# Patient Record
Sex: Female | Born: 1970 | Hispanic: Yes | Marital: Married | Smoking: Never smoker
Health system: Southern US, Community
[De-identification: ages and names within clinical notes are randomized; demographics above are authoritative.]

## PROBLEM LIST (undated history)

## (undated) DIAGNOSIS — R569 Unspecified convulsions: Secondary | ICD-10-CM

## (undated) DIAGNOSIS — C719 Malignant neoplasm of brain, unspecified: Secondary | ICD-10-CM

## (undated) DIAGNOSIS — I313 Pericardial effusion (noninflammatory): Secondary | ICD-10-CM

## (undated) DIAGNOSIS — C50919 Malignant neoplasm of unspecified site of unspecified female breast: Secondary | ICD-10-CM

## (undated) DIAGNOSIS — T8859XA Other complications of anesthesia, initial encounter: Secondary | ICD-10-CM

## (undated) DIAGNOSIS — T4145XA Adverse effect of unspecified anesthetic, initial encounter: Secondary | ICD-10-CM

## (undated) HISTORY — PX: BRAIN SURGERY: SHX531

## (undated) HISTORY — DX: Unspecified convulsions: R56.9

## (undated) HISTORY — DX: Malignant neoplasm of brain, unspecified: C71.9

## (undated) HISTORY — DX: Malignant neoplasm of unspecified site of unspecified female breast: C50.919

---

## 2007-03-21 DIAGNOSIS — C50919 Malignant neoplasm of unspecified site of unspecified female breast: Secondary | ICD-10-CM

## 2007-03-21 HISTORY — DX: Malignant neoplasm of unspecified site of unspecified female breast: C50.919

## 2008-03-20 HISTORY — PX: MASTECTOMY: SHX3

## 2010-03-20 DIAGNOSIS — C719 Malignant neoplasm of brain, unspecified: Secondary | ICD-10-CM

## 2010-03-20 HISTORY — DX: Malignant neoplasm of brain, unspecified: C71.9

## 2011-03-01 DIAGNOSIS — C7931 Secondary malignant neoplasm of brain: Secondary | ICD-10-CM | POA: Insufficient documentation

## 2014-06-25 DIAGNOSIS — C50919 Malignant neoplasm of unspecified site of unspecified female breast: Secondary | ICD-10-CM | POA: Insufficient documentation

## 2014-08-25 DIAGNOSIS — G939 Disorder of brain, unspecified: Secondary | ICD-10-CM | POA: Insufficient documentation

## 2014-10-27 DIAGNOSIS — Z8742 Personal history of other diseases of the female genital tract: Secondary | ICD-10-CM | POA: Insufficient documentation

## 2014-12-18 DIAGNOSIS — Z4001 Encounter for prophylactic removal of breast: Secondary | ICD-10-CM | POA: Insufficient documentation

## 2015-02-03 DIAGNOSIS — H5203 Hypermetropia, bilateral: Secondary | ICD-10-CM | POA: Insufficient documentation

## 2015-02-03 DIAGNOSIS — H524 Presbyopia: Secondary | ICD-10-CM | POA: Insufficient documentation

## 2015-02-03 DIAGNOSIS — H02889 Meibomian gland dysfunction of unspecified eye, unspecified eyelid: Secondary | ICD-10-CM | POA: Insufficient documentation

## 2015-02-03 DIAGNOSIS — H01009 Unspecified blepharitis unspecified eye, unspecified eyelid: Secondary | ICD-10-CM | POA: Insufficient documentation

## 2015-02-03 DIAGNOSIS — H02431 Paralytic ptosis of right eyelid: Secondary | ICD-10-CM | POA: Insufficient documentation

## 2015-03-31 DIAGNOSIS — M25512 Pain in left shoulder: Secondary | ICD-10-CM | POA: Diagnosis not present

## 2015-03-31 DIAGNOSIS — M542 Cervicalgia: Secondary | ICD-10-CM | POA: Diagnosis not present

## 2015-04-16 ENCOUNTER — Encounter: Payer: Self-pay | Admitting: Hematology and Oncology

## 2015-04-16 ENCOUNTER — Inpatient Hospital Stay: Payer: Medicare HMO

## 2015-04-16 ENCOUNTER — Inpatient Hospital Stay: Payer: Medicare HMO | Attending: Hematology and Oncology | Admitting: Hematology and Oncology

## 2015-04-16 ENCOUNTER — Other Ambulatory Visit: Payer: Self-pay

## 2015-04-16 DIAGNOSIS — M542 Cervicalgia: Secondary | ICD-10-CM | POA: Diagnosis not present

## 2015-04-16 DIAGNOSIS — Z79899 Other long term (current) drug therapy: Secondary | ICD-10-CM | POA: Insufficient documentation

## 2015-04-16 DIAGNOSIS — M25552 Pain in left hip: Secondary | ICD-10-CM | POA: Insufficient documentation

## 2015-04-16 DIAGNOSIS — M25551 Pain in right hip: Secondary | ICD-10-CM | POA: Diagnosis not present

## 2015-04-16 DIAGNOSIS — Z170 Estrogen receptor positive status [ER+]: Secondary | ICD-10-CM | POA: Insufficient documentation

## 2015-04-16 DIAGNOSIS — Z9013 Acquired absence of bilateral breasts and nipples: Secondary | ICD-10-CM | POA: Insufficient documentation

## 2015-04-16 DIAGNOSIS — C779 Secondary and unspecified malignant neoplasm of lymph node, unspecified: Secondary | ICD-10-CM | POA: Diagnosis not present

## 2015-04-16 DIAGNOSIS — C50911 Malignant neoplasm of unspecified site of right female breast: Secondary | ICD-10-CM

## 2015-04-16 DIAGNOSIS — M898X9 Other specified disorders of bone, unspecified site: Secondary | ICD-10-CM

## 2015-04-16 DIAGNOSIS — C7931 Secondary malignant neoplasm of brain: Secondary | ICD-10-CM | POA: Diagnosis not present

## 2015-04-16 DIAGNOSIS — C770 Secondary and unspecified malignant neoplasm of lymph nodes of head, face and neck: Secondary | ICD-10-CM

## 2015-04-16 DIAGNOSIS — R599 Enlarged lymph nodes, unspecified: Secondary | ICD-10-CM | POA: Insufficient documentation

## 2015-04-16 DIAGNOSIS — D0512 Intraductal carcinoma in situ of left breast: Secondary | ICD-10-CM | POA: Diagnosis not present

## 2015-04-16 DIAGNOSIS — G893 Neoplasm related pain (acute) (chronic): Secondary | ICD-10-CM | POA: Insufficient documentation

## 2015-04-16 LAB — COMPREHENSIVE METABOLIC PANEL
ALT: 17 U/L (ref 14–54)
AST: 15 U/L (ref 15–41)
Albumin: 3.8 g/dL (ref 3.5–5.0)
Alkaline Phosphatase: 119 U/L (ref 38–126)
Anion gap: 8 (ref 5–15)
BUN: 20 mg/dL (ref 6–20)
CO2: 25 mmol/L (ref 22–32)
Calcium: 9.3 mg/dL (ref 8.9–10.3)
Chloride: 99 mmol/L — ABNORMAL LOW (ref 101–111)
Creatinine, Ser: 0.62 mg/dL (ref 0.44–1.00)
GFR calc Af Amer: >60 mL/min (ref >60)
GFR calc non Af Amer: >60 mL/min (ref >60)
Glucose, Bld: 110 mg/dL — ABNORMAL HIGH (ref 65–99)
Potassium: 3.3 mmol/L — ABNORMAL LOW (ref 3.5–5.1)
Sodium: 132 mmol/L — ABNORMAL LOW (ref 135–145)
Total Bilirubin: 0.4 mg/dL (ref 0.3–1.2)
Total Protein: 8.2 g/dL — ABNORMAL HIGH (ref 6.5–8.1)

## 2015-04-16 LAB — CBC WITH DIFFERENTIAL/PLATELET
Basophils Absolute: 0.2 10*3/uL — ABNORMAL HIGH (ref 0–0.1)
Basophils Relative: 1 %
Eosinophils Absolute: 0 10*3/uL (ref 0–0.7)
Eosinophils Relative: 0 %
HCT: 37.1 % (ref 35.0–47.0)
Hemoglobin: 12.3 g/dL (ref 12.0–16.0)
Lymphocytes Relative: 6 %
Lymphs Abs: 1.3 10*3/uL (ref 1.0–3.6)
MCH: 28.3 pg (ref 26.0–34.0)
MCHC: 33 g/dL (ref 32.0–36.0)
MCV: 85.6 fL (ref 80.0–100.0)
Monocytes Absolute: 0.5 10*3/uL (ref 0.2–0.9)
Monocytes Relative: 2 %
Neutro Abs: 18.9 10*3/uL — ABNORMAL HIGH (ref 1.4–6.5)
Neutrophils Relative %: 91 %
Platelets: 445 10*3/uL — ABNORMAL HIGH (ref 150–440)
RBC: 4.33 MIL/uL (ref 3.80–5.20)
RDW: 13.2 % (ref 11.5–14.5)
WBC: 20.9 10*3/uL — ABNORMAL HIGH (ref 3.6–11.0)

## 2015-04-16 MED ORDER — PREDNISONE 20 MG PO TABS: 20.0000 mg | ORAL_TABLET | Freq: Every day | ORAL | Status: DC

## 2015-04-16 MED ORDER — TRAMADOL HCL 50 MG PO TABS: 50.0000 mg | ORAL_TABLET | Freq: Two times a day (BID) | ORAL | Status: DC | PRN

## 2015-04-16 MED ORDER — OMEPRAZOLE 20 MG PO CPDR: 20.0000 mg | DELAYED_RELEASE_CAPSULE | Freq: Every day | ORAL | Status: DC

## 2015-04-17 LAB — CANCER ANTIGEN 27.29: CA 27.29: 177.8 U/mL — ABNORMAL HIGH (ref 0.0–38.6)

## 2015-04-17 NOTE — Progress Notes (Signed)
[facility] day:  04/16/2015  Chief Complaint: Melinda Tanner is a 45 y.o. 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  HPI:  The patient presented with a lump in her right breast in 2009 while living in [place].  Mammogram and ultrasound on  08/15/2007 showed abnormality inn both breasts.  There were solid lesions at 6:00 (1.2x1.4x1.1cm), 7:00 (1.8x1x1.8cm), and 9:00 (2.1x1.9x2.2cm) in the right breast.  There was a solid lesion at 2:00 position left breast (0.5x0.5cmx0.4cm).  She underwent right breast biopsy on 09/03/2007 of the 9:00 lesion.  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   Breast MRI on 09/10/2007 revealed 3 lesions in the right breast measuring 2.1x3.2 cm, 2.8x2.7 cm, and 1.8x1.6 cm.  In the left breast, there was clumped enhancement centrally, in the mid portion of the breast, suspicious for malignancy.  She underwent left breast left breast MRI-guided biopsy on 10/02/2007 and 10/23/2007.  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  Per report, biopsies done of additional abnormal sites of the right breast showing invasive carcinoma (ER+/PR+ with Her2-). Right axillary lymph node was positive for metastatic disease.  She received  neoadjuvant Adriamycin and Cytoxan (AC) followed by Taxol and Carboplatin.  She had a severe Taxol infusion reaction and thus was switched to Abraxane.  On 07/14/2008, she underwent bilateral mastectomies followed by reconstructions.  Pathology in the right breast revealed a 1.7 cm grade III invasive ductal carcinoma grade III/III.   Zero of 11 lymph nodes on the right were positive for malignancy. 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 Deep margin was negative. Zero of 2 lymph nodes were positive for metastatic disease.  She received adjuvant radiation to  the right breast only.  She began adjuvant Tamoxifen and Zoladex.  Per patient, she could not tolerate symptoms of joint pain.  Tamoxifen was discontinued after 1-2 months.  Zoladex was continued for approximately 1.5 years.  She presented on 03/01/2011 with headaches.  Head MRI revealed metastatic disease with lesions in the left frontal area 2.4x1.7x1.8cm and right frontal area 2.2x1.8x1.7cm.   She underwent left frontal and right parietal craniotomies and excision of these lesions. Pathology of the left and right frontal lesions revealed metastatic adenocarcinoma. ER was >90%, PR5%, HER2 negative (Hercept Test DAKO).  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  Each lesion received 1600cGy.  On 03/09/2011, original breast tissue (09/03/2007) sent to [facility] testing.  Testing revealed ER+, PR+(borderline), and HER2/neu positive (different than initial testing).  She received Herceptin for 1 year beginning in 2013.  She then began Tykerb and Xeloda after completion of Herceptin (2014?).  Head MRI on 07/22/2013 was worrisome for disease progression in the right frontal lobe (1.0 x 1.1 x1.2cm).  The central potion of the lesion and surrounding T2/FLAIR hyperintensity had increased in size.  Head MRI on 01/21/2014 revealed the right frontal enhancing lesion had  increased in size with surrounding edema and local mass effect (measured 1.8 x 2.1 x 1.6 cm)  She noted neck pain.  Imaging revealed a right right supraclavicular lymph node. Biopsy on 08/06/2013 confirmed metastatic carcinoma of the right supraclavicular lymph node.  PET scan on 01/19/2014 revealed right paratracheal node 0.9 x 0.5cm (SUV 4.6) and right supraclavicular node 6x58m (SUV 3.5).  [facility] and stopped taking Tykerb and Xeloda (dosing  sub-therapeutic and probably ineffective).  Head MRI on 07/16/2014 noted the right anterior frontal enhancing mass was 2.5 x 2.0 cm  compatible with a metastatic lesion which had increased in size and had changed configuration from previous 2.0 x 1.8 cm, formerly multilobular.  On 09/04/2014, she underwent craniotomy with resection of right frontal tumor (metastatic breast cancer).  Tumor was ER 90-100%, PR 51-60%, and HER2/neu -.  On 09/29/2014 a right cervical lymph node was positive for metastatic carcinoma consistent with breast primary.  Tumor was ER 100%, PR 20%, and HER2.neu-.  She started tamoxifen in 09/2014.  She stopped tamoxifen in early 02/2015.  She was seen by Dr. Boykin Reaper in the [facility] on 03/09/2015.  She noted acute pain in her left hip that radiated to her lower back and right hip. She also noted similar pain in the right upper extremity/shoulder. She denied injury. She attributed this to Tamoxifen, as she had similar pain and discomfort when taking Tamoxifen in the past. She felt well otherwise.  She cancelled her planned oophorectomy due to insurance issues and cost.  She was seen in the ER in 03/2015 with left shoulder and arm pain.  An etiology was not found.  She received hydrocodone with Tylenol (did not help).  She has been prescribed prednisone 20 mg a day which helps.  She subsequently moved from [place] to [place].  She notes that last year there was consideration of bilateral oophorectomy.  She has a follow-up appointment with neurosurgery in 05/2015.  Symptomatically, she has excruciating pain in her hips and shoulders.  She denies any headache or neurologic dysfunnction   Past Medical History  Diagnosis Date  . Breast cancer ([place]) 2009  . Seizures ([place])   . Brain cancer ([place]) 2012    Met. from Breast    Past Surgical History  Procedure Laterality Date  . Mastectomy Bilateral 2010  . Brain surgery  2012, 2106    Family History  Problem Relation Age of Onset  . Cancer Paternal Aunt   . Cancer Paternal Uncle   . Cancer Paternal Grandfather   A paternal aunt had  breast cancer age 43, a paternal uncle had prostate cancer, and a paternal grandfather had leukemia.  Social History:  reports that she has never smoked. She does not have any smokeless tobacco history on file. She reports that she does not drink alcohol or use illicit drugs.  She is from [place].  She moved to [place] in 2015.  She moved to [place] in 04/2014.  She recently moved into the [place] area.  She has 2 children (boy and girl).  The patient is accompanied by her husband and the Spanish interpreter today.  Allergies:  Allergies  Allergen Reactions  . Taxol [Paclitaxel] Anaphylaxis  . Aspirin Swelling  . Penicillins Swelling  . Zofran [Ondansetron Hcl] Nausea And Vomiting    Current Medications: Current Outpatient Prescriptions  Medication Sig Dispense Refill  . omeprazole (PRILOSEC) 20 MG capsule Take 1 capsule (20 mg total) by mouth daily. 30 capsule 2  . potassium chloride (K-DUR) 10 MEQ tablet Take 1 tablet (10 mEq total) by mouth 2 (two) times daily. 6 tablet 0  . predniSONE (DELTASONE) 20 MG tablet Take 1 tablet (20 mg total) by mouth daily with breakfast. PRN pain 10 tablet 0  . traMADol (ULTRAM) 50 MG tablet Take 1 tablet (50 mg total) by mouth every 12 (twelve) hours as needed for severe pain. 30 tablet 0  No current facility-administered medications for this visit.    Review of Systems:  GENERAL:  Feels "ok".  No fevers, sweats or weight loss. PERFORMANCE STATUS (ECOG):  1 HEENT:  No visual changes, runny nose, sore throat, mouth sores or tenderness. Lungs: No shortness of breath or cough.  No hemoptysis. Cardiac:  No chest pain, palpitations, orthopnea, or PND. GI:  No nausea, vomiting, diarrhea, constipation, melena or hematochezia. GU:  No urgency, frequency, dysuria, or hematuria. Musculoskeletal:  Bone pain.  No muscle tenderness. Extremities:  No pain or swelling. Skin:  No rashes or skin changes. Neuro:  No headache, numbness or weakness,  balance or coordination issues. Endocrine:  No diabetes, thyroid issues, hot flashes or night sweats. Psych:  No mood changes, depression or anxiety. Pain:  No focal pain. Review of systems:  All other systems reviewed and found to be negative.  Physical Exam: There were no vitals taken for this visit. GENERAL:  Well developed, well nourished, sitting comfortably in the exam room in no acute distress.  She is tearful at times. MENTAL STATUS:  Alert and oriented to person, place and time. HEAD: Wearing a cap.  Long dark hair.  Normocephalic, atraumatic, face symmetric, no Cushingoid features. EYES:  Brown eyes.  Pupils equal round and reactive to light and accomodation.  No conjunctivitis or scleral icterus. ENT:  Oropharynx clear without lesion.  Tongue normal. Mucous membranes moist.  RESPIRATORY:  Clear to auscultation without rales, wheezes or rhonchi. CARDIOVASCULAR:  Regular rate and rhythm without murmur, rub or gallop. ABDOMEN:  Soft, non-tender, with active bowel sounds, and no hepatosplenomegaly.  No masses. BREAST:  s/p bilateral mastectomy with reconstruction.  No masses, skin changes or nipple discharge. SKIN:  No rashes, ulcers or lesions. EXTREMITIES: No edema, no skin discoloration or tenderness.  No palpable cords. LYMPH NODES: Pea sized low right cervical node.  No palpable supraclavicular, axillary or inguinal adenopathy  NEUROLOGICAL: Alert & oriented, cranial nerves II-XII intact; motor strength 5/5 throughout; sensation intact; finger to nose and RAM normal; able to walk heel to toe; negative Rhomberg; no clonus. PSYCH:  Appropriate.   Pathology: 09/03/2007: Right breast core biopsy: infiltrating duct cell carcinoma high grade ER/PR (reportedly positive) Her2 (?) 10/02/2007: Right axillary node core biopsy: Fragment with metastatic carcinoma 10/23/2007: Left breast core biopsy: DCIS high grade ER/PR (?) 07/13/2008: Right mastectomy: Invasive ductal carcinoma Grade III  Nottingham score = tubules 3+ Nuclei 3+ mitoses 2+) 1.7 cm size. No lymph invasion. Tumor cellularity 20%. [id number]. 07/13/2008: Left mastectomy: Residual ductal carcinoma in situ, high nuclear grade, deep margin negative ypTisNO(sn)MX. 10/25/2010: Abdominal and pelvic ultrasound: questionable lesion near gallbladder fossa recommend MRI follow up. 03/02/2011: Left and right frontal excisional brain biopsy: Adenocarcinoma, metastatic, NOS: ER +, PR 5% +, Her 2 NEG. 03/04/2011: [facility] testing of IHC4 residual recurrence risk score of 114 showing an 8 year recurrence reate of 57%. ER POSTIVE, PR POSITIVE, HER 2 POSTIVE (previously documented negative in 2009) cutoff of 361 patient scores 426. ki67 71%.  08/05/2013: Right breast FNA supraclavicular region: positive for metastatic carcinoma. ER/PR/HER2 not reported. 08/19/2014: Right cervical lymph node. Adenocarcinoma with features consistent with metastatic breast carcinoma. ER/PR/HER2 insufficient tissue. 09/04/2014: Repeat craniotomy, resection of right frontal tumor (metastatic breast cancer). ER 90-100% PR 51-60% HER2 - 09/29/2014: Right cervical lymph node, metastatic carcinoma c/w breast primary, ER 100% PR 20% HER2-  Imaging: 08/15/2007: Bilateral Mammogram: Dense breasts with solid lesion at 6:00, 7:00, and 9:00 of right breast, solid lesion at 2:00  position left breast BIRADS 4B. 09/10/2007: Breast MRI: Three solid irregular enhancing lesions of the right breast 2.1 x 3.2, 2.8 x 2.7, and 1.8 x 1.6 cm. Mildly enlarged enhancing nodule right axilla. Left breast clumped enhancement midportion suspicious for malignancy. 03/01/2011: Brain MRI: At least two juxtacortical, intra-axial masses with extensive surrounding vasogenic edema most consistent with intracranial metastasis. 07/22/2013: Brain MRI +/- contrast: interval increase in enhancing component on T2 FLAIR now measuring 1.0cm x 1.1 cm worrisome for progression of disease. 01/21/2014:  PET  scan: Hypermetabolic small right supraclavicular and right upper paratracheal lymph nodes suspicious for mets. Hypermetabolic lymph node in the right paratracheal upper mediastinum that measure approximately 0.9 x 0.5cm. (of note no impressions made of areas noted on brain MRI 07/22/13).  01/21/2014 MRI Brain: Right frontal enhancing lesion has shown interval increase in size with surrounding edema and local mass effect (measured 1.8x2.1x1.6, previously 1.2x1.1x1.0) 07/16/2014 Brain MRI:  Right anterior frontal enhancing mass 2.5 x 2.0 cm compatible with a metastatic lesion has increased and changed in configuration from previous 2.0 x 1.8 cm, formerly multilobular. 08/06/2014 PET scan:  Multiple bilateral FDG avid low cervical, supraclavicular, upper mediastinal, and right internal mammary lymphadenopathy, likely representing metastatic disease. A cervical node in the right lower neck should be amenable to percutaneous sampling.  12/2014 Head MRI:  Evolution of right frontal postoperative changes status post tumor resection at the vertex. Expected postoperative changes. Minimal marginal enhancement resection bed with some adjacent posterior intrinsic T1 signal again seen. Decreasing T2/FLAIR adjacent parenchymal changes.  Stable resection cavity is left posterior frontal and right parieto-occipital regions.  Labs:  Appointment on 04/16/2015  Component Date Value Ref Range Status  . WBC 04/16/2015 20.9* 3.6 - 11.0 K/uL Final  . RBC 04/16/2015 4.33  3.80 - 5.20 MIL/uL Final  . Hemoglobin 04/16/2015 12.3  12.0 - 16.0 g/dL Final  . HCT 04/16/2015 37.1  35.0 - 47.0 % Final  . MCV 04/16/2015 85.6  80.0 - 100.0 fL Final  . MCH 04/16/2015 28.3  26.0 - 34.0 pg Final  . MCHC 04/16/2015 33.0  32.0 - 36.0 g/dL Final  . RDW 04/16/2015 13.2  11.5 - 14.5 % Final  . Platelets 04/16/2015 445* 150 - 440 K/uL Final  . Neutrophils Relative % 04/16/2015 91   Final  . Neutro Abs 04/16/2015 18.9* 1.4 - 6.5 K/uL Final  .  Lymphocytes Relative 04/16/2015 6   Final  . Lymphs Abs 04/16/2015 1.3  1.0 - 3.6 K/uL Final  . Monocytes Relative 04/16/2015 2   Final  . Monocytes Absolute 04/16/2015 0.5  0.2 - 0.9 K/uL Final  . Eosinophils Relative 04/16/2015 0   Final  . Eosinophils Absolute 04/16/2015 0.0  0 - 0.7 K/uL Final  . Basophils Relative 04/16/2015 1   Final  . Basophils Absolute 04/16/2015 0.2* 0 - 0.1 K/uL Final  . Sodium 04/16/2015 132* 135 - 145 mmol/L Final  . Potassium 04/16/2015 3.3* 3.5 - 5.1 mmol/L Final  . Chloride 04/16/2015 99* 101 - 111 mmol/L Final  . CO2 04/16/2015 25  22 - 32 mmol/L Final  . Glucose, Bld 04/16/2015 110* 65 - 99 mg/dL Final  . BUN 04/16/2015 20  6 - 20 mg/dL Final  . Creatinine, Ser 04/16/2015 0.62  0.44 - 1.00 mg/dL Final  . Calcium 04/16/2015 9.3  8.9 - 10.3 mg/dL Final  . Total Protein 04/16/2015 8.2* 6.5 - 8.1 g/dL Final  . Albumin 04/16/2015 3.8  3.5 - 5.0 g/dL Final  .  AST 04/16/2015 15  15 - 41 U/L Final  . ALT 04/16/2015 17  14 - 54 U/L Final  . Alkaline Phosphatase 04/16/2015 119  38 - 126 U/L Final  . Total Bilirubin 04/16/2015 0.4  0.3 - 1.2 mg/dL Final  . GFR calc non Af Amer 04/16/2015 >60  >60 mL/min Final  . GFR calc Af Amer 04/16/2015 >60  >60 mL/min Final   Comment: (NOTE) The eGFR has been calculated using the CKD EPI equation. This calculation has not been validated in all clinical situations. eGFR's persistently <60 mL/min signify possible Chronic Kidney Disease.   . Anion gap 04/16/2015 8  5 - 15 Final  . CA 27.29 04/16/2015 177.8* 0.0 - 38.6 U/mL Final   Comment: (NOTE) [facility]ayer Centaur/ACS methodology Performed At: [facility] [address] [place], [place] [id number] Lindon Romp MD GH:[id number]     Assessment:  Melinda Tanner is a 45 y.o. 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  She initially presented in 2009 while living in [place] with multi-focal right breast cancer with positive lymph  node(s) which was ER+, PR+(low), and HER2/neu+. She received neoadjuvant chemotherapy (AC x 4 every 3 weeks followed by Taxol/Abraxane + carboplatin weekly x 12) without anti-HER2/neu treatment.    On 07/14/2008, she underwent bilateral mastectomies followed by reconstruction.  Pathology in the right breast revealed a 1.7 cm grade III invasive ductal carcinoma grade III/III.   Zero of 11 lymph nodes on the right were positive for malignancy. 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 Deep margin was negative. Zero of 2 lymph nodes were positive for metastatic disease.  She received adjuvant radiation to the right breast.  She received adjuvant tamoxifen and Zoladex.  She could not tolerate symptoms of joint pain and tamoxifen was discontinued after 1-2 months.  Zoladex was continued for approximately 1.5 years.  She was diagnosed with brain metastases in 02/2011.  Pathology revealed ER+,PR+(low), and HER2/neu-.  She underwent resection followed by Cyberknife.  On 03/09/2011, original breast tissue (09/03/2007) sent to [facility] testing.  Testing revealed ER+, PR+(borderline), and HER2/neu positive (different than initial testing).  She received Herceptin for 1 year beginning in 2013.  She then began Tykerb and Xeloda after completion of Herceptin (2014).  She was on extremely low, and probably sub-therapeutic, dosing of lapatinib + capecitabine.   She developed right supraclavicular adenopathy in 2015. She was noted to have slow disease progression in the right frontal/parietal lesion with minimal presence of systemic disease on PET scans. Capecitabine + lapatinib were discontinued in 04/2014.   She underwent repeat craniotomy with resection of brain metastasis on 09/04/2014.  Biopsy of the right supraclavicular lymph node  was ER+,PR+,HER2/neu-.  She started tamoxifen in 09/2014, but discontinued it secondary to pain in hip, back, and shoulder.  Symptomatically, she has excruciating  pain in her hips.  Exam reveals a pea sized low right cervical lymph node.  Neurologic exam is normal.  Plan: 1.  Review entire medical history, diagnosis and management of metastatic breast cancer. 2.  Obtain outside records from [facility] (not available in [place]). 3.  Phone follow-up with Dr Clyda Greener [place] [phone]). 4.  Discuss genetic testing.  Patient considering. 5.  Labs today:  CBC with diff, CMP, CA27.29. 6.  PET scan:  Restaging breast cancer. 7.  Bone scan:  Diffuse bone pain (worse in hips). 8.  Rx:  Prednisone 20 mg a day.  Plan to begin taper  at next visit. 9.  Rx:  Tramadol 50 mg po q 12 hrs prn pain. 10.  RTC after scans for MD assessment and review of work-up.   Lequita Asal, MD  04/16/2015

## 2015-04-20 ENCOUNTER — Ambulatory Visit
Admission: RE | Admit: 2015-04-20 | Discharge: 2015-04-20 | Disposition: A | Discharge status: Home / Self Care | Payer: Medicare HMO | Source: Ambulatory Visit | Attending: Hematology and Oncology | Admitting: Hematology and Oncology

## 2015-04-20 DIAGNOSIS — C771 Secondary and unspecified malignant neoplasm of intrathoracic lymph nodes: Secondary | ICD-10-CM | POA: Diagnosis not present

## 2015-04-20 DIAGNOSIS — C50919 Malignant neoplasm of unspecified site of unspecified female breast: Secondary | ICD-10-CM | POA: Insufficient documentation

## 2015-04-20 DIAGNOSIS — C7951 Secondary malignant neoplasm of bone: Secondary | ICD-10-CM | POA: Diagnosis not present

## 2015-04-20 DIAGNOSIS — N200 Calculus of kidney: Secondary | ICD-10-CM | POA: Diagnosis not present

## 2015-04-20 DIAGNOSIS — Z0189 Encounter for other specified special examinations: Secondary | ICD-10-CM | POA: Diagnosis not present

## 2015-04-20 DIAGNOSIS — C50911 Malignant neoplasm of unspecified site of right female breast: Secondary | ICD-10-CM

## 2015-04-20 LAB — GLUCOSE, CAPILLARY: Glucose-Capillary: 78 mg/dL (ref 65–99)

## 2015-04-20 MED ORDER — FLUDEOXYGLUCOSE F - 18 (FDG) INJECTION
12.3000 | Freq: Once | INTRAVENOUS | Status: AC | PRN
  Administered 2015-04-20: 12.3 via INTRAVENOUS

## 2015-04-21 ENCOUNTER — Telehealth: Payer: Self-pay

## 2015-04-21 ENCOUNTER — Other Ambulatory Visit: Payer: Self-pay

## 2015-04-21 ENCOUNTER — Other Ambulatory Visit: Payer: Self-pay | Admitting: Hematology and Oncology

## 2015-04-21 MED ORDER — POTASSIUM CHLORIDE ER 10 MEQ PO TBCR: 10.0000 meq | EXTENDED_RELEASE_TABLET | Freq: Two times a day (BID) | ORAL | Status: DC

## 2015-04-21 NOTE — Telephone Encounter (Signed)
Called pt per MD to inform pt of low potassium.  10 meq BID for 3 days called in for pt to [facility] in [place]. No other concerns noted.

## 2015-04-22 ENCOUNTER — Encounter
Admission: RE | Admit: 2015-04-22 | Discharge: 2015-04-22 | Disposition: A | Discharge status: Home / Self Care | Payer: Medicare HMO | Source: Ambulatory Visit | Attending: Hematology and Oncology | Admitting: Hematology and Oncology

## 2015-04-22 ENCOUNTER — Encounter: Admission: RE | Admit: 2015-04-22 | Payer: Medicare HMO | Source: Ambulatory Visit

## 2015-04-22 DIAGNOSIS — C50911 Malignant neoplasm of unspecified site of right female breast: Secondary | ICD-10-CM | POA: Insufficient documentation

## 2015-04-22 DIAGNOSIS — C50919 Malignant neoplasm of unspecified site of unspecified female breast: Secondary | ICD-10-CM | POA: Diagnosis not present

## 2015-04-22 DIAGNOSIS — C7951 Secondary malignant neoplasm of bone: Secondary | ICD-10-CM | POA: Insufficient documentation

## 2015-04-22 MED ORDER — TECHNETIUM TC 99M MEDRONATE IV KIT
22.6710 | PACK | Freq: Once | INTRAVENOUS | Status: AC | PRN
  Administered 2015-04-22: 22.671 via INTRAVENOUS

## 2015-04-23 ENCOUNTER — Other Ambulatory Visit: Payer: Self-pay

## 2015-04-23 ENCOUNTER — Telehealth: Payer: Self-pay

## 2015-04-23 ENCOUNTER — Inpatient Hospital Stay: Payer: Medicare HMO

## 2015-04-23 ENCOUNTER — Inpatient Hospital Stay: Payer: Medicare HMO | Attending: Hematology and Oncology | Admitting: Hematology and Oncology

## 2015-04-23 ENCOUNTER — Encounter: Payer: Self-pay | Admitting: Hematology and Oncology

## 2015-04-23 VITALS — BP 131/84 | HR 83 | Temp 98.6°F | Resp 18 | Ht 63.0 in | Wt 152.9 lb

## 2015-04-23 DIAGNOSIS — Z170 Estrogen receptor positive status [ER+]: Secondary | ICD-10-CM

## 2015-04-23 DIAGNOSIS — Z9221 Personal history of antineoplastic chemotherapy: Secondary | ICD-10-CM

## 2015-04-23 DIAGNOSIS — C7951 Secondary malignant neoplasm of bone: Secondary | ICD-10-CM | POA: Diagnosis not present

## 2015-04-23 DIAGNOSIS — Z79899 Other long term (current) drug therapy: Secondary | ICD-10-CM | POA: Diagnosis not present

## 2015-04-23 DIAGNOSIS — C7931 Secondary malignant neoplasm of brain: Secondary | ICD-10-CM | POA: Diagnosis not present

## 2015-04-23 DIAGNOSIS — N200 Calculus of kidney: Secondary | ICD-10-CM | POA: Diagnosis not present

## 2015-04-23 DIAGNOSIS — D0512 Intraductal carcinoma in situ of left breast: Secondary | ICD-10-CM

## 2015-04-23 DIAGNOSIS — C50919 Malignant neoplasm of unspecified site of unspecified female breast: Secondary | ICD-10-CM

## 2015-04-23 DIAGNOSIS — Z923 Personal history of irradiation: Secondary | ICD-10-CM | POA: Diagnosis not present

## 2015-04-23 DIAGNOSIS — C50911 Malignant neoplasm of unspecified site of right female breast: Secondary | ICD-10-CM

## 2015-04-23 DIAGNOSIS — G893 Neoplasm related pain (acute) (chronic): Secondary | ICD-10-CM

## 2015-04-23 DIAGNOSIS — Z9013 Acquired absence of bilateral breasts and nipples: Secondary | ICD-10-CM | POA: Diagnosis not present

## 2015-04-23 DIAGNOSIS — R112 Nausea with vomiting, unspecified: Secondary | ICD-10-CM | POA: Insufficient documentation

## 2015-04-23 LAB — PREGNANCY, URINE: Preg Test, Ur: NEGATIVE

## 2015-04-23 MED ORDER — OXYCODONE HCL 5 MG PO CAPS
5.0000 mg | ORAL_CAPSULE | ORAL | Status: DC | PRN
Start: 2015-04-23 — End: 2015-05-28

## 2015-04-23 NOTE — Progress Notes (Signed)
No changes since last visit. 

## 2015-04-23 NOTE — Telephone Encounter (Signed)
Pt referred to [facility] Dr. Prentice Docker at [facility] on 2/7 for eval oophorectomy.  Message left with [facility] coordinator, Izora Gala to collaborate pt care with GYN Dr. Theora Gianotti and Dr. Mike Gip.  Tentative appt for [facility] 2/15.

## 2015-04-23 NOTE — Progress Notes (Signed)
Called and spoke to pharmacist and cancelled Tramadol Rx.

## 2015-04-23 NOTE — Progress Notes (Signed)
[facility] day:  04/23/2015   Chief Complaint: Melinda Tanner is a 45 y.o. 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  HPI:  The patient was last seen in the medical oncology clinic on 04/16/2015.  At that time, she was seen for initial assessment by me.  She had presented as a benign with multifocal high grade ductal carcinoma of the right breast.  Left breast revealed high grade DCIS.  She received  neoadjuvant Adriamycin and Cytoxan (AC) followed by Taxol and Carboplatin.  She had a severe Taxol infusion reaction and thus was switched to Abraxane.  She received adjuvant radiation to the right breast only.  She began adjuvant Tamoxifen and Zoladex.  She could not tolerate symptoms of joint pain, and thus tamoxifen was discontinued after 1-2 months.  Zoladex was continued for approximately 1.5 years.  She presented with headaches in 02/2011.  Head MRI revealed 2 brain metastasis.  She underwent excision followed by Cyberknife.  Original breast tissue was retested and felt to be Her2/neu +.  She received Herceptin for 1 year beginning in 2013.  She then began Tykerb and Xeloda after completion of Herceptin (2014?).  She relocated to [place] in 04/2014 and stopped taking Tykerb and Xeloda (dosing sub-therapeutic and probably ineffective).  Head MRI on 07/16/2014 revealed progressive disease.  She underwent craniotomy with resection of right frontal tumor in 08/2014.  Tumor was ER 90-100%, PR 51-60%, and HER2/neu-.  A right cervical lymph node was positive for metastatic carcinoma consistent with breast primary in 09/2014.  Tumor was ER 100%, PR 20%, and HER2.neu-.  She started tamoxifen in 09/2014.  She stopped tamoxifen in early 02/2015.  At last visit, she had a significant amount of pain, worse with ambulation.  She was taking  prednisone with some relief.  Hydrocodone with Tylenol was not helping.  We discussed restaging studies.  She was given a short prescription of prednisone and a prescription for Tramadol for pain.  PET scan on 04/20/2015 revealed cervical and thoracic nodal metastasis.  There was multifocal osseous metastasis (left transvers T4 process, left humeral head, right iliac wing, and left side of the sacrum).  Incidental findings included right nephrolithiasis.    Bone scan on 04/22/2015 corresponded with PET-CT with metastatic lesions evident in the left humeral head, left posterior aspect of T4, left aspect of S1, and the right iliac crest.  There was no other definite foci of metastatic disease to bone.  Uptake in the skull was most suggestive of the previous right frontal craniotomy.  During the interim, she notes her pain is less.  She states that she hasn't taken a pain pill in 48 hours as she is trying not to move and is resting a lot.  She states that the Tramadol did not work.  After taking the Tramadol, she was "desperate" because the pain did not stop.  She took some left over hydrocodone with Tylenol.  It provided relief up to 4 hours.  She notes her last menstrual period was 03/19/2015.  Past Medical History  Diagnosis Date  . Breast cancer ([place]) 2009  . Seizures ([place])   . Brain cancer ([place]) 2012    Met. from Breast    Past Surgical History  Procedure Laterality Date  . Mastectomy Bilateral 2010  . Brain surgery  2012, 2106    Family  History  Problem Relation Age of Onset  . Cancer Paternal Aunt   . Cancer Paternal Uncle   . Cancer Paternal Grandfather   . Hypertension Brother   . Diabetes Paternal Grandmother   A paternal aunt had breast cancer age 41, a paternal uncle had prostate cancer, and a paternal grandfather had leukemia.  Social History:  reports that she has never smoked. She does not have any smokeless tobacco history on file. She reports that she does not drink  alcohol or use illicit drugs.  She is from [place].  She moved to [place] in 2015.  She moved to [place] in 04/2014.  She recently moved into the [place] area.  She has 2 children (boy and girl).  The patient is accompanied by her husband and the Spanish interpreter today.  Allergies:  Allergies  Allergen Reactions  . Taxol [Paclitaxel] Anaphylaxis  . Aspirin Swelling  . Penicillins Swelling  . Zofran [Ondansetron Hcl] Nausea And Vomiting    Current Medications: Current Outpatient Prescriptions  Medication Sig Dispense Refill  . HYDROcodone-acetaminophen (NORCO/VICODIN) 5-325 MG tablet Take by mouth.    Marland Kitchen omeprazole (PRILOSEC) 20 MG capsule Take 1 capsule (20 mg total) by mouth daily. 30 capsule 2  . potassium chloride (K-DUR) 10 MEQ tablet Take 1 tablet (10 mEq total) by mouth 2 (two) times daily. 6 tablet 0  . predniSONE (DELTASONE) 20 MG tablet Take 1 tablet (20 mg total) by mouth daily with breakfast. PRN pain 10 tablet 0  . oxycodone (OXY-IR) 5 MG capsule Take 1 capsule (5 mg total) by mouth every 4 (four) hours as needed. 30 capsule 0   No current facility-administered medications for this visit.    Review of Systems:  GENERAL:  Feels "much better".  No fevers, sweats or weight loss. PERFORMANCE STATUS (ECOG):  1 HEENT:  No visual changes, runny nose, sore throat, mouth sores or tenderness. Lungs: No shortness of breath or cough.  No hemoptysis. Cardiac:  No chest pain, palpitations, orthopnea, or PND. GI:  No nausea, vomiting, diarrhea, constipation, melena or hematochezia. GU:  No urgency, frequency, dysuria, or hematuria. Musculoskeletal:  Bone pain (hip, back, arm).  No muscle tenderness. Extremities:  No pain or swelling. Skin:  No rashes or skin changes. Neuro:  No headache, numbness or weakness, balance or coordination issues. Endocrine:  No diabetes, thyroid issues, hot flashes or night sweats. Psych:  No mood changes, depression or anxiety. Pain:  No  focal pain. Review of systems:  All other systems reviewed and found to be negative.  Physical Exam: Blood pressure 131/84, pulse 83, temperature 98.6 F (37 C), temperature source Tympanic, resp. rate 18, height 5' 3"  (1.6 m), weight 152 lb 14.2 oz (69.35 kg). GENERAL:  Well developed, well nourished, sitting comfortably in the exam room in no acute distress.  She is tearful at times. MENTAL STATUS:  Alert and oriented to person, place and time. HEAD: Wearing a cap.  Long dark hair.  Normocephalic, atraumatic, face symmetric, no Cushingoid features. EYES:  Brown eyes.  No conjunctivitis or scleral icterus. PSYCH:  Appropriate.   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 No lymph invasion. Tumor cellularity 20%. [id number]. 07/13/2008: Left mastectomy:  Residual ductal carcinoma in situ, high nuclear grade, deep margin negative ypTisNO(sn)MX. 10/25/2010: Abdominal and pelvic ultrasound: questionable lesion near gallbladder fossa recommend MRI follow up. 03/02/2011: Left and right frontal excisional brain biopsy: Adenocarcinoma, metastatic, NOS: ER +, PR 5% +, Her 2 NEG. 03/04/2011: [facility] testing of IHC4 residual recurrence risk score of 114 showing an 8 year recurrence reate of 57%. ER POSTIVE, PR POSITIVE, HER 2 POSTIVE (previously documented negative in 2009) cutoff of 361 patient scores 426. ki67 71%.  08/05/2013: Right breast FNA supraclavicular region: positive for metastatic carcinoma. ER/PR/HER2 not reported. 08/19/2014: Right cervical lymph node. Adenocarcinoma with features consistent with metastatic breast carcinoma. ER/PR/HER2  insufficient tissue. 09/04/2014: Repeat craniotomy, resection of right frontal tumor (metastatic breast cancer). ER 90-100% PR 51-60% HER2 - 09/29/2014: Right cervical lymph node, metastatic carcinoma c/w breast primary, ER 100% PR 20% HER2-  Imaging: 08/15/2007: Bilateral Mammogram: Dense breasts with solid lesion at 6:00, 7:00, and 9:00 of right breast, solid lesion at 2:00 position left breast BIRADS 4B. 09/10/2007: Breast MRI: Three solid irregular enhancing lesions of the right breast 2.1 x 3.2, 2.8 x 2.7, and 1.8 x 1.6 cm. Mildly enlarged enhancing nodule right axilla. Left breast clumped enhancement midportion suspicious for malignancy. 03/01/2011: Brain MRI: At least two juxtacortical, intra-axial masses with extensive surrounding vasogenic edema most consistent with intracranial metastasis. 07/22/2013: Brain MRI +/- contrast: interval increase in enhancing component on T2 FLAIR now measuring 1.0cm x 1.1 cm worrisome for progression of disease. 01/21/2014:  PET scan: Hypermetabolic small right supraclavicular and right upper paratracheal lymph nodes suspicious for mets. Hypermetabolic lymph node in the right paratracheal upper mediastinum that measure approximately 0.9 x 0.5cm. (of note no impressions made of areas noted on brain MRI 07/22/13).  01/21/2014: MRI Brain: Right frontal enhancing lesion has shown interval increase in size with surrounding edema and local mass effect (measured 1.8x2.1x1.6, previously 1.2x1.1x1.0) 07/16/2014: Brain MRI:  Right anterior frontal enhancing mass 2.5 x 2.0 cm compatible with a metastatic lesion has increased and changed in configuration from previous 2.0 x 1.8 cm, formerly multilobular. 08/06/2014: PET scan:  Multiple bilateral FDG avid low cervical, supraclavicular, upper mediastinal, and right internal mammary lymphadenopathy, likely representing metastatic disease. A cervical node in the right lower neck should be amenable to percutaneous sampling.   12/2014: Head MRI:  Evolution of right frontal postoperative changes status post tumor resection at the vertex. Expected postoperative changes. Minimal marginal enhancement resection bed with some adjacent posterior intrinsic T1 signal again seen. Decreasing T2/FLAIR adjacent parenchymal changes.  Stable resection cavity is left posterior frontal and right parieto-occipital regions.  Labs:  Appointment on 04/23/2015  Component Date Value Ref Range Status  . Preg Test, Ur 04/23/2015 NEGATIVE  NEGATIVE Final    Assessment:  Latroya Ng [name] is a 45 y.o. 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  She initially presented in 2009 while living in [place] with multi-focal right breast cancer with positive lymph node(s) which was ER+, PR+(low), and HER2/neu+. She received neoadjuvant chemotherapy (AC x 4 every 3 weeks followed by Taxol/Abraxane + carboplatin weekly x 12) without anti-HER2 treatment.    On 07/14/2008, she underwent bilateral mastectomies followed by reconstruction.  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   Zero of 11 lymph nodes on the right were positive for malignancy. 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 Deep margin was negative. Zero of 2 lymph nodes were positive for metastatic disease.  She received adjuvant radiation to the right breast.  She received adjuvant tamoxifen and Zoladex.  She could not tolerate symptoms of joint pain and tamoxifen was discontinued after 1-2 months.  Zoladex was continued for approximately 1.5 years.  She was diagnosed with brain metastases in 02/2011.  Pathology revealed ER+,PR+(low), and HER2/neu-.  She underwent resection followed by Cyberknife.  On 03/09/2011, original breast tissue (09/03/2007) sent to [facility] testing.  Testing revealed ER+, PR+(borderline), and HER2/neu positive (different than initial testing).  She received Herceptin for 1 year  beginning in 2013.  She then began Tykerb and Xeloda after completion of Herceptin (2014).  She was on extremely low, and probably sub-therapeutic, dosing of lapatinib + capecitabine.   She developed right supraclavicular adenopathy in 2015. She was noted to have slow disease progression in the right frontal/parietal lesion with minimal presence of systemic disease on PET scans. Capecitabine + lapatinib were discontinued in 04/2014.   She underwent repeat craniotomy with resection of brain metastasis on 09/04/2014.  Biopsy of the right supraclavicular lymph node  was ER+,PR+,HER2/neu-.  She started tamoxifen in 09/2014, but discontinued it secondary to pain in hip, back, and shoulder.  PET scan on 04/20/2015 revealed cervical and thoracic nodal metastasis.  There was multifocal osseous metastasis (left transvers T4 process, left humeral head, right iliac wing, and left side of the sacrum).  Incidental findings included right nephrolithiasis.    Bone scan on 04/22/2015 corresponded with PET-CT with metastatic lesions evident in the left humeral head, left posterior aspect of T4, left aspect of S1, and the right iliac crest.  There was no other definite foci of metastatic disease to bone.  Uptake in the skull was most suggestive of the previous right frontal craniotomy.  During the interim, she her pain is less as she is limiting her activity.  Tramadol was ineffective.  Hydrocodone with Tylenol helped a little.  She is on prednisone 20 mg a day.  Her last menstrual period was 03/19/2015.  Exam reveals a pea sized low right cervical lymph node.  Neurologic exam is normal.  Plan: 1.  Review interval labs and scans (bone scan and PET scan).  Discuss correlation of bone scan and PET scan.  Discuss hormone positive breast cancer (slowly growing) predominantly in bone with minimal lymph node involvement and no lung or liver disease.  Discuss pursuing hormonal therapy.  Discuss prior conversations with Dr.  Glo Herring regarding consideration of oophorectomy (previously cancelled because of insurance issues/cost).  Discuss goserelin (Zoladex) as medical oophorectomy during interim.  Discuss patient's prior symptoms associated with tamoxifen.  Discuss trial of tamoxifen.  If unable to tolerate, discuss aromatase inhibitors.  Discuss use of Xgeva for bone metastasis to decrease bone related events. 2.  Discuss pain.  Discuss trial of oxycodone 5 mg q 4-6 hours prn.  Patient to keep a pain diary.  Discuss possible Fentanyl patch. 3.  Discuss plan to taper prednisone to off.  Decrease prednisone to 10 mg a day. 4.  Rx:  oxycodone 5 mg po q 4-6 prn pain (dis: #30). 5.  PreauthDelton See, Zoladex. 6.  Urine pregnancy test today. 7.  Consult gynecology regarding consideration of oophorectomy. 8.  Discuss consideration of radiation to painful bone lesions if problematic. 9.  RTC on 04/30/2015 for MD assessment, labs (CBC with diff, CMP), review of pain diary sheets and addition of Fentanyl patch based on pain medication usage, Zoladex and Xgeva.   Lequita Asal, MD  04/23/2015, 4:00 PM

## 2015-04-26 ENCOUNTER — Encounter: Payer: Self-pay | Admitting: Hematology and Oncology

## 2015-04-29 ENCOUNTER — Other Ambulatory Visit: Payer: Self-pay | Admitting: Family Medicine

## 2015-04-29 ENCOUNTER — Telehealth: Payer: Self-pay | Admitting: *Deleted

## 2015-04-29 DIAGNOSIS — C50911 Malignant neoplasm of unspecified site of right female breast: Secondary | ICD-10-CM | POA: Diagnosis not present

## 2015-04-29 DIAGNOSIS — Z170 Estrogen receptor positive status [ER+]: Secondary | ICD-10-CM | POA: Diagnosis not present

## 2015-04-29 DIAGNOSIS — C50919 Malignant neoplasm of unspecified site of unspecified female breast: Secondary | ICD-10-CM | POA: Diagnosis not present

## 2015-04-29 DIAGNOSIS — C50912 Malignant neoplasm of unspecified site of left female breast: Secondary | ICD-10-CM | POA: Diagnosis not present

## 2015-04-29 NOTE — Telephone Encounter (Signed)
Dr. Glennon Mac called cancer center. He would like to speak to Dr. Mike Gip & gyn oncology (Dr. Theora Gianotti) (cell: [phone]).  I discussed the patient's care with MD. Dr. Glennon Mac agrees that an oophorectomy can be performed. He would appreciate gyn oncology/oncology's input whether it is necessary to remove the uterus at that time. The patient has metastatic breast cancer and had a h/o AI use.   The patient is currently scheduled to see Dr. Theora Gianotti next week for consultation. Dr. Glennon Mac is tentatively planning the oophorectomy for 2/28; however he is certainly flexible to rearrange the surgery date if the surgery needs to be coordinated with gyn oncology on a Wednesday. He states that gyn oncology could technically be on call for the surgery procedure. I will be happy to pass this information along to the providers and gyn navigator to assist in coordinating the patient's care.

## 2015-04-30 ENCOUNTER — Inpatient Hospital Stay: Payer: Medicare HMO

## 2015-04-30 ENCOUNTER — Other Ambulatory Visit: Payer: Self-pay | Admitting: Hematology and Oncology

## 2015-04-30 ENCOUNTER — Inpatient Hospital Stay (HOSPITAL_BASED_OUTPATIENT_CLINIC_OR_DEPARTMENT_OTHER): Payer: Medicare HMO | Admitting: Internal Medicine

## 2015-04-30 VITALS — BP 108/66 | HR 93 | Temp 97.0°F | Resp 18 | Ht 63.0 in | Wt 155.9 lb

## 2015-04-30 DIAGNOSIS — C50919 Malignant neoplasm of unspecified site of unspecified female breast: Secondary | ICD-10-CM

## 2015-04-30 DIAGNOSIS — Z79899 Other long term (current) drug therapy: Secondary | ICD-10-CM

## 2015-04-30 DIAGNOSIS — C7931 Secondary malignant neoplasm of brain: Secondary | ICD-10-CM

## 2015-04-30 DIAGNOSIS — Z170 Estrogen receptor positive status [ER+]: Secondary | ICD-10-CM

## 2015-04-30 DIAGNOSIS — C7951 Secondary malignant neoplasm of bone: Secondary | ICD-10-CM

## 2015-04-30 DIAGNOSIS — C50911 Malignant neoplasm of unspecified site of right female breast: Secondary | ICD-10-CM

## 2015-04-30 DIAGNOSIS — Z923 Personal history of irradiation: Secondary | ICD-10-CM

## 2015-04-30 DIAGNOSIS — Z9221 Personal history of antineoplastic chemotherapy: Secondary | ICD-10-CM

## 2015-04-30 DIAGNOSIS — D0512 Intraductal carcinoma in situ of left breast: Secondary | ICD-10-CM | POA: Diagnosis not present

## 2015-04-30 DIAGNOSIS — Z9013 Acquired absence of bilateral breasts and nipples: Secondary | ICD-10-CM

## 2015-04-30 LAB — COMPREHENSIVE METABOLIC PANEL
ALT: 13 U/L — ABNORMAL LOW (ref 14–54)
AST: 18 U/L (ref 15–41)
Albumin: 3.7 g/dL (ref 3.5–5.0)
Alkaline Phosphatase: 92 U/L (ref 38–126)
Anion gap: 5 (ref 5–15)
BUN: 19 mg/dL (ref 6–20)
CO2: 31 mmol/L (ref 22–32)
Calcium: 9.2 mg/dL (ref 8.9–10.3)
Chloride: 103 mmol/L (ref 101–111)
Creatinine, Ser: 0.75 mg/dL (ref 0.44–1.00)
GFR calc Af Amer: >60 mL/min (ref >60)
GFR calc non Af Amer: >60 mL/min (ref >60)
Glucose, Bld: 72 mg/dL (ref 65–99)
Potassium: 3.5 mmol/L (ref 3.5–5.1)
Sodium: 139 mmol/L (ref 135–145)
Total Bilirubin: 0.6 mg/dL (ref 0.3–1.2)
Total Protein: 7.5 g/dL (ref 6.5–8.1)

## 2015-04-30 LAB — CBC WITH DIFFERENTIAL/PLATELET
Basophils Absolute: 0.1 10*3/uL (ref 0–0.1)
Basophils Relative: 1 %
Eosinophils Absolute: 0.2 10*3/uL (ref 0–0.7)
Eosinophils Relative: 2 %
HCT: 35.5 % (ref 35.0–47.0)
Hemoglobin: 11.7 g/dL — ABNORMAL LOW (ref 12.0–16.0)
Lymphocytes Relative: 29 %
Lymphs Abs: 2.9 10*3/uL (ref 1.0–3.6)
MCH: 28.3 pg (ref 26.0–34.0)
MCHC: 33.1 g/dL (ref 32.0–36.0)
MCV: 85.5 fL (ref 80.0–100.0)
Monocytes Absolute: 0.7 10*3/uL (ref 0.2–0.9)
Monocytes Relative: 7 %
Neutro Abs: 6.2 10*3/uL (ref 1.4–6.5)
Neutrophils Relative %: 61 %
Platelets: 337 10*3/uL (ref 150–440)
RBC: 4.15 MIL/uL (ref 3.80–5.20)
RDW: 13.7 % (ref 11.5–14.5)
WBC: 10 10*3/uL (ref 3.6–11.0)

## 2015-04-30 MED ORDER — GOSERELIN ACETATE 3.6 MG ~~LOC~~ IMPL
3.6000 mg | DRUG_IMPLANT | Freq: Once | SUBCUTANEOUS | Status: AC
  Administered 2015-04-30: 3.6 mg via SUBCUTANEOUS
  Filled 2015-04-30: qty 3.6

## 2015-04-30 MED ORDER — DENOSUMAB 120 MG/1.7ML ~~LOC~~ SOLN
120.0000 mg | Freq: Once | SUBCUTANEOUS | Status: AC
  Administered 2015-04-30: 120 mg via SUBCUTANEOUS

## 2015-04-30 NOTE — Telephone Encounter (Addendum)
Dr. Mike Gip, since I sent this msg to you, Magda Paganini talked to Dr. Glennon Mac yesterday as well. He can be reached at [phone]. However, I know he is not there today. He just wanted to communicate and coordinate the patient's care.  I spoke to his scheduler, Izora Gala. He is out of the office until Monday or Tuesday next week. I also spoke to Dr. Theora Gianotti yesterday through secure email. She feels that an oophorectomy would be sufficient. The uterus does not need to be removed. She does not need to see the patient on Wed 2/15 as originally planned.  The patient is coming today to see Dr. Rudean Hitt today. Theadora Rama will inform the patient via interpreter that she does not need to see gyn oncology next week. We will tell the patient that nancy at [facility] will call her early next week to set up her surgery procedure. The tentative surgery date most likely will be at the end of Feb.  Dr. Loletha Grayer., Thanks for letting me assist you in coordinating this patient's care. Let me know if I can do anything else.Renita Papa, RN

## 2015-04-30 NOTE — Progress Notes (Signed)
[facility] day:  04/30/2015   Chief Complaint: Melinda Tanner is a 45 y.o. 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  HPI:  The patient was last seen in the medical oncology clinic on 04/16/2015.  At that time, she was seen for initial assessment by me.  She had presented as a benign with multifocal high grade ductal carcinoma of the right breast.  Left breast revealed high grade DCIS.  She received  neoadjuvant Adriamycin and Cytoxan (AC) followed by Taxol and Carboplatin.  She had a severe Taxol infusion reaction and thus was switched to Abraxane.  She received adjuvant radiation to the right breast only.  She began adjuvant Tamoxifen and Zoladex.  She could not tolerate symptoms of joint pain, and thus tamoxifen was discontinued after 1-2 months.  Zoladex was continued for approximately 1.5 years.  She presented with headaches in 02/2011.  Head MRI revealed 2 brain metastasis.  She underwent excision followed by Cyberknife.  Original breast tissue was retested and felt to be Her2/neu +.  She received Herceptin for 1 year beginning in 2013.  She then began Tykerb and Xeloda after completion of Herceptin (2014?).  She relocated to [place] in 04/2014 and stopped taking Tykerb and Xeloda (dosing sub-therapeutic and probably ineffective).  Head MRI on 07/16/2014 revealed progressive disease.  She underwent craniotomy with resection of right frontal tumor in 08/2014.  Tumor was ER 90-100%, PR 51-60%, and HER2/neu-.  A right cervical lymph node was positive for metastatic carcinoma consistent with breast primary in 09/2014.  Tumor was ER 100%, PR 20%, and HER2.neu-.  She started tamoxifen in 09/2014.  She stopped tamoxifen in early 02/2015.  At last visit, she had a significant amount of pain, worse with ambulation.  She was taking  prednisone with some relief.  Hydrocodone with Tylenol was not helping.  We discussed restaging studies.  She was given a short prescription of prednisone and a prescription for Tramadol for pain.  PET scan on 04/20/2015 revealed cervical and thoracic nodal metastasis.  There was multifocal osseous metastasis (left transvers T4 process, left humeral head, right iliac wing, and left side of the sacrum).  Incidental findings included right nephrolithiasis.    Bone scan on 04/22/2015 corresponded with PET-CT with metastatic lesions evident in the left humeral head, left posterior aspect of T4, left aspect of S1, and the right iliac crest.  There was no other definite foci of metastatic disease to bone.  Uptake in the skull was most suggestive of the previous right frontal craniotomy.  During the interim, she notes her pain is less.  She states that she hasn't taken a pain pill in 48 hours as she is trying not to move and is resting a lot.  She states that the Tramadol did not work.  After taking the Tramadol, she was "desperate" because the pain did not stop.  She took some left over hydrocodone with Tylenol.  It provided relief up to 4 hours.  She notes her last menstrual period was 03/19/2015.  Current status:  Patient returns to our clinic for a follow-up visit. She has been feeling significantly better since the last appointment with significant improvement of the pain control. She is able to perform the majority of activities of daily living, which is a drastic improvement, as compared to her previous status. She continues to  have occasional aches and pains and the right pelvis, lower back and right chest, but they are well controlled on oxycodone IR. She continues to take tamoxifen without any significant side effects. She denies shortness of breath, chest pain, bleeding, night sweats. She has tapered off steroids with the last dose 2 days ago.   Past Medical History  Diagnosis Date  . Breast  cancer ([place]) 2009  . Seizures ([place])   . Brain cancer ([place]) 2012    Met. from Breast    Past Surgical History  Procedure Laterality Date  . Mastectomy Bilateral 2010  . Brain surgery  2012, 2106    Family History  Problem Relation Age of Onset  . Cancer Paternal Aunt   . Cancer Paternal Uncle   . Cancer Paternal Grandfather   . Hypertension Brother   . Diabetes Paternal Grandmother   A paternal aunt had breast cancer age 88, a paternal uncle had prostate cancer, and a paternal grandfather had leukemia.  Social History:  reports that she has never smoked. She does not have any smokeless tobacco history on file. She reports that she does not drink alcohol or use illicit drugs.  She is from [place].  She moved to [place] in 2015.  She moved to [place] in 04/2014.  She recently moved into the [place] area.  She has 2 children (boy and girl).  The patient is accompanied by her husband and the Spanish interpreter today.  Allergies:  Allergies  Allergen Reactions  . Taxol [Paclitaxel] Anaphylaxis  . Aspirin Swelling  . Penicillins Swelling  . Zofran [Ondansetron Hcl] Nausea And Vomiting    Current Medications: Current Outpatient Prescriptions  Medication Sig Dispense Refill  . HYDROcodone-acetaminophen (NORCO/VICODIN) 5-325 MG tablet Take by mouth.    Marland Kitchen omeprazole (PRILOSEC) 20 MG capsule Take 1 capsule (20 mg total) by mouth daily. 30 capsule 2  . oxycodone (OXY-IR) 5 MG capsule Take 1 capsule (5 mg total) by mouth every 4 (four) hours as needed. 30 capsule 0  . potassium chloride (K-DUR) 10 MEQ tablet Take 1 tablet (10 mEq total) by mouth 2 (two) times daily. 6 tablet 0  . predniSONE (DELTASONE) 20 MG tablet Take 1 tablet (20 mg total) by mouth daily with breakfast. PRN pain 10 tablet 0   No current facility-administered medications for this visit.    Review of Systems:  GENERAL:  Feels "much better".  No fevers, sweats or weight loss. PERFORMANCE STATUS (ECOG):   1 HEENT:  No visual changes, runny nose, sore throat, mouth sores or tenderness. Lungs: No shortness of breath or cough.  No hemoptysis. Cardiac:  No chest pain, palpitations, orthopnea, or PND. GI:  No nausea, vomiting, diarrhea, constipation, melena or hematochezia. GU:  No urgency, frequency, dysuria, or hematuria. Musculoskeletal:  Bone pain (hip, back, arm).  No muscle tenderness. Extremities:  No pain or swelling. Skin:  No rashes or skin changes. Neuro:  No headache, numbness or weakness, balance or coordination issues. Endocrine:  No diabetes, thyroid issues, hot flashes or night sweats. Psych:  No mood changes, depression or anxiety. Pain:  No focal pain. Review of systems:  All other systems reviewed and found to be negative.  Physical Exam: Blood pressure 108/66, pulse 93, temperature 97 F (36.1 C), temperature source Tympanic, resp. rate 18, height 5' 3"  (1.6 m), weight 155 lb 13.8 oz (70.7 kg). GENERAL:  Well developed, well nourished Hispanic female, sitting comfortably in the exam room in no acute distress.  MENTAL STATUS:  Alert and oriented to person, place and time. HEAD: Wearing a cap.  Long dark hair.  Normocephalic, atraumatic, face symmetric, no Cushingoid features. EYES:  Brown eyes.  No conjunctivitis or scleral icterus. PSYCH:  Appropriate.   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 No lymph invasion. Tumor cellularity 20%. [id number]. 07/13/2008: Left mastectomy: Residual ductal carcinoma in situ, high nuclear grade, deep margin negative ypTisNO(sn)MX. 10/25/2010: Abdominal and pelvic ultrasound:  questionable lesion near gallbladder fossa recommend MRI follow up. 03/02/2011: Left and right frontal excisional brain biopsy: Adenocarcinoma, metastatic, NOS: ER +, PR 5% +, Her 2 NEG. 03/04/2011: [facility] testing of IHC4 residual recurrence risk score of 114 showing an 8 year recurrence reate of 57%. ER POSTIVE, PR POSITIVE, HER 2 POSTIVE (previously documented negative in 2009) cutoff of 361 patient scores 426. ki67 71%.  08/05/2013: Right breast FNA supraclavicular region: positive for metastatic carcinoma. ER/PR/HER2 not reported. 08/19/2014: Right cervical lymph node. Adenocarcinoma with features consistent with metastatic breast carcinoma. ER/PR/HER2 insufficient tissue. 09/04/2014: Repeat craniotomy, resection of right frontal tumor (metastatic breast cancer). ER 90-100% PR 51-60% HER2 - 09/29/2014: Right cervical lymph node, metastatic carcinoma c/w breast primary, ER 100% PR 20% HER2-  Imaging: 08/15/2007: Bilateral Mammogram: Dense breasts with solid lesion at 6:00, 7:00, and 9:00 of right breast, solid lesion at 2:00 position left breast BIRADS 4B. 09/10/2007: Breast MRI: Three solid irregular enhancing lesions of the right breast 2.1 x 3.2, 2.8 x 2.7, and 1.8 x 1.6 cm. Mildly enlarged enhancing nodule right axilla. Left breast clumped enhancement midportion suspicious for malignancy. 03/01/2011: Brain MRI: At least two juxtacortical, intra-axial masses with extensive surrounding vasogenic edema most consistent with intracranial metastasis. 07/22/2013: Brain MRI +/- contrast: interval increase in enhancing component on T2 FLAIR now measuring 1.0cm x 1.1 cm worrisome for progression of disease. 01/21/2014:  PET scan: Hypermetabolic small right supraclavicular and right upper paratracheal lymph nodes suspicious for mets. Hypermetabolic lymph node in the right paratracheal upper mediastinum that measure approximately 0.9 x 0.5cm. (of note no impressions made of areas noted on brain MRI  07/22/13).  01/21/2014: MRI Brain: Right frontal enhancing lesion has shown interval increase in size with surrounding edema and local mass effect (measured 1.8x2.1x1.6, previously 1.2x1.1x1.0) 07/16/2014: Brain MRI:  Right anterior frontal enhancing mass 2.5 x 2.0 cm compatible with a metastatic lesion has increased and changed in configuration from previous 2.0 x 1.8 cm, formerly multilobular. 08/06/2014: PET scan:  Multiple bilateral FDG avid low cervical, supraclavicular, upper mediastinal, and right internal mammary lymphadenopathy, likely representing metastatic disease. A cervical node in the right lower neck should be amenable to percutaneous sampling.  12/2014: Head MRI:  Evolution of right frontal postoperative changes status post tumor resection at the vertex. Expected postoperative changes. Minimal marginal enhancement resection bed with some adjacent posterior intrinsic T1 signal again seen. Decreasing T2/FLAIR adjacent parenchymal changes.  Stable resection cavity is left posterior frontal and right parieto-occipital regions.  Labs:  No visits with results within 3 Day(s) from this visit. Latest known visit with results is:  Appointment on 04/23/2015  Component Date Value Ref Range Status  . Preg Test, Ur 04/23/2015 NEGATIVE  NEGATIVE Final    Assessment:  Gunnar Bulla  [name] is a 45 y.o. 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  She initially presented in 2009 while living in [place] with multi-focal right breast cancer with positive lymph node(s) which was ER+, PR+(low), and HER2/neu+. She received neoadjuvant chemotherapy (AC x 4 every 3 weeks followed by Taxol/Abraxane + carboplatin weekly x 12) without anti-HER2 treatment.    On 07/14/2008, she underwent bilateral mastectomies followed by reconstruction.  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   Zero of 11 lymph nodes on the right were positive for malignancy.  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 Deep margin was negative. Zero of 2 lymph nodes were positive for metastatic disease.  She received adjuvant radiation to the right breast.  She received adjuvant tamoxifen and Zoladex.  She could not tolerate symptoms of joint pain and tamoxifen was discontinued after 1-2 months.  Zoladex was continued for approximately 1.5 years.  She was diagnosed with brain metastases in 02/2011.  Pathology revealed ER+,PR+(low), and HER2/neu-.  She underwent resection followed by Cyberknife.  On 03/09/2011, original breast tissue (09/03/2007) sent to [facility] testing.  Testing revealed ER+, PR+(borderline), and HER2/neu positive (different than initial testing).  She received Herceptin for 1 year beginning in 2013.  She then began Tykerb and Xeloda after completion of Herceptin (2014).  She was on extremely low, and probably sub-therapeutic, dosing of lapatinib + capecitabine.   She developed right supraclavicular adenopathy in 2015. She was noted to have slow disease progression in the right frontal/parietal lesion with minimal presence of systemic disease on PET scans. Capecitabine + lapatinib were discontinued in 04/2014.   She underwent repeat craniotomy with resection of brain metastasis on 09/04/2014.  Biopsy of the right supraclavicular lymph node  was ER+,PR+,HER2/neu-.  She started tamoxifen in 09/2014, but discontinued it secondary to pain in hip, back, and shoulder.  PET scan on 04/20/2015 revealed cervical and thoracic nodal metastasis.  There was multifocal osseous metastasis (left transvers T4 process, left humeral head, right iliac wing, and left side of the sacrum).  Incidental findings included right nephrolithiasis.    Bone scan on 04/22/2015 corresponded with PET-CT with metastatic lesions evident in the left humeral head, left posterior aspect of T4, left aspect of S1, and the right iliac crest.  There was no other definite foci of  metastatic disease to bone.  Uptake in the skull was most suggestive of the previous right frontal craniotomy.  Since the last visit patient's clinical status significantly improved. Patient had a consultation with Dr. Glennon Mac (GYN), and agreed to undergo oophorectomy.  Plan: 1. We will continue with tamoxifen. We will initiate Zoladex today. Patient will start Xgeva for bone metastasis. The patient agreed to undergo oophorectomy. 2.  Discuss pain.  Discuss trial of oxycodone 5 mg q 4-6 hours prn. Pain is significantly better controlled with initiation of tamoxifen. She will return to our clinic in 2 weeks, or sooner, if needed. She will return to weeks later for Zoladex and Xgeva.   Roxana Hires, MD  04/30/2015, 9:31 AM

## 2015-04-30 NOTE — Telephone Encounter (Signed)
  Heather,  The number for Dr. Glennon Mac does not work.  Do you have another number?  M

## 2015-04-30 NOTE — Progress Notes (Signed)
Pt reports having better or less pain since last visit in right hip.  Started taking Tamoxfin last 04/23/2015.  Stopped taking steroids Wednesday 04/28/2015 and last does of potassium was Saturday 2/4.  No concerns noted.

## 2015-04-30 NOTE — Telephone Encounter (Signed)
  Thank you Heather for everything you do!!  M

## 2015-05-05 ENCOUNTER — Encounter
Admission: RE | Admit: 2015-05-05 | Discharge: 2015-05-05 | Disposition: A | Discharge status: Home / Self Care | Payer: Medicare HMO | Source: Ambulatory Visit | Attending: Obstetrics and Gynecology | Admitting: Obstetrics and Gynecology

## 2015-05-05 ENCOUNTER — Ambulatory Visit: Payer: Medicare HMO

## 2015-05-05 DIAGNOSIS — Z170 Estrogen receptor positive status [ER+]: Secondary | ICD-10-CM | POA: Diagnosis not present

## 2015-05-05 DIAGNOSIS — C50919 Malignant neoplasm of unspecified site of unspecified female breast: Secondary | ICD-10-CM | POA: Diagnosis not present

## 2015-05-05 DIAGNOSIS — Z01812 Encounter for preprocedural laboratory examination: Secondary | ICD-10-CM | POA: Diagnosis not present

## 2015-05-05 HISTORY — DX: Adverse effect of unspecified anesthetic, initial encounter: T41.45XA

## 2015-05-05 HISTORY — DX: Other complications of anesthesia, initial encounter: T88.59XA

## 2015-05-05 LAB — COMPREHENSIVE METABOLIC PANEL
ALBUMIN: 3.8 g/dL (ref 3.5–5.0)
ALK PHOS: 85 U/L (ref 38–126)
ALT: 27 U/L (ref 14–54)
AST: 33 U/L (ref 15–41)
Anion gap: 7 (ref 5–15)
BUN: 12 mg/dL (ref 6–20)
CALCIUM: 8.7 mg/dL — AB (ref 8.9–10.3)
CO2: 26 mmol/L (ref 22–32)
Chloride: 108 mmol/L (ref 101–111)
Creatinine, Ser: 0.51 mg/dL (ref 0.44–1.00)
GFR calc non Af Amer: >60 mL/min (ref >60)
Glucose, Bld: 78 mg/dL (ref 65–99)
POTASSIUM: 3.8 mmol/L (ref 3.5–5.1)
Sodium: 141 mmol/L (ref 135–145)
Total Bilirubin: 0.7 mg/dL (ref 0.3–1.2)
Total Protein: 7.5 g/dL (ref 6.5–8.1)

## 2015-05-05 LAB — CBC
HCT: 37 % (ref 35.0–47.0)
Hemoglobin: 11.9 g/dL — ABNORMAL LOW (ref 12.0–16.0)
MCH: 27.4 pg (ref 26.0–34.0)
MCHC: 32.2 g/dL (ref 32.0–36.0)
MCV: 85.2 fL (ref 80.0–100.0)
PLATELETS: 349 10*3/uL (ref 150–440)
RBC: 4.34 MIL/uL (ref 3.80–5.20)
RDW: 13.9 % (ref 11.5–14.5)
WBC: 9.8 10*3/uL (ref 3.6–11.0)

## 2015-05-05 LAB — TYPE AND SCREEN
ABO/RH(D): A POS
ANTIBODY SCREEN: NEGATIVE

## 2015-05-05 LAB — ABO/RH: ABO/RH(D): A POS

## 2015-05-05 NOTE — Patient Instructions (Addendum)
  Your procedure is scheduled on: 05/18/15 Tues Report to Day Surgery.2nd floor medical mall To find out your arrival time please call [phone] between 1PM - 3PM on 05/17/15 Mon.  Remember: Instructions that are not followed completely may result in serious medical risk, up to and including death, or upon the discretion of your surgeon and anesthesiologist your surgery may need to be rescheduled.    __x_ 1. Do not eat food or drink liquids after midnight. No gum chewing or hard candies.     ____ 2. No Alcohol for 24 hours before or after surgery.   ____ 3. Bring all medications with you on the day of surgery if instructed.    __x__ 4. Notify your doctor if there is any change in your medical condition     (cold, fever, infections).     Do not wear jewelry, make-up, hairpins, clips or nail polish.  Do not wear lotions, powders, or perfumes. You may wear deodorant.  Do not shave 48 hours prior to surgery. Men may shave face and neck.  Do not bring valuables to the hospital.    [facility] is not responsible for any belongings or valuables.               Contacts, dentures or bridgework may not be worn into surgery.  Leave your suitcase in the car. After surgery it may be brought to your room.  For patients admitted to the hospital, discharge time is determined by your                treatment team.   Patients discharged the day of surgery will not be allowed to drive home.   Please read over the following fact sheets that you were given:      _x___ Take these medicines the morning of surgery with A SIP OF WATER:    1. omeprazole (PRILOSEC) 20 MG capsule  2. tamoxifen (NOLVADEX) 20 MG tablet  3. Oxycodone as needed  4.  5.  6.  ____ Fleet Enema (as directed)   __x__ Use CHG Soap as directed  ____ Use inhalers on the day of surgery  ____ Stop metformin 2 days prior to surgery    ____ Take 1/2 of usual insulin dose the night before surgery and none on the morning of  surgery.   ____ Stop Coumadin/Plavix/aspirin on   __x_ Stop Anti-inflammatories on 1 week before surgery may take Tylenol as needed   ____ Stop supplements until after surgery.    ____ Bring C-Pap to the hospital.

## 2015-05-05 NOTE — Pre-Procedure Instructions (Signed)
Called [facility] talked with Fraser Din RN [facility] regarding need for patient to arrive 2 hrs before surgery, med Lovenox to be given 120 min pre op.

## 2015-05-14 ENCOUNTER — Inpatient Hospital Stay: Payer: Medicare HMO

## 2015-05-14 ENCOUNTER — Other Ambulatory Visit: Payer: Self-pay

## 2015-05-14 ENCOUNTER — Inpatient Hospital Stay (HOSPITAL_BASED_OUTPATIENT_CLINIC_OR_DEPARTMENT_OTHER): Payer: Medicare HMO | Admitting: Hematology and Oncology

## 2015-05-14 ENCOUNTER — Encounter: Payer: Self-pay | Admitting: Hematology and Oncology

## 2015-05-14 VITALS — BP 114/79 | HR 92 | Temp 99.0°F | Resp 18 | Ht 63.0 in | Wt 153.9 lb

## 2015-05-14 DIAGNOSIS — C50911 Malignant neoplasm of unspecified site of right female breast: Secondary | ICD-10-CM

## 2015-05-14 DIAGNOSIS — C7931 Secondary malignant neoplasm of brain: Secondary | ICD-10-CM

## 2015-05-14 DIAGNOSIS — C7951 Secondary malignant neoplasm of bone: Secondary | ICD-10-CM

## 2015-05-14 DIAGNOSIS — Z923 Personal history of irradiation: Secondary | ICD-10-CM

## 2015-05-14 DIAGNOSIS — C50919 Malignant neoplasm of unspecified site of unspecified female breast: Secondary | ICD-10-CM

## 2015-05-14 DIAGNOSIS — Z170 Estrogen receptor positive status [ER+]: Secondary | ICD-10-CM

## 2015-05-14 DIAGNOSIS — Z9221 Personal history of antineoplastic chemotherapy: Secondary | ICD-10-CM

## 2015-05-14 DIAGNOSIS — Z79899 Other long term (current) drug therapy: Secondary | ICD-10-CM

## 2015-05-14 DIAGNOSIS — D0512 Intraductal carcinoma in situ of left breast: Secondary | ICD-10-CM | POA: Diagnosis not present

## 2015-05-14 DIAGNOSIS — Z9013 Acquired absence of bilateral breasts and nipples: Secondary | ICD-10-CM

## 2015-05-14 NOTE — Progress Notes (Signed)
[facility] day:  05/14/2015   Chief Complaint: Melinda Tanner is a 45 y.o. 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  HPI:  The patient was last seen in the medical oncology clinic on 04/23/2015.  At that time, she was seen for review of interval scans and discussion regarding direction of therapy.  PET scan on 04/20/2015 revealed cervical and thoracic nodal metastasis.  There was multifocal osseous metastasis (left transvers T4 process, left humeral head, right iliac wing, and left side of the sacrum).  Incidental findings included right nephrolithiasis.    We discussed hormone positive breast cancer (slowly growing) predominantly in bone with minimal lymph node involvement and no lung or liver disease.  We discussed pursuing hormonal therapy (tamoxifen).  We discussed goserelin (Zoladex) until planned oophorectomy.  We discussed Xgeva for bone metastasis to decrease bone related events.  We discussed a trial of oxycodone 5 mg q 4-6 hours prn. She was to keep a pain diary.  We discussed tapering her prednisone to off.   She was seen by Dr. Roxana Hires on 04/30/2015.  At that time, she received Zoladex and Xgeva.   She states that she developed a little red spot where she had the Zoladex on her abdomen.  She had agreed to oophorectomy.  She was taking her tamoxifen.    She met with Dr. Prentice Docker, gynecologist.  She is scheduled to undergo bilateral oophorectomy on 05/18/2015.  She is scheduled to follow-up with neurosurgery on 06/10/2015.  During the interim, she has done well.  She tapered off her steroids.  She is taking her tamoxifen.  She notes little pain.  She is more active.  Yesterday she took one oxycodone for pain.  Pain was a level 6-8 out of 10 which then went down to a 1-2 out of 10.  Past Medical History  Diagnosis Date   . Breast cancer ([place]) 2009  . Seizures ([place])   . Brain cancer ([place]) 2012    Met. from Breast  . Complication of anesthesia     nausea, "drops in potassium and magnesium"    Past Surgical History  Procedure Laterality Date  . Mastectomy Bilateral 2010  . Brain surgery  2012, 2106  . Cesarean section      Family History  Problem Relation Age of Onset  . Cancer Paternal Aunt   . Cancer Paternal Uncle   . Cancer Paternal Grandfather   . Hypertension Brother   . Diabetes Paternal Grandmother   A paternal aunt had breast cancer age 60, a paternal uncle had prostate cancer, and a paternal grandfather had leukemia.  Social History:  reports that she has never smoked. She does not have any smokeless tobacco history on file. She reports that she does not drink alcohol or use illicit drugs.  She is from [place].  She moved to [place] in 2015.  She moved to [place] in 04/2014.  She recently moved into the [place] area.  She has 2 children (boy and girl).  The patient is accompanied by the Spanish interpreter today.  Allergies:  Allergies  Allergen Reactions  . Taxol [Paclitaxel] Anaphylaxis  . Aspirin Swelling  . Penicillins Swelling  . Zofran [Ondansetron Hcl] Nausea And Vomiting    Current Medications: Current Outpatient Prescriptions  Medication Sig Dispense Refill  . oxycodone (OXY-IR) 5 MG capsule Take  1 capsule (5 mg total) by mouth every 4 (four) hours as needed. 30 capsule 0  . tamoxifen (NOLVADEX) 20 MG tablet Take 20 mg by mouth daily.     No current facility-administered medications for this visit.    Review of Systems:  GENERAL:  Feels good.  More active.  No fevers, sweats or weight loss. PERFORMANCE STATUS (ECOG):  1 HEENT:  No visual changes, runny nose, sore throat, mouth sores or tenderness. Lungs: No shortness of breath or cough.  No hemoptysis. Cardiac:  No chest pain, palpitations, orthopnea, or PND. GI:  No nausea, vomiting, diarrhea,  constipation, melena or hematochezia. GU:  No urgency, frequency, dysuria, or hematuria. Musculoskeletal:  Less bone pain (hip, back, arm).  No muscle tenderness. Extremities:  No pain or swelling. Skin:  Rash after Zoladex injection.  Neuro:  No headache, numbness or weakness, balance or coordination issues. Endocrine:  No diabetes, thyroid issues, hot flashes or night sweats. Psych:  No mood changes, depression or anxiety. Pain:  No focal pain. Review of systems:  All other systems reviewed and found to be negative.  Physical Exam: Blood pressure 114/79, pulse 92, temperature 99 F (37.2 C), temperature source Tympanic, resp. rate 18, height _0  (1.6 m), weight 153 lb 14.1 oz (69.8 kg), last menstrual period 03/19/2015. GENERAL:  Well developed, well nourished, sitting comfortably in the exam room in no acute distress.  She is tearful at times. MENTAL STATUS:  Alert and oriented to person, place and time. HEAD: Wearing a black and silver wrap.  Long black hair.  Normocephalic, atraumatic, face symmetric, no Cushingoid features. EYES:  Brown eyes.  Pupils equal round and reactive to light and accomodation.  No conjunctivitis or scleral icterus. ENT:  Oropharynx clear without lesion.  Tongue normal. Mucous membranes moist.  RESPIRATORY:  Clear to auscultation without rales, wheezes or rhonchi. CARDIOVASCULAR:  Regular rate and rhythm without murmur, rub or gallop. ABDOMEN:  Soft, non-tender, with active bowel sounds, and no hepatosplenomegaly.  No masses. SKIN:  2 cm hyperpigmented flat area in left lower quadrant s/p Zoladex injection.  No ulcers or lesions. EXTREMITIES: No edema, no skin discoloration or tenderness.  No palpable cords. LYMPH NODES: No palpable cervical, supraclavicular, axillary or inguinal adenopathy  NEUROLOGICAL: Unremarkable. PSYCH:  Appropriate.   Pathology: 09/03/2007: Right breast core biopsy: infiltrating duct cell carcinoma high grade ER/PR (reportedly  positive) Her2 (?) 10/02/2007: Right axillary node core biopsy: Fragment with metastatic carcinoma 10/23/2007: Left breast core biopsy: DCIS high grade ER/PR (?) 07/13/2008: Right mastectomy: Invasive ductal carcinoma Grade III Nottingham score = tubules 3+ Nuclei 3+ mitoses 2+) 1.7 cm size. No lymph invasion. Tumor cellularity 20%. [id number]. 07/13/2008: Left mastectomy: Residual ductal carcinoma in situ, high nuclear grade, deep margin negative ypTisNO(sn)MX. 10/25/2010: Abdominal and pelvic ultrasound: questionable lesion near gallbladder fossa recommend MRI follow up. 03/02/2011: Left and right frontal excisional brain biopsy: Adenocarcinoma, metastatic, NOS: ER +, PR 5% +, Her 2 NEG. 03/04/2011: [facility] testing of IHC4 residual recurrence risk score of 114 showing an 8 year recurrence reate of 57%. ER POSTIVE, PR POSITIVE, HER 2 POSTIVE (previously documented negative in 2009) cutoff of 361 patient scores 426. ki67 71%.  08/05/2013: Right breast FNA supraclavicular region: positive for metastatic carcinoma. ER/PR/HER2 not reported. 08/19/2014: Right cervical lymph node. Adenocarcinoma with features consistent with metastatic breast carcinoma. ER/PR/HER2 insufficient tissue. 09/04/2014: Repeat craniotomy, resection of right frontal tumor (metastatic breast cancer). ER 90-100% PR 51-60% HER2 - 09/29/2014: Right cervical lymph node, metastatic carcinoma  c/w breast primary, ER 100% PR 20% HER2-  Imaging: 08/15/2007: Bilateral Mammogram: Dense breasts with solid lesion at 6:00, 7:00, and 9:00 of right breast, solid lesion at 2:00 position left breast BIRADS 4B. 09/10/2007: Breast MRI: Three solid irregular enhancing lesions of the right breast 2.1 x 3.2, 2.8 x 2.7, and 1.8 x 1.6 cm. Mildly enlarged enhancing nodule right axilla. Left breast clumped enhancement midportion suspicious for malignancy. 03/01/2011: Brain MRI: At least two juxtacortical, intra-axial masses with extensive surrounding  vasogenic edema most consistent with intracranial metastasis. 07/22/2013: Brain MRI +/- contrast: interval increase in enhancing component on T2 FLAIR now measuring 1.0cm x 1.1 cm worrisome for progression of disease. 01/21/2014:  PET scan: Hypermetabolic small right supraclavicular and right upper paratracheal lymph nodes suspicious for mets. Hypermetabolic lymph node in the right paratracheal upper mediastinum that measure approximately 0.9 x 0.5cm. (of note no impressions made of areas noted on brain MRI 07/22/13).  01/21/2014: MRI Brain: Right frontal enhancing lesion has shown interval increase in size with surrounding edema and local mass effect (measured 1.8x2.1x1.6, previously 1.2x1.1x1.0) 07/16/2014: Brain MRI:  Right anterior frontal enhancing mass 2.5 x 2.0 cm compatible with a metastatic lesion has increased and changed in configuration from previous 2.0 x 1.8 cm, formerly multilobular. 08/06/2014: PET scan:  Multiple bilateral FDG avid low cervical, supraclavicular, upper mediastinal, and right internal mammary lymphadenopathy, likely representing metastatic disease. A cervical node in the right lower neck should be amenable to percutaneous sampling.  12/2014: Head MRI:  Evolution of right frontal postoperative changes status post tumor resection at the vertex. Expected postoperative changes. Minimal marginal enhancement resection bed with some adjacent posterior intrinsic T1 signal again seen. Decreasing T2/FLAIR adjacent parenchymal changes.  Stable resection cavity is left posterior frontal and right parieto-occipital regions.  Labs:  No visits with results within 3 Day(s) from this visit. Latest known visit with results is:  Hospital Outpatient Visit on 05/05/2015  Component Date Value Ref Range Status  . WBC 05/05/2015 9.8  3.6 - 11.0 K/uL Final  . RBC 05/05/2015 4.34  3.80 - 5.20 MIL/uL Final  . Hemoglobin 05/05/2015 11.9* 12.0 - 16.0 g/dL Final  . HCT 05/05/2015 37.0  35.0 - 47.0 %  Final  . MCV 05/05/2015 85.2  80.0 - 100.0 fL Final  . MCH 05/05/2015 27.4  26.0 - 34.0 pg Final  . MCHC 05/05/2015 32.2  32.0 - 36.0 g/dL Final  . RDW 05/05/2015 13.9  11.5 - 14.5 % Final  . Platelets 05/05/2015 349  150 - 440 K/uL Final  . Sodium 05/05/2015 141  135 - 145 mmol/L Final  . Potassium 05/05/2015 3.8  3.5 - 5.1 mmol/L Final  . Chloride 05/05/2015 108  101 - 111 mmol/L Final  . CO2 05/05/2015 26  22 - 32 mmol/L Final  . Glucose, Bld 05/05/2015 78  65 - 99 mg/dL Final  . BUN 05/05/2015 12  6 - 20 mg/dL Final  . Creatinine, Ser 05/05/2015 0.51  0.44 - 1.00 mg/dL Final  . Calcium 05/05/2015 8.7* 8.9 - 10.3 mg/dL Final  . Total Protein 05/05/2015 7.5  6.5 - 8.1 g/dL Final  . Albumin 05/05/2015 3.8  3.5 - 5.0 g/dL Final  . AST 05/05/2015 33  15 - 41 U/L Final  . ALT 05/05/2015 27  14 - 54 U/L Final  . Alkaline Phosphatase 05/05/2015 85  38 - 126 U/L Final  . Total Bilirubin 05/05/2015 0.7  0.3 - 1.2 mg/dL Final  . GFR calc non Af  Amer 05/05/2015 >60  >60 mL/min Final  . GFR calc Af Amer 05/05/2015 >60  >60 mL/min Final   Comment: (NOTE) The eGFR has been calculated using the CKD EPI equation. This calculation has not been validated in all clinical situations. eGFR's persistently <60 mL/min signify possible Chronic Kidney Disease.   . Anion gap 05/05/2015 7  5 - 15 Final  . ABO/RH(D) 05/05/2015 A POS   Final  . Antibody Screen 05/05/2015 NEG   Final  . Sample Expiration 05/05/2015 05/19/2015   Final  . Extend sample reason 05/05/2015 NO TRANSFUSIONS OR PREGNANCY IN THE PAST 3 MONTHS   Final  . ABO/RH(D) 05/05/2015 A POS   Final    Assessment:  Melinda Tanner is a 45 y.o. 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  She initially presented in 2009 while living in [place] with multi-focal right breast cancer with positive lymph node(s) which was ER+, PR+(low), and HER2/neu+. She received neoadjuvant chemotherapy (AC x 4 every 3 weeks followed by  Taxol/Abraxane + carboplatin weekly x 12) without anti-HER2 treatment.    On 07/14/2008, she underwent bilateral mastectomies followed by reconstruction.  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   Zero of 11 lymph nodes on the right were positive for malignancy. 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 Deep margin was negative. Zero of 2 lymph nodes were positive for metastatic disease.  She received adjuvant radiation to the right breast.  She received adjuvant tamoxifen and Zoladex.  She could not tolerate symptoms of joint pain and tamoxifen was discontinued after 1-2 months.  Zoladex was continued for approximately 1.5 years.  She was diagnosed with brain metastases in 02/2011.  Pathology revealed ER+,PR+(low), and HER2/neu-.  She underwent resection followed by [facility].  On 03/09/2011, original breast tissue (09/03/2007) sent to [facility] testing.  Testing revealed ER+, PR+(borderline), and HER2/neu positive (different than initial testing).  She received Herceptin for 1 year beginning in 2013.  She then began Tykerb and Xeloda after completion of Herceptin (2014).  She was on extremely low, and probably sub-therapeutic, dosing of lapatinib + capecitabine.   She developed right supraclavicular adenopathy in 2015. She was noted to have slow disease progression in the right frontal/parietal lesion with minimal presence of systemic disease on PET scans. Capecitabine + lapatinib were discontinued in 04/2014.   She underwent repeat craniotomy with resection of brain metastasis on 09/04/2014.  Biopsy of the right supraclavicular lymph node  was ER+,PR+,HER2/neu-.  She started tamoxifen in 09/2014, but discontinued it secondary to pain in hip, back, and shoulder.  PET scan on 04/20/2015 revealed cervical and thoracic nodal metastasis.  There was multifocal osseous metastasis (left transvers T4 process, left humeral head, right iliac wing,  and left side of the sacrum).  Incidental findings included right nephrolithiasis.    Bone scan on 04/22/2015 corresponded with PET-CT with metastatic lesions evident in the left humeral head, left posterior aspect of T4, left aspect of S1, and the right iliac crest.  There was no other definite foci of metastatic disease to bone.  Uptake in the skull was most suggestive of the previous right frontal craniotomy.  She restarted tamoxifen on 04/23/2015.  She received Zoladex and Xgeva on 04/30/2015.  She is scheduled for bilateral oophorectomy on 05/18/2015.  Symptomatically, she is doing well.  She is more active and has minimal pain.  Plan: 1.  Discuss plan for bilateral oophorectomy.  Discuss discontinuation of Zoladex.  Discuss continuation of tamoxifen.  Discuss hormonal therapy in general.  Discuss aromatase inhibitors.  Discuss baseline bone density study. 2.  Schedule baseline bone density study. 3.  Continue tamoxifen. 4.  RTC on 05/28/2015 for MD assessment, labs (CBC with diff, CMP, CA27.29), and Xgeva.   Lequita Asal, MD  05/14/2015, 1:52 PM

## 2015-05-18 ENCOUNTER — Ambulatory Visit
Admission: RE | Admit: 2015-05-18 | Discharge: 2015-05-18 | Disposition: A | Discharge status: Home / Self Care | Payer: Medicare HMO | Source: Ambulatory Visit | Attending: Obstetrics and Gynecology | Admitting: Obstetrics and Gynecology

## 2015-05-18 ENCOUNTER — Ambulatory Visit: Payer: Medicare HMO | Admitting: Anesthesiology

## 2015-05-18 ENCOUNTER — Encounter
Admission: RE | Disposition: A | Discharge status: Home / Self Care | Payer: Self-pay | Source: Ambulatory Visit | Attending: Obstetrics and Gynecology

## 2015-05-18 DIAGNOSIS — N736 Female pelvic peritoneal adhesions (postinfective): Secondary | ICD-10-CM | POA: Insufficient documentation

## 2015-05-18 DIAGNOSIS — Z170 Estrogen receptor positive status [ER+]: Secondary | ICD-10-CM | POA: Diagnosis not present

## 2015-05-18 DIAGNOSIS — Z888 Allergy status to other drugs, medicaments and biological substances status: Secondary | ICD-10-CM | POA: Insufficient documentation

## 2015-05-18 DIAGNOSIS — Z886 Allergy status to analgesic agent status: Secondary | ICD-10-CM | POA: Insufficient documentation

## 2015-05-18 DIAGNOSIS — N8302 Follicular cyst of left ovary: Secondary | ICD-10-CM | POA: Diagnosis not present

## 2015-05-18 DIAGNOSIS — C771 Secondary and unspecified malignant neoplasm of intrathoracic lymph nodes: Secondary | ICD-10-CM | POA: Insufficient documentation

## 2015-05-18 DIAGNOSIS — Q504 Embryonic cyst of fallopian tube: Secondary | ICD-10-CM | POA: Diagnosis not present

## 2015-05-18 DIAGNOSIS — Z4002 Encounter for prophylactic removal of ovary: Secondary | ICD-10-CM | POA: Diagnosis not present

## 2015-05-18 DIAGNOSIS — C770 Secondary and unspecified malignant neoplasm of lymph nodes of head, face and neck: Secondary | ICD-10-CM | POA: Insufficient documentation

## 2015-05-18 DIAGNOSIS — C7951 Secondary malignant neoplasm of bone: Secondary | ICD-10-CM | POA: Insufficient documentation

## 2015-05-18 DIAGNOSIS — Z9013 Acquired absence of bilateral breasts and nipples: Secondary | ICD-10-CM | POA: Diagnosis not present

## 2015-05-18 DIAGNOSIS — C50919 Malignant neoplasm of unspecified site of unspecified female breast: Secondary | ICD-10-CM | POA: Diagnosis not present

## 2015-05-18 DIAGNOSIS — C801 Malignant (primary) neoplasm, unspecified: Secondary | ICD-10-CM | POA: Diagnosis not present

## 2015-05-18 DIAGNOSIS — Z880 Allergy status to penicillin: Secondary | ICD-10-CM | POA: Insufficient documentation

## 2015-05-18 DIAGNOSIS — Z79899 Other long term (current) drug therapy: Secondary | ICD-10-CM | POA: Diagnosis not present

## 2015-05-18 DIAGNOSIS — N8301 Follicular cyst of right ovary: Secondary | ICD-10-CM | POA: Diagnosis not present

## 2015-05-18 DIAGNOSIS — C7931 Secondary malignant neoplasm of brain: Secondary | ICD-10-CM | POA: Diagnosis not present

## 2015-05-18 HISTORY — PX: LAPAROSCOPIC BILATERAL SALPINGO OOPHERECTOMY: SHX5890

## 2015-05-18 LAB — POCT PREGNANCY, URINE: PREG TEST UR: NEGATIVE

## 2015-05-18 SURGERY — SALPINGO-OOPHORECTOMY, BILATERAL, LAPAROSCOPIC
Anesthesia: General | Laterality: Bilateral | Wound class: Clean Contaminated

## 2015-05-18 MED ORDER — OXYCODONE HCL 5 MG PO TABS: 5.0000 mg | ORAL_TABLET | ORAL | Status: DC | PRN

## 2015-05-18 MED ORDER — MIDAZOLAM HCL 5 MG/5ML IJ SOLN
INTRAMUSCULAR | Status: DC | PRN
  Administered 2015-05-18 (×2): 1 mg via INTRAVENOUS
  Administered 2015-05-18: 2 mg via INTRAVENOUS

## 2015-05-18 MED ORDER — BUPIVACAINE HCL (PF) 0.5 % IJ SOLN
INTRAMUSCULAR | Status: AC
  Filled 2015-05-18: qty 30

## 2015-05-18 MED ORDER — LACTATED RINGERS IV SOLN
INTRAVENOUS | Status: DC
  Administered 2015-05-18: 13:00:00 via INTRAVENOUS

## 2015-05-18 MED ORDER — FENTANYL CITRATE (PF) 100 MCG/2ML IJ SOLN: 25.0000 ug | INTRAMUSCULAR | Status: DC | PRN

## 2015-05-18 MED ORDER — METOCLOPRAMIDE HCL 5 MG/ML IJ SOLN
INTRAMUSCULAR | Status: DC | PRN
  Administered 2015-05-18: 10 mg via INTRAVENOUS

## 2015-05-18 MED ORDER — ROCURONIUM BROMIDE 100 MG/10ML IV SOLN
INTRAVENOUS | Status: DC | PRN
  Administered 2015-05-18: 40 mg via INTRAVENOUS
  Administered 2015-05-18: 10 mg via INTRAVENOUS

## 2015-05-18 MED ORDER — PROMETHAZINE HCL 12.5 MG PO TABS: 12.5000 mg | ORAL_TABLET | ORAL | Status: DC | PRN

## 2015-05-18 MED ORDER — SCOPOLAMINE 1 MG/3DAYS TD PT72
1.0000 | MEDICATED_PATCH | TRANSDERMAL | Status: DC
  Administered 2015-05-18: 1.5 mg via TRANSDERMAL

## 2015-05-18 MED ORDER — PROPOFOL 10 MG/ML IV BOLUS
INTRAVENOUS | Status: DC | PRN
  Administered 2015-05-18: 150 mg via INTRAVENOUS

## 2015-05-18 MED ORDER — OXYCODONE HCL 5 MG/5ML PO SOLN: 5.0000 mg | Freq: Once | ORAL | Status: DC | PRN

## 2015-05-18 MED ORDER — DEXAMETHASONE SODIUM PHOSPHATE 10 MG/ML IJ SOLN
INTRAMUSCULAR | Status: DC | PRN
  Administered 2015-05-18: 5 mg via INTRAVENOUS

## 2015-05-18 MED ORDER — NEOSTIGMINE METHYLSULFATE 10 MG/10ML IV SOLN
INTRAVENOUS | Status: DC | PRN
  Administered 2015-05-18: 4 mg via INTRAVENOUS

## 2015-05-18 MED ORDER — FENTANYL CITRATE (PF) 250 MCG/5ML IJ SOLN
INTRAMUSCULAR | Status: DC | PRN
  Administered 2015-05-18 (×4): 50 ug via INTRAVENOUS

## 2015-05-18 MED ORDER — PROPOFOL 500 MG/50ML IV EMUL
INTRAVENOUS | Status: DC | PRN
  Administered 2015-05-18: 190 ug/kg/min via INTRAVENOUS

## 2015-05-18 MED ORDER — ENOXAPARIN SODIUM 40 MG/0.4ML ~~LOC~~ SOLN
40.0000 mg | SUBCUTANEOUS | Status: AC
  Administered 2015-05-18: 40 mg via SUBCUTANEOUS
  Filled 2015-05-18: qty 0.4

## 2015-05-18 MED ORDER — BUPIVACAINE HCL (PF) 0.5 % IJ SOLN
INTRAMUSCULAR | Status: DC | PRN
  Administered 2015-05-18: 7 mL

## 2015-05-18 MED ORDER — PROMETHAZINE HCL 25 MG/ML IJ SOLN
6.2500 mg | INTRAMUSCULAR | Status: DC | PRN
  Administered 2015-05-18: 6.25 mg via INTRAVENOUS

## 2015-05-18 MED ORDER — PROMETHAZINE HCL 25 MG/ML IJ SOLN
INTRAMUSCULAR | Status: AC
  Administered 2015-05-18: 6.25 mg via INTRAVENOUS
  Filled 2015-05-18: qty 1

## 2015-05-18 MED ORDER — ACETAMINOPHEN 10 MG/ML IV SOLN
INTRAVENOUS | Status: AC
  Filled 2015-05-18: qty 100

## 2015-05-18 MED ORDER — LACTATED RINGERS IV SOLN
INTRAVENOUS | Status: DC
  Administered 2015-05-18: 12:00:00 via INTRAVENOUS

## 2015-05-18 MED ORDER — SCOPOLAMINE 1 MG/3DAYS TD PT72
MEDICATED_PATCH | TRANSDERMAL | Status: DC
Start: 2015-05-18 — End: 2015-05-18
  Filled 2015-05-18: qty 1

## 2015-05-18 MED ORDER — SODIUM CHLORIDE 0.9 % IJ SOLN
INTRAMUSCULAR | Status: AC
  Filled 2015-05-18: qty 10

## 2015-05-18 MED ORDER — OXYCODONE HCL 5 MG PO TABS: 5.0000 mg | ORAL_TABLET | Freq: Once | ORAL | Status: DC | PRN

## 2015-05-18 MED ORDER — GLYCOPYRROLATE 0.2 MG/ML IJ SOLN
INTRAMUSCULAR | Status: DC | PRN
  Administered 2015-05-18: 0.6 mg via INTRAVENOUS

## 2015-05-18 SURGICAL SUPPLY — 44 items
BAG URO DRAIN 2000ML W/SPOUT (MISCELLANEOUS) ×2 IMPLANT
BLADE SURG SZ11 CARB STEEL (BLADE) ×2 IMPLANT
CANISTER SUCT 1200ML W/VALVE (MISCELLANEOUS) ×2 IMPLANT
CATH FOL 2WAY LX 16X5 (CATHETERS) ×2 IMPLANT
CATH ROBINSON RED A/P 16FR (CATHETERS) ×2 IMPLANT
CHLORAPREP W/TINT 26ML (MISCELLANEOUS) ×2 IMPLANT
DRAPE LEGGINS SURG 28X43 STRL (DRAPES) ×2 IMPLANT
DRAPE UNDER BUTTOCK W/FLU (DRAPES) ×2 IMPLANT
GLOVE BIO SURGEON STRL SZ7 (GLOVE) ×2 IMPLANT
GLOVE BIOGEL PI IND STRL 7.5 (GLOVE) ×6 IMPLANT
GLOVE BIOGEL PI INDICATOR 7.5 (GLOVE) ×6
GOWN STRL REUS W/ TWL LRG LVL3 (GOWN DISPOSABLE) ×3 IMPLANT
GOWN STRL REUS W/TWL LRG LVL3 (GOWN DISPOSABLE) ×3
GRASPER SUT TROCAR 14GX15 (MISCELLANEOUS) ×2 IMPLANT
IRRIGATION STRYKERFLOW (MISCELLANEOUS) ×1 IMPLANT
IRRIGATOR STRYKERFLOW (MISCELLANEOUS) ×2
IV LACTATED RINGERS 1000ML (IV SOLUTION) ×2 IMPLANT
KIT RM TURNOVER CYSTO AR (KITS) ×2 IMPLANT
LABEL OR SOLS (LABEL) ×2 IMPLANT
LIGASURE BLUNT 5MM 37CM (INSTRUMENTS) ×2 IMPLANT
LIQUID BAND (GAUZE/BANDAGES/DRESSINGS) ×2 IMPLANT
NEEDLE HYPO 25GX1X1/2 BEV (NEEDLE) ×2 IMPLANT
NS IRRIG 500ML POUR BTL (IV SOLUTION) ×2 IMPLANT
PACK LAP CHOLECYSTECTOMY (MISCELLANEOUS) ×2 IMPLANT
PAD OB MATERNITY 4.3X12.25 (PERSONAL CARE ITEMS) ×2 IMPLANT
PAD PREP 24X41 OB/GYN DISP (PERSONAL CARE ITEMS) ×2 IMPLANT
POUCH ENDO CATCH 10MM SPEC (MISCELLANEOUS) ×2 IMPLANT
SCISSORS METZENBAUM CVD 33 (INSTRUMENTS) ×2 IMPLANT
SHEARS HARMONIC ACE PLUS 36CM (ENDOMECHANICALS) IMPLANT
SLEEVE ENDOPATH XCEL 5M (ENDOMECHANICALS) ×2 IMPLANT
SOL PREP PVP 2OZ (MISCELLANEOUS) ×2
SOLUTION PREP PVP 2OZ (MISCELLANEOUS) ×1 IMPLANT
SURGILUBE 2OZ TUBE FLIPTOP (MISCELLANEOUS) ×2 IMPLANT
SUT MNCRL 3-0 UNDYED SH (SUTURE) ×1 IMPLANT
SUT MNCRL AB 3-0 PS2 27 (SUTURE) ×2 IMPLANT
SUT MONOCRYL 3-0 UNDYED (SUTURE) ×1
SUT VIC AB 0 CT1 36 (SUTURE) ×2 IMPLANT
SUT VIC AB 0 CT2 27 (SUTURE) ×2 IMPLANT
SUT VIC AB 2-0 UR6 27 (SUTURE) ×2 IMPLANT
SUT VIC AB 4-0 PS2 18 (SUTURE) ×4 IMPLANT
SYR 50ML LL SCALE MARK (SYRINGE) ×2 IMPLANT
TROCAR ENDO BLADELESS 11MM (ENDOMECHANICALS) ×2 IMPLANT
TROCAR XCEL NON-BLD 5MMX100MML (ENDOMECHANICALS) ×2 IMPLANT
TUBING INSUFFLATOR HI FLOW (MISCELLANEOUS) ×2 IMPLANT

## 2015-05-18 NOTE — Anesthesia Postprocedure Evaluation (Signed)
Anesthesia Post Note  Patient: Melinda Tanner  Procedure(s) Performed: Procedure(s) (LRB): LAPAROSCOPIC BILATERAL SALPINGO OOPHORECTOMY (Bilateral)  Patient location during evaluation: PACU Anesthesia Type: General Level of consciousness: awake and alert Pain management: pain level controlled Vital Signs Assessment: post-procedure vital signs reviewed and stable Respiratory status: spontaneous breathing and respiratory function stable Cardiovascular status: stable Anesthetic complications: no    Last Vitals:  Filed Vitals:   05/18/15 1458 05/18/15 1500  BP: 114/71 114/71  Pulse: 92 86  Temp: 36.1 C 36.1 C  Resp: 11 17    Last Pain:  Filed Vitals:   05/18/15 1505  PainSc: 2                  Takahiro Godinho K

## 2015-05-18 NOTE — Anesthesia Preprocedure Evaluation (Signed)
Anesthesia Evaluation  Patient identified by MRN, date of birth, ID band Patient awake    Reviewed: Allergy & Precautions, H&P , NPO status , Patient's Chart, lab work & pertinent test results  History of Anesthesia Complications (+) PONV and history of anesthetic complications  Airway Mallampati: II  TM Distance: >3 FB Neck ROM: full    Dental  (+) Poor Dentition, Chipped, Caps   Pulmonary neg pulmonary ROS, neg shortness of breath,    Pulmonary exam normal breath sounds clear to auscultation       Cardiovascular Exercise Tolerance: Good (-) angina(-) Past MI negative cardio ROS Normal cardiovascular exam Rhythm:regular Rate:Normal     Neuro/Psych Seizures -,  negative psych ROS   GI/Hepatic negative GI ROS, Neg liver ROS,   Endo/Other  negative endocrine ROS  Renal/GU negative Renal ROS  negative genitourinary   Musculoskeletal   Abdominal   Peds  Hematology negative hematology ROS (+)   Anesthesia Other Findings Past Medical History:   Breast cancer ([place])                             2009         Seizures ([place])                                               Brain cancer ([place])                              2012           Comment:Met. from Breast   Complication of anesthesia                                     Comment:nausea, "drops in potassium and magnesium"  Past Surgical History:   MASTECTOMY                                      Bilateral 2010         BRAIN SURGERY                                    2012, 2106   CESAREAN SECTION                                             BMI    Body Mass Index   27.10 kg/m 2      Reproductive/Obstetrics negative OB ROS                             Anesthesia Physical Anesthesia Plan  ASA: III  Anesthesia Plan: General ETT   Post-op Pain Management:    Induction: Intravenous  Airway Management Planned:   Additional Equipment:    Intra-op Plan:   Post-operative Plan:   Informed Consent: I have reviewed the patients History and Physical, chart, labs and discussed the procedure including  the risks, benefits and alternatives for the proposed anesthesia with the patient or authorized representative who has indicated his/her understanding and acceptance.   Dental Advisory Given  Plan Discussed with: Anesthesiologist, CRNA and Surgeon  Anesthesia Plan Comments:         Anesthesia Quick Evaluation

## 2015-05-18 NOTE — H&P (Signed)
History and Physical Interval Note:  Melinda Tanner  has presented today for surgery, with the diagnosis of ESTROGEN AND PROGESTERONE RECEPTOR POSITIVE BREAST CANCER  The various methods of treatment have been discussed with the patient and family. After consideration of risks, benefits and other options for treatment, the patient has consented to  Procedure(s): LAPAROSCOPIC BILATERAL SALPINGO OOPHORECTOMY (Bilateral) as a surgical intervention .  The patient's history has been reviewed, patient examined, no change in status, stable for surgery.  I have reviewed the patient's chart and labs.  Questions were answered to the patient's satisfaction.  The consents are reviewed and the patient agrees to proceed with the procedure.  Will Bonnet, MD 05/18/2015 12:54 PM

## 2015-05-18 NOTE — Discharge Instructions (Signed)

## 2015-05-18 NOTE — Op Note (Signed)
Op Note Laparoscopic Bilateral Salpingo-oophorectomy  Pre-Op Diagnosis: Metastatic breast cancer that is Estrogen and progesterone receptor positive  Post-Op Diagnosis: Metastatic breast cancer that is Estrogen and progesterone receptor positive  Procedures:  Laparoscopic bilateral salpingo-oophorectomy  Primary Surgeon: Prentice Docker, MD   Assistant Surgeon: Malachy Mood, MD  EBL: 10 ml   IVF: 800 mL   Urine output: 400 mL  Specimens:  1) Right ovary, fallopian tube, and ovarian cyst 2) left ovary, fallopian tube and paraovarian cysts  Drains: None  Complications: None   Disposition: PACU   Condition: Stable   Findings:  1) Right ovary with several centimeter cyst 2) normal appearing right fallopian tube 3) adhesion of left ovary/fallopian tube to left pelvic sidewall 4) several small left para-ovarian cysts 5) normal appearing left ovary and fallopian tube  Indication: The patient is a 45 y.o. female with a long history of breast cancer that is now metastatic. The cancer is estrogen and progesterone receptor positive.  The patient was taken to the OR to have her ovaries removed given this.  Procedure Summary:  The patient was taken to the operating room where general anesthesia (total IV anesthesia) was administered and found to be adequate. She was placed in the supine position and prepped and draped in usual sterile fashion. After a timeout was called an indwelling catheter was placed in her bladder.   Attention was turned to the abdomen where after injection of local anesthetic, a 5 mm infraumbilical incision was made with the scalpel. Entry into the abdomen was obtained via Optiview trocar technique (a blunt entry technique with camera visualization through the obturator upon entry). Verification of entry into the abdomen was obtained using opening pressures. The abdomen was insufflated with CO2. The camera was introduced through the trocar with verification  of atraumatic entry.  A left 65m port site was created under direct intra-abdominal camera visualization without difficulty.  The same procedure was carried out on the right lower quadrant with a 543mcamera port.  Care was taken to identify the inferior epigastric vessels prior to placement of the trocars.    Attention was turned to the right ovary and fallopian tube.  The right ureter was identified and found to be well away from the operative area of interest.  The infundibulopelvic ligament was isolated and ligated using the ligasure device.  In a lateral-to-medial fashion the fallopian tube and the rest of the ovarian attachments were ligated with the Ligasure device.  The same procedure was performed on the left side after lysis of filmy adhesions of the fallopian tube and ovary to the epiploica of the large intestine, as well as the uterus.  The left ureter was similarly identified prior to transecting the IP ligament on the left.  Both ureters were visualized after removal of the ovaries and fallopian tubes and were well away from the surgical area.  The specimens were removed separately through an endoscopic pouch through the 1125mort.  The pressure in the abdomen was lowered to 5mm40mand hemostasis was verified along the operative pedicles.   The 11 mm left lower quadrant port site was removed and the fascia was re-approximated using 0 vicryl using the fascial closure device.  Both remaining trocars were removed after desufflation and 5 deep breaths given by anesthesia.  The left lower quadrant port site was closed at the skin layer using 4-0 monocryl in a subcuticular fashion and the skin was re-approximated at all sites using surgical skin glue.  The patient tolerated the procedure well.  Sponge, lap, needle, and instrument counts were correct x 2.  VTE prophylaxis: The patient was given lovenox '40mg'$  Chesaning prior to surgery.  She was wearing and SCD on her right leg.  Given her bilateral axillary  lymph node removal she wore the BP cuff on her left calf during the procedure.  Antibiotic prophylaxis: None indicated. The patient tolerated the procedure well and was taken to the PACU in stable condition.   Prentice Docker, MD 05/18/2015 2:43 PM

## 2015-05-18 NOTE — Anesthesia Procedure Notes (Signed)
Procedure Name: Intubation Date/Time: 05/18/2015 1:29 PM Performed by: Delaney Meigs Pre-anesthesia Checklist: Patient identified, Emergency Drugs available, Suction available, Patient being monitored and Timeout performed Patient Re-evaluated:Patient Re-evaluated prior to inductionOxygen Delivery Method: Circle system utilized Preoxygenation: Pre-oxygenation with 100% oxygen Intubation Type: IV induction Ventilation: Mask ventilation without difficulty Laryngoscope Size: Mac and 3 Grade View: Grade II Tube type: Oral Tube size: 7.0 mm Number of attempts: 1 Airway Equipment and Method: Stylet Placement Confirmation: ETT inserted through vocal cords under direct vision,  positive ETCO2 and breath sounds checked- equal and bilateral Secured at: 21 cm Tube secured with: Tape Dental Injury: Teeth and Oropharynx as per pre-operative assessment

## 2015-05-18 NOTE — Transfer of Care (Signed)
Immediate Anesthesia Transfer of Care Note  Patient: Melinda Tanner  Procedure(s) Performed: Procedure(s): LAPAROSCOPIC BILATERAL SALPINGO OOPHORECTOMY (Bilateral)  Patient Location: PACU  Anesthesia Type:General  Level of Consciousness: awake and alert   Airway & Oxygen Therapy: Patient Spontanous Breathing and Patient connected to face mask oxygen  Post-op Assessment: Report given to RN  Post vital signs: Reviewed  Last Vitals:  Filed Vitals:   05/18/15 1306 05/18/15 1458  BP:  114/71  Pulse:  92  Temp: 37.6 C 36.1 C  Resp:  11    Complications: No apparent anesthesia complications

## 2015-05-19 ENCOUNTER — Encounter: Payer: Self-pay | Admitting: Obstetrics and Gynecology

## 2015-05-20 LAB — SURGICAL PATHOLOGY

## 2015-05-24 ENCOUNTER — Ambulatory Visit
Admission: RE | Admit: 2015-05-24 | Discharge: 2015-05-24 | Disposition: A | Discharge status: Home / Self Care | Payer: Medicare HMO | Source: Ambulatory Visit | Attending: Hematology and Oncology | Admitting: Hematology and Oncology

## 2015-05-24 DIAGNOSIS — C50919 Malignant neoplasm of unspecified site of unspecified female breast: Secondary | ICD-10-CM | POA: Diagnosis not present

## 2015-05-24 DIAGNOSIS — Z780 Asymptomatic menopausal state: Secondary | ICD-10-CM | POA: Diagnosis not present

## 2015-05-24 DIAGNOSIS — Z13820 Encounter for screening for osteoporosis: Secondary | ICD-10-CM | POA: Insufficient documentation

## 2015-05-26 DIAGNOSIS — Z09 Encounter for follow-up examination after completed treatment for conditions other than malignant neoplasm: Secondary | ICD-10-CM | POA: Diagnosis not present

## 2015-05-28 ENCOUNTER — Inpatient Hospital Stay: Payer: Medicare HMO

## 2015-05-28 ENCOUNTER — Inpatient Hospital Stay (HOSPITAL_BASED_OUTPATIENT_CLINIC_OR_DEPARTMENT_OTHER): Payer: Medicare HMO | Admitting: Hematology and Oncology

## 2015-05-28 ENCOUNTER — Other Ambulatory Visit: Payer: Self-pay | Admitting: Hematology and Oncology

## 2015-05-28 ENCOUNTER — Other Ambulatory Visit: Payer: Self-pay

## 2015-05-28 ENCOUNTER — Inpatient Hospital Stay: Payer: Medicare HMO | Attending: Hematology and Oncology

## 2015-05-28 ENCOUNTER — Telehealth: Payer: Self-pay | Admitting: Pharmacist

## 2015-05-28 VITALS — BP 121/82 | HR 90 | Temp 97.7°F | Resp 18 | Ht 63.0 in | Wt 149.3 lb

## 2015-05-28 DIAGNOSIS — Z170 Estrogen receptor positive status [ER+]: Secondary | ICD-10-CM

## 2015-05-28 DIAGNOSIS — C7951 Secondary malignant neoplasm of bone: Secondary | ICD-10-CM | POA: Insufficient documentation

## 2015-05-28 DIAGNOSIS — R599 Enlarged lymph nodes, unspecified: Secondary | ICD-10-CM | POA: Diagnosis not present

## 2015-05-28 DIAGNOSIS — C50911 Malignant neoplasm of unspecified site of right female breast: Secondary | ICD-10-CM | POA: Insufficient documentation

## 2015-05-28 DIAGNOSIS — G893 Neoplasm related pain (acute) (chronic): Secondary | ICD-10-CM

## 2015-05-28 DIAGNOSIS — Z79899 Other long term (current) drug therapy: Secondary | ICD-10-CM

## 2015-05-28 DIAGNOSIS — C50919 Malignant neoplasm of unspecified site of unspecified female breast: Secondary | ICD-10-CM

## 2015-05-28 DIAGNOSIS — Z9013 Acquired absence of bilateral breasts and nipples: Secondary | ICD-10-CM | POA: Diagnosis not present

## 2015-05-28 DIAGNOSIS — C7931 Secondary malignant neoplasm of brain: Secondary | ICD-10-CM | POA: Diagnosis not present

## 2015-05-28 DIAGNOSIS — Z452 Encounter for adjustment and management of vascular access device: Secondary | ICD-10-CM | POA: Diagnosis not present

## 2015-05-28 LAB — CBC WITH DIFFERENTIAL/PLATELET
Basophils Absolute: 0.1 10*3/uL (ref 0–0.1)
Basophils Relative: 1 %
Eosinophils Absolute: 0 10*3/uL (ref 0–0.7)
Eosinophils Relative: 1 %
HCT: 36.6 % (ref 35.0–47.0)
Hemoglobin: 12.2 g/dL (ref 12.0–16.0)
Lymphocytes Relative: 23 %
Lymphs Abs: 2.1 10*3/uL (ref 1.0–3.6)
MCH: 28.1 pg (ref 26.0–34.0)
MCHC: 33.3 g/dL (ref 32.0–36.0)
MCV: 84.3 fL (ref 80.0–100.0)
Monocytes Absolute: 0.6 10*3/uL (ref 0.2–0.9)
Monocytes Relative: 7 %
Neutro Abs: 6.2 10*3/uL (ref 1.4–6.5)
Neutrophils Relative %: 68 %
Platelets: 505 10*3/uL — ABNORMAL HIGH (ref 150–440)
RBC: 4.34 MIL/uL (ref 3.80–5.20)
RDW: 13.4 % (ref 11.5–14.5)
WBC: 9.1 10*3/uL (ref 3.6–11.0)

## 2015-05-28 LAB — COMPREHENSIVE METABOLIC PANEL
ALT: 20 U/L (ref 14–54)
AST: 20 U/L (ref 15–41)
Albumin: 4 g/dL (ref 3.5–5.0)
Alkaline Phosphatase: 123 U/L (ref 38–126)
Anion gap: 6 (ref 5–15)
BUN: 11 mg/dL (ref 6–20)
CO2: 27 mmol/L (ref 22–32)
Calcium: 9.1 mg/dL (ref 8.9–10.3)
Chloride: 101 mmol/L (ref 101–111)
Creatinine, Ser: 0.57 mg/dL (ref 0.44–1.00)
GFR calc Af Amer: >60 mL/min (ref >60)
GFR calc non Af Amer: >60 mL/min (ref >60)
Glucose, Bld: 93 mg/dL (ref 65–99)
Potassium: 3.9 mmol/L (ref 3.5–5.1)
Sodium: 134 mmol/L — ABNORMAL LOW (ref 135–145)
Total Bilirubin: 0.4 mg/dL (ref 0.3–1.2)
Total Protein: 8.6 g/dL — ABNORMAL HIGH (ref 6.5–8.1)

## 2015-05-28 MED ORDER — SODIUM CHLORIDE 0.9% FLUSH
10.0000 mL | INTRAVENOUS | Status: DC | PRN
  Administered 2015-05-28: 10 mL via INTRAVENOUS
  Filled 2015-05-28: qty 10

## 2015-05-28 MED ORDER — TAMOXIFEN CITRATE 20 MG PO TABS: 20.0000 mg | ORAL_TABLET | Freq: Every day | ORAL | Status: DC

## 2015-05-28 MED ORDER — DENOSUMAB 120 MG/1.7ML ~~LOC~~ SOLN
120.0000 mg | Freq: Once | SUBCUTANEOUS | Status: AC
  Administered 2015-05-28: 120 mg via SUBCUTANEOUS
  Filled 2015-05-28: qty 1.7

## 2015-05-28 MED ORDER — OXYCODONE HCL 5 MG PO CAPS: 5.0000 mg | ORAL_CAPSULE | ORAL | Status: DC | PRN

## 2015-05-28 MED ORDER — HEPARIN SOD (PORK) LOCK FLUSH 100 UNIT/ML IV SOLN
500.0000 [IU] | Freq: Once | INTRAVENOUS | Status: AC
  Administered 2015-05-28: 500 [IU] via INTRAVENOUS
  Filled 2015-05-28: qty 5

## 2015-05-28 NOTE — Telephone Encounter (Signed)
Patient will no longer be receiving Zoladex. Patient had hysterectomy with ovary removal.

## 2015-05-29 LAB — CANCER ANTIGEN 27.29: CA 27.29: 276.4 U/mL — ABNORMAL HIGH (ref 0.0–38.6)

## 2015-06-10 DIAGNOSIS — C50911 Malignant neoplasm of unspecified site of right female breast: Secondary | ICD-10-CM | POA: Diagnosis not present

## 2015-06-10 DIAGNOSIS — C7931 Secondary malignant neoplasm of brain: Secondary | ICD-10-CM | POA: Diagnosis not present

## 2015-06-14 ENCOUNTER — Encounter: Payer: Self-pay | Admitting: Hematology and Oncology

## 2015-06-14 NOTE — Progress Notes (Signed)
[facility] day:  05/28/2015  Chief Complaint: Melinda Tanner [name] is a 45 y.o. 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  HPI:  The patient was last seen in the medical oncology clinic on 05/14/2015.  At that time, she was seen for 2 week assessment after initiation of Zoladex and Xgeva.  She was doing well.  She tapered off her steroids.  She was taking her tamoxifen.  She noted little pain.  She was more active.  She was taking little  oxycodone for pain.  Pain was a level 6-8 out of 10 which then went down to a 1-2 out of 10.  At last visit, we discussed the plan for bilateral oophorectomy.  We discussed continuation of tamoxifen.  A baseline bone density study was scheduled.  She underwent oophorectomy on 05/18/2015.  Bone density study on 05/24/2015 revealed a T-score of -0.8 in the right femoral neck (normal).  She notes a few hot flashes.  She states that she feels colder (hands and feet).  She has been taking her tamoxifen except for 3 days after surgery.  Her appetite is slightly down.  She had some pain in her back yesterday.   Past Medical History  Diagnosis Date  . Breast cancer ([place]) 2009  . Seizures ([place])   . Brain cancer ([place]) 2012    Met. from Breast  . Complication of anesthesia     nausea, "drops in potassium and magnesium"    Past Surgical History  Procedure Laterality Date  . Mastectomy Bilateral 2010  . Brain surgery  2012, 2106  . Cesarean section    . Laparoscopic bilateral salpingo oopherectomy Bilateral 05/18/2015    Procedure: LAPAROSCOPIC BILATERAL SALPINGO OOPHORECTOMY;  Surgeon: Will Bonnet, MD;  Location: [facility] ORS;  Service: Gynecology;  Laterality: Bilateral;    Family History  Problem Relation Age of Onset  . Cancer Paternal Aunt   . Cancer Paternal Uncle   . Cancer Paternal  Grandfather   . Hypertension Brother   . Diabetes Paternal Grandmother   A paternal aunt had breast cancer age 80, a paternal uncle had prostate cancer, and a paternal grandfather had leukemia.  Social History:  reports that she has never smoked. She does not have any smokeless tobacco history on file. She reports that she does not drink alcohol or use illicit drugs.  She is from [place].  She moved to [place] in 2015.  She moved to [place] in 04/2014.  She recently moved into the [place] area.  She has 2 children (boy and girl).  Her husband is working.  The patient is accompanied by the Spanish interpreter today.  Allergies:  Allergies  Allergen Reactions  . Taxol [Paclitaxel] Anaphylaxis  . Aspirin Swelling  . Penicillins Swelling  . Zofran [Ondansetron Hcl] Nausea And Vomiting    Current Medications: Current Outpatient Prescriptions  Medication Sig Dispense Refill  . oxyCODONE (ROXICODONE) 5 MG immediate release tablet Take 1 tablet (5 mg total) by mouth every 4 (four) hours as needed for severe pain. 30 tablet 0  . promethazine (PHENERGAN) 12.5 MG tablet Take 1 tablet (12.5 mg total) by mouth every 4 (four) hours as needed for nausea or vomiting. 20 tablet 0  . oxycodone (OXY-IR) 5 MG capsule Take 1 capsule (5 mg total) by mouth every 4 (four) hours as needed. 30 capsule 0  .  tamoxifen (NOLVADEX) 20 MG tablet Take 1 tablet (20 mg total) by mouth daily. 90 tablet 1   No current facility-administered medications for this visit.    Review of Systems:  GENERAL:  Feels good.  No fevers or sweats.  Weight down 4 pounds. PERFORMANCE STATUS (ECOG):  1 HEENT:  No visual changes, runny nose, sore throat, mouth sores or tenderness. Lungs: No shortness of breath or cough.  No hemoptysis. Cardiac:  No chest pain, palpitations, orthopnea, or PND. GI:  Appetite is down.  No nausea, vomiting, diarrhea, constipation, melena or hematochezia. GU:  No urgency, frequency, dysuria, or  hematuria. Musculoskeletal:  Pain in back yesterday.  Less bone pain (hip, back, arm).  No muscle tenderness. Extremities:  No pain or swelling. Skin:  No rashes, skin changes or ulcers.  Neuro:  No headache, numbness or weakness, balance or coordination issues.  Neurosurgery ordering head MRI. Endocrine:  No diabetes, thyroid issues, hot flashes or night sweats. Psych:  No mood changes, depression or anxiety. Pain:  No focal pain. Review of systems:  All other systems reviewed and found to be negative.  Physical Exam: Blood pressure 121/82, pulse 90, temperature 97.7 F (36.5 C), temperature source Tympanic, resp. rate 18, height 5' 3"  (1.6 m), weight 149 lb 4 oz (67.7 kg), last menstrual period 03/19/2015. GENERAL:  Well developed, well nourished, sitting comfortably in the exam room in no acute distress.  MENTAL STATUS:  Alert and oriented to person, place and time. HEAD: Wearing a black and silver wrap.  Long black hair.  Normocephalic, atraumatic, face symmetric, no Cushingoid features. EYES:  Brown eyes.  Pupils equal round and reactive to light and accomodation.  No conjunctivitis or scleral icterus. ENT:  Oropharynx clear without lesion.  Tongue normal. Mucous membranes moist.  RESPIRATORY:  Clear to auscultation without rales, wheezes or rhonchi. CARDIOVASCULAR:  Regular rate and rhythm without murmur, rub or gallop. ABDOMEN:   Well healed laparoscopic incisions.  Soft, non-tender, with active bowel sounds, and no hepatosplenomegaly.  No masses. SKIN:  No rashes, ulcers or lesions. EXTREMITIES: No edema, no skin discoloration or tenderness.  No palpable cords. LYMPH NODES: No palpable cervical, supraclavicular, axillary or inguinal adenopathy  NEUROLOGICAL: Unremarkable. PSYCH:  Appropriate.   Pathology: 09/03/2007: Right breast core biopsy: infiltrating duct cell carcinoma high grade ER/PR (reportedly positive) Her2 (?) 10/02/2007: Right axillary node core biopsy: Fragment with  metastatic carcinoma 10/23/2007: Left breast core biopsy: DCIS high grade ER/PR (?) 07/13/2008: Right mastectomy: Invasive ductal carcinoma Grade III Nottingham score = tubules 3+ Nuclei 3+ mitoses 2+) 1.7 cm size. No lymph invasion. Tumor cellularity 20%. [id number]. 07/13/2008: Left mastectomy: Residual ductal carcinoma in situ, high nuclear grade, deep margin negative ypTisNO(sn)MX. 10/25/2010: Abdominal and pelvic ultrasound: questionable lesion near gallbladder fossa recommend MRI follow up. 03/02/2011: Left and right frontal excisional brain biopsy: Adenocarcinoma, metastatic, NOS: ER +, PR 5% +, Her 2 NEG. 03/04/2011: [facility] testing of IHC4 residual recurrence risk score of 114 showing an 8 year recurrence reate of 57%. ER POSTIVE, PR POSITIVE, HER 2 POSTIVE (previously documented negative in 2009) cutoff of 361 patient scores 426. ki67 71%.  08/05/2013: Right breast FNA supraclavicular region: positive for metastatic carcinoma. ER/PR/HER2 not reported. 08/19/2014: Right cervical lymph node. Adenocarcinoma with features consistent with metastatic breast carcinoma. ER/PR/HER2 insufficient tissue. 09/04/2014: Repeat craniotomy, resection of right frontal tumor (metastatic breast cancer). ER 90-100% PR 51-60% HER2 - 09/29/2014: Right cervical lymph node, metastatic carcinoma c/w breast primary, ER 100% PR 20% HER2-  Imaging: 08/15/2007: Bilateral Mammogram: Dense breasts with solid lesion at 6:00, 7:00, and 9:00 of right breast, solid lesion at 2:00 position left breast BIRADS 4B. 09/10/2007: Breast MRI: Three solid irregular enhancing lesions of the right breast 2.1 x 3.2, 2.8 x 2.7, and 1.8 x 1.6 cm. Mildly enlarged enhancing nodule right axilla. Left breast clumped enhancement midportion suspicious for malignancy. 03/01/2011: Brain MRI: At least two juxtacortical, intra-axial masses with extensive surrounding vasogenic edema most consistent with intracranial metastasis. 07/22/2013: Brain MRI  +/- contrast: interval increase in enhancing component on T2 FLAIR now measuring 1.0cm x 1.1 cm worrisome for progression of disease. 01/21/2014:  PET scan: Hypermetabolic small right supraclavicular and right upper paratracheal lymph nodes suspicious for mets. Hypermetabolic lymph node in the right paratracheal upper mediastinum that measure approximately 0.9 x 0.5cm. (of note no impressions made of areas noted on brain MRI 07/22/13).  01/21/2014: MRI Brain: Right frontal enhancing lesion has shown interval increase in size with surrounding edema and local mass effect (measured 1.8x2.1x1.6, previously 1.2x1.1x1.0) 07/16/2014: Brain MRI:  Right anterior frontal enhancing mass 2.5 x 2.0 cm compatible with a metastatic lesion has increased and changed in configuration from previous 2.0 x 1.8 cm, formerly multilobular. 08/06/2014: PET scan:  Multiple bilateral FDG avid low cervical, supraclavicular, upper mediastinal, and right internal mammary lymphadenopathy, likely representing metastatic disease. A cervical node in the right lower neck should be amenable to percutaneous sampling.  12/2014: Head MRI:  Evolution of right frontal postoperative changes status post tumor resection at the vertex. Expected postoperative changes. Minimal marginal enhancement resection bed with some adjacent posterior intrinsic T1 signal again seen. Decreasing T2/FLAIR adjacent parenchymal changes.  Stable resection cavity is left posterior frontal and right parieto-occipital regions.  Labs:  Appointment on 05/28/2015  Component Date Value Ref Range Status  . WBC 05/28/2015 9.1  3.6 - 11.0 K/uL Final  . RBC 05/28/2015 4.34  3.80 - 5.20 MIL/uL Final  . Hemoglobin 05/28/2015 12.2  12.0 - 16.0 g/dL Final  . HCT 05/28/2015 36.6  35.0 - 47.0 % Final  . MCV 05/28/2015 84.3  80.0 - 100.0 fL Final  . MCH 05/28/2015 28.1  26.0 - 34.0 pg Final  . MCHC 05/28/2015 33.3  32.0 - 36.0 g/dL Final  . RDW 05/28/2015 13.4  11.5 - 14.5 % Final   . Platelets 05/28/2015 505* 150 - 440 K/uL Final  . Neutrophils Relative % 05/28/2015 68   Final  . Neutro Abs 05/28/2015 6.2  1.4 - 6.5 K/uL Final  . Lymphocytes Relative 05/28/2015 23   Final  . Lymphs Abs 05/28/2015 2.1  1.0 - 3.6 K/uL Final  . Monocytes Relative 05/28/2015 7   Final  . Monocytes Absolute 05/28/2015 0.6  0.2 - 0.9 K/uL Final  . Eosinophils Relative 05/28/2015 1   Final  . Eosinophils Absolute 05/28/2015 0.0  0 - 0.7 K/uL Final  . Basophils Relative 05/28/2015 1   Final  . Basophils Absolute 05/28/2015 0.1  0 - 0.1 K/uL Final  . Sodium 05/28/2015 134* 135 - 145 mmol/L Final  . Potassium 05/28/2015 3.9  3.5 - 5.1 mmol/L Final  . Chloride 05/28/2015 101  101 - 111 mmol/L Final  . CO2 05/28/2015 27  22 - 32 mmol/L Final  . Glucose, Bld 05/28/2015 93  65 - 99 mg/dL Final  . BUN 05/28/2015 11  6 - 20 mg/dL Final  . Creatinine, Ser 05/28/2015 0.57  0.44 - 1.00 mg/dL Final  . Calcium 05/28/2015 9.1  8.9 -  10.3 mg/dL Final  . Total Protein 05/28/2015 8.6* 6.5 - 8.1 g/dL Final  . Albumin 05/28/2015 4.0  3.5 - 5.0 g/dL Final  . AST 05/28/2015 20  15 - 41 U/L Final  . ALT 05/28/2015 20  14 - 54 U/L Final  . Alkaline Phosphatase 05/28/2015 123  38 - 126 U/L Final  . Total Bilirubin 05/28/2015 0.4  0.3 - 1.2 mg/dL Final  . GFR calc non Af Amer 05/28/2015 >60  >60 mL/min Final  . GFR calc Af Amer 05/28/2015 >60  >60 mL/min Final   Comment: (NOTE) The eGFR has been calculated using the CKD EPI equation. This calculation has not been validated in all clinical situations. eGFR's persistently <60 mL/min signify possible Chronic Kidney Disease.   . Anion gap 05/28/2015 6  5 - 15 Final  . CA 27.29 05/28/2015 276.4* 0.0 - 38.6 U/mL Final   Comment: (NOTE) [facility]/ACS methodology Performed At: [facility] [address] [place], [place] [id number] Lindon Romp MD JA:[id number]     Assessment:  Melinda Tanner is a 45 y.o. female with metastatic  breast cancer to brain and lymph nodes.  She initially presented in 2009 while living in [place] with multi-focal right breast cancer with positive lymph node(s) which was ER+, PR+(low), and HER2/neu+. She received neoadjuvant chemotherapy (AC x 4 every 3 weeks followed by Taxol/Abraxane + carboplatin weekly x 12) without anti-HER2 treatment.    On 07/14/2008, she underwent bilateral mastectomies followed by reconstruction.  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   Zero of 11 lymph nodes on the right were positive for malignancy. 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 Deep margin was negative. Zero of 2 lymph nodes were positive for metastatic disease.  She received adjuvant radiation to the right breast.  She received adjuvant tamoxifen and Zoladex.  She could not tolerate symptoms of joint pain and tamoxifen was discontinued after 1-2 months.  Zoladex was continued for approximately 1.5 years.  She was diagnosed with brain metastases in 02/2011.  Pathology revealed ER+,PR+(low), and HER2/neu-.  She underwent resection followed by [facility].  On 03/09/2011, original breast tissue (09/03/2007) sent to [facility] testing.  Testing revealed ER+, PR+(borderline), and HER2/neu positive (different than initial testing).  She received Herceptin for 1 year beginning in 2013.  She then began Tykerb and Xeloda after completion of Herceptin (2014).  She was on extremely low, and probably sub-therapeutic, dosing of lapatinib + capecitabine.   She developed right supraclavicular adenopathy in 2015. She was noted to have slow disease progression in the right frontal/parietal lesion with minimal presence of systemic disease on PET scans. Capecitabine + lapatinib were discontinued in 04/2014.   She underwent repeat craniotomy with resection of brain metastasis on 09/04/2014.  Biopsy of the right supraclavicular lymph node  was ER+,PR+,HER2/neu-.  She  started tamoxifen in 09/2014, but discontinued it secondary to pain in hip, back, and shoulder.  PET scan on 04/20/2015 revealed cervical and thoracic nodal metastasis.  There was multifocal osseous metastasis (left transvers T4 process, left humeral head, right iliac wing, and left side of the sacrum).  Incidental findings included right nephrolithiasis.  CA27.29 was 177.8 on 04/16/2015.  Bone scan on 04/22/2015 corresponded with PET-CT with metastatic lesions evident in the left humeral head, left posterior aspect of T4, left aspect of S1, and the right iliac crest.  There was no other definite foci of metastatic disease to bone.  Uptake in the skull was most suggestive of the previous right frontal craniotomy.  Bone density study on 05/24/2015 revealed a T-score of -0.8 in the right femoral neck (normal).  She restarted tamoxifen on 04/23/2015.  She received Zoladex and Xgeva on 04/30/2015.  She underwent laparoscopic bilateral oophorectomy on 05/18/2015.  Symptomatically, she is doing well.  She has minimal pain.  Plan: 1.  Discuss interval surgery and bone density study.  Discuss no further Zoladex s/p oophorectomy. 2.  Labs today:  CBC with diff, CMP, CA27.29. 3.  Discuss calcium and vitamin D. 4.  Continue tamoxifen.  Refill. 5.  Xgeva today. 6.  Port flush today and every 6-8 weeks. 7.  Rx:  oxycodone 5 mg po q 4 hours prn pain; dis:#30. 8.  RTC in 1 month for MD assessment, labs (CBC with diff, CMP, CA27.29), and Xgeva.   Lequita Asal, MD  05/28/2015

## 2015-06-28 ENCOUNTER — Inpatient Hospital Stay (HOSPITAL_BASED_OUTPATIENT_CLINIC_OR_DEPARTMENT_OTHER): Payer: Medicare HMO | Admitting: Hematology and Oncology

## 2015-06-28 ENCOUNTER — Other Ambulatory Visit: Payer: Self-pay

## 2015-06-28 ENCOUNTER — Inpatient Hospital Stay: Payer: Medicare HMO | Attending: Hematology and Oncology

## 2015-06-28 ENCOUNTER — Ambulatory Visit
Admission: RE | Admit: 2015-06-28 | Discharge: 2015-06-28 | Disposition: A | Discharge status: Home / Self Care | Payer: Medicare HMO | Source: Ambulatory Visit | Attending: Hematology and Oncology | Admitting: Hematology and Oncology

## 2015-06-28 ENCOUNTER — Inpatient Hospital Stay: Payer: Medicare HMO

## 2015-06-28 VITALS — BP 123/86 | HR 102 | Temp 97.8°F | Resp 18 | Wt 148.8 lb

## 2015-06-28 DIAGNOSIS — C7951 Secondary malignant neoplasm of bone: Secondary | ICD-10-CM

## 2015-06-28 DIAGNOSIS — Z9221 Personal history of antineoplastic chemotherapy: Secondary | ICD-10-CM

## 2015-06-28 DIAGNOSIS — C419 Malignant neoplasm of bone and articular cartilage, unspecified: Secondary | ICD-10-CM | POA: Insufficient documentation

## 2015-06-28 DIAGNOSIS — K219 Gastro-esophageal reflux disease without esophagitis: Secondary | ICD-10-CM | POA: Insufficient documentation

## 2015-06-28 DIAGNOSIS — Z79810 Long term (current) use of selective estrogen receptor modulators (SERMs): Secondary | ICD-10-CM | POA: Insufficient documentation

## 2015-06-28 DIAGNOSIS — C50811 Malignant neoplasm of overlapping sites of right female breast: Secondary | ICD-10-CM | POA: Insufficient documentation

## 2015-06-28 DIAGNOSIS — IMO0001 Reserved for inherently not codable concepts without codable children: Secondary | ICD-10-CM

## 2015-06-28 DIAGNOSIS — R569 Unspecified convulsions: Secondary | ICD-10-CM

## 2015-06-28 DIAGNOSIS — Z79899 Other long term (current) drug therapy: Secondary | ICD-10-CM

## 2015-06-28 DIAGNOSIS — G893 Neoplasm related pain (acute) (chronic): Secondary | ICD-10-CM

## 2015-06-28 DIAGNOSIS — N200 Calculus of kidney: Secondary | ICD-10-CM | POA: Insufficient documentation

## 2015-06-28 DIAGNOSIS — Z170 Estrogen receptor positive status [ER+]: Secondary | ICD-10-CM

## 2015-06-28 DIAGNOSIS — C7931 Secondary malignant neoplasm of brain: Secondary | ICD-10-CM | POA: Diagnosis not present

## 2015-06-28 DIAGNOSIS — C779 Secondary and unspecified malignant neoplasm of lymph node, unspecified: Secondary | ICD-10-CM | POA: Insufficient documentation

## 2015-06-28 DIAGNOSIS — M84822 Other disorders of continuity of bone, left humerus: Secondary | ICD-10-CM | POA: Diagnosis not present

## 2015-06-28 DIAGNOSIS — M79622 Pain in left upper arm: Secondary | ICD-10-CM | POA: Insufficient documentation

## 2015-06-28 DIAGNOSIS — Z9013 Acquired absence of bilateral breasts and nipples: Secondary | ICD-10-CM | POA: Diagnosis not present

## 2015-06-28 DIAGNOSIS — C50911 Malignant neoplasm of unspecified site of right female breast: Secondary | ICD-10-CM

## 2015-06-28 DIAGNOSIS — Z808 Family history of malignant neoplasm of other organs or systems: Secondary | ICD-10-CM

## 2015-06-28 DIAGNOSIS — M25512 Pain in left shoulder: Secondary | ICD-10-CM | POA: Diagnosis not present

## 2015-06-28 LAB — CBC WITH DIFFERENTIAL/PLATELET
Basophils Absolute: 0.1 10*3/uL (ref 0–0.1)
Basophils Relative: 1 %
Eosinophils Absolute: 0.1 10*3/uL (ref 0–0.7)
Eosinophils Relative: 1 %
HCT: 37.4 % (ref 35.0–47.0)
Hemoglobin: 12.3 g/dL (ref 12.0–16.0)
Lymphocytes Relative: 23 %
Lymphs Abs: 2.2 10*3/uL (ref 1.0–3.6)
MCH: 27.6 pg (ref 26.0–34.0)
MCHC: 32.9 g/dL (ref 32.0–36.0)
MCV: 83.7 fL (ref 80.0–100.0)
Monocytes Absolute: 0.9 10*3/uL (ref 0.2–0.9)
Monocytes Relative: 9 %
Neutro Abs: 6.3 10*3/uL (ref 1.4–6.5)
Neutrophils Relative %: 66 %
Platelets: 356 10*3/uL (ref 150–440)
RBC: 4.47 MIL/uL (ref 3.80–5.20)
RDW: 14.8 % — ABNORMAL HIGH (ref 11.5–14.5)
WBC: 9.6 10*3/uL (ref 3.6–11.0)

## 2015-06-28 LAB — COMPREHENSIVE METABOLIC PANEL
ALT: 45 U/L (ref 14–54)
AST: 51 U/L — ABNORMAL HIGH (ref 15–41)
Albumin: 4 g/dL (ref 3.5–5.0)
Alkaline Phosphatase: 123 U/L (ref 38–126)
Anion gap: 5 (ref 5–15)
BUN: 11 mg/dL (ref 6–20)
CO2: 27 mmol/L (ref 22–32)
Calcium: 9.1 mg/dL (ref 8.9–10.3)
Chloride: 102 mmol/L (ref 101–111)
Creatinine, Ser: 0.56 mg/dL (ref 0.44–1.00)
GFR calc Af Amer: >60 mL/min (ref >60)
GFR calc non Af Amer: >60 mL/min (ref >60)
Glucose, Bld: 102 mg/dL — ABNORMAL HIGH (ref 65–99)
Potassium: 3.6 mmol/L (ref 3.5–5.1)
Sodium: 134 mmol/L — ABNORMAL LOW (ref 135–145)
Total Bilirubin: 0.4 mg/dL (ref 0.3–1.2)
Total Protein: 8.3 g/dL — ABNORMAL HIGH (ref 6.5–8.1)

## 2015-06-28 MED ORDER — PANTOPRAZOLE SODIUM 20 MG PO TBEC: 20.0000 mg | DELAYED_RELEASE_TABLET | Freq: Every day | ORAL | Status: DC

## 2015-06-28 MED ORDER — DENOSUMAB 120 MG/1.7ML ~~LOC~~ SOLN
120.0000 mg | Freq: Once | SUBCUTANEOUS | Status: AC
  Administered 2015-06-28: 120 mg via SUBCUTANEOUS
  Filled 2015-06-28: qty 1.7

## 2015-06-28 NOTE — Progress Notes (Signed)
[facility] day:  06/28/2015   Chief Complaint: Melinda Tanner Melinda Tanner is a 45 y.o. 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  HPI:  The patient was last seen in the medical oncology clinic on 05/28/2015.  At that time, she was seen for assessment after interval oophorectomy and prior to monthly Xgeva.  CA27.29 was 276.4  During the interim, she has done well (a month without pain) until 06/22/2015.  She describes pain in her upper left arm pulling open umbrella.  She has not sought medical attention. She has put her arm in a sling.  She states that her hip pain is about the same.  She has not taken any pain medications until recently. She has been trying some Ginger and tumeric tea.  She is having some issues with reflux and would like to try Protonix.   Past Medical History  Diagnosis Date  . Breast cancer ([place]) 2009  . Seizures ([place])   . Brain cancer ([place]) 2012    Met. from Breast  . Complication of anesthesia     nausea, "drops in potassium and magnesium"    Past Surgical History  Procedure Laterality Date  . Mastectomy Bilateral 2010  . Brain surgery  2012, 2106  . Cesarean section    . Laparoscopic bilateral salpingo oopherectomy Bilateral 05/18/2015    Procedure: LAPAROSCOPIC BILATERAL SALPINGO OOPHORECTOMY;  Surgeon: Will Bonnet, MD;  Location: [facility] ORS;  Service: Gynecology;  Laterality: Bilateral;    Family History  Problem Relation Age of Onset  . Cancer Paternal Aunt   . Cancer Paternal Uncle   . Cancer Paternal Grandfather   . Hypertension Brother   . Diabetes Paternal Grandmother   A paternal aunt had breast cancer age 1, a paternal uncle had prostate cancer, and a paternal grandfather had leukemia.  Social History:  reports that she has never smoked. She does not have any smokeless tobacco history on file. She reports that  she does not drink alcohol or use illicit drugs.  She is from [place].  She moved to [place] in 2015.  She moved to [place] in 04/2014.  She recently moved into the [place] area.  She has 2 children (boy and girl). The patient is accompanied by her husband and the Spanish interpreter today.  Allergies:  Allergies  Allergen Reactions  . Taxol [Paclitaxel] Anaphylaxis  . Aspirin Swelling  . Penicillins Swelling  . Zofran [Ondansetron Hcl] Nausea And Vomiting    Current Medications: Current Outpatient Prescriptions  Medication Sig Dispense Refill  . omeprazole (PRILOSEC) 40 MG capsule Take 1 capsule by mouth as needed.    Marland Kitchen oxycodone (OXY-IR) 5 MG capsule Take 1 capsule (5 mg total) by mouth every 4 (four) hours as needed. 30 capsule 0  . tamoxifen (NOLVADEX) 20 MG tablet Take 1 tablet (20 mg total) by mouth daily. 90 tablet 1   No current facility-administered medications for this visit.    Review of Systems:  GENERAL:  Feels "ok" except for arm.  No fevers or sweats.  Weight stable. PERFORMANCE STATUS (ECOG):  1 HEENT:  No visual changes, runny nose, sore throat, mouth sores or tenderness. Lungs: No shortness of breath or cough.  No hemoptysis. Cardiac:  No chest pain, palpitations, orthopnea, or PND. GI:  Appetite 75%.  Reflux.  No nausea, vomiting, diarrhea, constipation, melena or hematochezia. GU:  No urgency, frequency, dysuria, or hematuria. Musculoskeletal:  Pain in left upper arm since jarring injury (see HPI).  Less bone pain (hip, back, arm).  No muscle tenderness. Extremities:  No pain or swelling. Skin:  No rashes, skin changes or ulcers.  Neuro:  No headache, numbness or weakness, balance or coordination issues.  Neurosurgery ordering head MRI. Endocrine:  No diabetes, thyroid issues, hot flashes or night sweats. Psych:  No mood changes, depression or anxiety. Pain:  No focal pain. Review of systems:  All other systems reviewed and found to be  negative.  Physical Exam: Blood pressure 123/86, pulse 102, temperature 97.8 F (36.6 C), temperature source Tympanic, resp. rate 18, weight 148 lb 13 oz (67.5 kg). GENERAL:  Well developed, well nourished, sitting comfortably in the exam room in no acute distress.  She is tearful at times. MENTAL STATUS:  Alert and oriented to person, place and time. HEAD: Long black hair.  Normocephalic, atraumatic, face symmetric, no Cushingoid features. EYES:  Brown eyes.  Pupils equal round and reactive to light and accomodation.  No conjunctivitis or scleral icterus. ENT:  Oropharynx clear without lesion.  Tongue normal. Mucous membranes moist.  RESPIRATORY:  Clear to auscultation without rales, wheezes or rhonchi. CARDIOVASCULAR:  Regular rate and rhythm without murmur, rub or gallop. ABDOMEN:   Well healed laparoscopic incisions.  Soft, non-tender, with active bowel sounds, and no hepatosplenomegaly.  No masses. SKIN:  No rashes, ulcers or lesions. EXTREMITIES: Left arm in a sling.  Pain on palpation left upper humerus.  No edema, no skin discoloration or tenderness.  No palpable cords. LYMPH NODES: No palpable cervical, supraclavicular, axillary or inguinal adenopathy  NEUROLOGICAL: Unremarkable. PSYCH:  Appropriate.   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 No lymph invasion. Tumor cellularity 20%. [id number]. 07/13/2008: Left mastectomy: Residual ductal carcinoma in situ, high nuclear grade, deep margin negative ypTisNO(sn)MX. 10/25/2010: Abdominal and pelvic ultrasound: questionable lesion near gallbladder fossa recommend MRI follow  up. 03/02/2011: Left and right frontal excisional brain biopsy: Adenocarcinoma, metastatic, NOS: ER +, PR 5% +, Her 2 NEG. 03/04/2011: [facility] testing of IHC4 residual recurrence risk score of 114 showing an 8 year recurrence reate of 57%. ER POSTIVE, PR POSITIVE, HER 2 POSTIVE (previously documented negative in 2009) cutoff of 361 patient scores 426. ki67 71%.  08/05/2013: Right breast FNA supraclavicular region: positive for metastatic carcinoma. ER/PR/HER2 not reported. 08/19/2014: Right cervical lymph node. Adenocarcinoma with features consistent with metastatic breast carcinoma. ER/PR/HER2 insufficient tissue. 09/04/2014: Repeat craniotomy, resection of right frontal tumor (metastatic breast cancer). ER 90-100% PR 51-60% HER2 - 09/29/2014: Right cervical lymph node, metastatic carcinoma c/w breast primary, ER 100% PR 20% HER2-  Imaging: 08/15/2007: Bilateral Mammogram: Dense breasts with solid lesion at 6:00, 7:00, and 9:00 of right breast, solid lesion at 2:00 position left breast BIRADS 4B. 09/10/2007: Breast MRI: Three solid irregular enhancing lesions of the right breast 2.1 x 3.2, 2.8 x 2.7, and 1.8 x 1.6 cm. Mildly enlarged enhancing nodule right axilla. Left breast clumped enhancement midportion suspicious for malignancy. 03/01/2011: Brain MRI: At least two juxtacortical, intra-axial masses with extensive surrounding vasogenic edema most consistent with intracranial metastasis. 07/22/2013: Brain MRI +/- contrast: interval increase in enhancing component  on T2 FLAIR now measuring 1.0cm x 1.1 cm worrisome for progression of disease. 01/21/2014:  PET scan: Hypermetabolic small right supraclavicular and right upper paratracheal lymph nodes suspicious for mets. Hypermetabolic lymph node in the right paratracheal upper mediastinum that measure approximately 0.9 x 0.5cm. (of note no impressions made of areas noted on brain MRI 07/22/13).  01/21/2014: MRI Brain: Right frontal enhancing lesion has  shown interval increase in size with surrounding edema and local mass effect (measured 1.8x2.1x1.6, previously 1.2x1.1x1.0) 07/16/2014: Brain MRI:  Right anterior frontal enhancing mass 2.5 x 2.0 cm compatible with a metastatic lesion has increased and changed in configuration from previous 2.0 x 1.8 cm, formerly multilobular. 08/06/2014: PET scan:  Multiple bilateral FDG avid low cervical, supraclavicular, upper mediastinal, and right internal mammary lymphadenopathy, likely representing metastatic disease. A cervical node in the right lower neck should be amenable to percutaneous sampling.  12/2014: Head MRI:  Evolution of right frontal postoperative changes status post tumor resection at the vertex. Expected postoperative changes. Minimal marginal enhancement resection bed with some adjacent posterior intrinsic T1 signal again seen. Decreasing T2/FLAIR adjacent parenchymal changes.  Stable resection cavity is left posterior frontal and right parieto-occipital regions.  Labs:  Appointment on 06/28/2015  Component Date Value Ref Range Status  . WBC 06/28/2015 9.6  3.6 - 11.0 K/uL Final  . RBC 06/28/2015 4.47  3.80 - 5.20 MIL/uL Final  . Hemoglobin 06/28/2015 12.3  12.0 - 16.0 g/dL Final  . HCT 06/28/2015 37.4  35.0 - 47.0 % Final  . MCV 06/28/2015 83.7  80.0 - 100.0 fL Final  . MCH 06/28/2015 27.6  26.0 - 34.0 pg Final  . MCHC 06/28/2015 32.9  32.0 - 36.0 g/dL Final  . RDW 06/28/2015 14.8* 11.5 - 14.5 % Final  . Platelets 06/28/2015 356  150 - 440 K/uL Final  . Neutrophils Relative % 06/28/2015 66   Final  . Neutro Abs 06/28/2015 6.3  1.4 - 6.5 K/uL Final  . Lymphocytes Relative 06/28/2015 23   Final  . Lymphs Abs 06/28/2015 2.2  1.0 - 3.6 K/uL Final  . Monocytes Relative 06/28/2015 9   Final  . Monocytes Absolute 06/28/2015 0.9  0.2 - 0.9 K/uL Final  . Eosinophils Relative 06/28/2015 1   Final  . Eosinophils Absolute 06/28/2015 0.1  0 - 0.7 K/uL Final  . Basophils Relative 06/28/2015 1    Final  . Basophils Absolute 06/28/2015 0.1  0 - 0.1 K/uL Final  . Sodium 06/28/2015 134* 135 - 145 mmol/L Final  . Potassium 06/28/2015 3.6  3.5 - 5.1 mmol/L Final  . Chloride 06/28/2015 102  101 - 111 mmol/L Final  . CO2 06/28/2015 27  22 - 32 mmol/L Final  . Glucose, Bld 06/28/2015 102* 65 - 99 mg/dL Final  . BUN 06/28/2015 11  6 - 20 mg/dL Final  . Creatinine, Ser 06/28/2015 0.56  0.44 - 1.00 mg/dL Final  . Calcium 06/28/2015 9.1  8.9 - 10.3 mg/dL Final  . Total Protein 06/28/2015 8.3* 6.5 - 8.1 g/dL Final  . Albumin 06/28/2015 4.0  3.5 - 5.0 g/dL Final  . AST 06/28/2015 51* 15 - 41 U/L Final  . ALT 06/28/2015 45  14 - 54 U/L Final  . Alkaline Phosphatase 06/28/2015 123  38 - 126 U/L Final  . Total Bilirubin 06/28/2015 0.4  0.3 - 1.2 mg/dL Final  . GFR calc non Af Amer 06/28/2015 >60  >60 mL/min Final  . GFR calc Af Amer 06/28/2015 >60  >60  mL/min Final   Comment: (NOTE) The eGFR has been calculated using the CKD EPI equation. This calculation has not been validated in all clinical situations. eGFR's persistently <60 mL/min signify possible Chronic Kidney Disease.   . Anion gap 06/28/2015 5  5 - 15 Final    Assessment:  Melinda Tanner is a 45 y.o. 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  She initially presented in 2009 while living in [place] with multi-focal right breast cancer with positive lymph node(s) which was ER+, PR+(low), and HER2/neu+. She received neoadjuvant chemotherapy (AC x 4 every 3 weeks followed by Taxol/Abraxane + carboplatin weekly x 12) without anti-HER2 treatment.    On 07/14/2008, she underwent bilateral mastectomies followed by reconstruction.  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   Zero of 11 lymph nodes on the right were positive for malignancy. 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 Deep margin was negative. Zero of 2 lymph nodes were positive for metastatic  disease.  She received adjuvant radiation to the right breast.  She received adjuvant tamoxifen and Zoladex.  She could not tolerate symptoms of joint pain and tamoxifen was discontinued after 1-2 months.  Zoladex was continued for approximately 1.5 years.  She was diagnosed with brain metastases in 02/2011.  Pathology revealed ER+,PR+(low), and HER2/neu-.  She underwent resection followed by [facility].  On 03/09/2011, original breast tissue (09/03/2007) sent to [facility] testing.  Testing revealed ER+, PR+(borderline), and HER2/neu positive (different than initial testing).  She received Herceptin for 1 year beginning in 2013.  She then began Tykerb and Xeloda after completion of Herceptin (2014).  She was on extremely low, and probably sub-therapeutic, dosing of lapatinib + capecitabine.   She developed right supraclavicular adenopathy in 2015. She was noted to have slow disease progression in the right frontal/parietal lesion with minimal presence of systemic disease on PET scans. Capecitabine + lapatinib were discontinued in 04/2014.   She underwent repeat craniotomy with resection of brain metastasis on 09/04/2014.  Biopsy of the right supraclavicular lymph node  was ER+,PR+,HER2/neu-.  She started tamoxifen in 09/2014, but discontinued it secondary to pain in hip, back, and shoulder.  PET scan on 04/20/2015 revealed cervical and thoracic nodal metastasis.  There was multifocal osseous metastasis (left transvers T4 process, left humeral head, right iliac wing, and left side of the sacrum).  Incidental findings included right nephrolithiasis.    CA27.29 was 177.8 on 04/16/2015, 276.4 on 05/28/2015, and 287.5 today.  Bone scan on 04/22/2015 corresponded with PET-CT with metastatic lesions evident in the left humeral head, left posterior aspect of T4, left aspect of S1, and the right iliac crest.  There was no other definite foci of metastatic disease to bone.  Uptake in the skull was  most suggestive of the previous right frontal craniotomy.  Bone density study on 05/24/2015 revealed a T-score of -0.8 in the right femoral neck (normal).  She restarted tamoxifen on 04/23/2015.  She received Zoladex on 04/30/2015.  She underwent laparoscopic bilateral oophorectomy on 05/18/2015.  She receives monthly Xgeva (began 04/30/2015; last 05/28/2015).  Symptomatically, she has had a month without pain.  She notes left upper arm pain after opening up an umbrella.  She has reflux.  Plan: 1.  Labs today:  CBC with diff, CMP, CA27.29. 2.  Plain films of left shoulder and humerus. 3.  Discuss consideration of XRT to left upper arm to known lytic lesion if  pain persists. 4.  Continue tamoxifen.  5.  Xgeva today. 6.  Head MRI:  history of brain metastasis s/p resection; compare to 12/2014. 7.  Port flush every 6-8 weeks. 8.  Rx:  Protonix. 9.  RTC in 1 month for MD assessment, labs (CBC with diff, CMP, CA27.29), and Xgeva.   Lequita Asal, MD  06/28/2015, 3:13 PM

## 2015-06-28 NOTE — Progress Notes (Signed)
Pt had recent injury to left shoulder and has occasional pain due to injury to that shoulder.

## 2015-06-29 ENCOUNTER — Other Ambulatory Visit: Payer: Self-pay

## 2015-06-29 DIAGNOSIS — C50911 Malignant neoplasm of unspecified site of right female breast: Secondary | ICD-10-CM

## 2015-06-29 LAB — CANCER ANTIGEN 27.29: CA 27.29: 287.5 U/mL — ABNORMAL HIGH (ref 0.0–38.6)

## 2015-07-01 ENCOUNTER — Encounter: Payer: Self-pay | Admitting: Hematology and Oncology

## 2015-07-01 DIAGNOSIS — IMO0001 Reserved for inherently not codable concepts without codable children: Secondary | ICD-10-CM | POA: Insufficient documentation

## 2015-07-01 DIAGNOSIS — K219 Gastro-esophageal reflux disease without esophagitis: Secondary | ICD-10-CM

## 2015-07-07 ENCOUNTER — Ambulatory Visit
Admission: RE | Admit: 2015-07-07 | Discharge: 2015-07-07 | Disposition: A | Discharge status: Home / Self Care | Payer: Medicare HMO | Source: Ambulatory Visit | Attending: Hematology and Oncology | Admitting: Hematology and Oncology

## 2015-07-07 DIAGNOSIS — C50919 Malignant neoplasm of unspecified site of unspecified female breast: Secondary | ICD-10-CM | POA: Diagnosis not present

## 2015-07-07 DIAGNOSIS — Z9889 Other specified postprocedural states: Secondary | ICD-10-CM | POA: Insufficient documentation

## 2015-07-07 DIAGNOSIS — C7931 Secondary malignant neoplasm of brain: Secondary | ICD-10-CM | POA: Insufficient documentation

## 2015-07-07 MED ORDER — GADOBENATE DIMEGLUMINE 529 MG/ML IV SOLN
15.0000 mL | Freq: Once | INTRAVENOUS | Status: AC | PRN
  Administered 2015-07-07: 13 mL via INTRAVENOUS

## 2015-07-09 ENCOUNTER — Inpatient Hospital Stay: Payer: Medicare HMO

## 2015-07-13 ENCOUNTER — Inpatient Hospital Stay: Payer: Medicare HMO

## 2015-07-13 ENCOUNTER — Inpatient Hospital Stay (HOSPITAL_BASED_OUTPATIENT_CLINIC_OR_DEPARTMENT_OTHER): Payer: Medicare HMO | Admitting: Hematology and Oncology

## 2015-07-13 ENCOUNTER — Encounter: Payer: Self-pay | Admitting: Hematology and Oncology

## 2015-07-13 VITALS — BP 123/82 | HR 100 | Temp 97.5°F | Resp 118 | Ht 63.0 in | Wt 153.7 lb

## 2015-07-13 DIAGNOSIS — R300 Dysuria: Secondary | ICD-10-CM

## 2015-07-13 DIAGNOSIS — C50811 Malignant neoplasm of overlapping sites of right female breast: Secondary | ICD-10-CM

## 2015-07-13 DIAGNOSIS — C50919 Malignant neoplasm of unspecified site of unspecified female breast: Secondary | ICD-10-CM

## 2015-07-13 DIAGNOSIS — C779 Secondary and unspecified malignant neoplasm of lymph node, unspecified: Secondary | ICD-10-CM | POA: Diagnosis not present

## 2015-07-13 DIAGNOSIS — Z9221 Personal history of antineoplastic chemotherapy: Secondary | ICD-10-CM

## 2015-07-13 DIAGNOSIS — C7951 Secondary malignant neoplasm of bone: Secondary | ICD-10-CM | POA: Diagnosis not present

## 2015-07-13 DIAGNOSIS — Z170 Estrogen receptor positive status [ER+]: Secondary | ICD-10-CM

## 2015-07-13 DIAGNOSIS — C7931 Secondary malignant neoplasm of brain: Secondary | ICD-10-CM

## 2015-07-13 DIAGNOSIS — Z79810 Long term (current) use of selective estrogen receptor modulators (SERMs): Secondary | ICD-10-CM

## 2015-07-13 DIAGNOSIS — Z9013 Acquired absence of bilateral breasts and nipples: Secondary | ICD-10-CM

## 2015-07-13 DIAGNOSIS — Z79899 Other long term (current) drug therapy: Secondary | ICD-10-CM

## 2015-07-13 LAB — URINALYSIS COMPLETE WITH MICROSCOPIC (ARMC ONLY)
Bilirubin Urine: NEGATIVE
Glucose, UA: NEGATIVE mg/dL
Hgb urine dipstick: NEGATIVE
Ketones, ur: NEGATIVE mg/dL
Leukocytes, UA: NEGATIVE
Nitrite: NEGATIVE
Protein, ur: 30 mg/dL — AB
Specific Gravity, Urine: 1.014 (ref 1.005–1.030)
pH: 5 (ref 5.0–8.0)

## 2015-07-13 NOTE — Progress Notes (Addendum)
[facility] day:  07/13/2015   Chief Complaint: Jailynn Lavalais Wynema Birch is a 45 y.o. 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  HPI:  The patient was last seen in the medical oncology clinic on 06/28/2015.  At that Tanner, Melinda Tanner.  Melinda desribed a month without pain.  Melinda noted recent left upper arm pain after opening up an umbrella.  Melinda had reflux for which Protonix was prescribed.  CA27.29 was 287.7 (prior value 276.4).  Plain films of the left shoulder and humerus were obtained on 06/28/2015.  There was no fracture of malalignment in the left shoulder.  There was re-demonstration of the known predominantly lytic bone metastasis in the posterior left humeral head.  Head MRI on 07/07/2015 revealed metastatic breast cancer with 3 resection cavities. The anterior right frontal cavity was positive for nodular marginal enhancement, new from brain MRI report 01/07/2015, and consistent with recurrent disease.   There was no associated mass effect.  The left posterior frontal and right frontal parietal resection cavities were unremarkable.  Melinda denies any headache, nausea, or vomiting.  Her left arm pain has improved.  Melinda has improved range of motion.  Melinda is currently not taking her Protonix for reflux (not needed).  Melinda has some dysuria.   Past Medical History  Diagnosis Date  . Breast cancer ([place]) 2009  . Seizures ([place])   . Brain cancer ([place]) 2012    Met. from Breast  . Complication of anesthesia     nausea, "drops in potassium and magnesium"    Past Surgical History  Procedure Laterality Date  . Mastectomy Bilateral 2010  . Brain surgery  2012, 2106  . Cesarean section    . Laparoscopic bilateral salpingo oopherectomy Bilateral 05/18/2015    Procedure: LAPAROSCOPIC BILATERAL SALPINGO OOPHORECTOMY;   Surgeon: Will Bonnet, MD;  Location: [facility] ORS;  Service: Gynecology;  Laterality: Bilateral;    Family History  Problem Relation Age of Onset  . Cancer Paternal Aunt   . Cancer Paternal Uncle   . Cancer Paternal Grandfather   . Hypertension Brother   . Diabetes Paternal Grandmother   A paternal aunt had breast cancer age 33, a paternal uncle had prostate cancer, and a paternal grandfather had leukemia.  Social History:  reports that Melinda has never smoked. Melinda does not have any smokeless tobacco history on file. Melinda reports that Melinda does not drink alcohol or use illicit drugs.  Melinda is from [place].  Melinda moved to [place] in 2015.  Melinda moved to [place] in 04/2014.  Melinda recently moved into the [place] area.  Melinda has 2 children (boy and girl). The patient is accompanied by the Spanish interpreter today.  Allergies:  Allergies  Allergen Reactions  . Taxol [Paclitaxel] Anaphylaxis  . Aspirin Swelling  . Penicillins Swelling  . Zofran [Ondansetron Hcl] Nausea And Vomiting    Current Medications: Current Outpatient Prescriptions  Medication Sig Dispense Refill  . omeprazole (PRILOSEC) 40 MG capsule Take 1 capsule by mouth as needed.    Marland Kitchen oxycodone (OXY-IR) 5 MG capsule Take 1 capsule (5 mg total) by mouth every 4 (four) hours as needed. 30 capsule 0  . pantoprazole (PROTONIX) 20 MG tablet Take 1 tablet (20 mg total) by mouth daily. 30 tablet 0  . tamoxifen (NOLVADEX) 20 MG tablet Take  1 tablet (20 mg total) by mouth daily. 90 tablet 1   No current facility-administered medications for this visit.    Review of Systems:  GENERAL:  Feels better.  No fevers or sweats.  Weight up 5 pounds. PERFORMANCE STATUS (ECOG):  1 HEENT:  No visual changes, runny nose, sore throat, mouth sores or tenderness. Lungs: No shortness of breath or cough.  No hemoptysis. Cardiac:  No chest pain, palpitations, orthopnea, or PND. GI:  Appetite 75%.  Reflux, resolved (not currently taking  Protonix).  No nausea, vomiting, diarrhea, constipation, melena or hematochezia. GU:  Dysuria.  No urgency, frequency, or hematuria. Musculoskeletal:  Pain in left upper arm, improved.  Hip pain.  No muscle tenderness. Extremities:  No pain or swelling. Skin:  No rashes, skin changes or ulcers.  Neuro:  No headache, numbness or weakness, balance or coordination issues.  Endocrine:  No diabetes, thyroid issues, hot flashes or night sweats. Psych:  No mood changes, depression or anxiety. Pain:  No focal pain. Review of systems:  All other systems reviewed and found to be negative.  Physical Exam: Last menstrual period 03/19/2015. GENERAL:  Well developed, well nourished, sitting comfortably in the exam room in no acute distress.  Melinda is tearful at times. MENTAL STATUS:  Alert and oriented to person, place and Tanner. HEAD: Long black hair.  Normocephalic, atraumatic, face symmetric, no Cushingoid features. EYES:  Brown eyes.  No conjunctivitis or scleral icterus. EXTREMITIES: Left arm with improved range of motion (90 degrees without discomfort).  No edema, no skin discoloration or tenderness.   NEUROLOGICAL: Unremarkable. PSYCH:  Appropriate.   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 No lymph invasion. Tumor cellularity 20%. [id number]. 07/13/2008: Left mastectomy: Residual ductal carcinoma in situ, high nuclear grade, deep margin negative ypTisNO(sn)MX. 10/25/2010: Abdominal and pelvic ultrasound: questionable lesion near gallbladder fossa recommend MRI follow up. 03/02/2011: Left and right frontal excisional brain biopsy:  Adenocarcinoma, metastatic, NOS: ER +, PR 5% +, Her 2 NEG. 03/04/2011: [facility] testing of IHC4 residual recurrence risk score of 114 showing an 8 year recurrence reate of 57%. ER POSTIVE, PR POSITIVE, HER 2 POSTIVE (previously documented negative in 2009) cutoff of 361 patient scores 426. ki67 71%.  08/05/2013: Right breast FNA supraclavicular region: positive for metastatic carcinoma. ER/PR/HER2 not reported. 08/19/2014: Right cervical lymph node. Adenocarcinoma with features consistent with metastatic breast carcinoma. ER/PR/HER2 insufficient tissue. 09/04/2014: Repeat craniotomy, resection of right frontal tumor (metastatic breast cancer). ER 90-100% PR 51-60% HER2 - 09/29/2014: Right cervical lymph node, metastatic carcinoma c/w breast primary, ER 100% PR 20% HER2-  Imaging: 08/15/2007: Bilateral Mammogram: Dense breasts with solid lesion at 6:00, 7:00, and 9:00 of right breast, solid lesion at 2:00 position left breast BIRADS 4B. 09/10/2007: Breast MRI: Three solid irregular enhancing lesions of the right breast 2.1 x 3.2, 2.8 x 2.7, and 1.8 x 1.6 cm. Mildly enlarged enhancing nodule right axilla. Left breast clumped enhancement midportion suspicious for malignancy. 03/01/2011: Brain MRI: At least two juxtacortical, intra-axial masses with extensive surrounding vasogenic edema most consistent with intracranial metastasis. 07/22/2013: Brain MRI +/- contrast: interval increase in enhancing component on T2 FLAIR now measuring 1.0cm x 1.1 cm worrisome for progression of disease. 01/21/2014:  PET scan: Hypermetabolic small right supraclavicular and right upper paratracheal lymph nodes suspicious for mets. Hypermetabolic lymph node in the right paratracheal upper mediastinum that measure approximately 0.9 x 0.5cm. (of note no impressions made of areas noted on brain MRI 07/22/13).  01/21/2014: MRI Brain: Right frontal enhancing lesion has shown interval increase in size with surrounding edema and local  mass effect (measured 1.8x2.1x1.6, previously 1.2x1.1x1.0) 07/16/2014: Brain MRI:  Right anterior frontal enhancing mass 2.5 x 2.0 cm compatible with a metastatic lesion has increased and changed in configuration from previous 2.0 x 1.8 cm, formerly multilobular. 08/06/2014: PET scan:  Multiple bilateral FDG avid low cervical, supraclavicular, upper mediastinal, and right internal mammary lymphadenopathy, likely representing metastatic disease. A cervical node in the right lower neck should be amenable to percutaneous sampling.  12/2014: Head MRI:  Evolution of right frontal postoperative changes status post tumor resection at the vertex. Expected postoperative changes. Minimal marginal enhancement resection bed with some adjacent posterior intrinsic T1 signal again seen. Decreasing T2/FLAIR adjacent parenchymal changes.  Stable resection cavity is left posterior frontal and right parieto-occipital regions.  Labs:  No visits with results within 3 Day(s) from this visit. Latest known visit with results is:  Appointment on 06/28/2015  Component Date Value Ref Range Status  . WBC 06/28/2015 9.6  3.6 - 11.0 K/uL Final  . RBC 06/28/2015 4.47  3.80 - 5.20 MIL/uL Final  . Hemoglobin 06/28/2015 12.3  12.0 - 16.0 g/dL Final  . HCT 06/28/2015 37.4  35.0 - 47.0 % Final  . MCV 06/28/2015 83.7  80.0 - 100.0 fL Final  . MCH 06/28/2015 27.6  26.0 - 34.0 pg Final  . MCHC 06/28/2015 32.9  32.0 - 36.0 g/dL Final  . RDW 06/28/2015 14.8* 11.5 - 14.5 % Final  . Platelets 06/28/2015 356  150 - 440 K/uL Final  . Neutrophils Relative % 06/28/2015 66   Final  . Neutro Abs 06/28/2015 6.3  1.4 - 6.5 K/uL Final  . Lymphocytes Relative 06/28/2015 23   Final  . Lymphs Abs 06/28/2015 2.2  1.0 - 3.6 K/uL Final  . Monocytes Relative 06/28/2015 9   Final  . Monocytes Absolute 06/28/2015 0.9  0.2 - 0.9 K/uL Final  . Eosinophils Relative 06/28/2015 1   Final  . Eosinophils Absolute 06/28/2015 0.1  0 - 0.7 K/uL Final  .  Basophils Relative 06/28/2015 1   Final  . Basophils Absolute 06/28/2015 0.1  0 - 0.1 K/uL Final  . Sodium 06/28/2015 134* 135 - 145 mmol/L Final  . Potassium 06/28/2015 3.6  3.5 - 5.1 mmol/L Final  . Chloride 06/28/2015 102  101 - 111 mmol/L Final  . CO2 06/28/2015 27  22 - 32 mmol/L Final  . Glucose, Bld 06/28/2015 102* 65 - 99 mg/dL Final  . BUN 06/28/2015 11  6 - 20 mg/dL Final  . Creatinine, Ser 06/28/2015 0.56  0.44 - 1.00 mg/dL Final  . Calcium 06/28/2015 9.1  8.9 - 10.3 mg/dL Final  . Total Protein 06/28/2015 8.3* 6.5 - 8.1 g/dL Final  . Albumin 06/28/2015 4.0  3.5 - 5.0 g/dL Final  . AST 06/28/2015 51* 15 - 41 U/L Final  . ALT 06/28/2015 45  14 - 54 U/L Final  . Alkaline Phosphatase 06/28/2015 123  38 - 126 U/L Final  . Total Bilirubin 06/28/2015 0.4  0.3 - 1.2 mg/dL Final  . GFR calc non Af Amer 06/28/2015 >60  >60 mL/min Final  . GFR calc Af Amer 06/28/2015 >60  >  60 mL/min Final   Comment: (NOTE) The eGFR has been calculated using the CKD EPI equation. This calculation has not been validated in all clinical situations. eGFR's persistently <60 mL/min signify possible Chronic Kidney Disease.   . Anion gap 06/28/2015 5  5 - 15 Final  . CA 27.29 06/28/2015 287.5* 0.0 - 38.6 U/mL Final   Comment: (NOTE) [facility]ayer Centaur/ACS methodology Performed At: [facility] [address] [place], [place] [id number] Lindon Romp MD WP:[id number]     Assessment:  Chessica Audia Wynema Birch is a 45 y.o. 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  Melinda initially presented in 2009 while living in [place] with multi-focal right breast cancer with positive lymph node(s) which was ER+, PR+(low), and HER2/neu+. Melinda received neoadjuvant chemotherapy (AC x 4 every 3 weeks followed by Taxol/Abraxane + carboplatin weekly x 12) without anti-HER2 treatment.    On 07/14/2008, Melinda underwent bilateral mastectomies followed by reconstruction.  Pathology in the right breast  revealed a 1.7 cm grade III invasive ductal carcinoma.   Zero of 11 lymph nodes on the right were positive for malignancy. 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 Deep margin was negative. Zero of 2 lymph nodes were positive for metastatic disease.  Melinda received adjuvant radiation to the right breast.  Melinda received adjuvant tamoxifen and Zoladex.  Melinda could not tolerate symptoms of joint pain and tamoxifen was discontinued after 1-2 months.  Zoladex was continued for approximately 1.5 years.  Melinda was diagnosed with brain metastases in 02/2011.  Pathology revealed ER+, PR+(low), and HER2/neu-.  Melinda underwent resection followed by Cyberknife.  On 03/09/2011, original breast tissue (09/03/2007) sent to [facility] testing.  Testing revealed ER+, PR+(borderline), and HER2/neu positive (different than initial testing).  Melinda received Herceptin for 1 year beginning in 2013.  Melinda then began Tykerb and Xeloda after completion of Herceptin (2014).  Melinda was on extremely low, and probably sub-therapeutic, dosing of lapatinib + capecitabine.   Melinda developed right supraclavicular adenopathy in 2015. Melinda was noted to have slow disease progression in the right frontal/parietal lesion with minimal presence of systemic disease on PET scans. Capecitabine + lapatinib were discontinued in 04/2014.   Melinda underwent repeat craniotomy with resection of brain metastasis on 09/04/2014.  Biopsy of the right supraclavicular lymph node  was ER+,PR+,HER2/neu-.  Melinda started tamoxifen in 09/2014, but discontinued it secondary to pain in hip, back, and shoulder.  PET scan on 04/20/2015 revealed cervical and thoracic nodal metastasis.  There was multifocal osseous metastasis (left transvers T4 process, left humeral head, right iliac wing, and left side of the sacrum).  Incidental findings included right nephrolithiasis.    CA27.29 was 177.8 on 04/16/2015, 276.4 on 05/28/2015, and 287.5 today.  Bone scan on 04/22/2015  corresponded with PET-CT with metastatic lesions evident in the left humeral head, left posterior aspect of T4, left aspect of S1, and the right iliac crest.  There was no other definite foci of metastatic disease to bone.  Uptake in the skull was most suggestive of the previous right frontal craniotomy.  Bone density study on 05/24/2015 revealed a T-score of -0.8 in the right femoral neck (normal).  Melinda restarted tamoxifen on 04/23/2015.  Melinda received Zoladex on 04/30/2015.  Melinda underwent laparoscopic bilateral oophorectomy on 05/18/2015.  Melinda receives monthly Tanner (began 04/30/2015; last 05/28/2015).  Head MRI on 07/07/2015 revealed metastatic breast cancer with 3 resection cavities. The anterior right frontal cavity was positive for nodular marginal enhancement,  new from brain MRI report 01/07/2015, and consistent with recurrent disease.  Symptomatically, Melinda has left arm pain.  Reflux has resolved.  Melinda denies any neurologic symptoms.  Melinda has dysuria.  Plan: 1.  Review head MRI imaging with patient concern for recurrent disease in the anterior right frontal cavity. 2.  Phone follow-up with Caryl Pina coordinator at [facility] neurosurgery [phone]; front desk [phone]; [phone]) 3.  Discuss left arm pain (improved).  Discuss consideration of focal radiation if pain persists/problematic. 4.  Continue tamoxifen.  5.  Urinalysis and culture. 6.  RTC as previously scheduled.   Lequita Asal, MD  07/13/2015, 8:29 AM

## 2015-07-13 NOTE — Progress Notes (Signed)
Pt with no pain today but at times pt has pain in left hip. She states that pain overall has improved that she no longer takes her pain med and because she no longer takes her pain med she does not need prilosec or protonix for reflux. Eating good, no issues with balance, no nausea or vomiting. Here to get MRI results and we are in commuincation with [facility] about if she needs an appt nbased on MRI results

## 2015-07-14 ENCOUNTER — Telehealth: Payer: Self-pay | Admitting: *Deleted

## 2015-07-14 DIAGNOSIS — N390 Urinary tract infection, site not specified: Secondary | ICD-10-CM

## 2015-07-14 MED ORDER — CIPROFLOXACIN HCL 500 MG PO TABS: 500.0000 mg | ORAL_TABLET | Freq: Two times a day (BID) | ORAL | Status: DC

## 2015-07-14 NOTE — Telephone Encounter (Signed)
Called pt with  interpreter asst and asked about her radiation, where she had it at and she states it was [place] and she has some records and she will bring it to [facility] appt 5/3 at 2 pm.  Also she has UTI and she feels like she has has cipro in the past and did ok with it. It will be sent into her pharmacy.  Also she ahs been drinking lots of water and eating cranberries and the painful urination still there in right pelvis area. Notified radiation by voicemail about the above info from previous radiation

## 2015-07-15 LAB — URINE CULTURE: Culture: >=100000 — AB

## 2015-07-21 ENCOUNTER — Ambulatory Visit
Admission: RE | Admit: 2015-07-21 | Discharge: 2015-07-21 | Disposition: A | Discharge status: Home / Self Care | Payer: Medicare HMO | Source: Ambulatory Visit | Attending: Radiation Oncology | Admitting: Radiation Oncology

## 2015-07-21 ENCOUNTER — Encounter: Payer: Self-pay | Admitting: Radiation Oncology

## 2015-07-21 VITALS — BP 120/82 | HR 96 | Temp 97.8°F | Wt 153.3 lb

## 2015-07-21 DIAGNOSIS — Z79810 Long term (current) use of selective estrogen receptor modulators (SERMs): Secondary | ICD-10-CM | POA: Insufficient documentation

## 2015-07-21 DIAGNOSIS — Z79899 Other long term (current) drug therapy: Secondary | ICD-10-CM | POA: Insufficient documentation

## 2015-07-21 DIAGNOSIS — Z170 Estrogen receptor positive status [ER+]: Secondary | ICD-10-CM | POA: Diagnosis not present

## 2015-07-21 DIAGNOSIS — Z90710 Acquired absence of both cervix and uterus: Secondary | ICD-10-CM | POA: Insufficient documentation

## 2015-07-21 DIAGNOSIS — C7951 Secondary malignant neoplasm of bone: Secondary | ICD-10-CM | POA: Insufficient documentation

## 2015-07-21 DIAGNOSIS — C7931 Secondary malignant neoplasm of brain: Secondary | ICD-10-CM

## 2015-07-21 DIAGNOSIS — Z510 Encounter for antineoplastic radiation therapy: Secondary | ICD-10-CM | POA: Insufficient documentation

## 2015-07-21 DIAGNOSIS — Z9013 Acquired absence of bilateral breasts and nipples: Secondary | ICD-10-CM | POA: Insufficient documentation

## 2015-07-21 DIAGNOSIS — Z809 Family history of malignant neoplasm, unspecified: Secondary | ICD-10-CM | POA: Insufficient documentation

## 2015-07-21 DIAGNOSIS — M545 Low back pain: Secondary | ICD-10-CM | POA: Insufficient documentation

## 2015-07-21 DIAGNOSIS — M25512 Pain in left shoulder: Secondary | ICD-10-CM | POA: Insufficient documentation

## 2015-07-21 DIAGNOSIS — Z923 Personal history of irradiation: Secondary | ICD-10-CM | POA: Insufficient documentation

## 2015-07-21 DIAGNOSIS — C50919 Malignant neoplasm of unspecified site of unspecified female breast: Secondary | ICD-10-CM | POA: Insufficient documentation

## 2015-07-21 DIAGNOSIS — Z8669 Personal history of other diseases of the nervous system and sense organs: Secondary | ICD-10-CM | POA: Insufficient documentation

## 2015-07-21 NOTE — Consult Note (Signed)
Except an outstanding is perfect of Radiation Oncology NEW PATIENT EVALUATION  Name: Melinda Tanner  MRN: [id number]  Date:   07/21/2015     DOB: [birth year]   This 45 y.o. female patient presents to the clinic for initial evaluation of recurrent metastatic breast cancer in brain an area of previous CyberKnife resection now with progressive disease by MRI scan in anterior right frontal cavity.  REFERRING PHYSICIAN: Theotis Burrow*  CHIEF COMPLAINT:  Chief Complaint  Patient presents with  . Cancer    Initial evaluation for radiation treatment to the brain    DIAGNOSIS: The encounter diagnosis was Brain metastases ([place]).   PREVIOUS INVESTIGATIONS:  MRI scans PET CT scan and bone scan reviewed Previous radiation treatment parameters reviewed Clinical notes reviewed Pathology report reviewed  HPI: Patient is extremely pleasant 45 year old female seen accompanied by translator today since she is of [place] descent with little understanding of [place]. Her history dates back to 2009 when should bruit presented with HER-2/neu positive breast cancer apparently is had bilateral mastectomies. In 2013 she had 3 separate brain metastasis left frontal right parietal and anterior right frontal all receiving 1600 cGy in 1 fraction of CyberKnife therapy. These treatment were performed in [place] at [facility]. She also has known bone metastasis with a lytic lesion in her left humerus as well as lower lumbar spine for metastatic involvement causing significant pain. She develop recurrent disease in the right frontal lesion which was resected at [facility] again consistent with metastatic breast cancer. She had a right frontal craniotomy and stereotactic computer-assisted cranial intradural microsurgery. Recent MRI scan shows increased enhancement in the resection cavity consistent with progressive disease she is asymptomatic at this time specifically denies headache any  focal motor or sensory loss. She is having significant lower back pain and left shoulder pain. Patient is also had bilateral salpingo-oophorectomy in February 2017. I been asked to evaluate for possible further radiation therapy to the resection cavity in the anterior right frontal region.  PLANNED TREATMENT REGIMEN: I am RT treatment to right anterior frontal lesion with CT MRI fusion study  PAST MEDICAL HISTORY:  has a past medical history of Breast cancer ([place]) (2009); Seizures ([place]); Brain cancer ([place]) (1694); and Complication of anesthesia.    PAST SURGICAL HISTORY:  Past Surgical History  Procedure Laterality Date  . Mastectomy Bilateral 2010  . Brain surgery  2012, 2106  . Cesarean section    . Laparoscopic bilateral salpingo oopherectomy Bilateral 05/18/2015    Procedure: LAPAROSCOPIC BILATERAL SALPINGO OOPHORECTOMY;  Surgeon: Will Bonnet, MD;  Location: [facility] ORS;  Service: Gynecology;  Laterality: Bilateral;    FAMILY HISTORY: family history includes Cancer in her paternal aunt, paternal grandfather, and paternal uncle; Diabetes in her paternal grandmother; Hypertension in her brother.  SOCIAL HISTORY:  reports that she has never smoked. She does not have any smokeless tobacco history on file. She reports that she does not drink alcohol or use illicit drugs.  ALLERGIES: Taxol; Aspirin; Penicillins; and Zofran  MEDICATIONS:  Current Outpatient Prescriptions  Medication Sig Dispense Refill  . ciprofloxacin (CIPRO) 500 MG tablet Take 1 tablet (500 mg total) by mouth 2 (two) times daily. 14 tablet 0  . omeprazole (PRILOSEC) 40 MG capsule Take 1 capsule by mouth as needed.    Marland Kitchen oxycodone (OXY-IR) 5 MG capsule Take 1 capsule (5 mg total) by mouth every 4 (four) hours as needed. 30 capsule 0  . pantoprazole (PROTONIX) 20 MG tablet  Take 1 tablet (20 mg total) by mouth daily. (Patient taking differently: Take 20 mg by mouth daily as needed. ) 30 tablet 0  . tamoxifen (NOLVADEX) 20  MG tablet Take 1 tablet (20 mg total) by mouth daily. 90 tablet 1   No current facility-administered medications for this encounter.    ECOG PERFORMANCE STATUS:  0 - Asymptomatic  REVIEW OF SYSTEMS: Except for the pain and shoulder and lower back Patient denies any weight loss, fatigue, weakness, fever, chills or night sweats. Patient denies any loss of vision, blurred vision. Patient denies any ringing  of the ears or hearing loss. No irregular heartbeat. Patient denies heart murmur or history of fainting. Patient denies any chest pain or pain radiating to her upper extremities. Patient denies any shortness of breath, difficulty breathing at night, cough or hemoptysis. Patient denies any swelling in the lower legs. Patient denies any nausea vomiting, vomiting of blood, or coffee ground material in the vomitus. Patient denies any stomach pain. Patient states has had normal bowel movements no significant constipation or diarrhea. Patient denies any dysuria, hematuria or significant nocturia. Patient denies any problems walking, swelling in the joints or loss of balance. Patient denies any skin changes, loss of hair or loss of weight. Patient denies any excessive worrying or anxiety or significant depression. Patient denies any problems with insomnia. Patient denies excessive thirst, polyuria, polydipsia. Patient denies any swollen glands, patient denies easy bruising or easy bleeding. Patient denies any recent infections, allergies or URI. Patient "s visual fields have not changed significantly in recent time.    PHYSICAL EXAM: BP 120/82 mmHg  Pulse 96  Temp(Src) 97.8 F (36.6 C)  Wt 153 lb 5.3 oz (69.55 kg)  LMP 03/19/2015 Well-developed well-nourished patient in NAD. HEENT reveals PERLA, EOMI, discs not visualized.  Oral cavity is clear. No oral mucosal lesions are identified. Neck is clear without evidence of cervical or supraclavicular adenopathy. Lungs are clear to A&P. Cardiac examination is  essentially unremarkable with regular rate and rhythm without murmur rub or thrill. Abdomen is benign with no organomegaly or masses noted. Motor sensory and DTR levels are equal and symmetric in the upper and lower extremities. Cranial nerves II through XII are grossly intact. Proprioception is intact. No peripheral adenopathy or edema is identified. No motor or sensory levels are noted. Crude visual fields are within normal range.  LABORATORY DATA: Pathology report reviewed    RADIOLOGY RESULTS: MRI scan of brain, PET scan and bone scan all reviewed compatible with the above-stated findings   IMPRESSION: Progressive metastatic breast cancer with recurrence in right anterior resection cavity of brain as well as metastatic disease to lower back and left humerus in 46 year old female with stage IV breast cancer  PLAN: At this time I to go ahead with I MRT radiation therapy to the area of enhancement in her right anterior resection cavity. Would plan on delivering 3000 cGy in 10 fractions. Would use I MRT to spare critical structures such as previously irradiated brain, spinal cord, optic nerve. I also will treat with palliative radiation therapy at some point to her lower back and left humerus. I personally set up CT simulation for early next week. Risks and benefits of treatment including possible brain necrosis from previously radiated area, fatigue, loss of hair, alteration of blood counts all were described in detail to the patient through the interpreter. She and her husband both seem to comprehend my treatment plan well.  I would like to take this opportunity  for allowing me to participate in the care of your patient.Armstead Peaks., MD

## 2015-07-25 ENCOUNTER — Other Ambulatory Visit: Payer: Self-pay | Admitting: Hematology and Oncology

## 2015-07-26 ENCOUNTER — Ambulatory Visit: Payer: Medicare HMO

## 2015-07-26 ENCOUNTER — Inpatient Hospital Stay: Payer: Medicare HMO

## 2015-07-26 ENCOUNTER — Ambulatory Visit
Admission: RE | Admit: 2015-07-26 | Discharge: 2015-07-26 | Disposition: A | Discharge status: Home / Self Care | Payer: Medicare HMO | Source: Ambulatory Visit | Attending: Radiation Oncology | Admitting: Radiation Oncology

## 2015-07-26 ENCOUNTER — Inpatient Hospital Stay: Payer: Medicare HMO | Attending: Hematology and Oncology

## 2015-07-26 ENCOUNTER — Inpatient Hospital Stay (HOSPITAL_BASED_OUTPATIENT_CLINIC_OR_DEPARTMENT_OTHER): Payer: Medicare HMO | Admitting: Hematology and Oncology

## 2015-07-26 VITALS — BP 116/79 | HR 89 | Temp 98.0°F | Resp 18 | Ht 63.0 in | Wt 152.3 lb

## 2015-07-26 DIAGNOSIS — Z809 Family history of malignant neoplasm, unspecified: Secondary | ICD-10-CM | POA: Diagnosis not present

## 2015-07-26 DIAGNOSIS — Z9013 Acquired absence of bilateral breasts and nipples: Secondary | ICD-10-CM

## 2015-07-26 DIAGNOSIS — Z923 Personal history of irradiation: Secondary | ICD-10-CM | POA: Diagnosis not present

## 2015-07-26 DIAGNOSIS — Z79810 Long term (current) use of selective estrogen receptor modulators (SERMs): Secondary | ICD-10-CM | POA: Diagnosis not present

## 2015-07-26 DIAGNOSIS — C50911 Malignant neoplasm of unspecified site of right female breast: Secondary | ICD-10-CM | POA: Diagnosis not present

## 2015-07-26 DIAGNOSIS — C778 Secondary and unspecified malignant neoplasm of lymph nodes of multiple regions: Secondary | ICD-10-CM | POA: Insufficient documentation

## 2015-07-26 DIAGNOSIS — C7931 Secondary malignant neoplasm of brain: Secondary | ICD-10-CM | POA: Insufficient documentation

## 2015-07-26 DIAGNOSIS — Z170 Estrogen receptor positive status [ER+]: Secondary | ICD-10-CM | POA: Diagnosis not present

## 2015-07-26 DIAGNOSIS — C7951 Secondary malignant neoplasm of bone: Secondary | ICD-10-CM

## 2015-07-26 DIAGNOSIS — Z510 Encounter for antineoplastic radiation therapy: Secondary | ICD-10-CM | POA: Diagnosis not present

## 2015-07-26 DIAGNOSIS — Z79899 Other long term (current) drug therapy: Secondary | ICD-10-CM | POA: Diagnosis not present

## 2015-07-26 DIAGNOSIS — Z8669 Personal history of other diseases of the nervous system and sense organs: Secondary | ICD-10-CM | POA: Diagnosis not present

## 2015-07-26 DIAGNOSIS — C50919 Malignant neoplasm of unspecified site of unspecified female breast: Secondary | ICD-10-CM | POA: Diagnosis not present

## 2015-07-26 DIAGNOSIS — M5432 Sciatica, left side: Secondary | ICD-10-CM

## 2015-07-26 DIAGNOSIS — Z90710 Acquired absence of both cervix and uterus: Secondary | ICD-10-CM | POA: Diagnosis not present

## 2015-07-26 DIAGNOSIS — M545 Low back pain: Secondary | ICD-10-CM | POA: Diagnosis not present

## 2015-07-26 DIAGNOSIS — M25512 Pain in left shoulder: Secondary | ICD-10-CM | POA: Diagnosis not present

## 2015-07-26 LAB — CBC WITH DIFFERENTIAL/PLATELET
Basophils Absolute: 0.1 10*3/uL (ref 0–0.1)
Basophils Relative: 1 %
Eosinophils Absolute: 0.1 10*3/uL (ref 0–0.7)
Eosinophils Relative: 1 %
HCT: 36.5 % (ref 35.0–47.0)
Hemoglobin: 12 g/dL (ref 12.0–16.0)
Lymphocytes Relative: 19 %
Lymphs Abs: 2.2 10*3/uL (ref 1.0–3.6)
MCH: 27.5 pg (ref 26.0–34.0)
MCHC: 33 g/dL (ref 32.0–36.0)
MCV: 83.2 fL (ref 80.0–100.0)
Monocytes Absolute: 0.7 10*3/uL (ref 0.2–0.9)
Monocytes Relative: 6 %
Neutro Abs: 8.5 10*3/uL — ABNORMAL HIGH (ref 1.4–6.5)
Neutrophils Relative %: 73 %
Platelets: 454 10*3/uL — ABNORMAL HIGH (ref 150–440)
RBC: 4.38 MIL/uL (ref 3.80–5.20)
RDW: 14.3 % (ref 11.5–14.5)
WBC: 11.6 10*3/uL — ABNORMAL HIGH (ref 3.6–11.0)

## 2015-07-26 LAB — COMPREHENSIVE METABOLIC PANEL
ALT: 26 U/L (ref 14–54)
AST: 30 U/L (ref 15–41)
Albumin: 3.9 g/dL (ref 3.5–5.0)
Alkaline Phosphatase: 138 U/L — ABNORMAL HIGH (ref 38–126)
Anion gap: 8 (ref 5–15)
BUN: 13 mg/dL (ref 6–20)
CO2: 28 mmol/L (ref 22–32)
Calcium: 9.7 mg/dL (ref 8.9–10.3)
Chloride: 104 mmol/L (ref 101–111)
Creatinine, Ser: 0.58 mg/dL (ref 0.44–1.00)
GFR calc Af Amer: >60 mL/min (ref >60)
GFR calc non Af Amer: >60 mL/min (ref >60)
Glucose, Bld: 92 mg/dL (ref 65–99)
Potassium: 3.8 mmol/L (ref 3.5–5.1)
Sodium: 140 mmol/L (ref 135–145)
Total Bilirubin: 0.4 mg/dL (ref 0.3–1.2)
Total Protein: 8.3 g/dL — ABNORMAL HIGH (ref 6.5–8.1)

## 2015-07-26 MED ORDER — DENOSUMAB 120 MG/1.7ML ~~LOC~~ SOLN
120.0000 mg | Freq: Once | SUBCUTANEOUS | Status: AC
  Administered 2015-07-26: 120 mg via SUBCUTANEOUS
  Filled 2015-07-26: qty 1.7

## 2015-07-26 NOTE — Progress Notes (Signed)
Pt reports left lower back pain that radiates down her leg to the knee at times has been worse.  Left shoulder pain has improved.

## 2015-07-26 NOTE — Progress Notes (Signed)
[facility] day:  07/26/2015   Chief Complaint: Melinda Tanner is a 45 y.o. 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  HPI:  The patient was last seen in the medical oncology clinic on 07/13/2015.  At that time, head MRI was reviewed.  Head MRI revealed nodular marginal enhancement in the right frontal cavity.  We discussed referral back to to [facility] radiation oncology for focal radiation.  Contact was made with [facility].  She was seen by by Dr. Baruch Gouty of radiation oncology at [facility] on 07/21/2015.  She is scheduled to receive IMRT in 10 fractions to a dose of 3000 cGy to the area of enhancement.  At last visit, she had some dysuria.  She was diagnosed with a Proteus mirabilis UTI.  She complete a course of ciprofloxacin.  Symptomatically, she notes improving left shoulder pain.  She feels that the pain in her left lower back is worse.  Pain radiates down her leg and to her knee.  She comments that she will be receiving radiation to her left humerus and lower back.  She underwent simulation today.     Past Medical History  Diagnosis Date  . Breast cancer ([place]) 2009  . Seizures ([place])   . Brain cancer ([place]) 2012    Met. from Breast  . Complication of anesthesia     nausea, "drops in potassium and magnesium"    Past Surgical History  Procedure Laterality Date  . Mastectomy Bilateral 2010  . Brain surgery  2012, 2106  . Cesarean section    . Laparoscopic bilateral salpingo oopherectomy Bilateral 05/18/2015    Procedure: LAPAROSCOPIC BILATERAL SALPINGO OOPHORECTOMY;  Surgeon: Will Bonnet, MD;  Location: [facility] ORS;  Service: Gynecology;  Laterality: Bilateral;    Family History  Problem Relation Age of Onset  . Cancer Paternal Aunt   . Cancer Paternal Uncle   . Cancer Paternal Grandfather   . Hypertension Brother   . Diabetes Paternal Grandmother   A paternal aunt had breast  cancer age 19, a paternal uncle had prostate cancer, and a paternal grandfather had leukemia.  Social History:  reports that she has never smoked. She does not have any smokeless tobacco history on file. She reports that she does not drink alcohol or use illicit drugs.  She is from [place].  She moved to [place] in 2015.  She moved to [place] in 04/2014.  She recently moved into the [place] area.  She has 2 children (boy and girl). The patient is accompanied by her husband and the Spanish interpreter today.  Allergies:  Allergies  Allergen Reactions  . Taxol [Paclitaxel] Anaphylaxis  . Aspirin Swelling  . Penicillins Swelling  . Zofran [Ondansetron Hcl] Nausea And Vomiting    Current Medications: Current Outpatient Prescriptions  Medication Sig Dispense Refill  . omeprazole (PRILOSEC) 40 MG capsule Take 1 capsule by mouth as needed.    Marland Kitchen oxycodone (OXY-IR) 5 MG capsule Take 1 capsule (5 mg total) by mouth every 4 (four) hours as needed. 30 capsule 0  . pantoprazole (PROTONIX) 20 MG tablet Take 1 tablet (20 mg total) by mouth daily. (Patient taking differently: Take 20 mg by mouth daily as needed. ) 30 tablet 0  . tamoxifen (NOLVADEX) 20 MG tablet Take 1 tablet (20 mg total) by mouth daily. 90 tablet 1   No current facility-administered medications for this visit.    Review  of Systems:  GENERAL:  Feels "ok".  No fevers or sweats.  Weight down 1 pound. PERFORMANCE STATUS (ECOG):  1 HEENT:  No visual changes, runny nose, sore throat, mouth sores or tenderness. Lungs: No shortness of breath or cough.  No hemoptysis. Cardiac:  No chest pain, palpitations, orthopnea, or PND. GI:  Appetite normal.  No nausea, vomiting, diarrhea, constipation, melena or hematochezia. GU:  Dysuria, resolved.  No urgency, frequency, or hematuria. Musculoskeletal:  Pain in left upper arm, improved.  Back pain, worse.  No muscle tenderness. Extremities:  No pain or swelling. Skin:  No rashes, skin  changes or ulcers.  Neuro:  No headache, numbness or weakness, balance or coordination issues.  Endocrine:  No diabetes, thyroid issues, hot flashes or night sweats. Psych:  No mood changes, depression or anxiety. Pain:  No focal pain. Review of systems:  All other systems reviewed and found to be negative.  Physical Exam: Blood pressure 116/79, pulse 89, temperature 98 F (36.7 C), temperature source Tympanic, resp. rate 18, height 5' 3"  (1.6 m), weight 152 lb 5.4 oz (69.1 kg), last menstrual period 03/19/2015. GENERAL:  Well developed, well nourished, sitting comfortably in the exam room in no acute distress.  She is tearful at times. MENTAL STATUS:  Alert and oriented to person, place and time. HEAD: Wearing a pink and black wrap.  Long black hair.  Normocephalic, atraumatic, face symmetric, no Cushingoid features. EYES:  Brown eyes. Pupils equal round and reactive to light and accomodation.  No conjunctivitis or scleral icterus. ENT:  Oropharynx clear without lesion.  Tongue normal. Mucous membranes moist.  RESPIRATORY:  Clear to auscultation without rales, wheezes or rhonchi. CARDIOVASCULAR:  Regular rate and rhythm without murmur, rub or gallop. ABDOMEN:  Well healed laparoscopic incisions.  Soft, non-tender, with active bowel sounds, and no hepatosplenomegaly.  No masses. BACK:  Pain in lower lumbar/sacral area SKIN:  No rashes, ulcers or lesions. EXTREMITIES: No edema, no skin discoloration or tenderness.  No palpable cords. LYMPH NODES: Pea sized low right cervical node.  No palpable supraclavicular, axillary or inguinal adenopathy  NEUROLOGICAL: Unremarkable. PSYCH:  Appropriate.     Pathology: 09/03/2007: Right breast core biopsy: infiltrating duct cell carcinoma high grade ER/PR (reportedly positive) Her2 (?) 10/02/2007: Right axillary node core biopsy: Fragment with metastatic carcinoma 10/23/2007: Left breast core biopsy: DCIS high grade ER/PR (?) 07/13/2008: Right  mastectomy: Invasive ductal carcinoma Grade III Nottingham score = tubules 3+ Nuclei 3+ mitoses 2+) 1.7 cm size. No lymph invasion. Tumor cellularity 20%. [id number]. 07/13/2008: Left mastectomy: Residual ductal carcinoma in situ, high nuclear grade, deep margin negative ypTisNO(sn)MX. 10/25/2010: Abdominal and pelvic ultrasound: questionable lesion near gallbladder fossa recommend MRI follow up. 03/02/2011: Left and right frontal excisional brain biopsy: Adenocarcinoma, metastatic, NOS: ER +, PR 5% +, Her 2 NEG. 03/04/2011: [facility] testing of IHC4 residual recurrence risk score of 114 showing an 8 year recurrence reate of 57%. ER POSTIVE, PR POSITIVE, HER 2 POSTIVE (previously documented negative in 2009) cutoff of 361 patient scores 426. ki67 71%.  08/05/2013: Right breast FNA supraclavicular region: positive for metastatic carcinoma. ER/PR/HER2 not reported. 08/19/2014: Right cervical lymph node. Adenocarcinoma with features consistent with metastatic breast carcinoma. ER/PR/HER2 insufficient tissue. 09/04/2014: Repeat craniotomy, resection of right frontal tumor (metastatic breast cancer). ER 90-100% PR 51-60% HER2 - 09/29/2014: Right cervical lymph node, metastatic carcinoma c/w breast primary, ER 100% PR 20% HER2-  Imaging: 08/15/2007: Bilateral Mammogram: Dense breasts with solid lesion at 6:00, 7:00, and 9:00 of right  breast, solid lesion at 2:00 position left breast BIRADS 4B. 09/10/2007: Breast MRI: Three solid irregular enhancing lesions of the right breast 2.1 x 3.2, 2.8 x 2.7, and 1.8 x 1.6 cm. Mildly enlarged enhancing nodule right axilla. Left breast clumped enhancement midportion suspicious for malignancy. 03/01/2011: Brain MRI: At least two juxtacortical, intra-axial masses with extensive surrounding vasogenic edema most consistent with intracranial metastasis. 07/22/2013: Brain MRI +/- contrast: interval increase in enhancing component on T2 FLAIR now measuring 1.0cm x 1.1 cm  worrisome for progression of disease. 01/21/2014:  PET scan: Hypermetabolic small right supraclavicular and right upper paratracheal lymph nodes suspicious for mets. Hypermetabolic lymph node in the right paratracheal upper mediastinum that measure approximately 0.9 x 0.5cm. (of note no impressions made of areas noted on brain MRI 07/22/13).  01/21/2014: MRI Brain: Right frontal enhancing lesion has shown interval increase in size with surrounding edema and local mass effect (measured 1.8x2.1x1.6, previously 1.2x1.1x1.0) 07/16/2014: Brain MRI:  Right anterior frontal enhancing mass 2.5 x 2.0 cm compatible with a metastatic lesion has increased and changed in configuration from previous 2.0 x 1.8 cm, formerly multilobular. 08/06/2014: PET scan:  Multiple bilateral FDG avid low cervical, supraclavicular, upper mediastinal, and right internal mammary lymphadenopathy, likely representing metastatic disease. A cervical node in the right lower neck should be amenable to percutaneous sampling.  12/2014: Head MRI:  Evolution of right frontal postoperative changes status post tumor resection at the vertex. Expected postoperative changes. Minimal marginal enhancement resection bed with some adjacent posterior intrinsic T1 signal again seen. Decreasing T2/FLAIR adjacent parenchymal changes.  Stable resection cavity is left posterior frontal and right parieto-occipital regions.  Labs:  Appointment on 07/26/2015  Component Date Value Ref Range Status  . WBC 07/26/2015 11.6* 3.6 - 11.0 K/uL Final  . RBC 07/26/2015 4.38  3.80 - 5.20 MIL/uL Final  . Hemoglobin 07/26/2015 12.0  12.0 - 16.0 g/dL Final  . HCT 07/26/2015 36.5  35.0 - 47.0 % Final  . MCV 07/26/2015 83.2  80.0 - 100.0 fL Final  . MCH 07/26/2015 27.5  26.0 - 34.0 pg Final  . MCHC 07/26/2015 33.0  32.0 - 36.0 g/dL Final  . RDW 07/26/2015 14.3  11.5 - 14.5 % Final  . Platelets 07/26/2015 454* 150 - 440 K/uL Final  . Neutrophils Relative % 07/26/2015 73    Final  . Neutro Abs 07/26/2015 8.5* 1.4 - 6.5 K/uL Final  . Lymphocytes Relative 07/26/2015 19   Final  . Lymphs Abs 07/26/2015 2.2  1.0 - 3.6 K/uL Final  . Monocytes Relative 07/26/2015 6   Final  . Monocytes Absolute 07/26/2015 0.7  0.2 - 0.9 K/uL Final  . Eosinophils Relative 07/26/2015 1   Final  . Eosinophils Absolute 07/26/2015 0.1  0 - 0.7 K/uL Final  . Basophils Relative 07/26/2015 1   Final  . Basophils Absolute 07/26/2015 0.1  0 - 0.1 K/uL Final  . Sodium 07/26/2015 140  135 - 145 mmol/L Final  . Potassium 07/26/2015 3.8  3.5 - 5.1 mmol/L Final  . Chloride 07/26/2015 104  101 - 111 mmol/L Final  . CO2 07/26/2015 28  22 - 32 mmol/L Final  . Glucose, Bld 07/26/2015 92  65 - 99 mg/dL Final  . BUN 07/26/2015 13  6 - 20 mg/dL Final  . Creatinine, Ser 07/26/2015 0.58  0.44 - 1.00 mg/dL Final  . Calcium 07/26/2015 9.7  8.9 - 10.3 mg/dL Final  . Total Protein 07/26/2015 8.3* 6.5 - 8.1 g/dL Final  . Albumin  07/26/2015 3.9  3.5 - 5.0 g/dL Final  . AST 07/26/2015 30  15 - 41 U/L Final  . ALT 07/26/2015 26  14 - 54 U/L Final  . Alkaline Phosphatase 07/26/2015 138* 38 - 126 U/L Final  . Total Bilirubin 07/26/2015 0.4  0.3 - 1.2 mg/dL Final  . GFR calc non Af Amer 07/26/2015 >60  >60 mL/min Final  . GFR calc Af Amer 07/26/2015 >60  >60 mL/min Final   Comment: (NOTE) The eGFR has been calculated using the CKD EPI equation. This calculation has not been validated in all clinical situations. eGFR's persistently <60 mL/min signify possible Chronic Kidney Disease.   . Anion gap 07/26/2015 8  5 - 15 Final    Assessment:  Melinda Tanner is a 45 y.o. 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  She initially presented in 2009 while living in [place] with multi-focal right breast cancer with positive lymph node(s) which was ER+, PR+(low), and HER2/neu+. She received neoadjuvant chemotherapy (AC x 4 every 3 weeks followed by Taxol/Abraxane + carboplatin weekly x 12)  without anti-HER2 treatment.    On 07/14/2008, she underwent bilateral mastectomies followed by reconstruction.  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   Zero of 11 lymph nodes on the right were positive for malignancy. 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 Deep margin was negative. Zero of 2 lymph nodes were positive for metastatic disease.  She received adjuvant radiation to the right breast.  She received adjuvant tamoxifen and Zoladex.  She could not tolerate symptoms of joint pain and tamoxifen was discontinued after 1-2 months.  Zoladex was continued for approximately 1.5 years.  She was diagnosed with brain metastases in 02/2011.  Pathology revealed ER+, PR+(low), and HER2/neu-.  She underwent resection followed by [facility].  On 03/09/2011, original breast tissue (09/03/2007) sent to [facility] testing.  Testing revealed ER+, PR+(borderline), and HER2/neu positive (different than initial testing).  She received Herceptin for 1 year beginning in 2013.  She then began Tykerb and Xeloda after completion of Herceptin (2014).  She was on extremely low, and probably sub-therapeutic, dosing of lapatinib + capecitabine.   She developed right supraclavicular adenopathy in 2015. She was noted to have slow disease progression in the right frontal/parietal lesion with minimal presence of systemic disease on PET scans. Capecitabine + lapatinib were discontinued in 04/2014.   She underwent repeat craniotomy with resection of brain metastasis on 09/04/2014.  Biopsy of the right supraclavicular lymph node  was ER+,PR+,HER2/neu-.  She started tamoxifen in 09/2014, but discontinued it secondary to pain in hip, back, and shoulder.  PET scan on 04/20/2015 revealed cervical and thoracic nodal metastasis.  There was multifocal osseous metastasis (left transvers T4 process, left humeral head, right iliac wing, and left side of the sacrum).  Incidental  findings included right nephrolithiasis.    CA27.29 was 177.8 on 04/16/2015, 276.4 on 05/28/2015, 287.5 on 07/13/2015, and 333.8 on 07/26/2015.  Bone scan on 04/22/2015 corresponded with PET-CT with metastatic lesions evident in the left humeral head, left posterior aspect of T4, left aspect of S1, and the right iliac crest.  There was no other definite foci of metastatic disease to bone.  Uptake in the skull was most suggestive of the previous right frontal craniotomy.  Bone density study on 05/24/2015 revealed a T-score of -0.8 in the right femoral neck (normal).  She restarted tamoxifen on 04/23/2015.  She received  Zoladex on 04/30/2015.  She underwent laparoscopic bilateral oophorectomy on 05/18/2015.  She receives monthly Xgeva (began 04/30/2015; last 05/28/2015).  She was diagnosed with a Proteus mirabilis UTI on 07/13/2015 and was treated with a course of ciprofloxacin.  Head MRI on 07/07/2015 revealed metastatic breast cancer with 3 resection cavities. The anterior right frontal cavity was positive for nodular marginal enhancement, new from brain MRI report 01/07/2015, and consistent with recurrent disease.  Symptomatically, she worsening lumbar pain.  Pain in left upper arm has improved.  She denies lower extremity weakness or numbness.   Plan: 1.  Labs today:  CBC with diff, CMP, CA27.29. 2.  Discuss plans for radiation. 3.  Xgeva today. 4.  Lumbar/sacral MRI:  worsening left sided back pain with sciatica. 5.  Continue tamoxifen. 6.  Follow-up with radiation oncology as scheduled. 7.  RTC after MRI of lumbar/sacral spine. 8.  RTC in 1 month for MD assessment, labs (CBC with diff, CMP, Mg, CA27.29) and Billey Chang, MD  07/26/2015, 4:20 PM

## 2015-07-27 LAB — CANCER ANTIGEN 27.29: CA 27.29: 333.8 U/mL — ABNORMAL HIGH (ref 0.0–38.6)

## 2015-07-28 DIAGNOSIS — M25512 Pain in left shoulder: Secondary | ICD-10-CM | POA: Diagnosis not present

## 2015-07-28 DIAGNOSIS — Z170 Estrogen receptor positive status [ER+]: Secondary | ICD-10-CM | POA: Diagnosis not present

## 2015-07-28 DIAGNOSIS — Z923 Personal history of irradiation: Secondary | ICD-10-CM | POA: Diagnosis not present

## 2015-07-28 DIAGNOSIS — C50919 Malignant neoplasm of unspecified site of unspecified female breast: Secondary | ICD-10-CM | POA: Diagnosis not present

## 2015-07-28 DIAGNOSIS — Z8669 Personal history of other diseases of the nervous system and sense organs: Secondary | ICD-10-CM | POA: Diagnosis not present

## 2015-07-28 DIAGNOSIS — C7931 Secondary malignant neoplasm of brain: Secondary | ICD-10-CM | POA: Diagnosis not present

## 2015-07-28 DIAGNOSIS — M545 Low back pain: Secondary | ICD-10-CM | POA: Diagnosis not present

## 2015-07-28 DIAGNOSIS — Z79810 Long term (current) use of selective estrogen receptor modulators (SERMs): Secondary | ICD-10-CM | POA: Diagnosis not present

## 2015-07-28 DIAGNOSIS — C7951 Secondary malignant neoplasm of bone: Secondary | ICD-10-CM | POA: Diagnosis not present

## 2015-07-28 DIAGNOSIS — Z510 Encounter for antineoplastic radiation therapy: Secondary | ICD-10-CM | POA: Diagnosis not present

## 2015-07-30 ENCOUNTER — Other Ambulatory Visit: Payer: Self-pay | Admitting: *Deleted

## 2015-07-30 DIAGNOSIS — C7931 Secondary malignant neoplasm of brain: Secondary | ICD-10-CM

## 2015-08-02 DIAGNOSIS — C7951 Secondary malignant neoplasm of bone: Secondary | ICD-10-CM | POA: Diagnosis not present

## 2015-08-02 DIAGNOSIS — Z8669 Personal history of other diseases of the nervous system and sense organs: Secondary | ICD-10-CM | POA: Diagnosis not present

## 2015-08-02 DIAGNOSIS — Z510 Encounter for antineoplastic radiation therapy: Secondary | ICD-10-CM | POA: Diagnosis not present

## 2015-08-02 DIAGNOSIS — Z79810 Long term (current) use of selective estrogen receptor modulators (SERMs): Secondary | ICD-10-CM | POA: Diagnosis not present

## 2015-08-02 DIAGNOSIS — C7931 Secondary malignant neoplasm of brain: Secondary | ICD-10-CM | POA: Diagnosis not present

## 2015-08-02 DIAGNOSIS — M25512 Pain in left shoulder: Secondary | ICD-10-CM | POA: Diagnosis not present

## 2015-08-02 DIAGNOSIS — M545 Low back pain: Secondary | ICD-10-CM | POA: Diagnosis not present

## 2015-08-02 DIAGNOSIS — Z170 Estrogen receptor positive status [ER+]: Secondary | ICD-10-CM | POA: Diagnosis not present

## 2015-08-02 DIAGNOSIS — C50919 Malignant neoplasm of unspecified site of unspecified female breast: Secondary | ICD-10-CM | POA: Diagnosis not present

## 2015-08-02 DIAGNOSIS — Z923 Personal history of irradiation: Secondary | ICD-10-CM | POA: Diagnosis not present

## 2015-08-04 ENCOUNTER — Ambulatory Visit
Admission: RE | Admit: 2015-08-04 | Discharge: 2015-08-04 | Disposition: A | Discharge status: Home / Self Care | Payer: Medicare HMO | Source: Ambulatory Visit | Attending: Radiation Oncology | Admitting: Radiation Oncology

## 2015-08-04 ENCOUNTER — Other Ambulatory Visit: Payer: Self-pay | Admitting: *Deleted

## 2015-08-04 DIAGNOSIS — C7931 Secondary malignant neoplasm of brain: Secondary | ICD-10-CM

## 2015-08-04 MED ORDER — DEXAMETHASONE 4 MG PO TABS: 4.0000 mg | ORAL_TABLET | Freq: Every day | ORAL | Status: DC

## 2015-08-05 ENCOUNTER — Ambulatory Visit
Admission: RE | Admit: 2015-08-05 | Discharge: 2015-08-05 | Disposition: A | Discharge status: Home / Self Care | Payer: Medicare HMO | Source: Ambulatory Visit | Attending: Radiation Oncology | Admitting: Radiation Oncology

## 2015-08-05 DIAGNOSIS — Z923 Personal history of irradiation: Secondary | ICD-10-CM | POA: Diagnosis not present

## 2015-08-05 DIAGNOSIS — Z510 Encounter for antineoplastic radiation therapy: Secondary | ICD-10-CM | POA: Diagnosis not present

## 2015-08-05 DIAGNOSIS — Z79810 Long term (current) use of selective estrogen receptor modulators (SERMs): Secondary | ICD-10-CM | POA: Diagnosis not present

## 2015-08-05 DIAGNOSIS — C7931 Secondary malignant neoplasm of brain: Secondary | ICD-10-CM | POA: Diagnosis not present

## 2015-08-05 DIAGNOSIS — C50919 Malignant neoplasm of unspecified site of unspecified female breast: Secondary | ICD-10-CM | POA: Diagnosis not present

## 2015-08-05 DIAGNOSIS — M545 Low back pain: Secondary | ICD-10-CM | POA: Diagnosis not present

## 2015-08-05 DIAGNOSIS — Z8669 Personal history of other diseases of the nervous system and sense organs: Secondary | ICD-10-CM | POA: Diagnosis not present

## 2015-08-05 DIAGNOSIS — C7951 Secondary malignant neoplasm of bone: Secondary | ICD-10-CM | POA: Diagnosis not present

## 2015-08-05 DIAGNOSIS — M25512 Pain in left shoulder: Secondary | ICD-10-CM | POA: Diagnosis not present

## 2015-08-05 DIAGNOSIS — Z170 Estrogen receptor positive status [ER+]: Secondary | ICD-10-CM | POA: Diagnosis not present

## 2015-08-06 ENCOUNTER — Ambulatory Visit
Admission: RE | Admit: 2015-08-06 | Discharge: 2015-08-06 | Disposition: A | Discharge status: Home / Self Care | Payer: Medicare HMO | Source: Ambulatory Visit | Attending: Radiation Oncology | Admitting: Radiation Oncology

## 2015-08-06 DIAGNOSIS — Z510 Encounter for antineoplastic radiation therapy: Secondary | ICD-10-CM | POA: Diagnosis not present

## 2015-08-06 DIAGNOSIS — Z8669 Personal history of other diseases of the nervous system and sense organs: Secondary | ICD-10-CM | POA: Diagnosis not present

## 2015-08-06 DIAGNOSIS — Z170 Estrogen receptor positive status [ER+]: Secondary | ICD-10-CM | POA: Diagnosis not present

## 2015-08-06 DIAGNOSIS — Z923 Personal history of irradiation: Secondary | ICD-10-CM | POA: Diagnosis not present

## 2015-08-06 DIAGNOSIS — Z79810 Long term (current) use of selective estrogen receptor modulators (SERMs): Secondary | ICD-10-CM | POA: Diagnosis not present

## 2015-08-06 DIAGNOSIS — M545 Low back pain: Secondary | ICD-10-CM | POA: Diagnosis not present

## 2015-08-06 DIAGNOSIS — C50919 Malignant neoplasm of unspecified site of unspecified female breast: Secondary | ICD-10-CM | POA: Diagnosis not present

## 2015-08-06 DIAGNOSIS — C7951 Secondary malignant neoplasm of bone: Secondary | ICD-10-CM | POA: Diagnosis not present

## 2015-08-06 DIAGNOSIS — M25512 Pain in left shoulder: Secondary | ICD-10-CM | POA: Diagnosis not present

## 2015-08-06 DIAGNOSIS — C7931 Secondary malignant neoplasm of brain: Secondary | ICD-10-CM | POA: Diagnosis not present

## 2015-08-09 ENCOUNTER — Ambulatory Visit
Admission: RE | Admit: 2015-08-09 | Discharge: 2015-08-09 | Disposition: A | Discharge status: Home / Self Care | Payer: Medicare HMO | Source: Ambulatory Visit | Attending: Radiation Oncology | Admitting: Radiation Oncology

## 2015-08-09 DIAGNOSIS — C50919 Malignant neoplasm of unspecified site of unspecified female breast: Secondary | ICD-10-CM | POA: Diagnosis not present

## 2015-08-09 DIAGNOSIS — M25512 Pain in left shoulder: Secondary | ICD-10-CM | POA: Diagnosis not present

## 2015-08-09 DIAGNOSIS — M545 Low back pain: Secondary | ICD-10-CM | POA: Diagnosis not present

## 2015-08-09 DIAGNOSIS — Z8669 Personal history of other diseases of the nervous system and sense organs: Secondary | ICD-10-CM | POA: Diagnosis not present

## 2015-08-09 DIAGNOSIS — Z79810 Long term (current) use of selective estrogen receptor modulators (SERMs): Secondary | ICD-10-CM | POA: Diagnosis not present

## 2015-08-09 DIAGNOSIS — Z170 Estrogen receptor positive status [ER+]: Secondary | ICD-10-CM | POA: Diagnosis not present

## 2015-08-09 DIAGNOSIS — Z510 Encounter for antineoplastic radiation therapy: Secondary | ICD-10-CM | POA: Diagnosis not present

## 2015-08-09 DIAGNOSIS — C7931 Secondary malignant neoplasm of brain: Secondary | ICD-10-CM | POA: Diagnosis not present

## 2015-08-09 DIAGNOSIS — C7951 Secondary malignant neoplasm of bone: Secondary | ICD-10-CM | POA: Diagnosis not present

## 2015-08-09 DIAGNOSIS — Z923 Personal history of irradiation: Secondary | ICD-10-CM | POA: Diagnosis not present

## 2015-08-10 ENCOUNTER — Inpatient Hospital Stay: Payer: Medicare HMO

## 2015-08-10 ENCOUNTER — Ambulatory Visit
Admission: RE | Admit: 2015-08-10 | Discharge: 2015-08-10 | Disposition: A | Discharge status: Home / Self Care | Payer: Medicare HMO | Source: Ambulatory Visit | Attending: Radiation Oncology | Admitting: Radiation Oncology

## 2015-08-10 DIAGNOSIS — Z170 Estrogen receptor positive status [ER+]: Secondary | ICD-10-CM | POA: Diagnosis not present

## 2015-08-10 DIAGNOSIS — M25512 Pain in left shoulder: Secondary | ICD-10-CM | POA: Diagnosis not present

## 2015-08-10 DIAGNOSIS — Z8669 Personal history of other diseases of the nervous system and sense organs: Secondary | ICD-10-CM | POA: Diagnosis not present

## 2015-08-10 DIAGNOSIS — C7931 Secondary malignant neoplasm of brain: Secondary | ICD-10-CM | POA: Diagnosis not present

## 2015-08-10 DIAGNOSIS — C7951 Secondary malignant neoplasm of bone: Secondary | ICD-10-CM | POA: Diagnosis not present

## 2015-08-10 DIAGNOSIS — C50919 Malignant neoplasm of unspecified site of unspecified female breast: Secondary | ICD-10-CM | POA: Diagnosis not present

## 2015-08-10 DIAGNOSIS — M545 Low back pain: Secondary | ICD-10-CM | POA: Diagnosis not present

## 2015-08-10 DIAGNOSIS — Z923 Personal history of irradiation: Secondary | ICD-10-CM | POA: Diagnosis not present

## 2015-08-10 DIAGNOSIS — Z510 Encounter for antineoplastic radiation therapy: Secondary | ICD-10-CM | POA: Diagnosis not present

## 2015-08-10 DIAGNOSIS — Z79810 Long term (current) use of selective estrogen receptor modulators (SERMs): Secondary | ICD-10-CM | POA: Diagnosis not present

## 2015-08-11 ENCOUNTER — Ambulatory Visit: Payer: Medicare HMO

## 2015-08-11 ENCOUNTER — Ambulatory Visit
Admission: RE | Admit: 2015-08-11 | Discharge: 2015-08-11 | Disposition: A | Discharge status: Home / Self Care | Payer: Medicare HMO | Source: Ambulatory Visit | Attending: Radiation Oncology | Admitting: Radiation Oncology

## 2015-08-11 DIAGNOSIS — M25512 Pain in left shoulder: Secondary | ICD-10-CM | POA: Diagnosis not present

## 2015-08-11 DIAGNOSIS — Z79810 Long term (current) use of selective estrogen receptor modulators (SERMs): Secondary | ICD-10-CM | POA: Diagnosis not present

## 2015-08-11 DIAGNOSIS — Z923 Personal history of irradiation: Secondary | ICD-10-CM | POA: Diagnosis not present

## 2015-08-11 DIAGNOSIS — C7931 Secondary malignant neoplasm of brain: Secondary | ICD-10-CM | POA: Diagnosis not present

## 2015-08-11 DIAGNOSIS — M545 Low back pain: Secondary | ICD-10-CM | POA: Diagnosis not present

## 2015-08-11 DIAGNOSIS — C7951 Secondary malignant neoplasm of bone: Secondary | ICD-10-CM | POA: Diagnosis not present

## 2015-08-11 DIAGNOSIS — C50919 Malignant neoplasm of unspecified site of unspecified female breast: Secondary | ICD-10-CM | POA: Diagnosis not present

## 2015-08-11 DIAGNOSIS — Z510 Encounter for antineoplastic radiation therapy: Secondary | ICD-10-CM | POA: Diagnosis not present

## 2015-08-11 DIAGNOSIS — Z170 Estrogen receptor positive status [ER+]: Secondary | ICD-10-CM | POA: Diagnosis not present

## 2015-08-11 DIAGNOSIS — Z8669 Personal history of other diseases of the nervous system and sense organs: Secondary | ICD-10-CM | POA: Diagnosis not present

## 2015-08-12 ENCOUNTER — Ambulatory Visit
Admission: RE | Admit: 2015-08-12 | Discharge: 2015-08-12 | Disposition: A | Discharge status: Home / Self Care | Payer: Medicare HMO | Source: Ambulatory Visit | Attending: Hematology and Oncology | Admitting: Hematology and Oncology

## 2015-08-12 ENCOUNTER — Ambulatory Visit: Payer: Medicare HMO

## 2015-08-12 ENCOUNTER — Ambulatory Visit
Admission: RE | Admit: 2015-08-12 | Discharge: 2015-08-12 | Disposition: A | Discharge status: Home / Self Care | Payer: Medicare HMO | Source: Ambulatory Visit | Attending: Radiation Oncology | Admitting: Radiation Oncology

## 2015-08-12 DIAGNOSIS — M4806 Spinal stenosis, lumbar region: Secondary | ICD-10-CM | POA: Insufficient documentation

## 2015-08-12 DIAGNOSIS — Z8669 Personal history of other diseases of the nervous system and sense organs: Secondary | ICD-10-CM | POA: Diagnosis not present

## 2015-08-12 DIAGNOSIS — M545 Low back pain: Secondary | ICD-10-CM | POA: Diagnosis not present

## 2015-08-12 DIAGNOSIS — M5432 Sciatica, left side: Secondary | ICD-10-CM | POA: Diagnosis not present

## 2015-08-12 DIAGNOSIS — C7931 Secondary malignant neoplasm of brain: Secondary | ICD-10-CM | POA: Diagnosis not present

## 2015-08-12 DIAGNOSIS — Z510 Encounter for antineoplastic radiation therapy: Secondary | ICD-10-CM | POA: Diagnosis not present

## 2015-08-12 DIAGNOSIS — Z170 Estrogen receptor positive status [ER+]: Secondary | ICD-10-CM | POA: Diagnosis not present

## 2015-08-12 DIAGNOSIS — C7951 Secondary malignant neoplasm of bone: Secondary | ICD-10-CM | POA: Diagnosis not present

## 2015-08-12 DIAGNOSIS — C50911 Malignant neoplasm of unspecified site of right female breast: Secondary | ICD-10-CM | POA: Diagnosis not present

## 2015-08-12 DIAGNOSIS — C50919 Malignant neoplasm of unspecified site of unspecified female breast: Secondary | ICD-10-CM | POA: Diagnosis not present

## 2015-08-12 DIAGNOSIS — Z79810 Long term (current) use of selective estrogen receptor modulators (SERMs): Secondary | ICD-10-CM | POA: Diagnosis not present

## 2015-08-12 DIAGNOSIS — M25512 Pain in left shoulder: Secondary | ICD-10-CM | POA: Diagnosis not present

## 2015-08-12 DIAGNOSIS — Z923 Personal history of irradiation: Secondary | ICD-10-CM | POA: Diagnosis not present

## 2015-08-12 MED ORDER — GADOBENATE DIMEGLUMINE 529 MG/ML IV SOLN
15.0000 mL | Freq: Once | INTRAVENOUS | Status: AC | PRN
  Administered 2015-08-12: 15 mL via INTRAVENOUS

## 2015-08-13 ENCOUNTER — Ambulatory Visit
Admission: RE | Admit: 2015-08-13 | Discharge: 2015-08-13 | Disposition: A | Discharge status: Home / Self Care | Payer: Medicare HMO | Source: Ambulatory Visit | Attending: Radiation Oncology | Admitting: Radiation Oncology

## 2015-08-13 ENCOUNTER — Inpatient Hospital Stay (HOSPITAL_BASED_OUTPATIENT_CLINIC_OR_DEPARTMENT_OTHER): Payer: Medicare HMO | Admitting: Hematology and Oncology

## 2015-08-13 ENCOUNTER — Encounter: Payer: Self-pay | Admitting: Hematology and Oncology

## 2015-08-13 VITALS — BP 131/92 | HR 84 | Temp 97.0°F | Resp 18 | Ht 63.0 in | Wt 152.3 lb

## 2015-08-13 DIAGNOSIS — Z9013 Acquired absence of bilateral breasts and nipples: Secondary | ICD-10-CM

## 2015-08-13 DIAGNOSIS — Z170 Estrogen receptor positive status [ER+]: Secondary | ICD-10-CM | POA: Diagnosis not present

## 2015-08-13 DIAGNOSIS — C778 Secondary and unspecified malignant neoplasm of lymph nodes of multiple regions: Secondary | ICD-10-CM

## 2015-08-13 DIAGNOSIS — C7951 Secondary malignant neoplasm of bone: Secondary | ICD-10-CM | POA: Diagnosis not present

## 2015-08-13 DIAGNOSIS — Z79899 Other long term (current) drug therapy: Secondary | ICD-10-CM

## 2015-08-13 DIAGNOSIS — Z8669 Personal history of other diseases of the nervous system and sense organs: Secondary | ICD-10-CM | POA: Diagnosis not present

## 2015-08-13 DIAGNOSIS — C50911 Malignant neoplasm of unspecified site of right female breast: Secondary | ICD-10-CM

## 2015-08-13 DIAGNOSIS — Z923 Personal history of irradiation: Secondary | ICD-10-CM | POA: Diagnosis not present

## 2015-08-13 DIAGNOSIS — Z79810 Long term (current) use of selective estrogen receptor modulators (SERMs): Secondary | ICD-10-CM | POA: Diagnosis not present

## 2015-08-13 DIAGNOSIS — C7931 Secondary malignant neoplasm of brain: Secondary | ICD-10-CM | POA: Diagnosis not present

## 2015-08-13 DIAGNOSIS — C50919 Malignant neoplasm of unspecified site of unspecified female breast: Secondary | ICD-10-CM | POA: Diagnosis not present

## 2015-08-13 DIAGNOSIS — G4700 Insomnia, unspecified: Secondary | ICD-10-CM

## 2015-08-13 DIAGNOSIS — Z510 Encounter for antineoplastic radiation therapy: Secondary | ICD-10-CM | POA: Diagnosis not present

## 2015-08-13 DIAGNOSIS — M25512 Pain in left shoulder: Secondary | ICD-10-CM | POA: Diagnosis not present

## 2015-08-13 DIAGNOSIS — M545 Low back pain: Secondary | ICD-10-CM | POA: Diagnosis not present

## 2015-08-13 MED ORDER — PALBOCICLIB 125 MG PO CAPS: ORAL_CAPSULE | ORAL | Status: DC

## 2015-08-13 MED ORDER — PALBOCICLIB 125 MG PO CAPS: 125.0000 mg | ORAL_CAPSULE | Freq: Every day | ORAL | Status: DC

## 2015-08-13 NOTE — Progress Notes (Signed)
[facility] day:  08/13/2015   Chief Complaint: Melinda Tanner is a 45 y.o. 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   HPI:  The patient was last seen in the medical oncology clinic on 07/26/2015.  At that time, she had worsening lumbar pain.  Pain in left upper arm had improved.  She denied lower extremity weakness or numbness.  CA27.29 had increased to 333.8.  She received Xgeva.  Imaging of her lumbar spine was scheduled.  She had just undergone simulation for Her CNS radiation.  Lumbar spine MRI on 08/12/2015 revealed osseous metastatic disease at L3, L4, L5, S1, and in the left iliac bone posteriorly. I do not see definite epidural metastatic disease at this time.  Borderline right subarticular lateral recess stenosis at L4-5 due to disc bulge.  She states that she has received 7 of 10 radiation fractions.  She will be evaluated soon for possible radiation to her left arm and back.  She stats that she is having insomnia because of the Decadron.  Symptomatically, her pain is well controlled today.  She notices a change in her sweating on tamoxifen.   Past Medical History  Diagnosis Date  . Breast cancer ([place]) 2009  . Seizures ([place])   . Brain cancer ([place]) 2012    Met. from Breast  . Complication of anesthesia     nausea, "drops in potassium and magnesium"    Past Surgical History  Procedure Laterality Date  . Mastectomy Bilateral 2010  . Brain surgery  2012, 2106  . Cesarean section    . Laparoscopic bilateral salpingo oopherectomy Bilateral 05/18/2015    Procedure: LAPAROSCOPIC BILATERAL SALPINGO OOPHORECTOMY;  Surgeon: Will Bonnet, MD;  Location: [facility] ORS;  Service: Gynecology;  Laterality: Bilateral;    Family History  Problem Relation Age of Onset  . Cancer Paternal Aunt   . Cancer Paternal  Uncle   . Cancer Paternal Grandfather   . Hypertension Brother   . Diabetes Paternal Grandmother   A paternal aunt had breast cancer age 6, a paternal uncle had prostate cancer, and a paternal grandfather had leukemia.  Social History:  reports that she has never smoked. She does not have any smokeless tobacco history on file. She reports that she does not drink alcohol or use illicit drugs.  She is from [place].  She moved to [place] in 2015.  She moved to [place] in 04/2014.  She recently moved into the [place] area.  She has 2 children (boy and girl). The patient is accompanied by her husband and the Spanish interpreter today.  Allergies:  Allergies  Allergen Reactions  . Taxol [Paclitaxel] Anaphylaxis  . Aspirin Swelling  . Penicillins Swelling  . Zofran [Ondansetron Hcl] Nausea And Vomiting    Current Medications: Current Outpatient Prescriptions  Medication Sig Dispense Refill  . dexamethasone (DECADRON) 4 MG tablet Take 1 tablet (4 mg total) by mouth daily. 20 tablet 0  . pantoprazole (PROTONIX) 20 MG tablet Take 1 tablet (20 mg total) by mouth daily. (Patient taking differently: Take 20 mg by mouth daily as needed. ) 30 tablet 0  . tamoxifen (NOLVADEX) 20 MG tablet Take 1 tablet (20 mg total) by mouth daily. 90 tablet 1  . omeprazole (PRILOSEC) 40 MG capsule Take 1 capsule by mouth as needed. Reported on 08/13/2015    .  oxycodone (OXY-IR) 5 MG capsule Take 1 capsule (5 mg total) by mouth every 4 (four) hours as needed. (Patient not taking: Reported on 08/13/2015) 30 capsule 0   No current facility-administered medications for this visit.    Review of Systems:  GENERAL:  Feels "ok".  No fevers or sweats.  Weight stable. PERFORMANCE STATUS (ECOG):  1 HEENT:  No visual changes, runny nose, sore throat, mouth sores or tenderness. Lungs: No shortness of breath or cough.  No hemoptysis. Cardiac:  No chest pain, palpitations, orthopnea, or PND. GI:  Appetite normal.   No nausea, vomiting, diarrhea, constipation, melena or hematochezia. GU:  No urgency, frequency, dysuria or hematuria. Musculoskeletal:  Pain in left upper arm, improved.  Back pain, stable.  No muscle tenderness. Extremities:  No pain or swelling. Skin:  No rashes, skin changes or ulcers.  Neuro:  No headache, numbness or weakness, balance or coordination issues.  Endocrine:  No diabetes, thyroid issues, hot flashes or night sweats. Psych:  Insomnia secondary to Decadron.  No mood changes, depression or anxiety. Pain:  No focal pain. Review of systems:  All other systems reviewed and found to be negative.  Physical Exam: Blood pressure 131/92, pulse 84, temperature 97 F (36.1 C), temperature source Tympanic, resp. rate 18, height 5' 3"  (1.6 m), weight 152 lb 5.4 oz (69.1 kg), last menstrual period 03/19/2015. GENERAL:  Well developed, well nourished, sitting comfortably in the exam room in no acute distress.  She is tearful at times. MENTAL STATUS:  Alert and oriented to person, place and time. HEAD: Long black hair.  Normocephalic, atraumatic, face symmetric, no Cushingoid features. EYES:  Brown eyes. Pupils equal round and reactive to light and accomodation.  No conjunctivitis or scleral icterus. ENT:  Oropharynx clear without lesion.  Tongue normal. Mucous membranes moist.  RESPIRATORY:  Clear to auscultation without rales, wheezes or rhonchi. CARDIOVASCULAR:  Regular rate and rhythm without murmur, rub or gallop. ABDOMEN:  Well healed laparoscopic incisions.  Soft, non-tender, with active bowel sounds, and no hepatosplenomegaly.  No masses. BACK:  Pain in lower lumbar/sacral area SKIN:  No rashes, ulcers or lesions. EXTREMITIES: No edema, no skin discoloration or tenderness.  No palpable cords. LYMPH NODES: 1 cm low right cervical node (increased).  No palpable supraclavicular, axillary or inguinal adenopathy  NEUROLOGICAL:  Alert & oriented, cranial nerves II-XII grossly intact;  motor strength 5/5 lower extremities; sensation intact; bilateral patellar reflexes 1+; gait normal. PSYCH:  Appropriate.     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 No lymph invasion. Tumor cellularity 20%. [id number]. 07/13/2008: Left mastectomy: Residual ductal carcinoma in situ, high nuclear grade, deep margin negative ypTisNO(sn)MX. 10/25/2010: Abdominal and pelvic ultrasound: questionable lesion near gallbladder fossa recommend MRI follow up. 03/02/2011: Left and right frontal excisional brain biopsy: Adenocarcinoma, metastatic, NOS: ER +, PR 5% +, Her 2 NEG. 03/04/2011: [facility] testing of IHC4 residual recurrence risk score of 114 showing an 8 year recurrence reate of 57%. ER POSTIVE, PR POSITIVE, HER 2 POSTIVE (previously documented negative in 2009) cutoff of 361 patient scores 426. ki67 71%.  08/05/2013: Right breast FNA supraclavicular region: positive for metastatic carcinoma. ER/PR/HER2 not reported. 08/19/2014: Right cervical lymph node. Adenocarcinoma with features  consistent with metastatic breast carcinoma. ER/PR/HER2 insufficient tissue. 09/04/2014: Repeat craniotomy, resection of right frontal tumor (metastatic breast cancer). ER 90-100% PR 51-60% HER2 - 09/29/2014: Right cervical lymph node, metastatic carcinoma c/w breast primary, ER 100% PR 20% HER2-  Imaging: 08/15/2007: Bilateral Mammogram: Dense breasts with solid lesion at 6:00, 7:00, and 9:00 of right breast, solid lesion at 2:00 position left breast BIRADS 4B. 09/10/2007: Breast MRI: Three solid irregular enhancing lesions of the right breast 2.1 x 3.2, 2.8 x  2.7, and 1.8 x 1.6 cm. Mildly enlarged enhancing nodule right axilla. Left breast clumped enhancement midportion suspicious for malignancy. 03/01/2011: Brain MRI: At least two juxtacortical, intra-axial masses with extensive surrounding vasogenic edema most consistent with intracranial metastasis. 07/22/2013: Brain MRI +/- contrast: interval increase in enhancing component on T2 FLAIR now measuring 1.0cm x 1.1 cm worrisome for progression of disease. 01/21/2014:  PET scan: Hypermetabolic small right supraclavicular and right upper paratracheal lymph nodes suspicious for mets. Hypermetabolic lymph node in the right paratracheal upper mediastinum that measure approximately 0.9 x 0.5cm. (of note no impressions made of areas noted on brain MRI 07/22/13).  01/21/2014: MRI Brain: Right frontal enhancing lesion has shown interval increase in size with surrounding edema and local mass effect (measured 1.8x2.1x1.6, previously 1.2x1.1x1.0) 07/16/2014: Brain MRI:  Right anterior frontal enhancing mass 2.5 x 2.0 cm compatible with a metastatic lesion has increased and changed in configuration from previous 2.0 x 1.8 cm, formerly multilobular. 08/06/2014: PET scan:  Multiple bilateral FDG avid low cervical, supraclavicular, upper mediastinal, and right internal mammary lymphadenopathy, likely representing metastatic disease. A cervical node in the right lower neck should be amenable to percutaneous sampling.  12/2014: Head MRI:  Evolution of right frontal postoperative changes status post tumor resection at the vertex. Expected postoperative changes. Minimal marginal enhancement resection bed with some adjacent posterior intrinsic T1 signal again seen. Decreasing T2/FLAIR adjacent parenchymal changes.  Stable resection cavity is left posterior frontal and right parieto-occipital regions.  Labs:  No visits with results within 3 Day(s) from this visit. Latest known visit with results is:  Appointment on 07/26/2015   Component Date Value Ref Range Status  . WBC 07/26/2015 11.6* 3.6 - 11.0 K/uL Final  . RBC 07/26/2015 4.38  3.80 - 5.20 MIL/uL Final  . Hemoglobin 07/26/2015 12.0  12.0 - 16.0 g/dL Final  . HCT 07/26/2015 36.5  35.0 - 47.0 % Final  . MCV 07/26/2015 83.2  80.0 - 100.0 fL Final  . MCH 07/26/2015 27.5  26.0 - 34.0 pg Final  . MCHC 07/26/2015 33.0  32.0 - 36.0 g/dL Final  . RDW 07/26/2015 14.3  11.5 - 14.5 % Final  . Platelets 07/26/2015 454* 150 - 440 K/uL Final  . Neutrophils Relative % 07/26/2015 73   Final  . Neutro Abs 07/26/2015 8.5* 1.4 - 6.5 K/uL Final  . Lymphocytes Relative 07/26/2015 19   Final  . Lymphs Abs 07/26/2015 2.2  1.0 - 3.6 K/uL Final  . Monocytes Relative 07/26/2015 6   Final  . Monocytes Absolute 07/26/2015 0.7  0.2 - 0.9 K/uL Final  . Eosinophils Relative 07/26/2015 1   Final  . Eosinophils Absolute 07/26/2015 0.1  0 - 0.7 K/uL Final  . Basophils Relative 07/26/2015 1   Final  . Basophils Absolute 07/26/2015 0.1  0 - 0.1 K/uL Final  . Sodium 07/26/2015 140  135 - 145 mmol/L Final  . Potassium 07/26/2015 3.8  3.5 - 5.1 mmol/L Final  . Chloride 07/26/2015 104  101 -  111 mmol/L Final  . CO2 07/26/2015 28  22 - 32 mmol/L Final  . Glucose, Bld 07/26/2015 92  65 - 99 mg/dL Final  . BUN 07/26/2015 13  6 - 20 mg/dL Final  . Creatinine, Ser 07/26/2015 0.58  0.44 - 1.00 mg/dL Final  . Calcium 07/26/2015 9.7  8.9 - 10.3 mg/dL Final  . Total Protein 07/26/2015 8.3* 6.5 - 8.1 g/dL Final  . Albumin 07/26/2015 3.9  3.5 - 5.0 g/dL Final  . AST 07/26/2015 30  15 - 41 U/L Final  . ALT 07/26/2015 26  14 - 54 U/L Final  . Alkaline Phosphatase 07/26/2015 138* 38 - 126 U/L Final  . Total Bilirubin 07/26/2015 0.4  0.3 - 1.2 mg/dL Final  . GFR calc non Af Amer 07/26/2015 >60  >60 mL/min Final  . GFR calc Af Amer 07/26/2015 >60  >60 mL/min Final   Comment: (NOTE) The eGFR has been calculated using the CKD EPI equation. This calculation has not been validated in all clinical  situations. eGFR's persistently <60 mL/min signify possible Chronic Kidney Disease.   . Anion gap 07/26/2015 8  5 - 15 Final  . CA 27.29 07/26/2015 333.8* 0.0 - 38.6 U/mL Final   Comment: (NOTE) [employer] [facility]/ACS methodology Performed At: [facility] [address] [place], [place] [id number] Lindon Romp MD SK:[id number]     Assessment:  Melinda Tanner is a 45 y.o. 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  She initially presented in 2009 while living in [place] with multi-focal right breast cancer with positive lymph node(s) which was ER+, PR+(low), and HER2/neu+. She received neoadjuvant chemotherapy (AC x 4 every 3 weeks followed by Taxol/Abraxane + carboplatin weekly x 12) without anti-HER2 treatment.    On 07/14/2008, she underwent bilateral mastectomies followed by reconstruction.  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   Zero of 11 lymph nodes on the right were positive for malignancy. 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 Deep margin was negative. Zero of 2 lymph nodes were positive for metastatic disease.  She received adjuvant radiation to the right breast.  She received adjuvant tamoxifen and Zoladex.  She could not tolerate symptoms of joint pain and tamoxifen was discontinued after 1-2 months.  Zoladex was continued for approximately 1.5 years.  She was diagnosed with brain metastases in 02/2011.  Pathology revealed ER+, PR+(low), and HER2/neu-.  She underwent resection followed by [facility].  On 03/09/2011, original breast tissue (09/03/2007) sent to [facility] testing.  Testing revealed ER+, PR+(borderline), and HER2/neu positive (different than initial testing).  She received Herceptin for 1 year beginning in 2013.  She then began Tykerb and Xeloda after completion of Herceptin (2014).  She was on extremely low, and probably sub-therapeutic, dosing of lapatinib +  capecitabine.   She developed right supraclavicular adenopathy in 2015. She was noted to have slow disease progression in the right frontal/parietal lesion with minimal presence of systemic disease on PET scans. Capecitabine + lapatinib were discontinued in 04/2014.   She underwent repeat craniotomy with resection of brain metastasis on 09/04/2014.  Biopsy of the right supraclavicular lymph node  was ER+,PR+,HER2/neu-.  She started tamoxifen in 09/2014, but discontinued it secondary to pain in hip, back, and shoulder.  PET scan on 04/20/2015 revealed cervical and thoracic nodal metastasis.  There was multifocal osseous metastasis (left transvers T4 process, left humeral head, right iliac wing, and left side of  the sacrum).  Incidental findings included right nephrolithiasis.    CA27.29 was 177.8 on 04/16/2015, 276.4 on 05/28/2015, 287.5 on 07/13/2015, and 333.8 on 07/26/2015.  Bone scan on 04/22/2015 corresponded with PET-CT with metastatic lesions evident in the left humeral head, left posterior aspect of T4, left aspect of S1, and the right iliac crest.  There was no other definite foci of metastatic disease to bone.  Uptake in the skull was most suggestive of the previous right frontal craniotomy.  Bone density study on 05/24/2015 revealed a T-score of -0.8 in the right femoral neck (normal).  She restarted tamoxifen on 04/23/2015.  She received Zoladex on 04/30/2015.  She underwent laparoscopic bilateral oophorectomy on 05/18/2015.  She receives monthly Xgeva (began 04/30/2015; last 07/26/2015).  She was diagnosed with a Proteus mirabilis UTI on 07/13/2015 and was treated with a course of ciprofloxacin.  Head MRI on 07/07/2015 revealed metastatic breast cancer with 3 resection cavities. The anterior right frontal cavity was positive for nodular marginal enhancement, new from brain MRI report 01/07/2015, and consistent with recurrent disease.  She is currently undergoing radiation.  Lumbar  spine MRI on 08/12/2015 revealed osseous metastatic disease at L3, L4, L5, S1, and in the left iliac bone posteriorly. There was no definite epidural metastatic disease.  Symptomatically, she has increased lower back pain.  Imaging reveals progressive disease.  Exam reveals an increasing low right cervical node.  Plan: 1.  Review lumbar/sacral MRI.  Discuss progressive disease.  Bone scan and MRI images reviewed with patient. 2.  Discuss plan to switch tamoxifen to Faslodex + Ibrance.  Information provided. 3.  Schedule PET scan: restaging. 4.  Continue tamoxifen until Faslodex starts. 5.  Preauth Faslodex with load and Ibrance 125 mg po q day (day 1-21 with 7 days off). 6.  Continue CNS radiation. 7.  Phone follow-up with Dr. Baruch Gouty re: radiation plan- done. 8.  Continue Xgeva monthly (next due 08/23/2015). 9.  Switch Decadron to AM to help with sleep. 10.  Anticipate initiation of Ibrance when radiation complete. 11.  RTC on 08/23/2015 for MD assess, labs (CBC with diff, CMP, CA27.29), Xgeva, and initiation of Faslodex   Lequita Asal, MD  08/13/2015, 11:07 AM

## 2015-08-13 NOTE — Progress Notes (Signed)
Pt reports that since she started the dexamethasone she cannot sleep at night and sometimes only sleeps an hour.  Pt reports sweat smells heavier or worse and is it related tamoxifen.

## 2015-08-14 ENCOUNTER — Encounter: Payer: Self-pay | Admitting: Hematology and Oncology

## 2015-08-17 ENCOUNTER — Encounter: Payer: Self-pay | Admitting: *Deleted

## 2015-08-17 ENCOUNTER — Ambulatory Visit
Admission: RE | Admit: 2015-08-17 | Discharge: 2015-08-17 | Disposition: A | Discharge status: Home / Self Care | Payer: Medicare HMO | Source: Ambulatory Visit | Attending: Radiation Oncology | Admitting: Radiation Oncology

## 2015-08-17 DIAGNOSIS — Z79810 Long term (current) use of selective estrogen receptor modulators (SERMs): Secondary | ICD-10-CM | POA: Diagnosis not present

## 2015-08-17 DIAGNOSIS — C50919 Malignant neoplasm of unspecified site of unspecified female breast: Secondary | ICD-10-CM | POA: Diagnosis not present

## 2015-08-17 DIAGNOSIS — Z923 Personal history of irradiation: Secondary | ICD-10-CM | POA: Diagnosis not present

## 2015-08-17 DIAGNOSIS — Z170 Estrogen receptor positive status [ER+]: Secondary | ICD-10-CM | POA: Diagnosis not present

## 2015-08-17 DIAGNOSIS — Z510 Encounter for antineoplastic radiation therapy: Secondary | ICD-10-CM | POA: Diagnosis not present

## 2015-08-17 DIAGNOSIS — M545 Low back pain: Secondary | ICD-10-CM | POA: Diagnosis not present

## 2015-08-17 DIAGNOSIS — Z8669 Personal history of other diseases of the nervous system and sense organs: Secondary | ICD-10-CM | POA: Diagnosis not present

## 2015-08-17 DIAGNOSIS — C7951 Secondary malignant neoplasm of bone: Secondary | ICD-10-CM | POA: Diagnosis not present

## 2015-08-17 DIAGNOSIS — M25512 Pain in left shoulder: Secondary | ICD-10-CM | POA: Diagnosis not present

## 2015-08-17 DIAGNOSIS — C7931 Secondary malignant neoplasm of brain: Secondary | ICD-10-CM | POA: Diagnosis not present

## 2015-08-17 NOTE — Progress Notes (Unsigned)
Pt came today after radiation appt to have teaching for Ibrance.  Pt was given teaching info on last Friday in [place] and [place].  She was going to read over it over the weekend and then I would teach her today.  She had translator in room with me.  I went over the instructions on how to take medication, swallow whole within 30 min of eating or right before she eats and drink a glass of water with it.  Daily tablet for 21 days and 1 week off and repeat cycle.  She was told to avoid grapefruit grapefruit juice- pt aware but does not like either the fruit or the juice. Went over [employer]. Side effects, that we will monitor labs when she is on treatment.  Things to do to keep best health while on med.  Gave her the chemo teaching papers and asked her questions about basic info about drug and pt verbalized understanding of all above. Signed consent for ibrance and it was faxed to [facility] today. Pt was concerned about financial cost of drug and I advised her that if it is too expensive and most times is then she should ask person on phone to asst for pt. copay asst.  And she can call me if she has questions.

## 2015-08-18 ENCOUNTER — Ambulatory Visit
Admission: RE | Admit: 2015-08-18 | Discharge: 2015-08-18 | Disposition: A | Discharge status: Home / Self Care | Payer: Medicare HMO | Source: Ambulatory Visit | Attending: Radiation Oncology | Admitting: Radiation Oncology

## 2015-08-18 DIAGNOSIS — Z510 Encounter for antineoplastic radiation therapy: Secondary | ICD-10-CM | POA: Diagnosis not present

## 2015-08-18 DIAGNOSIS — Z8669 Personal history of other diseases of the nervous system and sense organs: Secondary | ICD-10-CM | POA: Diagnosis not present

## 2015-08-18 DIAGNOSIS — C50919 Malignant neoplasm of unspecified site of unspecified female breast: Secondary | ICD-10-CM | POA: Diagnosis not present

## 2015-08-18 DIAGNOSIS — M25512 Pain in left shoulder: Secondary | ICD-10-CM | POA: Diagnosis not present

## 2015-08-18 DIAGNOSIS — Z79810 Long term (current) use of selective estrogen receptor modulators (SERMs): Secondary | ICD-10-CM | POA: Diagnosis not present

## 2015-08-18 DIAGNOSIS — M545 Low back pain: Secondary | ICD-10-CM | POA: Diagnosis not present

## 2015-08-18 DIAGNOSIS — Z923 Personal history of irradiation: Secondary | ICD-10-CM | POA: Diagnosis not present

## 2015-08-18 DIAGNOSIS — Z170 Estrogen receptor positive status [ER+]: Secondary | ICD-10-CM | POA: Diagnosis not present

## 2015-08-18 DIAGNOSIS — C7951 Secondary malignant neoplasm of bone: Secondary | ICD-10-CM | POA: Diagnosis not present

## 2015-08-18 DIAGNOSIS — C7931 Secondary malignant neoplasm of brain: Secondary | ICD-10-CM | POA: Diagnosis not present

## 2015-08-19 ENCOUNTER — Ambulatory Visit
Admission: RE | Admit: 2015-08-19 | Discharge: 2015-08-19 | Disposition: A | Discharge status: Home / Self Care | Payer: Medicare HMO | Source: Ambulatory Visit | Attending: Radiation Oncology | Admitting: Radiation Oncology

## 2015-08-19 ENCOUNTER — Ambulatory Visit
Admission: RE | Admit: 2015-08-19 | Discharge: 2015-08-19 | Disposition: A | Discharge status: Home / Self Care | Payer: Medicare HMO | Source: Ambulatory Visit | Attending: Hematology and Oncology | Admitting: Hematology and Oncology

## 2015-08-19 DIAGNOSIS — C50911 Malignant neoplasm of unspecified site of right female breast: Secondary | ICD-10-CM

## 2015-08-19 DIAGNOSIS — C7931 Secondary malignant neoplasm of brain: Secondary | ICD-10-CM | POA: Diagnosis not present

## 2015-08-19 DIAGNOSIS — C7951 Secondary malignant neoplasm of bone: Secondary | ICD-10-CM

## 2015-08-19 DIAGNOSIS — G893 Neoplasm related pain (acute) (chronic): Secondary | ICD-10-CM | POA: Insufficient documentation

## 2015-08-19 DIAGNOSIS — H01009 Unspecified blepharitis unspecified eye, unspecified eyelid: Secondary | ICD-10-CM | POA: Diagnosis not present

## 2015-08-19 DIAGNOSIS — C50919 Malignant neoplasm of unspecified site of unspecified female breast: Secondary | ICD-10-CM | POA: Insufficient documentation

## 2015-08-19 LAB — GLUCOSE, CAPILLARY: Glucose-Capillary: 72 mg/dL (ref 65–99)

## 2015-08-20 ENCOUNTER — Telehealth: Payer: Self-pay | Admitting: *Deleted

## 2015-08-20 ENCOUNTER — Encounter
Admission: RE | Admit: 2015-08-20 | Discharge: 2015-08-20 | Disposition: A | Discharge status: Home / Self Care | Payer: Medicare HMO | Source: Ambulatory Visit | Attending: Hematology and Oncology | Admitting: Hematology and Oncology

## 2015-08-20 DIAGNOSIS — M545 Low back pain: Secondary | ICD-10-CM | POA: Diagnosis not present

## 2015-08-20 DIAGNOSIS — Z79810 Long term (current) use of selective estrogen receptor modulators (SERMs): Secondary | ICD-10-CM | POA: Diagnosis not present

## 2015-08-20 DIAGNOSIS — Z8669 Personal history of other diseases of the nervous system and sense organs: Secondary | ICD-10-CM | POA: Diagnosis not present

## 2015-08-20 DIAGNOSIS — C7951 Secondary malignant neoplasm of bone: Secondary | ICD-10-CM | POA: Insufficient documentation

## 2015-08-20 DIAGNOSIS — C7931 Secondary malignant neoplasm of brain: Secondary | ICD-10-CM | POA: Diagnosis not present

## 2015-08-20 DIAGNOSIS — M25512 Pain in left shoulder: Secondary | ICD-10-CM | POA: Diagnosis not present

## 2015-08-20 DIAGNOSIS — Z510 Encounter for antineoplastic radiation therapy: Secondary | ICD-10-CM | POA: Diagnosis not present

## 2015-08-20 DIAGNOSIS — C50919 Malignant neoplasm of unspecified site of unspecified female breast: Secondary | ICD-10-CM | POA: Diagnosis not present

## 2015-08-20 DIAGNOSIS — C50911 Malignant neoplasm of unspecified site of right female breast: Secondary | ICD-10-CM | POA: Insufficient documentation

## 2015-08-20 DIAGNOSIS — Z170 Estrogen receptor positive status [ER+]: Secondary | ICD-10-CM | POA: Diagnosis not present

## 2015-08-20 DIAGNOSIS — Z923 Personal history of irradiation: Secondary | ICD-10-CM | POA: Diagnosis not present

## 2015-08-20 LAB — GLUCOSE, CAPILLARY: Glucose-Capillary: 107 mg/dL — ABNORMAL HIGH (ref 65–99)

## 2015-08-20 MED ORDER — FLUDEOXYGLUCOSE F - 18 (FDG) INJECTION
13.0100 | Freq: Once | INTRAVENOUS | Status: AC | PRN
  Administered 2015-08-20: 13.01 via INTRAVENOUS

## 2015-08-20 NOTE — Telephone Encounter (Signed)
Megan with [employer] called to notify that PA is required for Ibrance. PA will be initiated by [employer]. Please call with any further questions.

## 2015-08-22 ENCOUNTER — Other Ambulatory Visit: Payer: Self-pay | Admitting: Hematology and Oncology

## 2015-08-22 DIAGNOSIS — C7951 Secondary malignant neoplasm of bone: Secondary | ICD-10-CM

## 2015-08-22 DIAGNOSIS — C50919 Malignant neoplasm of unspecified site of unspecified female breast: Secondary | ICD-10-CM

## 2015-08-23 ENCOUNTER — Inpatient Hospital Stay: Payer: Medicare HMO

## 2015-08-23 ENCOUNTER — Ambulatory Visit: Payer: Medicare HMO

## 2015-08-23 ENCOUNTER — Inpatient Hospital Stay: Payer: Medicare HMO | Attending: Hematology and Oncology | Admitting: Hematology and Oncology

## 2015-08-23 ENCOUNTER — Other Ambulatory Visit: Payer: Self-pay | Admitting: Hematology and Oncology

## 2015-08-23 VITALS — BP 136/86 | HR 79 | Temp 97.8°F | Resp 18 | Wt 151.9 lb

## 2015-08-23 DIAGNOSIS — Z170 Estrogen receptor positive status [ER+]: Secondary | ICD-10-CM | POA: Insufficient documentation

## 2015-08-23 DIAGNOSIS — C50912 Malignant neoplasm of unspecified site of left female breast: Secondary | ICD-10-CM | POA: Insufficient documentation

## 2015-08-23 DIAGNOSIS — N200 Calculus of kidney: Secondary | ICD-10-CM | POA: Diagnosis not present

## 2015-08-23 DIAGNOSIS — M898X9 Other specified disorders of bone, unspecified site: Secondary | ICD-10-CM

## 2015-08-23 DIAGNOSIS — Z79899 Other long term (current) drug therapy: Secondary | ICD-10-CM | POA: Diagnosis not present

## 2015-08-23 DIAGNOSIS — C50911 Malignant neoplasm of unspecified site of right female breast: Secondary | ICD-10-CM

## 2015-08-23 DIAGNOSIS — Z79818 Long term (current) use of other agents affecting estrogen receptors and estrogen levels: Secondary | ICD-10-CM

## 2015-08-23 DIAGNOSIS — C7951 Secondary malignant neoplasm of bone: Secondary | ICD-10-CM

## 2015-08-23 DIAGNOSIS — G893 Neoplasm related pain (acute) (chronic): Secondary | ICD-10-CM

## 2015-08-23 DIAGNOSIS — B964 Proteus (mirabilis) (morganii) as the cause of diseases classified elsewhere: Secondary | ICD-10-CM | POA: Diagnosis not present

## 2015-08-23 DIAGNOSIS — K219 Gastro-esophageal reflux disease without esophagitis: Secondary | ICD-10-CM

## 2015-08-23 DIAGNOSIS — IMO0001 Reserved for inherently not codable concepts without codable children: Secondary | ICD-10-CM

## 2015-08-23 DIAGNOSIS — Z9013 Acquired absence of bilateral breasts and nipples: Secondary | ICD-10-CM | POA: Diagnosis not present

## 2015-08-23 DIAGNOSIS — C7931 Secondary malignant neoplasm of brain: Secondary | ICD-10-CM

## 2015-08-23 LAB — CBC WITH DIFFERENTIAL/PLATELET
Basophils Absolute: 0 10*3/uL (ref 0–0.1)
Basophils Relative: 0 %
Eosinophils Absolute: 0.1 10*3/uL (ref 0–0.7)
Eosinophils Relative: 0 %
HCT: 37.6 % (ref 35.0–47.0)
Hemoglobin: 11.9 g/dL — ABNORMAL LOW (ref 12.0–16.0)
Lymphocytes Relative: 6 %
Lymphs Abs: 1.2 10*3/uL (ref 1.0–3.6)
MCH: 27.1 pg (ref 26.0–34.0)
MCHC: 31.7 g/dL — ABNORMAL LOW (ref 32.0–36.0)
MCV: 85.3 fL (ref 80.0–100.0)
Monocytes Absolute: 0.2 10*3/uL (ref 0.2–0.9)
Monocytes Relative: 1 %
Neutro Abs: 18.2 10*3/uL — ABNORMAL HIGH (ref 1.4–6.5)
Neutrophils Relative %: 93 %
Platelets: 349 10*3/uL (ref 150–440)
RBC: 4.41 MIL/uL (ref 3.80–5.20)
RDW: 14.7 % — ABNORMAL HIGH (ref 11.5–14.5)
WBC: 19.7 10*3/uL — ABNORMAL HIGH (ref 3.6–11.0)

## 2015-08-23 LAB — COMPREHENSIVE METABOLIC PANEL
ALT: 15 U/L (ref 14–54)
AST: 23 U/L (ref 15–41)
Albumin: 4 g/dL (ref 3.5–5.0)
Alkaline Phosphatase: 104 U/L (ref 38–126)
Anion gap: 7 (ref 5–15)
BUN: 13 mg/dL (ref 6–20)
CO2: 24 mmol/L (ref 22–32)
Calcium: 8.8 mg/dL — ABNORMAL LOW (ref 8.9–10.3)
Chloride: 102 mmol/L (ref 101–111)
Creatinine, Ser: 0.51 mg/dL (ref 0.44–1.00)
GFR calc Af Amer: >60 mL/min (ref >60)
GFR calc non Af Amer: >60 mL/min (ref >60)
Glucose, Bld: 121 mg/dL — ABNORMAL HIGH (ref 65–99)
Potassium: 3.8 mmol/L (ref 3.5–5.1)
Sodium: 133 mmol/L — ABNORMAL LOW (ref 135–145)
Total Bilirubin: 0.8 mg/dL (ref 0.3–1.2)
Total Protein: 7.8 g/dL (ref 6.5–8.1)

## 2015-08-23 MED ORDER — DEXAMETHASONE 2 MG PO TABS: 2.0000 mg | ORAL_TABLET | Freq: Every day | ORAL | Status: DC

## 2015-08-23 MED ORDER — PANTOPRAZOLE SODIUM 20 MG PO TBEC: 20.0000 mg | DELAYED_RELEASE_TABLET | Freq: Every day | ORAL | Status: DC

## 2015-08-23 MED ORDER — FULVESTRANT 250 MG/5ML IM SOLN
500.0000 mg | INTRAMUSCULAR | Status: DC
  Administered 2015-08-23: 500 mg via INTRAMUSCULAR
  Filled 2015-08-23: qty 10

## 2015-08-23 MED ORDER — DENOSUMAB 120 MG/1.7ML ~~LOC~~ SOLN
120.0000 mg | Freq: Once | SUBCUTANEOUS | Status: AC
  Administered 2015-08-23: 120 mg via SUBCUTANEOUS
  Filled 2015-08-23: qty 1.7

## 2015-08-23 MED ORDER — OMEPRAZOLE 40 MG PO CPDR: 40.0000 mg | DELAYED_RELEASE_CAPSULE | ORAL | Status: DC | PRN

## 2015-08-23 NOTE — Progress Notes (Signed)
[facility]-  Cancer Center  Clinic day:  08/23/2015   Chief Complaint: Melinda Tanner is a 44 y.o. 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   HPI:  The patient was last seen in the medical oncology clinic on 08/13/2015.  At that time, she had increased lower back pain.  Lumbar spine MRI revealed osseous metastatic disease at L3, L4, L5, S1, and in the left iliac bone posteriorly. There was no definite epidural metastatic disease.  CA27.29 had increased to 333.8 on 07/26/2015.  We discussed a switch in therapy from tamoxifen to Faslodex plus Ibrance.    During the interim, she completed cranial radiation.  She comments that she is losing her hair.  She is on Decadron 4 mg a day.  PET scan on 08/20/2015 revealed a mixed response.  There was interval response to therapy in the lower cervical and thoracic adenopathy.  There was a mixed response to multifocal osseous metastases.  There was improvement in the left humeral head (SUV 12.2 to 5.5) and left sacral lesion (SUV 12.1 to 7.6).  There was a left iliac lesion (SUV 7.8) which was new or increased from the prior exam (SUV 3.4).  There was a new faint left acetabular lesion (SUV 4.7).  Symptomatically, she has had very little bone pain.  She states that radiation is to begin tomorrow to her left humerus and back (10 fractions).   Past Medical History  Diagnosis Date  . Breast cancer (HCC) 2009  . Seizures (HCC)   . Brain cancer (HCC) 2012    Met. from Breast  . Complication of anesthesia     nausea, "drops in potassium and magnesium"    Past Surgical History  Procedure Laterality Date  . Mastectomy Bilateral 2010  . Brain surgery  2012, 2106  . Cesarean section    . Laparoscopic bilateral salpingo oopherectomy Bilateral 05/18/2015    Procedure: LAPAROSCOPIC BILATERAL SALPINGO OOPHORECTOMY;   Surgeon: Stephen D Jackson, MD;  Location: [facility] ORS;  Service: Gynecology;  Laterality: Bilateral;    Family History  Problem Relation Age of Onset  . Cancer Paternal Aunt   . Cancer Paternal Uncle   . Cancer Paternal Grandfather   . Hypertension Brother   . Diabetes Paternal Grandmother   A paternal aunt had breast cancer age 57, a paternal uncle had prostate cancer, and a paternal grandfather had leukemia.  Social History:  reports that she has never smoked. She does not have any smokeless tobacco history on file. She reports that she does not drink alcohol or use illicit drugs.  She is from [place].  She moved to [place] in 2015.  She moved to  in 04/2014.  She recently moved into the [place] area.  She has 2 children (boy and girl). The patient is accompanied by her husband and the Spanish interpreter today.  Allergies:  Allergies  Allergen Reactions  . Taxol [Paclitaxel] Anaphylaxis  . Aspirin Swelling  . Penicillins Swelling  . Zofran [Ondansetron Hcl] Nausea And Vomiting    Current Medications: Current Outpatient Prescriptions  Medication Sig Dispense Refill  . dexamethasone (DECADRON) 4 MG tablet Take 1 tablet (4 mg total) by mouth daily. 20 tablet 0  . omeprazole (PRILOSEC) 40 MG capsule Take 1 capsule by mouth as needed. Reported on 08/13/2015    . oxycodone (OXY-IR) 5 MG capsule Take 1   capsule (5 mg total) by mouth every 4 (four) hours as needed. 30 capsule 0  . palbociclib (IBRANCE) 125 MG capsule Take 1 capsule daily for 21 days and then take 1 week off and repeat cycle again (Take whole with food.) 21 capsule 3  . pantoprazole (PROTONIX) 20 MG tablet Take 1 tablet (20 mg total) by mouth daily. (Patient taking differently: Take 20 mg by mouth daily as needed. ) 30 tablet 0  . tamoxifen (NOLVADEX) 20 MG tablet Take 1 tablet (20 mg total) by mouth daily. 90 tablet 1   No current facility-administered medications for this visit.    Review of Systems:   GENERAL:  Feels "ok".  No fevers or sweats.  Weight stable. PERFORMANCE STATUS (ECOG):  1 HEENT:  No visual changes, runny nose, sore throat, mouth sores or tenderness. Lungs: No shortness of breath or cough.  No hemoptysis. Cardiac:  No chest pain, palpitations, orthopnea, or PND. GI:  Appetite normal.  No nausea, vomiting, diarrhea, constipation, melena or hematochezia. GU:  No urgency, frequency, dysuria or hematuria. Musculoskeletal:  Pain in left upper arm, improved.  Back pain, stable.  No muscle tenderness. Extremities:  No pain or swelling. Skin:  No rashes, skin changes or ulcers.  Neuro:  No headache, numbness or weakness, balance or coordination issues.  Endocrine:  No diabetes, thyroid issues, hot flashes or night sweats. Psych:  Insomnia secondary to Decadron.  No mood changes, depression or anxiety. Pain:  No focal pain. Review of systems:  All other systems reviewed and found to be negative.  Physical Exam: Blood pressure 136/86, pulse 79, temperature 97.8 F (36.6 C), temperature source Tympanic, resp. rate 18, weight 151 lb 14.4 oz (68.9 kg), last menstrual period 03/19/2015. GENERAL:  Well developed, well nourished, sitting comfortably in the exam room in no acute distress.  She is tearful at times. MENTAL STATUS:  Alert and oriented to person, place and time. HEAD: Long black hair.  Loose stands on clothes.  Normocephalic, atraumatic, face symmetric, no Cushingoid features. EYES:  Brown eyes. Pupils equal round and reactive to light and accomodation.  No conjunctivitis or scleral icterus. ENT:  Oropharynx clear without lesion.  Tongue normal. Mucous membranes moist.  RESPIRATORY:  Clear to auscultation without rales, wheezes or rhonchi. CARDIOVASCULAR:  Regular rate and rhythm without murmur, rub or gallop. ABDOMEN:  Soft, non-tender, with active bowel sounds, and no hepatosplenomegaly.  No masses. SKIN:  No rashes, ulcers or lesions. EXTREMITIES: No edema, no skin  discoloration or tenderness.  No palpable cords. LYMPH NODES: 1 cm Tanner right cervical node (stable).  No palpable supraclavicular, axillary or inguinal adenopathy  NEUROLOGICAL:  Appropriate. PSYCH:  Appropriate.     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 No lymph invasion. Tumor cellularity 20%. [id number]. 07/13/2008: Left mastectomy: Residual ductal carcinoma in situ, high nuclear grade, deep margin negative ypTisNO(sn)MX. 10/25/2010: Abdominal and pelvic ultrasound: questionable lesion near gallbladder fossa recommend MRI follow up. 03/02/2011: Left and right frontal excisional brain biopsy: Adenocarcinoma, metastatic, NOS: ER +, PR 5% +, Her 2 NEG. 03/04/2011: [facility] testing of IHC4 residual recurrence risk score of 114 showing an 8 year recurrence reate of 57%. ER POSTIVE, PR POSITIVE, HER 2   POSTIVE (previously documented negative in 2009) cutoff of 361 patient scores 426. ki67 71%.  08/05/2013: Right breast FNA supraclavicular region: positive for metastatic carcinoma. ER/PR/HER2 not reported. 08/19/2014: Right cervical lymph node. Adenocarcinoma with features consistent with metastatic breast carcinoma. ER/PR/HER2 insufficient tissue. 09/04/2014: Repeat craniotomy, resection of right frontal tumor (metastatic breast cancer). ER 90-100% PR 51-60% HER2 - 09/29/2014: Right cervical lymph node, metastatic carcinoma c/w breast primary, ER 100% PR 20% HER2-  Imaging: 08/15/2007: Bilateral Mammogram: Dense breasts with solid lesion at 6:00, 7:00, and 9:00 of right breast, solid lesion at 2:00 position left breast BIRADS  4B. 09/10/2007: Breast MRI: Three solid irregular enhancing lesions of the right breast 2.1 x 3.2, 2.8 x 2.7, and 1.8 x 1.6 cm. Mildly enlarged enhancing nodule right axilla. Left breast clumped enhancement midportion suspicious for malignancy. 03/01/2011: Brain MRI: At least two juxtacortical, intra-axial masses with extensive surrounding vasogenic edema most consistent with intracranial metastasis. 07/22/2013: Brain MRI +/- contrast: interval increase in enhancing component on T2 FLAIR now measuring 1.0cm x 1.1 cm worrisome for progression of disease. 01/21/2014:  PET scan: Hypermetabolic small right supraclavicular and right upper paratracheal lymph nodes suspicious for mets. Hypermetabolic lymph node in the right paratracheal upper mediastinum that measure approximately 0.9 x 0.5cm. (of note no impressions made of areas noted on brain MRI 07/22/13).  01/21/2014: MRI Brain: Right frontal enhancing lesion has shown interval increase in size with surrounding edema and local mass effect (measured 1.8x2.1x1.6, previously 1.2x1.1x1.0) 07/16/2014: Brain MRI:  Right anterior frontal enhancing mass 2.5 x 2.0 cm compatible with a metastatic lesion has increased and changed in configuration from previous 2.0 x 1.8 cm, formerly multilobular. 08/06/2014: PET scan:  Multiple bilateral FDG avid Tanner cervical, supraclavicular, upper mediastinal, and right internal mammary lymphadenopathy, likely representing metastatic disease. A cervical node in the right lower neck should be amenable to percutaneous sampling.  12/2014: Head MRI:  Evolution of right frontal postoperative changes status post tumor resection at the vertex. Expected postoperative changes. Minimal marginal enhancement resection bed with some adjacent posterior intrinsic T1 signal again seen. Decreasing T2/FLAIR adjacent parenchymal changes.  Stable resection cavity is left posterior frontal and right parieto-occipital regions.  Labs:  Appointment on  08/23/2015  Component Date Value Ref Range Status  . WBC 08/23/2015 19.7* 3.6 - 11.0 K/uL Final  . RBC 08/23/2015 4.41  3.80 - 5.20 MIL/uL Final  . Hemoglobin 08/23/2015 11.9* 12.0 - 16.0 g/dL Final  . HCT 08/23/2015 37.6  35.0 - 47.0 % Final  . MCV 08/23/2015 85.3  80.0 - 100.0 fL Final  . MCH 08/23/2015 27.1  26.0 - 34.0 pg Final  . MCHC 08/23/2015 31.7* 32.0 - 36.0 g/dL Final  . RDW 08/23/2015 14.7* 11.5 - 14.5 % Final  . Platelets 08/23/2015 349  150 - 440 K/uL Final  . Neutrophils Relative % 08/23/2015 93   Final  . Neutro Abs 08/23/2015 18.2* 1.4 - 6.5 K/uL Final  . Lymphocytes Relative 08/23/2015 6   Final  . Lymphs Abs 08/23/2015 1.2  1.0 - 3.6 K/uL Final  . Monocytes Relative 08/23/2015 1   Final  . Monocytes Absolute 08/23/2015 0.2  0.2 - 0.9 K/uL Final  . Eosinophils Relative 08/23/2015 0   Final  . Eosinophils Absolute 08/23/2015 0.1  0 - 0.7 K/uL Final  . Basophils Relative 08/23/2015 0   Final  . Basophils Absolute 08/23/2015 0.0  0 - 0.1 K/uL Final    Assessment:  Yanni Pigford is a 44 y.o.   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  She initially presented in 2009 while living in [place] with multi-focal right breast cancer with positive lymph node(s) which was ER+, PR+(Tanner), and HER2/neu+. She received neoadjuvant chemotherapy (AC x 4 every 3 weeks followed by Taxol/Abraxane + carboplatin weekly x 12) without anti-HER2 treatment.    On 07/14/2008, she underwent bilateral mastectomies followed by reconstruction.  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   Zero of 11 lymph nodes on the right were positive for malignancy. 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 Deep margin was negative. Zero of 2 lymph nodes were positive for metastatic disease.  She received adjuvant radiation to the right breast.  She received adjuvant tamoxifen and Zoladex.  She could not tolerate symptoms of joint pain and  tamoxifen was discontinued after 1-2 months.  Zoladex was continued for approximately 1.5 years.  She was diagnosed with brain metastases in 02/2011.  Pathology revealed ER+, PR+(Tanner), and HER2/neu-.  She underwent resection followed by [facility].  On 03/09/2011, original breast tissue (09/03/2007) sent to [facility] testing.  Testing revealed ER+, PR+(borderline), and HER2/neu positive (different than initial testing).  She received Herceptin for 1 year beginning in 2013.  She then began Tykerb and Xeloda after completion of Herceptin (2014).  She was on extremely Tanner, and probably sub-therapeutic, dosing of lapatinib + capecitabine.   She developed right supraclavicular adenopathy in 2015. She was noted to have slow disease progression in the right frontal/parietal lesion with minimal presence of systemic disease on PET scans. Capecitabine + lapatinib were discontinued in 04/2014.   She underwent repeat craniotomy with resection of brain metastasis on 09/04/2014.  Biopsy of the right supraclavicular lymph node  was ER+,PR+,HER2/neu-.  She started tamoxifen in 09/2014, but discontinued it secondary to pain in hip, back, and shoulder.  PET scan on 04/20/2015 revealed cervical and thoracic nodal metastasis.  There was multifocal osseous metastasis (left transvers T4 process, left humeral head, right iliac wing, and left side of the sacrum).  Incidental findings included right nephrolithiasis.    CA27.29 was 177.8 on 04/16/2015, 276.4 on 05/28/2015, 287.5 on 07/13/2015, 333.8 on 07/26/2015, and 412.9 on 08/23/2015.  Bone scan on 04/22/2015 corresponded with PET-CT with metastatic lesions evident in the left humeral head, left posterior aspect of T4, left aspect of S1, and the right iliac crest.  There was no other definite foci of metastatic disease to bone.  Uptake in the skull was most suggestive of the previous right frontal craniotomy.  Bone density study on 05/24/2015 revealed a T-score  of -0.8 in the right femoral neck (normal).  She restarted tamoxifen on 04/23/2015.  She received Zoladex on 04/30/2015.  She underwent laparoscopic bilateral oophorectomy on 05/18/2015.  She receives monthly Xgeva (began 04/30/2015; last 07/26/2015).  She was diagnosed with a Proteus mirabilis UTI on 07/13/2015 and was treated with a course of ciprofloxacin.  Head MRI on 07/07/2015 revealed metastatic breast cancer with 3 resection cavities. The anterior right frontal cavity was positive for nodular marginal enhancement, new from brain MRI report 01/07/2015, and consistent with recurrent disease.  She completed cranial radiation.  She is on Decadron 4 mg a day.  Lumbar spine MRI on 08/12/2015 revealed osseous metastatic disease at L3, L4, L5, S1, and in the left iliac bone posteriorly. There was no definite epidural metastatic disease.  PET scan on 08/20/2015 revealed a response to therapy in the lower cervical and thoracic  adenopathy.  There was a mixed response to multifocal osseous metastases (some new lesions).   Symptomatically, she has had very little pain.  Exam is stable.  Plan: 1.  Review PET scan and mixed response.  Discuss discontinuation of tamoxifen.  Discuss initiation of Faslodex.  Side effects reviewed.  Patient consented.  Plan to hold initiation of Ibrance until recovery from radiation. 2.  Labs today:  CBC with diff, CMP, CA27.29. 3.  Discontinue tamoxifen. 4.  Faslodex today. 5.  Xgeva today. 6.  Anticipate initiation of radiation tomorrow.. 7.  Taper Decadron:  2 mg a day x 7 days then 2 mg QOD x 1 week then off.  Discussed with Dr. Olena Leatherwood staff. 8.  Rx:  Decadron and Omeprazole. 9.  RTC in 2 weeks for MD assess, labs (CBC with diff, CMP, CA27.29), and completion of Faslodex load.  Addendum:  Scans personally reviewed with radiology.  Multiple issues addressed today.  Total time spent was greater than 45 minutes.   Lequita Asal, MD  08/23/2015, 2:09 PM

## 2015-08-23 NOTE — Progress Notes (Signed)
Continues to feel "hyper" while taking decadron. Unable to sleep well at night despite taking decadron in the morning. Needs refills of prilosec and decadron. Questions if decadron dose may be reduced since is starting radiation tomorrow (08/24/15).

## 2015-08-24 ENCOUNTER — Ambulatory Visit
Admission: RE | Admit: 2015-08-24 | Discharge: 2015-08-24 | Disposition: A | Discharge status: Home / Self Care | Payer: Medicare HMO | Source: Ambulatory Visit | Attending: Radiation Oncology | Admitting: Radiation Oncology

## 2015-08-24 ENCOUNTER — Ambulatory Visit: Payer: Medicare HMO

## 2015-08-24 DIAGNOSIS — Z510 Encounter for antineoplastic radiation therapy: Secondary | ICD-10-CM | POA: Diagnosis not present

## 2015-08-24 DIAGNOSIS — M545 Low back pain: Secondary | ICD-10-CM | POA: Diagnosis not present

## 2015-08-24 DIAGNOSIS — Z8669 Personal history of other diseases of the nervous system and sense organs: Secondary | ICD-10-CM | POA: Diagnosis not present

## 2015-08-24 DIAGNOSIS — Z923 Personal history of irradiation: Secondary | ICD-10-CM | POA: Diagnosis not present

## 2015-08-24 DIAGNOSIS — C50919 Malignant neoplasm of unspecified site of unspecified female breast: Secondary | ICD-10-CM | POA: Diagnosis not present

## 2015-08-24 DIAGNOSIS — C7951 Secondary malignant neoplasm of bone: Secondary | ICD-10-CM | POA: Diagnosis not present

## 2015-08-24 DIAGNOSIS — Z79810 Long term (current) use of selective estrogen receptor modulators (SERMs): Secondary | ICD-10-CM | POA: Diagnosis not present

## 2015-08-24 DIAGNOSIS — M25512 Pain in left shoulder: Secondary | ICD-10-CM | POA: Diagnosis not present

## 2015-08-24 DIAGNOSIS — C7931 Secondary malignant neoplasm of brain: Secondary | ICD-10-CM | POA: Diagnosis not present

## 2015-08-24 DIAGNOSIS — Z170 Estrogen receptor positive status [ER+]: Secondary | ICD-10-CM | POA: Diagnosis not present

## 2015-08-24 LAB — CANCER ANTIGEN 27.29: CA 27.29: 412.9 U/mL — ABNORMAL HIGH (ref 0.0–38.6)

## 2015-08-25 ENCOUNTER — Ambulatory Visit
Admission: RE | Admit: 2015-08-25 | Discharge: 2015-08-25 | Disposition: A | Discharge status: Home / Self Care | Payer: Medicare HMO | Source: Ambulatory Visit | Attending: Radiation Oncology | Admitting: Radiation Oncology

## 2015-08-25 ENCOUNTER — Encounter: Payer: Self-pay | Admitting: Hematology and Oncology

## 2015-08-25 DIAGNOSIS — C50919 Malignant neoplasm of unspecified site of unspecified female breast: Secondary | ICD-10-CM | POA: Diagnosis not present

## 2015-08-25 DIAGNOSIS — C7931 Secondary malignant neoplasm of brain: Secondary | ICD-10-CM | POA: Diagnosis not present

## 2015-08-25 DIAGNOSIS — M25512 Pain in left shoulder: Secondary | ICD-10-CM | POA: Diagnosis not present

## 2015-08-25 DIAGNOSIS — C7951 Secondary malignant neoplasm of bone: Secondary | ICD-10-CM | POA: Diagnosis not present

## 2015-08-25 DIAGNOSIS — Z510 Encounter for antineoplastic radiation therapy: Secondary | ICD-10-CM | POA: Diagnosis not present

## 2015-08-25 DIAGNOSIS — Z8669 Personal history of other diseases of the nervous system and sense organs: Secondary | ICD-10-CM | POA: Diagnosis not present

## 2015-08-25 DIAGNOSIS — Z923 Personal history of irradiation: Secondary | ICD-10-CM | POA: Diagnosis not present

## 2015-08-25 DIAGNOSIS — Z79810 Long term (current) use of selective estrogen receptor modulators (SERMs): Secondary | ICD-10-CM | POA: Diagnosis not present

## 2015-08-25 DIAGNOSIS — M545 Low back pain: Secondary | ICD-10-CM | POA: Diagnosis not present

## 2015-08-25 DIAGNOSIS — Z170 Estrogen receptor positive status [ER+]: Secondary | ICD-10-CM | POA: Diagnosis not present

## 2015-08-26 ENCOUNTER — Ambulatory Visit
Admission: RE | Admit: 2015-08-26 | Discharge: 2015-08-26 | Disposition: A | Discharge status: Home / Self Care | Payer: Medicare HMO | Source: Ambulatory Visit | Attending: Radiation Oncology | Admitting: Radiation Oncology

## 2015-08-26 DIAGNOSIS — Z8669 Personal history of other diseases of the nervous system and sense organs: Secondary | ICD-10-CM | POA: Diagnosis not present

## 2015-08-26 DIAGNOSIS — Z79810 Long term (current) use of selective estrogen receptor modulators (SERMs): Secondary | ICD-10-CM | POA: Diagnosis not present

## 2015-08-26 DIAGNOSIS — Z923 Personal history of irradiation: Secondary | ICD-10-CM | POA: Diagnosis not present

## 2015-08-26 DIAGNOSIS — M25512 Pain in left shoulder: Secondary | ICD-10-CM | POA: Diagnosis not present

## 2015-08-26 DIAGNOSIS — C7951 Secondary malignant neoplasm of bone: Secondary | ICD-10-CM | POA: Diagnosis not present

## 2015-08-26 DIAGNOSIS — Z510 Encounter for antineoplastic radiation therapy: Secondary | ICD-10-CM | POA: Diagnosis not present

## 2015-08-26 DIAGNOSIS — M545 Low back pain: Secondary | ICD-10-CM | POA: Diagnosis not present

## 2015-08-26 DIAGNOSIS — C50919 Malignant neoplasm of unspecified site of unspecified female breast: Secondary | ICD-10-CM | POA: Diagnosis not present

## 2015-08-26 DIAGNOSIS — Z170 Estrogen receptor positive status [ER+]: Secondary | ICD-10-CM | POA: Diagnosis not present

## 2015-08-26 DIAGNOSIS — C7931 Secondary malignant neoplasm of brain: Secondary | ICD-10-CM | POA: Diagnosis not present

## 2015-08-27 ENCOUNTER — Ambulatory Visit: Payer: Medicare HMO

## 2015-08-27 ENCOUNTER — Ambulatory Visit
Admission: RE | Admit: 2015-08-27 | Discharge: 2015-08-27 | Disposition: A | Discharge status: Home / Self Care | Payer: Medicare HMO | Source: Ambulatory Visit | Attending: Radiation Oncology | Admitting: Radiation Oncology

## 2015-08-30 ENCOUNTER — Other Ambulatory Visit: Payer: Medicare HMO

## 2015-08-30 ENCOUNTER — Ambulatory Visit
Admission: RE | Admit: 2015-08-30 | Discharge: 2015-08-30 | Disposition: A | Discharge status: Home / Self Care | Payer: Medicare HMO | Source: Ambulatory Visit | Attending: Radiation Oncology | Admitting: Radiation Oncology

## 2015-08-30 ENCOUNTER — Ambulatory Visit: Payer: Medicare HMO

## 2015-08-30 ENCOUNTER — Ambulatory Visit: Payer: Medicare HMO | Admitting: Hematology and Oncology

## 2015-08-30 DIAGNOSIS — Z79810 Long term (current) use of selective estrogen receptor modulators (SERMs): Secondary | ICD-10-CM | POA: Diagnosis not present

## 2015-08-30 DIAGNOSIS — Z170 Estrogen receptor positive status [ER+]: Secondary | ICD-10-CM | POA: Diagnosis not present

## 2015-08-30 DIAGNOSIS — M545 Low back pain: Secondary | ICD-10-CM | POA: Diagnosis not present

## 2015-08-30 DIAGNOSIS — C7931 Secondary malignant neoplasm of brain: Secondary | ICD-10-CM | POA: Diagnosis not present

## 2015-08-30 DIAGNOSIS — Z923 Personal history of irradiation: Secondary | ICD-10-CM | POA: Diagnosis not present

## 2015-08-30 DIAGNOSIS — Z510 Encounter for antineoplastic radiation therapy: Secondary | ICD-10-CM | POA: Diagnosis not present

## 2015-08-30 DIAGNOSIS — C50919 Malignant neoplasm of unspecified site of unspecified female breast: Secondary | ICD-10-CM | POA: Diagnosis not present

## 2015-08-30 DIAGNOSIS — M25512 Pain in left shoulder: Secondary | ICD-10-CM | POA: Diagnosis not present

## 2015-08-30 DIAGNOSIS — C7951 Secondary malignant neoplasm of bone: Secondary | ICD-10-CM | POA: Diagnosis not present

## 2015-08-30 DIAGNOSIS — Z8669 Personal history of other diseases of the nervous system and sense organs: Secondary | ICD-10-CM | POA: Diagnosis not present

## 2015-08-31 ENCOUNTER — Ambulatory Visit
Admission: RE | Admit: 2015-08-31 | Discharge: 2015-08-31 | Disposition: A | Discharge status: Home / Self Care | Payer: Medicare HMO | Source: Ambulatory Visit | Attending: Radiation Oncology | Admitting: Radiation Oncology

## 2015-08-31 DIAGNOSIS — C50919 Malignant neoplasm of unspecified site of unspecified female breast: Secondary | ICD-10-CM | POA: Diagnosis not present

## 2015-08-31 DIAGNOSIS — Z170 Estrogen receptor positive status [ER+]: Secondary | ICD-10-CM | POA: Diagnosis not present

## 2015-08-31 DIAGNOSIS — M545 Low back pain: Secondary | ICD-10-CM | POA: Diagnosis not present

## 2015-08-31 DIAGNOSIS — C7931 Secondary malignant neoplasm of brain: Secondary | ICD-10-CM | POA: Diagnosis not present

## 2015-08-31 DIAGNOSIS — Z8669 Personal history of other diseases of the nervous system and sense organs: Secondary | ICD-10-CM | POA: Diagnosis not present

## 2015-08-31 DIAGNOSIS — Z510 Encounter for antineoplastic radiation therapy: Secondary | ICD-10-CM | POA: Diagnosis not present

## 2015-08-31 DIAGNOSIS — C7951 Secondary malignant neoplasm of bone: Secondary | ICD-10-CM | POA: Diagnosis not present

## 2015-08-31 DIAGNOSIS — Z79810 Long term (current) use of selective estrogen receptor modulators (SERMs): Secondary | ICD-10-CM | POA: Diagnosis not present

## 2015-08-31 DIAGNOSIS — Z923 Personal history of irradiation: Secondary | ICD-10-CM | POA: Diagnosis not present

## 2015-08-31 DIAGNOSIS — M25512 Pain in left shoulder: Secondary | ICD-10-CM | POA: Diagnosis not present

## 2015-09-01 ENCOUNTER — Telehealth: Payer: Self-pay | Admitting: *Deleted

## 2015-09-01 ENCOUNTER — Ambulatory Visit
Admission: RE | Admit: 2015-09-01 | Discharge: 2015-09-01 | Disposition: A | Discharge status: Home / Self Care | Payer: Medicare HMO | Source: Ambulatory Visit | Attending: Radiation Oncology | Admitting: Radiation Oncology

## 2015-09-01 DIAGNOSIS — Z8669 Personal history of other diseases of the nervous system and sense organs: Secondary | ICD-10-CM | POA: Diagnosis not present

## 2015-09-01 DIAGNOSIS — Z170 Estrogen receptor positive status [ER+]: Secondary | ICD-10-CM | POA: Diagnosis not present

## 2015-09-01 DIAGNOSIS — Z79810 Long term (current) use of selective estrogen receptor modulators (SERMs): Secondary | ICD-10-CM | POA: Diagnosis not present

## 2015-09-01 DIAGNOSIS — C7951 Secondary malignant neoplasm of bone: Secondary | ICD-10-CM | POA: Diagnosis not present

## 2015-09-01 DIAGNOSIS — C7931 Secondary malignant neoplasm of brain: Secondary | ICD-10-CM | POA: Diagnosis not present

## 2015-09-01 DIAGNOSIS — C50919 Malignant neoplasm of unspecified site of unspecified female breast: Secondary | ICD-10-CM | POA: Diagnosis not present

## 2015-09-01 DIAGNOSIS — M25512 Pain in left shoulder: Secondary | ICD-10-CM | POA: Diagnosis not present

## 2015-09-01 DIAGNOSIS — Z510 Encounter for antineoplastic radiation therapy: Secondary | ICD-10-CM | POA: Diagnosis not present

## 2015-09-01 DIAGNOSIS — Z923 Personal history of irradiation: Secondary | ICD-10-CM | POA: Diagnosis not present

## 2015-09-01 DIAGNOSIS — M545 Low back pain: Secondary | ICD-10-CM | POA: Diagnosis not present

## 2015-09-01 NOTE — Telephone Encounter (Signed)
Called [facility] and gave them the husbands' # and then he read in the notes that they were able to get intouch with him and they will ship drug.  Pt has no copay for med.

## 2015-09-01 NOTE — Telephone Encounter (Signed)
Pt is not returning phone calls to set up delivery of Ibrance. [facility] needs to verify contact info and if patient needs interpreter so they can reach out to patient to set up delivery of Ibrance.

## 2015-09-02 ENCOUNTER — Ambulatory Visit
Admission: RE | Admit: 2015-09-02 | Discharge: 2015-09-02 | Disposition: A | Discharge status: Home / Self Care | Payer: Medicare HMO | Source: Ambulatory Visit | Attending: Radiation Oncology | Admitting: Radiation Oncology

## 2015-09-02 DIAGNOSIS — Z510 Encounter for antineoplastic radiation therapy: Secondary | ICD-10-CM | POA: Diagnosis not present

## 2015-09-02 DIAGNOSIS — Z923 Personal history of irradiation: Secondary | ICD-10-CM | POA: Diagnosis not present

## 2015-09-02 DIAGNOSIS — M25512 Pain in left shoulder: Secondary | ICD-10-CM | POA: Diagnosis not present

## 2015-09-02 DIAGNOSIS — M545 Low back pain: Secondary | ICD-10-CM | POA: Diagnosis not present

## 2015-09-02 DIAGNOSIS — C7931 Secondary malignant neoplasm of brain: Secondary | ICD-10-CM | POA: Diagnosis not present

## 2015-09-02 DIAGNOSIS — C7951 Secondary malignant neoplasm of bone: Secondary | ICD-10-CM | POA: Diagnosis not present

## 2015-09-02 DIAGNOSIS — Z170 Estrogen receptor positive status [ER+]: Secondary | ICD-10-CM | POA: Diagnosis not present

## 2015-09-02 DIAGNOSIS — C50919 Malignant neoplasm of unspecified site of unspecified female breast: Secondary | ICD-10-CM | POA: Diagnosis not present

## 2015-09-02 DIAGNOSIS — Z8669 Personal history of other diseases of the nervous system and sense organs: Secondary | ICD-10-CM | POA: Diagnosis not present

## 2015-09-02 DIAGNOSIS — Z79810 Long term (current) use of selective estrogen receptor modulators (SERMs): Secondary | ICD-10-CM | POA: Diagnosis not present

## 2015-09-03 ENCOUNTER — Other Ambulatory Visit: Payer: Self-pay

## 2015-09-03 ENCOUNTER — Inpatient Hospital Stay: Payer: Medicare HMO

## 2015-09-03 ENCOUNTER — Ambulatory Visit
Admission: RE | Admit: 2015-09-03 | Discharge: 2015-09-03 | Disposition: A | Discharge status: Home / Self Care | Payer: Medicare HMO | Source: Ambulatory Visit | Attending: Radiation Oncology | Admitting: Radiation Oncology

## 2015-09-03 DIAGNOSIS — Z510 Encounter for antineoplastic radiation therapy: Secondary | ICD-10-CM | POA: Diagnosis not present

## 2015-09-03 DIAGNOSIS — Z170 Estrogen receptor positive status [ER+]: Secondary | ICD-10-CM | POA: Diagnosis not present

## 2015-09-03 DIAGNOSIS — C7931 Secondary malignant neoplasm of brain: Secondary | ICD-10-CM | POA: Diagnosis not present

## 2015-09-03 DIAGNOSIS — Z8669 Personal history of other diseases of the nervous system and sense organs: Secondary | ICD-10-CM | POA: Diagnosis not present

## 2015-09-03 DIAGNOSIS — M545 Low back pain: Secondary | ICD-10-CM | POA: Diagnosis not present

## 2015-09-03 DIAGNOSIS — Z923 Personal history of irradiation: Secondary | ICD-10-CM | POA: Diagnosis not present

## 2015-09-03 DIAGNOSIS — C50911 Malignant neoplasm of unspecified site of right female breast: Secondary | ICD-10-CM

## 2015-09-03 DIAGNOSIS — Z79810 Long term (current) use of selective estrogen receptor modulators (SERMs): Secondary | ICD-10-CM | POA: Diagnosis not present

## 2015-09-03 DIAGNOSIS — C50919 Malignant neoplasm of unspecified site of unspecified female breast: Secondary | ICD-10-CM | POA: Diagnosis not present

## 2015-09-03 DIAGNOSIS — Z95828 Presence of other vascular implants and grafts: Secondary | ICD-10-CM

## 2015-09-03 DIAGNOSIS — C7951 Secondary malignant neoplasm of bone: Secondary | ICD-10-CM | POA: Diagnosis not present

## 2015-09-03 DIAGNOSIS — M25512 Pain in left shoulder: Secondary | ICD-10-CM | POA: Diagnosis not present

## 2015-09-03 MED ORDER — HEPARIN SOD (PORK) LOCK FLUSH 100 UNIT/ML IV SOLN
500.0000 [IU] | Freq: Once | INTRAVENOUS | Status: AC
  Administered 2015-09-03: 500 [IU] via INTRAVENOUS

## 2015-09-03 MED ORDER — SODIUM CHLORIDE 0.9% FLUSH
10.0000 mL | INTRAVENOUS | Status: DC | PRN
  Administered 2015-09-03: 10 mL via INTRAVENOUS
  Filled 2015-09-03: qty 10

## 2015-09-06 ENCOUNTER — Other Ambulatory Visit: Payer: Self-pay | Admitting: Hematology and Oncology

## 2015-09-06 ENCOUNTER — Inpatient Hospital Stay: Payer: Medicare HMO

## 2015-09-06 ENCOUNTER — Ambulatory Visit
Admission: RE | Admit: 2015-09-06 | Discharge: 2015-09-06 | Disposition: A | Discharge status: Home / Self Care | Payer: Medicare HMO | Source: Ambulatory Visit | Attending: Radiation Oncology | Admitting: Radiation Oncology

## 2015-09-06 ENCOUNTER — Encounter: Payer: Self-pay | Admitting: Hematology and Oncology

## 2015-09-06 ENCOUNTER — Inpatient Hospital Stay (HOSPITAL_BASED_OUTPATIENT_CLINIC_OR_DEPARTMENT_OTHER): Payer: Medicare HMO | Admitting: Hematology and Oncology

## 2015-09-06 VITALS — BP 110/81 | HR 93 | Temp 97.3°F | Resp 18 | Ht 63.0 in | Wt 149.8 lb

## 2015-09-06 DIAGNOSIS — C7931 Secondary malignant neoplasm of brain: Secondary | ICD-10-CM

## 2015-09-06 DIAGNOSIS — Z79899 Other long term (current) drug therapy: Secondary | ICD-10-CM

## 2015-09-06 DIAGNOSIS — Z170 Estrogen receptor positive status [ER+]: Secondary | ICD-10-CM

## 2015-09-06 DIAGNOSIS — C7951 Secondary malignant neoplasm of bone: Secondary | ICD-10-CM | POA: Diagnosis not present

## 2015-09-06 DIAGNOSIS — Z8669 Personal history of other diseases of the nervous system and sense organs: Secondary | ICD-10-CM | POA: Diagnosis not present

## 2015-09-06 DIAGNOSIS — N200 Calculus of kidney: Secondary | ICD-10-CM

## 2015-09-06 DIAGNOSIS — G893 Neoplasm related pain (acute) (chronic): Secondary | ICD-10-CM

## 2015-09-06 DIAGNOSIS — Z923 Personal history of irradiation: Secondary | ICD-10-CM | POA: Diagnosis not present

## 2015-09-06 DIAGNOSIS — IMO0001 Reserved for inherently not codable concepts without codable children: Secondary | ICD-10-CM

## 2015-09-06 DIAGNOSIS — Z510 Encounter for antineoplastic radiation therapy: Secondary | ICD-10-CM | POA: Diagnosis not present

## 2015-09-06 DIAGNOSIS — Z9013 Acquired absence of bilateral breasts and nipples: Secondary | ICD-10-CM

## 2015-09-06 DIAGNOSIS — M898X9 Other specified disorders of bone, unspecified site: Secondary | ICD-10-CM

## 2015-09-06 DIAGNOSIS — C50911 Malignant neoplasm of unspecified site of right female breast: Secondary | ICD-10-CM | POA: Diagnosis not present

## 2015-09-06 DIAGNOSIS — M545 Low back pain: Secondary | ICD-10-CM | POA: Diagnosis not present

## 2015-09-06 DIAGNOSIS — R197 Diarrhea, unspecified: Secondary | ICD-10-CM

## 2015-09-06 DIAGNOSIS — M25512 Pain in left shoulder: Secondary | ICD-10-CM | POA: Diagnosis not present

## 2015-09-06 DIAGNOSIS — K219 Gastro-esophageal reflux disease without esophagitis: Secondary | ICD-10-CM

## 2015-09-06 DIAGNOSIS — C50919 Malignant neoplasm of unspecified site of unspecified female breast: Secondary | ICD-10-CM | POA: Diagnosis not present

## 2015-09-06 DIAGNOSIS — R252 Cramp and spasm: Secondary | ICD-10-CM

## 2015-09-06 DIAGNOSIS — C50912 Malignant neoplasm of unspecified site of left female breast: Secondary | ICD-10-CM

## 2015-09-06 DIAGNOSIS — Z79810 Long term (current) use of selective estrogen receptor modulators (SERMs): Secondary | ICD-10-CM | POA: Diagnosis not present

## 2015-09-06 DIAGNOSIS — B964 Proteus (mirabilis) (morganii) as the cause of diseases classified elsewhere: Secondary | ICD-10-CM

## 2015-09-06 DIAGNOSIS — E876 Hypokalemia: Secondary | ICD-10-CM

## 2015-09-06 DIAGNOSIS — Z79818 Long term (current) use of other agents affecting estrogen receptors and estrogen levels: Secondary | ICD-10-CM

## 2015-09-06 LAB — CBC WITH DIFFERENTIAL/PLATELET
Basophils Absolute: 0 10*3/uL (ref 0–0.1)
Basophils Relative: 0 %
Eosinophils Absolute: 0.1 10*3/uL (ref 0–0.7)
Eosinophils Relative: 2 %
HCT: 36.2 % (ref 35.0–47.0)
Hemoglobin: 12.1 g/dL (ref 12.0–16.0)
Lymphocytes Relative: 11 %
Lymphs Abs: 0.6 10*3/uL — ABNORMAL LOW (ref 1.0–3.6)
MCH: 28.3 pg (ref 26.0–34.0)
MCHC: 33.6 g/dL (ref 32.0–36.0)
MCV: 84.2 fL (ref 80.0–100.0)
Monocytes Absolute: 0.5 10*3/uL (ref 0.2–0.9)
Monocytes Relative: 9 %
Neutro Abs: 4 10*3/uL (ref 1.4–6.5)
Neutrophils Relative %: 78 %
Platelets: 319 10*3/uL (ref 150–440)
RBC: 4.3 MIL/uL (ref 3.80–5.20)
RDW: 14.6 % — ABNORMAL HIGH (ref 11.5–14.5)
WBC: 5.2 10*3/uL (ref 3.6–11.0)

## 2015-09-06 LAB — COMPREHENSIVE METABOLIC PANEL
ALT: 24 U/L (ref 14–54)
AST: 29 U/L (ref 15–41)
Albumin: 3.4 g/dL — ABNORMAL LOW (ref 3.5–5.0)
Alkaline Phosphatase: 98 U/L (ref 38–126)
Anion gap: 8 (ref 5–15)
BUN: 8 mg/dL (ref 6–20)
CO2: 26 mmol/L (ref 22–32)
Calcium: 8.5 mg/dL — ABNORMAL LOW (ref 8.9–10.3)
Chloride: 102 mmol/L (ref 101–111)
Creatinine, Ser: 0.59 mg/dL (ref 0.44–1.00)
GFR calc Af Amer: >60 mL/min (ref >60)
GFR calc non Af Amer: >60 mL/min (ref >60)
Glucose, Bld: 118 mg/dL — ABNORMAL HIGH (ref 65–99)
Potassium: 3.2 mmol/L — ABNORMAL LOW (ref 3.5–5.1)
Sodium: 136 mmol/L (ref 135–145)
Total Bilirubin: 0.7 mg/dL (ref 0.3–1.2)
Total Protein: 7.2 g/dL (ref 6.5–8.1)

## 2015-09-06 LAB — MAGNESIUM: Magnesium: 2 mg/dL (ref 1.7–2.4)

## 2015-09-06 MED ORDER — POTASSIUM CHLORIDE CRYS ER 20 MEQ PO TBCR: 20.0000 meq | EXTENDED_RELEASE_TABLET | Freq: Two times a day (BID) | ORAL | Status: DC

## 2015-09-06 MED ORDER — FULVESTRANT 250 MG/5ML IM SOLN
500.0000 mg | INTRAMUSCULAR | Status: DC
  Administered 2015-09-06: 500 mg via INTRAMUSCULAR
  Filled 2015-09-06: qty 10

## 2015-09-06 NOTE — Progress Notes (Signed)
Pt reports that after shaving last June 8 she saw what she believes as petechiae and her bruising is staying longer.  Pt reports after her last faslodex injection she had cramps in her feet and her right hand.  Loss of hair 8-10 June.  Pt reports she continues to have diarrhea at least 3 times a day.

## 2015-09-06 NOTE — Progress Notes (Signed)
[facility] day:  09/06/2015   Chief Complaint: Melinda Tanner Melinda Tanner is a 45 y.o. 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   HPI:  The patient was last seen in the medical oncology clinic on 08/23/2015.  At that time, PET scan was reviewed. Imaging revealed a mixed response with improvement in the left humeral head and left sacral lesion aggressive the disease in the left iliac 10 and a new area in the left acetabular region. She was scheduled to begin radiation in 08/24/2015 to the left humerus and back.  At last visit, tamoxifen was discontinued. She was started on Faslodex. She received her monthly Xgeva. She was on a Decadron taper per Dr. Baruch Gouty.  She is scheduled to complete her radiation on 09/08/2015.  She states that she began to have diarrhea on 09/02/2015. Diarrhea was initially severe. She described having 3 episodes in the morning. She comments that "Imodium calmed things down".  Yesterday she had a 1-2 bowel movements. Today she has only had one.  Patient notes cramping in her feet and her right hand. Appetite has been poor over the past 5 days. She completes her Decadron taper on 09/09/2015.   Past Medical History  Diagnosis Date  . Breast cancer ([place]) 2009  . Seizures ([place])   . Brain cancer ([place]) 2012    Met. from Breast  . Complication of anesthesia     nausea, "drops in potassium and magnesium"    Past Surgical History  Procedure Laterality Date  . Mastectomy Bilateral 2010  . Brain surgery  2012, 2106  . Cesarean section    . Laparoscopic bilateral salpingo oopherectomy Bilateral 05/18/2015    Procedure: LAPAROSCOPIC BILATERAL SALPINGO OOPHORECTOMY;  Surgeon: Will Bonnet, MD;  Location: [facility] ORS;  Service: Gynecology;  Laterality: Bilateral;    Family History  Problem Relation Age of Onset  . Cancer Paternal Aunt   . Cancer Paternal Uncle    . Cancer Paternal Grandfather   . Hypertension Brother   . Diabetes Paternal Grandmother   A paternal aunt had breast cancer age 33, a paternal uncle had prostate cancer, and a paternal grandfather had leukemia.  Social History:  reports that she has never smoked. She does not have any smokeless tobacco history on file. She reports that she does not drink alcohol or use illicit drugs.  She is from [place].  She moved to [place] in 2015.  She moved to [place] in 04/2014.  She recently moved into the [place] area.  She has 2 children (boy and girl). The patient is accompanied by her 64 year old daughter, Joaquim Lai, and the [place] interpreter today.  Allergies:  Allergies  Allergen Reactions  . Taxol [Paclitaxel] Anaphylaxis  . Aspirin Swelling  . Penicillins Swelling  . Zofran [Ondansetron Hcl] Nausea And Vomiting    Current Medications: Current Outpatient Prescriptions  Medication Sig Dispense Refill  . dexamethasone (DECADRON) 2 MG tablet Take 1 tablet (2 mg total) by mouth daily. Take 1 tablet daily x 1 week and then take 1 tablet every other day for 1 week , then stop taking 11 tablet 0  . omeprazole (PRILOSEC) 40 MG capsule Take 1 capsule (40 mg total) by mouth as needed. Reported on 08/13/2015 30 capsule 3  . palbociclib (IBRANCE) 125 MG capsule Take 1 capsule daily for 21 days and then take 1 week off and repeat cycle  again (Take whole with food.) 21 capsule 3  . pantoprazole (PROTONIX) 20 MG tablet TK 1 T PO D  3  . oxycodone (OXY-IR) 5 MG capsule Take 1 capsule (5 mg total) by mouth every 4 (four) hours as needed. (Patient not taking: Reported on 09/06/2015) 30 capsule 0   No current facility-administered medications for this visit.    Review of Systems:  GENERAL:  Feels "ok".  No fevers or sweats.  Weight down 2 pounds. PERFORMANCE STATUS (ECOG):  1 HEENT:  No visual changes, runny nose, sore throat, mouth sores or tenderness. Lungs: No shortness of breath or  cough.  No hemoptysis. Cardiac:  No chest pain, palpitations, orthopnea, or PND. GI:  Appetite decreased.  Diarrhea (see HPI).  No nausea, vomiting, constipation, melena or hematochezia. GU:  No urgency, frequency, dysuria or hematuria. Musculoskeletal:  Cramping in feet and right hand.  Pain in left upper arm, resolved.  Back pain, stable.  No muscle tenderness. Extremities:  No pain or swelling. Skin:  No rashes, skin changes or ulcers.  Neuro:  No headache, numbness or weakness, balance or coordination issues.  Endocrine:  No diabetes, thyroid issues, hot flashes or night sweats. Psych:  No mood changes, depression or anxiety. Pain:  No focal pain. Review of systems:  All other systems reviewed and found to be negative.  Physical Exam: Blood pressure 110/81, pulse 93, temperature 97.3 F (36.3 C), temperature source Tympanic, resp. rate 18, height 5' 3"  (1.6 m), weight 149 lb 12.8 oz (67.95 kg), last menstrual period 03/19/2015. GENERAL:  Well developed, well nourished, sitting comfortably in the exam room in no acute distress.  MENTAL STATUS:  Alert and oriented to person, place and time. HEAD: Long black hair.  Normocephalic, atraumatic, face symmetric, no Cushingoid features. EYES:  Brown eyes. Pupils equal round and reactive to light and accomodation.  No conjunctivitis or scleral icterus. ENT:  Oropharynx clear without lesion.  Tongue normal. Mucous membranes moist.  RESPIRATORY:  Clear to auscultation without rales, wheezes or rhonchi. CARDIOVASCULAR:  Regular rate and rhythm without murmur, rub or gallop. ABDOMEN:  Soft, non-tender, with active bowel sounds, and no hepatosplenomegaly.  No masses. SKIN:  No rashes, ulcers or lesions. EXTREMITIES: No edema, no skin discoloration or tenderness.  No palpable cords. LYMPH NODES: 1 cm low right cervical node (stable).  No palpable supraclavicular, axillary or inguinal adenopathy  NEUROLOGICAL:  Appropriate. PSYCH:  Appropriate.      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 No lymph invasion. Tumor cellularity 20%. [id number]. 07/13/2008: Left mastectomy: Residual ductal carcinoma in situ, high nuclear grade, deep margin negative ypTisNO(sn)MX. 10/25/2010: Abdominal and pelvic ultrasound: questionable lesion near gallbladder fossa recommend MRI follow up. 03/02/2011: Left and right frontal excisional brain biopsy: Adenocarcinoma, metastatic, NOS: ER +, PR 5% +, Her 2 NEG. 03/04/2011: [facility] testing of IHC4 residual recurrence risk score of 114 showing an 8 year recurrence reate of 57%. ER POSTIVE, PR POSITIVE, HER 2 POSTIVE (previously documented negative in 2009) cutoff of 361 patient scores 426. ki67 71%.  08/05/2013: Right breast FNA supraclavicular region: positive for metastatic carcinoma. ER/PR/HER2 not reported. 08/19/2014: Right cervical lymph node. Adenocarcinoma with features consistent with metastatic breast carcinoma. ER/PR/HER2  insufficient tissue. 09/04/2014: Repeat craniotomy, resection of right frontal tumor (metastatic breast cancer). ER 90-100% PR 51-60% HER2 - 09/29/2014: Right cervical lymph node, metastatic carcinoma c/w breast primary, ER 100% PR 20% HER2-  Imaging: 08/15/2007: Bilateral Mammogram: Dense breasts with solid lesion at 6:00, 7:00, and 9:00 of right breast, solid lesion at 2:00 position left breast BIRADS 4B. 09/10/2007: Breast MRI: Three solid irregular enhancing lesions of the right breast 2.1 x 3.2, 2.8 x 2.7, and 1.8 x 1.6 cm. Mildly enlarged enhancing nodule right axilla. Left breast clumped enhancement midportion suspicious for  malignancy. 03/01/2011: Brain MRI: At least two juxtacortical, intra-axial masses with extensive surrounding vasogenic edema most consistent with intracranial metastasis. 07/22/2013: Brain MRI +/- contrast: interval increase in enhancing component on T2 FLAIR now measuring 1.0cm x 1.1 cm worrisome for progression of disease. 01/21/2014:  PET scan: Hypermetabolic small right supraclavicular and right upper paratracheal lymph nodes suspicious for mets. Hypermetabolic lymph node in the right paratracheal upper mediastinum that measure approximately 0.9 x 0.5cm. (of note no impressions made of areas noted on brain MRI 07/22/13).  01/21/2014: MRI Brain: Right frontal enhancing lesion has shown interval increase in size with surrounding edema and local mass effect (measured 1.8x2.1x1.6, previously 1.2x1.1x1.0) 07/16/2014: Brain MRI:  Right anterior frontal enhancing mass 2.5 x 2.0 cm compatible with a metastatic lesion has increased and changed in configuration from previous 2.0 x 1.8 cm, formerly multilobular. 08/06/2014: PET scan:  Multiple bilateral FDG avid low cervical, supraclavicular, upper mediastinal, and right internal mammary lymphadenopathy, likely representing metastatic disease. A cervical node in the right lower neck should be amenable to percutaneous sampling.  12/2014: Head MRI:  Evolution of right frontal postoperative changes status post tumor resection at the vertex. Expected postoperative changes. Minimal marginal enhancement resection bed with some adjacent posterior intrinsic T1 signal again seen. Decreasing T2/FLAIR adjacent parenchymal changes.  Stable resection cavity is left posterior frontal and right parieto-occipital regions.  Labs:  Appointment on 09/06/2015  Component Date Value Ref Range Status  . WBC 09/06/2015 5.2  3.6 - 11.0 K/uL Final  . RBC 09/06/2015 4.30  3.80 - 5.20 MIL/uL Final  . Hemoglobin 09/06/2015 12.1  12.0 - 16.0 g/dL Final  . HCT 09/06/2015 36.2  35.0 - 47.0 %  Final  . MCV 09/06/2015 84.2  80.0 - 100.0 fL Final  . MCH 09/06/2015 28.3  26.0 - 34.0 pg Final  . MCHC 09/06/2015 33.6  32.0 - 36.0 g/dL Final  . RDW 09/06/2015 14.6* 11.5 - 14.5 % Final  . Platelets 09/06/2015 319  150 - 440 K/uL Final  . Neutrophils Relative % 09/06/2015 78   Final  . Neutro Abs 09/06/2015 4.0  1.4 - 6.5 K/uL Final  . Lymphocytes Relative 09/06/2015 11   Final  . Lymphs Abs 09/06/2015 0.6* 1.0 - 3.6 K/uL Final  . Monocytes Relative 09/06/2015 9   Final  . Monocytes Absolute 09/06/2015 0.5  0.2 - 0.9 K/uL Final  . Eosinophils Relative 09/06/2015 2   Final  . Eosinophils Absolute 09/06/2015 0.1  0 - 0.7 K/uL Final  . Basophils Relative 09/06/2015 0   Final  . Basophils Absolute 09/06/2015 0.0  0 - 0.1 K/uL Final  . Sodium 09/06/2015 136  135 - 145 mmol/L Final  . Potassium 09/06/2015 3.2* 3.5 - 5.1 mmol/L Final  . Chloride 09/06/2015 102  101 - 111 mmol/L Final  . CO2 09/06/2015 26  22 - 32 mmol/L Final  . Glucose, Bld 09/06/2015 118* 65 - 99  mg/dL Final  . BUN 09/06/2015 8  6 - 20 mg/dL Final  . Creatinine, Ser 09/06/2015 0.59  0.44 - 1.00 mg/dL Final  . Calcium 09/06/2015 8.5* 8.9 - 10.3 mg/dL Final  . Total Protein 09/06/2015 7.2  6.5 - 8.1 g/dL Final  . Albumin 09/06/2015 3.4* 3.5 - 5.0 g/dL Final  . AST 09/06/2015 29  15 - 41 U/L Final  . ALT 09/06/2015 24  14 - 54 U/L Final  . Alkaline Phosphatase 09/06/2015 98  38 - 126 U/L Final  . Total Bilirubin 09/06/2015 0.7  0.3 - 1.2 mg/dL Final  . GFR calc non Af Amer 09/06/2015 >60  >60 mL/min Final  . GFR calc Af Amer 09/06/2015 >60  >60 mL/min Final   Comment: (NOTE) The eGFR has been calculated using the CKD EPI equation. This calculation has not been validated in all clinical situations. eGFR's persistently <60 mL/min signify possible Chronic Kidney Disease.   . Anion gap 09/06/2015 8  5 - 15 Final    Assessment:  Melinda Tanner Melinda Tanner is a 45 y.o. female with metastatic breast cancer to brain and lymph  nodes.  She initially presented in 2009 while living in [place] with multi-focal right breast cancer with positive lymph node(s) which was ER+, PR+(low), and HER2/neu+. She received neoadjuvant chemotherapy (AC x 4 every 3 weeks followed by Taxol/Abraxane + carboplatin weekly x 12) without anti-HER2 treatment.    On 07/14/2008, she underwent bilateral mastectomies followed by reconstruction.  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   Zero of 11 lymph nodes on the right were positive for malignancy. 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 Deep margin was negative. Zero of 2 lymph nodes were positive for metastatic disease.  She received adjuvant radiation to the right breast.  She received adjuvant tamoxifen and Zoladex.  She could not tolerate symptoms of joint pain and tamoxifen was discontinued after 1-2 months.  Zoladex was continued for approximately 1.5 years.  She was diagnosed with brain metastases in 02/2011.  Pathology revealed ER+, PR+(low), and HER2/neu-.  She underwent resection followed by [facility].  On 03/09/2011, original breast tissue (09/03/2007) sent to [facility] testing.  Testing revealed ER+, PR+(borderline), and HER2/neu positive (different than initial testing).  She received Herceptin for 1 year beginning in 2013.  She then began Tykerb and Xeloda after completion of Herceptin (2014).  She was on extremely low, and probably sub-therapeutic, dosing of lapatinib + capecitabine.   She developed right supraclavicular adenopathy in 2015. She was noted to have slow disease progression in the right frontal/parietal lesion with minimal presence of systemic disease on PET scans. Capecitabine + lapatinib were discontinued in 04/2014.   She underwent repeat craniotomy with resection of brain metastasis on 09/04/2014.  Biopsy of the right supraclavicular lymph node  was ER+,PR+,HER2/neu-.  She started tamoxifen in 09/2014,  but discontinued it secondary to pain in hip, back, and shoulder.  PET scan on 04/20/2015 revealed cervical and thoracic nodal metastasis.  There was multifocal osseous metastasis (left transvers T4 process, left humeral head, right iliac wing, and left side of the sacrum).  Incidental findings included right nephrolithiasis.    CA27.29 was 177.8 on 04/16/2015, 276.4 on 05/28/2015, 287.5 on 07/13/2015, 333.8 on 07/26/2015, and 412.9 on 08/23/2015.  Bone scan on 04/22/2015 corresponded with PET-CT with metastatic lesions evident in the left humeral head, left posterior aspect of T4, left aspect of S1, and the right iliac  crest.  There was no other definite foci of metastatic disease to bone.  Uptake in the skull was most suggestive of the previous right frontal craniotomy.  Bone density study on 05/24/2015 revealed a T-score of -0.8 in the right femoral neck (normal).  She restarted tamoxifen on 04/23/2015.  Tamoxifen was discontinued on 08/23/2015 secondary to progressive disease.  She received Zoladex on 04/30/2015.  She underwent laparoscopic bilateral oophorectomy on 05/18/2015.  She receives monthly Xgeva (began 04/30/2015; last 08/23/2015).  She was diagnosed with a Proteus mirabilis UTI on 07/13/2015 and was treated with a course of ciprofloxacin.  Head MRI on 07/07/2015 revealed metastatic breast cancer with 3 resection cavities. The anterior right frontal cavity was positive for nodular marginal enhancement, new from brain MRI report 01/07/2015, and consistent with recurrent disease.  She completed cranial radiation.    Lumbar spine MRI on 08/12/2015 revealed osseous metastatic disease at L3, L4, L5, S1, and in the left iliac bone posteriorly. There was no definite epidural metastatic disease.  PET scan on 08/20/2015 revealed a response to therapy in the lower cervical and thoracic adenopathy.  There was a mixed response to multifocal osseous metastases (some new lesions).   She began  Faslodex on 08/23/2015.  Patient began palliative radiation to the left humerus and spine beginning 08/24/2015.  Symptomatically, she has had diarrhea x 4 days.  She has had cramping in her feet and right hand.  Appetite is poor.  She has hypokalemia (3.2).  Plan: 1.  Labs today:  CBC with diff, CMP, Mg. 2.  Faslodex today. 3.  Discuss plan to initiate Ibrance at completion of radiation (at next visit). 4.  Rx:  Potassium 20 meq po BID x 1 day then 20 meq po q day x 1 day. 5.  RTC in 2 weeks for MD assess, labs (CBC with diff, CMP, CA27.29), Faslodex and Xgeva.   Lequita Asal, MD  09/06/2015, 2:45 PM

## 2015-09-07 ENCOUNTER — Ambulatory Visit
Admission: RE | Admit: 2015-09-07 | Discharge: 2015-09-07 | Disposition: A | Discharge status: Home / Self Care | Payer: Medicare HMO | Source: Ambulatory Visit | Attending: Radiation Oncology | Admitting: Radiation Oncology

## 2015-09-07 ENCOUNTER — Ambulatory Visit: Payer: Medicare HMO

## 2015-09-07 DIAGNOSIS — Z170 Estrogen receptor positive status [ER+]: Secondary | ICD-10-CM | POA: Diagnosis not present

## 2015-09-07 DIAGNOSIS — Z510 Encounter for antineoplastic radiation therapy: Secondary | ICD-10-CM | POA: Diagnosis not present

## 2015-09-07 DIAGNOSIS — C50919 Malignant neoplasm of unspecified site of unspecified female breast: Secondary | ICD-10-CM | POA: Diagnosis not present

## 2015-09-07 DIAGNOSIS — M25512 Pain in left shoulder: Secondary | ICD-10-CM | POA: Diagnosis not present

## 2015-09-07 DIAGNOSIS — M545 Low back pain: Secondary | ICD-10-CM | POA: Diagnosis not present

## 2015-09-07 DIAGNOSIS — C7951 Secondary malignant neoplasm of bone: Secondary | ICD-10-CM | POA: Diagnosis not present

## 2015-09-07 DIAGNOSIS — Z79810 Long term (current) use of selective estrogen receptor modulators (SERMs): Secondary | ICD-10-CM | POA: Diagnosis not present

## 2015-09-07 DIAGNOSIS — Z923 Personal history of irradiation: Secondary | ICD-10-CM | POA: Diagnosis not present

## 2015-09-07 DIAGNOSIS — C7931 Secondary malignant neoplasm of brain: Secondary | ICD-10-CM | POA: Diagnosis not present

## 2015-09-07 DIAGNOSIS — Z8669 Personal history of other diseases of the nervous system and sense organs: Secondary | ICD-10-CM | POA: Diagnosis not present

## 2015-09-07 LAB — CANCER ANTIGEN 27.29: CA 27.29: 407.3 U/mL — ABNORMAL HIGH (ref 0.0–38.6)

## 2015-09-08 ENCOUNTER — Ambulatory Visit: Payer: Medicare HMO

## 2015-09-08 ENCOUNTER — Ambulatory Visit
Admission: RE | Admit: 2015-09-08 | Discharge: 2015-09-08 | Disposition: A | Discharge status: Home / Self Care | Payer: Medicare HMO | Source: Ambulatory Visit | Attending: Radiation Oncology | Admitting: Radiation Oncology

## 2015-09-09 ENCOUNTER — Ambulatory Visit
Admission: RE | Admit: 2015-09-09 | Discharge: 2015-09-09 | Disposition: A | Discharge status: Home / Self Care | Payer: Medicare HMO | Source: Ambulatory Visit | Attending: Radiation Oncology | Admitting: Radiation Oncology

## 2015-09-09 DIAGNOSIS — Z923 Personal history of irradiation: Secondary | ICD-10-CM | POA: Diagnosis not present

## 2015-09-09 DIAGNOSIS — Z510 Encounter for antineoplastic radiation therapy: Secondary | ICD-10-CM | POA: Diagnosis not present

## 2015-09-09 DIAGNOSIS — Z170 Estrogen receptor positive status [ER+]: Secondary | ICD-10-CM | POA: Diagnosis not present

## 2015-09-09 DIAGNOSIS — M545 Low back pain: Secondary | ICD-10-CM | POA: Diagnosis not present

## 2015-09-09 DIAGNOSIS — Z79810 Long term (current) use of selective estrogen receptor modulators (SERMs): Secondary | ICD-10-CM | POA: Diagnosis not present

## 2015-09-09 DIAGNOSIS — C7951 Secondary malignant neoplasm of bone: Secondary | ICD-10-CM | POA: Diagnosis not present

## 2015-09-09 DIAGNOSIS — Z8669 Personal history of other diseases of the nervous system and sense organs: Secondary | ICD-10-CM | POA: Diagnosis not present

## 2015-09-09 DIAGNOSIS — C50919 Malignant neoplasm of unspecified site of unspecified female breast: Secondary | ICD-10-CM | POA: Diagnosis not present

## 2015-09-09 DIAGNOSIS — M25512 Pain in left shoulder: Secondary | ICD-10-CM | POA: Diagnosis not present

## 2015-09-09 DIAGNOSIS — C7931 Secondary malignant neoplasm of brain: Secondary | ICD-10-CM | POA: Diagnosis not present

## 2015-09-13 ENCOUNTER — Inpatient Hospital Stay: Payer: Medicare HMO

## 2015-09-13 DIAGNOSIS — C7951 Secondary malignant neoplasm of bone: Secondary | ICD-10-CM

## 2015-09-13 DIAGNOSIS — C50911 Malignant neoplasm of unspecified site of right female breast: Secondary | ICD-10-CM

## 2015-09-13 DIAGNOSIS — E876 Hypokalemia: Secondary | ICD-10-CM

## 2015-09-13 DIAGNOSIS — R197 Diarrhea, unspecified: Secondary | ICD-10-CM

## 2015-09-13 DIAGNOSIS — R252 Cramp and spasm: Secondary | ICD-10-CM

## 2015-09-13 LAB — BASIC METABOLIC PANEL
Anion gap: 6 (ref 5–15)
BUN: 7 mg/dL (ref 6–20)
CO2: 26 mmol/L (ref 22–32)
Calcium: 8.1 mg/dL — ABNORMAL LOW (ref 8.9–10.3)
Chloride: 105 mmol/L (ref 101–111)
Creatinine, Ser: 0.51 mg/dL (ref 0.44–1.00)
GFR calc Af Amer: >60 mL/min (ref >60)
GFR calc non Af Amer: >60 mL/min (ref >60)
Glucose, Bld: 108 mg/dL — ABNORMAL HIGH (ref 65–99)
Potassium: 3.5 mmol/L (ref 3.5–5.1)
Sodium: 137 mmol/L (ref 135–145)

## 2015-09-19 ENCOUNTER — Other Ambulatory Visit: Payer: Self-pay | Admitting: Hematology and Oncology

## 2015-09-20 ENCOUNTER — Inpatient Hospital Stay: Payer: Medicare HMO | Attending: Hematology and Oncology | Admitting: Hematology and Oncology

## 2015-09-20 ENCOUNTER — Other Ambulatory Visit: Payer: Self-pay

## 2015-09-20 ENCOUNTER — Inpatient Hospital Stay: Payer: Medicare HMO

## 2015-09-20 VITALS — BP 115/72 | HR 83 | Temp 98.3°F | Resp 18 | Wt 151.3 lb

## 2015-09-20 DIAGNOSIS — Z923 Personal history of irradiation: Secondary | ICD-10-CM | POA: Diagnosis not present

## 2015-09-20 DIAGNOSIS — R252 Cramp and spasm: Secondary | ICD-10-CM

## 2015-09-20 DIAGNOSIS — Z9221 Personal history of antineoplastic chemotherapy: Secondary | ICD-10-CM | POA: Insufficient documentation

## 2015-09-20 DIAGNOSIS — R197 Diarrhea, unspecified: Secondary | ICD-10-CM

## 2015-09-20 DIAGNOSIS — Z79818 Long term (current) use of other agents affecting estrogen receptors and estrogen levels: Secondary | ICD-10-CM | POA: Insufficient documentation

## 2015-09-20 DIAGNOSIS — R21 Rash and other nonspecific skin eruption: Secondary | ICD-10-CM | POA: Insufficient documentation

## 2015-09-20 DIAGNOSIS — C50911 Malignant neoplasm of unspecified site of right female breast: Secondary | ICD-10-CM | POA: Insufficient documentation

## 2015-09-20 DIAGNOSIS — Z170 Estrogen receptor positive status [ER+]: Secondary | ICD-10-CM | POA: Diagnosis not present

## 2015-09-20 DIAGNOSIS — Z79899 Other long term (current) drug therapy: Secondary | ICD-10-CM | POA: Diagnosis not present

## 2015-09-20 DIAGNOSIS — G939 Disorder of brain, unspecified: Secondary | ICD-10-CM

## 2015-09-20 DIAGNOSIS — E876 Hypokalemia: Secondary | ICD-10-CM

## 2015-09-20 DIAGNOSIS — C7931 Secondary malignant neoplasm of brain: Secondary | ICD-10-CM | POA: Insufficient documentation

## 2015-09-20 DIAGNOSIS — C7951 Secondary malignant neoplasm of bone: Secondary | ICD-10-CM | POA: Insufficient documentation

## 2015-09-20 LAB — CBC WITH DIFFERENTIAL/PLATELET
Basophils Absolute: 0.1 10*3/uL (ref 0–0.1)
Basophils Relative: 1 %
Eosinophils Absolute: 0.5 10*3/uL (ref 0–0.7)
Eosinophils Relative: 8 %
HCT: 35.8 % (ref 35.0–47.0)
Hemoglobin: 11.8 g/dL — ABNORMAL LOW (ref 12.0–16.0)
Lymphocytes Relative: 10 %
Lymphs Abs: 0.6 10*3/uL — ABNORMAL LOW (ref 1.0–3.6)
MCH: 28.1 pg (ref 26.0–34.0)
MCHC: 33 g/dL (ref 32.0–36.0)
MCV: 85.2 fL (ref 80.0–100.0)
Monocytes Absolute: 0.6 10*3/uL (ref 0.2–0.9)
Monocytes Relative: 10 %
Neutro Abs: 4.6 10*3/uL (ref 1.4–6.5)
Neutrophils Relative %: 71 %
Platelets: 279 10*3/uL (ref 150–440)
RBC: 4.2 MIL/uL (ref 3.80–5.20)
RDW: 15 % — ABNORMAL HIGH (ref 11.5–14.5)
WBC: 6.4 10*3/uL (ref 3.6–11.0)

## 2015-09-20 LAB — COMPREHENSIVE METABOLIC PANEL
ALT: 17 U/L (ref 14–54)
AST: 24 U/L (ref 15–41)
Albumin: 3.6 g/dL (ref 3.5–5.0)
Alkaline Phosphatase: 80 U/L (ref 38–126)
Anion gap: 6 (ref 5–15)
BUN: 12 mg/dL (ref 6–20)
CO2: 25 mmol/L (ref 22–32)
Calcium: 8.8 mg/dL — ABNORMAL LOW (ref 8.9–10.3)
Chloride: 107 mmol/L (ref 101–111)
Creatinine, Ser: 0.51 mg/dL (ref 0.44–1.00)
GFR calc Af Amer: >60 mL/min (ref >60)
GFR calc non Af Amer: >60 mL/min (ref >60)
Glucose, Bld: 86 mg/dL (ref 65–99)
Potassium: 3.6 mmol/L (ref 3.5–5.1)
Sodium: 138 mmol/L (ref 135–145)
Total Bilirubin: 0.4 mg/dL (ref 0.3–1.2)
Total Protein: 7.5 g/dL (ref 6.5–8.1)

## 2015-09-20 MED ORDER — FULVESTRANT 250 MG/5ML IM SOLN
500.0000 mg | INTRAMUSCULAR | Status: DC
Start: 2015-09-20 — End: 2015-09-20
  Administered 2015-09-20: 500 mg via INTRAMUSCULAR
  Filled 2015-09-20 (×2): qty 10

## 2015-09-20 MED ORDER — DENOSUMAB 120 MG/1.7ML ~~LOC~~ SOLN
120.0000 mg | Freq: Once | SUBCUTANEOUS | Status: AC
  Administered 2015-09-20: 120 mg via SUBCUTANEOUS
  Filled 2015-09-20: qty 1.7

## 2015-09-20 NOTE — Progress Notes (Signed)
Patient is here for follow up, patient mentions after breakfast she does feel tired and wants to go back to bed. Has area on right side of back lower bumps, and they are itching. Not sure if its due to medication or due to her radiation.

## 2015-09-20 NOTE — Progress Notes (Signed)
[facility] day:  09/20/2015   Chief Complaint: Melinda Tanner is a 45 y.o. 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   HPI:  The patient was last seen in the medical oncology clinic on 09/06/2015.  At that time, she had diarrhea secondary to radiation.  She has hypokalemia for which potassium supplementation was prescribed. She completed her Faslodex load.  During the interim, she completed radiation to her left shoulder and lumbar spine (08/25/2015 - 09/09/2015).  She denies any pain.  Her Decadron taper completed on 09/09/2015.  She denies any diarrhea.  She took her 3 days of potassium.  She has a lower back puritc rash.  She is losing her hair s/p XRT.   Past Medical History  Diagnosis Date  . Breast cancer ([place]) 2009  . Seizures ([place])   . Brain cancer ([place]) 2012    Met. from Breast  . Complication of anesthesia     nausea, "drops in potassium and magnesium"    Past Surgical History  Procedure Laterality Date  . Mastectomy Bilateral 2010  . Brain surgery  2012, 2106  . Cesarean section    . Laparoscopic bilateral salpingo oopherectomy Bilateral 05/18/2015    Procedure: LAPAROSCOPIC BILATERAL SALPINGO OOPHORECTOMY;  Surgeon: Will Bonnet, MD;  Location: [facility] ORS;  Service: Gynecology;  Laterality: Bilateral;    Family History  Problem Relation Age of Onset  . Cancer Paternal Aunt   . Cancer Paternal Uncle   . Cancer Paternal Grandfather   . Hypertension Brother   . Diabetes Paternal Grandmother   A paternal aunt had breast cancer age 76, a paternal uncle had prostate cancer, and a paternal grandfather had leukemia.  Social History:  reports that she has never smoked. She does not have any smokeless tobacco history on file. She reports that she does not drink alcohol or use illicit drugs.  [place]  She moved to [place] in 2015.  She moved to [place] in 04/2014.  She recently moved into the [place] area.  She has 2 children (boy and girl). The patient is accompanied by the Spanish interpreter today.  Allergies:  Allergies  Allergen Reactions  . Taxol [Paclitaxel] Anaphylaxis  . Aspirin Swelling  . Penicillins Swelling  . Zofran [Ondansetron Hcl] Nausea And Vomiting    Current Medications: Current Outpatient Prescriptions  Medication Sig Dispense Refill  . omeprazole (PRILOSEC) 20 MG capsule Take 20 mg by mouth daily.  2  . oxycodone (OXY-IR) 5 MG capsule Take 1 capsule (5 mg total) by mouth every 4 (four) hours as needed. 30 capsule 0  . pantoprazole (PROTONIX) 20 MG tablet TK 1 T PO D  3  . potassium chloride SA (K-DUR,KLOR-CON) 20 MEQ tablet Take 1 tablet (20 mEq total) by mouth 2 (two) times daily. for 1 days then 1 pill a day x 2 days. 10 tablet 0  . dexamethasone (DECADRON) 2 MG tablet Take 1 tablet (2 mg total) by mouth daily. Take 1 tablet daily x 1 week and then take 1 tablet every other day for 1 week , then stop taking (Patient not taking: Reported on 09/20/2015) 11 tablet 0  . palbociclib (IBRANCE) 125 MG capsule Take 1 capsule daily for 21 days and then take 1 week off and repeat cycle again (Take whole with food.) (Patient not taking: Reported  on 09/20/2015) 21 capsule 3   No current facility-administered medications for this visit.    Review of Systems:  GENERAL:  Feels good.  No fevers or sweats.  Weight up 2 pounds. PERFORMANCE STATUS (ECOG):  1 HEENT:  No visual changes, runny nose, sore throat, mouth sores or tenderness. Lungs: No shortness of breath or cough.  No hemoptysis. Cardiac:  No chest pain, palpitations, orthopnea, or PND. GI:  Appetite good.  No nausea, vomiting, diarrhea, constipation, melena or hematochezia. GU:  No urgency, frequency, dysuria or hematuria. Musculoskeletal:  Pain in left upper arm, resolved.  Back pain, resolved.  No  muscle tenderness. Extremities:  No pain or swelling. Skin:  No rashes, skin changes or ulcers.  Neuro:  No headache, numbness or weakness, balance or coordination issues.  Endocrine:  No diabetes, thyroid issues, hot flashes or night sweats. Psych:  No mood changes, depression or anxiety. Pain:  No focal pain. Review of systems:  All other systems reviewed and found to be negative.  Physical Exam: Blood pressure 115/72, pulse 83, temperature 98.3 F (36.8 C), temperature source Tympanic, resp. rate 18, weight 151 lb 5.5 oz (68.65 kg), last menstrual period 03/19/2015. GENERAL:  Well developed, well nourished, woman sitting comfortably in the exam room in no acute distress.  MENTAL STATUS:  Alert and oriented to person, place and time. HEAD: Wearing a blue head wrap.  Long black braided hair with large area of alopecia s/p XRT.  Normocephalic, atraumatic, face symmetric, no Cushingoid features. EYES:  Brown eyes. Pupils equal round and reactive to light and accomodation.  No conjunctivitis or scleral icterus. ENT:  Oropharynx clear without lesion.  Tongue normal. Mucous membranes moist.  RESPIRATORY:  Clear to auscultation without rales, wheezes or rhonchi. CARDIOVASCULAR:  Regular rate and rhythm without murmur, rub or gallop. ABDOMEN:  Soft, non-tender, with active bowel sounds, and no hepatosplenomegaly.  No masses. SKIN:  Lumbar spin with hyperpigmentation and associated rash s/p XRT with fine eschars.  No vesicles. EXTREMITIES: No edema, no skin discoloration or tenderness.  No palpable cords. LYMPH NODES: 1 cm low right cervical node (stable).  No palpable supraclavicular, axillary or inguinal adenopathy  NEUROLOGICAL:  Appropriate. PSYCH:  Appropriate.     Pathology: 09/03/2007: Right breast core biopsy: infiltrating duct cell carcinoma high grade ER/PR (reportedly positive) Her2 (?) 10/02/2007: Right axillary node core biopsy: Fragment with metastatic carcinoma 10/23/2007: Left  breast core biopsy: DCIS high grade ER/PR (?) 07/13/2008: Right mastectomy: Invasive ductal carcinoma Grade III Nottingham score = tubules 3+ Nuclei 3+ mitoses 2+) 1.7 cm size. No lymph invasion. Tumor cellularity 20%. [id number]. 07/13/2008: Left mastectomy: Residual ductal carcinoma in situ, high nuclear grade, deep margin negative ypTisNO(sn)MX. 10/25/2010: Abdominal and pelvic ultrasound: questionable lesion near gallbladder fossa recommend MRI follow up. 03/02/2011: Left and right frontal excisional brain biopsy: Adenocarcinoma, metastatic, NOS: ER +, PR 5% +, Her 2 NEG. 03/04/2011: [facility] testing of IHC4 residual recurrence risk score of 114 showing an 8 year recurrence reate of 57%. ER POSTIVE, PR POSITIVE, HER 2 POSTIVE (previously documented negative in 2009) cutoff of 361 patient scores 426. ki67 71%.  08/05/2013: Right breast FNA supraclavicular region: positive for metastatic carcinoma. ER/PR/HER2 not reported. 08/19/2014: Right cervical lymph node. Adenocarcinoma with features consistent with metastatic breast carcinoma. ER/PR/HER2 insufficient tissue. 09/04/2014: Repeat craniotomy, resection of right frontal tumor (metastatic breast cancer). ER 90-100% PR 51-60% HER2 - 09/29/2014: Right cervical lymph node, metastatic carcinoma c/w breast primary, ER 100% PR 20% HER2-  Imaging: 08/15/2007:  Bilateral Mammogram: Dense breasts with solid lesion at 6:00, 7:00, and 9:00 of right breast, solid lesion at 2:00 position left breast BIRADS 4B. 09/10/2007: Breast MRI: Three solid irregular enhancing lesions of the right breast 2.1 x 3.2, 2.8 x 2.7, and 1.8 x 1.6 cm. Mildly enlarged enhancing nodule right axilla. Left breast clumped enhancement midportion suspicious for malignancy. 03/01/2011: Brain MRI: At least two juxtacortical, intra-axial masses with extensive surrounding vasogenic edema most consistent with intracranial metastasis. 07/22/2013: Brain MRI +/- contrast: interval increase in  enhancing component on T2 FLAIR now measuring 1.0cm x 1.1 cm worrisome for progression of disease. 01/21/2014:  PET scan: Hypermetabolic small right supraclavicular and right upper paratracheal lymph nodes suspicious for mets. Hypermetabolic lymph node in the right paratracheal upper mediastinum that measure approximately 0.9 x 0.5cm. (of note no impressions made of areas noted on brain MRI 07/22/13).  01/21/2014: MRI Brain: Right frontal enhancing lesion has shown interval increase in size with surrounding edema and local mass effect (measured 1.8x2.1x1.6, previously 1.2x1.1x1.0) 07/16/2014: Brain MRI:  Right anterior frontal enhancing mass 2.5 x 2.0 cm compatible with a metastatic lesion has increased and changed in configuration from previous 2.0 x 1.8 cm, formerly multilobular. 08/06/2014: PET scan:  Multiple bilateral FDG avid low cervical, supraclavicular, upper mediastinal, and right internal mammary lymphadenopathy, likely representing metastatic disease. A cervical node in the right lower neck should be amenable to percutaneous sampling.  12/2014: Head MRI:  Evolution of right frontal postoperative changes status post tumor resection at the vertex. Expected postoperative changes. Minimal marginal enhancement resection bed with some adjacent posterior intrinsic T1 signal again seen. Decreasing T2/FLAIR adjacent parenchymal changes.  Stable resection cavity is left posterior frontal and right parieto-occipital regions.  Labs:  Appointment on 09/20/2015  Component Date Value Ref Range Status  . WBC 09/20/2015 6.4  3.6 - 11.0 K/uL Final  . RBC 09/20/2015 4.20  3.80 - 5.20 MIL/uL Final  . Hemoglobin 09/20/2015 11.8* 12.0 - 16.0 g/dL Final  . HCT 09/20/2015 35.8  35.0 - 47.0 % Final  . MCV 09/20/2015 85.2  80.0 - 100.0 fL Final  . MCH 09/20/2015 28.1  26.0 - 34.0 pg Final  . MCHC 09/20/2015 33.0  32.0 - 36.0 g/dL Final  . RDW 09/20/2015 15.0* 11.5 - 14.5 % Final  . Platelets 09/20/2015 279  150 -  440 K/uL Final  . Neutrophils Relative % 09/20/2015 71   Final  . Neutro Abs 09/20/2015 4.6  1.4 - 6.5 K/uL Final  . Lymphocytes Relative 09/20/2015 10   Final  . Lymphs Abs 09/20/2015 0.6* 1.0 - 3.6 K/uL Final  . Monocytes Relative 09/20/2015 10   Final  . Monocytes Absolute 09/20/2015 0.6  0.2 - 0.9 K/uL Final  . Eosinophils Relative 09/20/2015 8   Final  . Eosinophils Absolute 09/20/2015 0.5  0 - 0.7 K/uL Final  . Basophils Relative 09/20/2015 1   Final  . Basophils Absolute 09/20/2015 0.1  0 - 0.1 K/uL Final  . Sodium 09/20/2015 138  135 - 145 mmol/L Final  . Potassium 09/20/2015 3.6  3.5 - 5.1 mmol/L Final  . Chloride 09/20/2015 107  101 - 111 mmol/L Final  . CO2 09/20/2015 25  22 - 32 mmol/L Final  . Glucose, Bld 09/20/2015 86  65 - 99 mg/dL Final  . BUN 09/20/2015 12  6 - 20 mg/dL Final  . Creatinine, Ser 09/20/2015 0.51  0.44 - 1.00 mg/dL Final  . Calcium 09/20/2015 8.8* 8.9 - 10.3 mg/dL Final  .  Total Protein 09/20/2015 7.5  6.5 - 8.1 g/dL Final  . Albumin 09/20/2015 3.6  3.5 - 5.0 g/dL Final  . AST 09/20/2015 24  15 - 41 U/L Final  . ALT 09/20/2015 17  14 - 54 U/L Final  . Alkaline Phosphatase 09/20/2015 80  38 - 126 U/L Final  . Total Bilirubin 09/20/2015 0.4  0.3 - 1.2 mg/dL Final  . GFR calc non Af Amer 09/20/2015 >60  >60 mL/min Final  . GFR calc Af Amer 09/20/2015 >60  >60 mL/min Final   Comment: (NOTE) The eGFR has been calculated using the CKD EPI equation. This calculation has not been validated in all clinical situations. eGFR's persistently <60 mL/min signify possible Chronic Kidney Disease.   . Anion gap 09/20/2015 6  5 - 15 Final    Assessment:  Thressa Shiffer [name] is a 45 y.o. 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  She initially presented in 2009 while living in [place] with multi-focal right breast cancer with positive lymph node(s) which was ER+, PR+(low), and HER2/neu+. She received neoadjuvant chemotherapy (AC x 4 every 3  weeks followed by Taxol/Abraxane + carboplatin weekly x 12) without anti-HER2 treatment.    On 07/14/2008, she underwent bilateral mastectomies followed by reconstruction.  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   Zero of 11 lymph nodes on the right were positive for malignancy. 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 Deep margin was negative. Zero of 2 lymph nodes were positive for metastatic disease.  She received adjuvant radiation to the right breast.  She received adjuvant tamoxifen and Zoladex.  She could not tolerate symptoms of joint pain and tamoxifen was discontinued after 1-2 months.  Zoladex was continued for approximately 1.5 years.  She was diagnosed with brain metastases in 02/2011.  Pathology revealed ER+, PR+(low), and HER2/neu-.  She underwent resection followed by [facility].  On 03/09/2011, original breast tissue (09/03/2007) sent to [facility] testing.  Testing revealed ER+, PR+(borderline), and HER2/neu positive (different than initial testing).  She received Herceptin for 1 year beginning in 2013.  She then began Tykerb and Xeloda after completion of Herceptin (2014).  She was on extremely low, and probably sub-therapeutic, dosing of lapatinib + capecitabine.   She developed right supraclavicular adenopathy in 2015. She was noted to have slow disease progression in the right frontal/parietal lesion with minimal presence of systemic disease on PET scans. Capecitabine + lapatinib were discontinued in 04/2014.   She underwent repeat craniotomy with resection of brain metastasis on 09/04/2014.  Biopsy of the right supraclavicular lymph node  was ER+,PR+,HER2/neu-.  She started tamoxifen in 09/2014, but discontinued it secondary to pain in hip, back, and shoulder.  PET scan on 04/20/2015 revealed cervical and thoracic nodal metastasis.  There was multifocal osseous metastasis (left transverse T4 process, left humeral head,  right iliac wing, and left side of the sacrum).  Incidental findings included right nephrolithiasis.    CA27.29 was 177.8 on 04/16/2015, 276.4 on 05/28/2015, 287.5 on 07/13/2015, 333.8 on 07/26/2015, 412.9 on 08/23/2015, and 315.6 on 09/20/2015.  Bone scan on 04/22/2015 corresponded with PET-CT with metastatic lesions evident in the left humeral head, left posterior aspect of T4, left aspect of S1, and the right iliac crest.  There was no other definite foci of metastatic disease to bone.  Uptake in the skull was most suggestive of the previous right frontal craniotomy.  Bone density study on 05/24/2015  revealed a T-score of -0.8 in the right femoral neck (normal).  She restarted tamoxifen on 04/23/2015.  Tamoxifen was discontinued on 08/23/2015 secondary to progressive disease.  She received Zoladex on 04/30/2015.  She underwent laparoscopic bilateral oophorectomy on 05/18/2015.  She receives monthly Xgeva (began 04/30/2015; last 08/23/2015).  She was diagnosed with a Proteus mirabilis UTI on 07/13/2015 and was treated with a course of ciprofloxacin.  Head MRI on 07/07/2015 revealed metastatic breast cancer with 3 resection cavities. The anterior right frontal cavity was positive for nodular marginal enhancement, new from brain MRI report 01/07/2015, and consistent with recurrent disease.  She received 30 Gy from 08/05/2015 - 08/19/2015 to the CNS.  Lumbar spine MRI on 08/12/2015 revealed osseous metastatic disease at L3, L4, L5, S1, and in the left iliac bone posteriorly. There was no definite epidural metastatic disease.  PET scan on 08/20/2015 revealed a response to therapy in the lower cervical and thoracic adenopathy.  There was a mixed response to multifocal osseous metastases (some new lesions).   She began Faslodex on 08/23/2015.  Patient received palliative radiation 30 Gy to the left humerus and lumbar spine from 08/25/2015 - 09/09/2015  Symptomatically, she feels better.  She denies  any pain.  She has a lumbar spine rash secondary to radiation.  She has mild hypocalcemia.  Plan: 1.  Labs today:  CBC with diff, CMP, CA27.29. 2.  Discuss initiation of Ibrance.  Side effects reviewed.  Patient consented to treatment. 3.  Faslodex today. 4.  Xgeva today. 5.  Discuss mild hypocalcemia.  Discuss initiation of calcium 1200 mg a day with vitamin D. 6.  Obtain radiation summary- done. 7.  RTC in 2 weeks for MD assess, labs (CBC with diff, CMP) to assess tolerance of Ibrance.  8.  RTC in 4 weeks for MD assess, labs (CBC with diff, CMP, Mg, CA27.29),.Faslodex and Xgeva.   Lequita Asal, MD  09/20/2015, 2:18 PM

## 2015-09-21 LAB — CANCER ANTIGEN 27.29: CA 27.29: 315.6 U/mL — ABNORMAL HIGH (ref 0.0–38.6)

## 2015-09-22 ENCOUNTER — Telehealth: Payer: Self-pay

## 2015-09-22 ENCOUNTER — Encounter: Payer: Self-pay | Admitting: Hematology and Oncology

## 2015-09-22 NOTE — Telephone Encounter (Signed)
Pt called and reported to pt per MD and labs Ca 27.29 had decreased from last blood draw.  Pt verbalized gratitude for calling.

## 2015-10-05 ENCOUNTER — Inpatient Hospital Stay (HOSPITAL_BASED_OUTPATIENT_CLINIC_OR_DEPARTMENT_OTHER): Payer: Medicare HMO | Admitting: Hematology and Oncology

## 2015-10-05 ENCOUNTER — Inpatient Hospital Stay: Payer: Medicare HMO

## 2015-10-05 ENCOUNTER — Encounter: Payer: Self-pay | Admitting: Hematology and Oncology

## 2015-10-05 VITALS — BP 111/80 | HR 88 | Temp 99.1°F | Resp 18 | Wt 154.8 lb

## 2015-10-05 DIAGNOSIS — C7951 Secondary malignant neoplasm of bone: Secondary | ICD-10-CM

## 2015-10-05 DIAGNOSIS — Z923 Personal history of irradiation: Secondary | ICD-10-CM

## 2015-10-05 DIAGNOSIS — Z79818 Long term (current) use of other agents affecting estrogen receptors and estrogen levels: Secondary | ICD-10-CM

## 2015-10-05 DIAGNOSIS — T451X5A Adverse effect of antineoplastic and immunosuppressive drugs, initial encounter: Secondary | ICD-10-CM

## 2015-10-05 DIAGNOSIS — R21 Rash and other nonspecific skin eruption: Secondary | ICD-10-CM

## 2015-10-05 DIAGNOSIS — C50911 Malignant neoplasm of unspecified site of right female breast: Secondary | ICD-10-CM

## 2015-10-05 DIAGNOSIS — Z9221 Personal history of antineoplastic chemotherapy: Secondary | ICD-10-CM

## 2015-10-05 DIAGNOSIS — C7931 Secondary malignant neoplasm of brain: Secondary | ICD-10-CM

## 2015-10-05 DIAGNOSIS — Z79899 Other long term (current) drug therapy: Secondary | ICD-10-CM

## 2015-10-05 DIAGNOSIS — R300 Dysuria: Secondary | ICD-10-CM

## 2015-10-05 DIAGNOSIS — Z170 Estrogen receptor positive status [ER+]: Secondary | ICD-10-CM

## 2015-10-05 DIAGNOSIS — D701 Agranulocytosis secondary to cancer chemotherapy: Secondary | ICD-10-CM

## 2015-10-05 LAB — CBC WITH DIFFERENTIAL/PLATELET
Basophils Absolute: 0 10*3/uL (ref 0–0.1)
Basophils Relative: 1 %
Eosinophils Absolute: 0.2 10*3/uL (ref 0–0.7)
Eosinophils Relative: 6 %
HCT: 36.4 % (ref 35.0–47.0)
Hemoglobin: 12.2 g/dL (ref 12.0–16.0)
Lymphocytes Relative: 24 %
Lymphs Abs: 0.6 10*3/uL — ABNORMAL LOW (ref 1.0–3.6)
MCH: 28.9 pg (ref 26.0–34.0)
MCHC: 33.6 g/dL (ref 32.0–36.0)
MCV: 86.2 fL (ref 80.0–100.0)
Monocytes Absolute: 0.1 10*3/uL — ABNORMAL LOW (ref 0.2–0.9)
Monocytes Relative: 3 %
Neutro Abs: 1.7 10*3/uL (ref 1.4–6.5)
Neutrophils Relative %: 66 %
Platelets: 248 10*3/uL (ref 150–400)
RBC: 4.23 MIL/uL (ref 3.80–5.20)
RDW: 15.1 % — ABNORMAL HIGH (ref 11.5–14.5)
WBC: 2.5 10*3/uL — ABNORMAL LOW (ref 3.6–11.0)

## 2015-10-05 LAB — URINALYSIS COMPLETE WITH MICROSCOPIC (ARMC ONLY)
Bacteria, UA: NONE SEEN
Bilirubin Urine: NEGATIVE
Glucose, UA: NEGATIVE mg/dL
Hgb urine dipstick: NEGATIVE
Ketones, ur: NEGATIVE mg/dL
Leukocytes, UA: NEGATIVE
Nitrite: NEGATIVE
Protein, ur: NEGATIVE mg/dL
RBC / HPF: NONE SEEN RBC/hpf (ref 0–5)
Specific Gravity, Urine: 1.005 (ref 1.005–1.030)
pH: 6 (ref 5.0–8.0)

## 2015-10-05 LAB — COMPREHENSIVE METABOLIC PANEL
ALT: 11 U/L — ABNORMAL LOW (ref 14–54)
AST: 21 U/L (ref 15–41)
Albumin: 4 g/dL (ref 3.5–5.0)
Alkaline Phosphatase: 88 U/L (ref 38–126)
Anion gap: 6 (ref 5–15)
BUN: 6 mg/dL (ref 6–20)
CO2: 24 mmol/L (ref 22–32)
Calcium: 8.5 mg/dL — ABNORMAL LOW (ref 8.9–10.3)
Chloride: 108 mmol/L (ref 101–111)
Creatinine, Ser: 0.64 mg/dL (ref 0.44–1.00)
GFR calc Af Amer: >60 mL/min (ref >60)
GFR calc non Af Amer: >60 mL/min (ref >60)
Glucose, Bld: 106 mg/dL — ABNORMAL HIGH (ref 65–99)
Potassium: 3.7 mmol/L (ref 3.5–5.1)
Sodium: 138 mmol/L (ref 135–145)
Total Bilirubin: 0.6 mg/dL (ref 0.3–1.2)
Total Protein: 7.9 g/dL (ref 6.5–8.1)

## 2015-10-05 NOTE — Progress Notes (Signed)
[facility] day:  10/05/2015   Chief Complaint: Melinda Tanner is a 45 y.o. 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   HPI:  The patient was last seen in the medical oncology clinic on 09/20/2015.  At that time, she denied any pain.  She had a lumbar spine rash secondary to radiation.  She had mild hypocalcemia.  Oral calcium was increased.  She received Faslodex and Xgeva.  She began [place] on 09/20/2015.  During the interim, she has done well.  She notes some itchy hands and ears associated with some fine little bumps. She also notes a itchy perirectal area.  She notes some burning with urination.`   Past Medical History  Diagnosis Date  . Breast cancer ([place]) 2009  . Seizures ([place])   . Brain cancer ([place]) 2012    Met. from Breast  . Complication of anesthesia     nausea, "drops in potassium and magnesium"    Past Surgical History  Procedure Laterality Date  . Mastectomy Bilateral 2010  . Brain surgery  2012, 2106  . Cesarean section    . Laparoscopic bilateral salpingo oopherectomy Bilateral 05/18/2015    Procedure: LAPAROSCOPIC BILATERAL SALPINGO OOPHORECTOMY;  Surgeon: Will Bonnet, MD;  Location: [facility] ORS;  Service: Gynecology;  Laterality: Bilateral;    Family History  Problem Relation Age of Onset  . Cancer Paternal Aunt   . Cancer Paternal Uncle   . Cancer Paternal Grandfather   . Hypertension Brother   . Diabetes Paternal Grandmother   A paternal aunt had breast cancer age 79, a paternal uncle had prostate cancer, and a paternal grandfather had leukemia.  Social History:  reports that she has never smoked. She does not have any smokeless tobacco history on file. She reports that she does not drink alcohol or use illicit drugs.  She is from [place].  She moved to [place] in 2015.  She moved to [place] in  04/2014.  She recently moved into the [place] area.  She has 2 children (boy and girl).  Her family will likely be moving to [facility] in December.  They are looking for a home.  The patient is accompanied by the Spanish interpreter today.  Allergies:  Allergies  Allergen Reactions  . Taxol [Paclitaxel] Anaphylaxis  . Aspirin Swelling  . Penicillins Swelling  . Zofran [Ondansetron Hcl] Nausea And Vomiting    Current Medications: Current Outpatient Prescriptions  Medication Sig Dispense Refill  . dexamethasone (DECADRON) 2 MG tablet Take 1 tablet (2 mg total) by mouth daily. Take 1 tablet daily x 1 week and then take 1 tablet every other day for 1 week , then stop taking 11 tablet 0  . omeprazole (PRILOSEC) 20 MG capsule Take 20 mg by mouth daily.  2  . oxycodone (OXY-IR) 5 MG capsule Take 1 capsule (5 mg total) by mouth every 4 (four) hours as needed. 30 capsule 0  . palbociclib (IBRANCE) 125 MG capsule Take 1 capsule daily for 21 days and then take 1 week off and repeat cycle again (Take whole with food.) 21 capsule 3  . pantoprazole (PROTONIX) 20 MG tablet TK 1 T PO D  3  . potassium chloride SA (K-DUR,KLOR-CON) 20 MEQ tablet Take 1 tablet (20 mEq total) by mouth 2 (two) times daily. for 1 days then 1 pill a day  x 2 days. 10 tablet 0   No current facility-administered medications for this visit.    Review of Systems:  GENERAL:  Feels good.  No fevers or sweats.  Weight up 3 pounds. PERFORMANCE STATUS (ECOG):  1 HEENT:  No visual changes, runny nose, sore throat, mouth sores or tenderness. Lungs: No shortness of breath or cough.  No hemoptysis. Cardiac:  No chest pain, palpitations, orthopnea, or PND. GI:  Appetite good.  No nausea, vomiting, diarrhea, constipation, melena or hematochezia. GU:  Burning with urination.  No urgency, frequency, or hematuria. Musculoskeletal:  Pain in left upper arm, resolved.  Back pain, resolved.  No muscle tenderness. Extremities:  No pain or  swelling. Skin:  Itchy hand and ears. Fine little bumps.  Itchy perirectal area. Neuro:  No headache, numbness or weakness, balance or coordination issues.  Endocrine:  No diabetes, thyroid issues, hot flashes or night sweats. Psych:  No mood changes, depression or anxiety. Pain:  No focal pain. Review of systems:  All other systems reviewed and found to be negative.  Physical Exam: Blood pressure 111/80, pulse 88, temperature 99.1 F (37.3 C), temperature source Tympanic, resp. rate 18, weight 154 lb 12.2 oz (70.2 kg), last menstrual period 03/19/2015. GENERAL:  Well developed, well nourished, woman sitting comfortably in the exam room in no acute distress.  MENTAL STATUS:  Alert and oriented to person, place and time. HEAD: Wearing a pink and gray head wrap.  Head shaved.  Normocephalic, atraumatic, face symmetric, no Cushingoid features. EYES:  Brown eyes. Pupils equal round and reactive to light and accomodation.  No conjunctivitis or scleral icterus. ENT:  Oropharynx clear without lesion.  Tongue normal. Mucous membranes moist.  RESPIRATORY:  Clear to auscultation without rales, wheezes or rhonchi. CARDIOVASCULAR:  Regular rate and rhythm without murmur, rub or gallop. ABDOMEN:  Soft, non-tender, with active bowel sounds, and no hepatosplenomegaly.  No masses. RECTAL:  Patient declines evaluation. SKIN:  Lumbar spine with hyperpigmentation and associated rash s/p XRT.  No vesicles.  No rash evident on hands. EXTREMITIES: No edema, no skin discoloration or tenderness.  No palpable cords. LYMPH NODES: 1 cm low right cervical node (stable).  No palpable supraclavicular, axillary or inguinal adenopathy  NEUROLOGICAL:  Appropriate. PSYCH:  Appropriate.     Pathology: 09/03/2007: Right breast core biopsy: infiltrating duct cell carcinoma high grade ER/PR (reportedly positive) Her2 (?) 10/02/2007: Right axillary node core biopsy: Fragment with metastatic carcinoma 10/23/2007: Left breast  core biopsy: DCIS high grade ER/PR (?) 07/13/2008: Right mastectomy: Invasive ductal carcinoma Grade III Nottingham score = tubules 3+ Nuclei 3+ mitoses 2+) 1.7 cm size. No lymph invasion. Tumor cellularity 20%. [id number]. 07/13/2008: Left mastectomy: Residual ductal carcinoma in situ, high nuclear grade, deep margin negative ypTisNO(sn)MX. 10/25/2010: Abdominal and pelvic ultrasound: questionable lesion near gallbladder fossa recommend MRI follow up. 03/02/2011: Left and right frontal excisional brain biopsy: Adenocarcinoma, metastatic, NOS: ER +, PR 5% +, Her 2 NEG. 03/04/2011: [facility] testing of IHC4 residual recurrence risk score of 114 showing an 8 year recurrence reate of 57%. ER POSTIVE, PR POSITIVE, HER 2 POSTIVE (previously documented negative in 2009) cutoff of 361 patient scores 426. ki67 71%.  08/05/2013: Right breast FNA supraclavicular region: positive for metastatic carcinoma. ER/PR/HER2 not reported. 08/19/2014: Right cervical lymph node. Adenocarcinoma with features consistent with metastatic breast carcinoma. ER/PR/HER2 insufficient tissue. 09/04/2014: Repeat craniotomy, resection of right frontal tumor (metastatic breast cancer). ER 90-100% PR 51-60% HER2 - 09/29/2014: Right cervical lymph node, metastatic carcinoma c/w breast  primary, ER 100% PR 20% HER2-  Imaging: 08/15/2007: Bilateral Mammogram: Dense breasts with solid lesion at 6:00, 7:00, and 9:00 of right breast, solid lesion at 2:00 position left breast BIRADS 4B. 09/10/2007: Breast MRI: Three solid irregular enhancing lesions of the right breast 2.1 x 3.2, 2.8 x 2.7, and 1.8 x 1.6 cm. Mildly enlarged enhancing nodule right axilla. Left breast clumped enhancement midportion suspicious for malignancy. 03/01/2011: Brain MRI: At least two juxtacortical, intra-axial masses with extensive surrounding vasogenic edema most consistent with intracranial metastasis. 07/22/2013: Brain MRI +/- contrast: interval increase in enhancing  component on T2 FLAIR now measuring 1.0cm x 1.1 cm worrisome for progression of disease. 01/21/2014:  PET scan: Hypermetabolic small right supraclavicular and right upper paratracheal lymph nodes suspicious for mets. Hypermetabolic lymph node in the right paratracheal upper mediastinum that measure approximately 0.9 x 0.5cm. (of note no impressions made of areas noted on brain MRI 07/22/13).  01/21/2014: MRI Brain: Right frontal enhancing lesion has shown interval increase in size with surrounding edema and local mass effect (measured 1.8x2.1x1.6, previously 1.2x1.1x1.0) 07/16/2014: Brain MRI:  Right anterior frontal enhancing mass 2.5 x 2.0 cm compatible with a metastatic lesion has increased and changed in configuration from previous 2.0 x 1.8 cm, formerly multilobular. 08/06/2014: PET scan:  Multiple bilateral FDG avid low cervical, supraclavicular, upper mediastinal, and right internal mammary lymphadenopathy, likely representing metastatic disease. A cervical node in the right lower neck should be amenable to percutaneous sampling.  12/2014: Head MRI:  Evolution of right frontal postoperative changes status post tumor resection at the vertex. Expected postoperative changes. Minimal marginal enhancement resection bed with some adjacent posterior intrinsic T1 signal again seen. Decreasing T2/FLAIR adjacent parenchymal changes.  Stable resection cavity is left posterior frontal and right parieto-occipital regions.  Labs:  Appointment on 10/05/2015  Component Date Value Ref Range Status  . WBC 10/05/2015 2.5* 3.6 - 11.0 K/uL Final  . RBC 10/05/2015 4.23  3.80 - 5.20 MIL/uL Final  . Hemoglobin 10/05/2015 12.2  12.0 - 16.0 g/dL Final  . HCT 10/05/2015 36.4  35.0 - 47.0 % Final  . MCV 10/05/2015 86.2  80.0 - 100.0 fL Final  . MCH 10/05/2015 28.9  26.0 - 34.0 pg Final  . MCHC 10/05/2015 33.6  32.0 - 36.0 g/dL Final  . RDW 10/05/2015 15.1* 11.5 - 14.5 % Final  . Platelets 10/05/2015 248  150 - 400 K/uL  Final   PLATELET CLUMPS NOTED ON SMEAR, COUNT APPEARS ADEQUATE  . Neutrophils Relative % 10/05/2015 66%   Final  . Neutro Abs 10/05/2015 1.7  1.4 - 6.5 K/uL Final  . Lymphocytes Relative 10/05/2015 24%   Final  . Lymphs Abs 10/05/2015 0.6* 1.0 - 3.6 K/uL Final  . Monocytes Relative 10/05/2015 3%   Final  . Monocytes Absolute 10/05/2015 0.1* 0.2 - 0.9 K/uL Final  . Eosinophils Relative 10/05/2015 6%   Final  . Eosinophils Absolute 10/05/2015 0.2  0 - 0.7 K/uL Final  . Basophils Relative 10/05/2015 1%   Final  . Basophils Absolute 10/05/2015 0.0  0 - 0.1 K/uL Final  . Sodium 10/05/2015 138  135 - 145 mmol/L Final  . Potassium 10/05/2015 3.7  3.5 - 5.1 mmol/L Final  . Chloride 10/05/2015 108  101 - 111 mmol/L Final  . CO2 10/05/2015 24  22 - 32 mmol/L Final  . Glucose, Bld 10/05/2015 106* 65 - 99 mg/dL Final  . BUN 10/05/2015 6  6 - 20 mg/dL Final  . Creatinine, Ser 10/05/2015 0.64  0.44 - 1.00 mg/dL Final  . Calcium 10/05/2015 8.5* 8.9 - 10.3 mg/dL Final  . Total Protein 10/05/2015 7.9  6.5 - 8.1 g/dL Final  . Albumin 10/05/2015 4.0  3.5 - 5.0 g/dL Final  . AST 10/05/2015 21  15 - 41 U/L Final  . ALT 10/05/2015 11* 14 - 54 U/L Final  . Alkaline Phosphatase 10/05/2015 88  38 - 126 U/L Final  . Total Bilirubin 10/05/2015 0.6  0.3 - 1.2 mg/dL Final  . GFR calc non Af Amer 10/05/2015 >60  >60 mL/min Final  . GFR calc Af Amer 10/05/2015 >60  >60 mL/min Final   Comment: (NOTE) The eGFR has been calculated using the CKD EPI equation. This calculation has not been validated in all clinical situations. eGFR's persistently <60 mL/min signify possible Chronic Kidney Disease.   . Anion gap 10/05/2015 6  5 - 15 Final    Assessment:  Melinda Tanner is a 45 y.o. 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  She initially presented in 2009 while living in [place] with multi-focal right breast cancer with positive lymph node(s) which was ER+, PR+(low), and HER2/neu+.  She received neoadjuvant chemotherapy (AC x 4 every 3 weeks followed by Taxol/Abraxane + carboplatin weekly x 12) without anti-HER2 treatment.    On 07/14/2008, she underwent bilateral mastectomies followed by reconstruction.  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   Zero of 11 lymph nodes on the right were positive for malignancy. 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 Deep margin was negative. Zero of 2 lymph nodes were positive for metastatic disease.  She received adjuvant radiation to the right breast.  She received adjuvant tamoxifen and Zoladex.  She could not tolerate symptoms of joint pain and tamoxifen was discontinued after 1-2 months.  Zoladex was continued for approximately 1.5 years.  She was diagnosed with brain metastases in 02/2011.  Pathology revealed ER+, PR+(low), and HER2/neu-.  She underwent resection followed by [facility].  On 03/09/2011, original breast tissue (09/03/2007) sent to [facility] testing.  Testing revealed ER+, PR+(borderline), and HER2/neu positive (different than initial testing).  She received Herceptin for 1 year beginning in 2013.  She then began [place]kerb and Xeloda after completion of Herceptin (2014).  She was on extremely low, and probably sub-therapeutic, dosing of lapatinib + capecitabine.   She developed right supraclavicular adenopathy in 2015. She was noted to have slow disease progression in the right frontal/parietal lesion with minimal presence of systemic disease on PET scans. Capecitabine + lapatinib were discontinued in 04/2014.   She underwent repeat craniotomy with resection of brain metastasis on 09/04/2014.  Biopsy of the right supraclavicular lymph node  was ER+,PR+,HER2/neu-.  She started tamoxifen in 09/2014, but discontinued it secondary to pain in hip, back, and shoulder.  PET scan on 04/20/2015 revealed cervical and thoracic nodal metastasis.  There was multifocal osseous  metastasis (left transverse T4 process, left humeral head, right iliac wing, and left side of the sacrum).  Incidental findings included right nephrolithiasis.    CA27.29 was 177.8 on 04/16/2015, 276.4 on 05/28/2015, 287.5 on 07/13/2015, 333.8 on 07/26/2015, 412.9 on 08/23/2015, and 315.6 on 09/20/2015.  Bone scan on 04/22/2015 corresponded with PET-CT with metastatic lesions evident in the left humeral head, left posterior aspect of T4, left aspect of S1, and the right iliac crest.  There was no other definite foci of metastatic disease to bone.  Uptake in the  skull was most suggestive of the previous right frontal craniotomy.  Bone density study on 05/24/2015 revealed a T-score of -0.8 in the right femoral neck (normal).  She restarted tamoxifen on 04/23/2015.  Tamoxifen was discontinued on 08/23/2015 secondary to progressive disease.  She received Zoladex on 04/30/2015.  She underwent laparoscopic bilateral oophorectomy on 05/18/2015.  She receives monthly Xgeva (began 04/30/2015; last 08/23/2015).  She was diagnosed with a Proteus mirabilis UTI on 07/13/2015 and was treated with a course of ciprofloxacin.  Head MRI on 07/07/2015 revealed metastatic breast cancer with 3 resection cavities. The anterior right frontal cavity was positive for nodular marginal enhancement, new from brain MRI report 01/07/2015, and consistent with recurrent disease.  She received 30 Gy from 08/05/2015 - 08/19/2015 to the CNS.  Lumbar spine MRI on 08/12/2015 revealed osseous metastatic disease at L3, L4, L5, S1, and in the left iliac bone posteriorly. There was no definite epidural metastatic disease.  PET scan on 08/20/2015 revealed a response to therapy in the lower cervical and thoracic adenopathy.  There was a mixed response to multifocal osseous metastases (some new lesions).   She began Faslodex on 08/23/2015.  She began [place] on 09/20/2015.  Patient received palliative radiation 30 Gy to the left humerus and  lumbar spine from 08/25/2015 - 09/09/2015  Symptomatically, she denies any pain.  She has a pruritic rash on her hands and ears.  She has dysuria.  She has mild leukopenia (WBC 2500; ANC 1500) and hypocalcemia (8.5).  Plan: 1.  Labs today:  CBC with diff, CMP. 2.  Continue Ibrance (21 days on/7 days off). 3.  Increase calcium supplementation. 4.  Review fever and neutropenia precautions. 5.  UA and culture 6.  RTC in 1 week for CBC with diff 7.  RTC on 10/18/2015 for MD assess, labs (CBC with diff, CMP, Mg, CA27.29), Faslodex and Xgeva.   Lequita Asal, MD  10/05/2015, 9:28 AM

## 2015-10-05 NOTE — Progress Notes (Signed)
Patient is here for follow up, mentions a rash on arms and palms of her hands started one week after starting new medication. She also mentions some dysuria but is drinking a lot of water to help.

## 2015-10-06 ENCOUNTER — Telehealth: Payer: Self-pay | Admitting: *Deleted

## 2015-10-06 DIAGNOSIS — R300 Dysuria: Secondary | ICD-10-CM

## 2015-10-06 LAB — URINE CULTURE

## 2015-10-06 NOTE — Telephone Encounter (Signed)
-----   Message from Lequita Asal, MD sent at 10/06/2015 12:22 PM EDT ----- Regarding: Needs repeat UA and culture  Urine culture was contaminated  Need to recollect  M ----- Message -----    From: Lab In [facility]: 10/05/2015   8:44 AM      To: Lequita Asal, MD

## 2015-10-06 NOTE — Telephone Encounter (Signed)
Called pt using translator line and informed pt of mixed bacteria suggested of contamination and new sample to get recollect. Pt agreeable to this and she will come tom.am and have new sample drawn

## 2015-10-07 ENCOUNTER — Inpatient Hospital Stay: Payer: Medicare HMO

## 2015-10-07 DIAGNOSIS — C50911 Malignant neoplasm of unspecified site of right female breast: Secondary | ICD-10-CM | POA: Diagnosis not present

## 2015-10-07 DIAGNOSIS — R300 Dysuria: Secondary | ICD-10-CM

## 2015-10-07 LAB — URINALYSIS COMPLETE WITH MICROSCOPIC (ARMC ONLY)
Bilirubin Urine: NEGATIVE
Glucose, UA: NEGATIVE mg/dL
Hgb urine dipstick: NEGATIVE
Leukocytes, UA: NEGATIVE
Nitrite: NEGATIVE
Protein, ur: NEGATIVE mg/dL
RBC / HPF: NONE SEEN RBC/hpf (ref 0–5)
Specific Gravity, Urine: 1.023 (ref 1.005–1.030)
pH: 6 (ref 5.0–8.0)

## 2015-10-08 LAB — URINE CULTURE

## 2015-10-11 ENCOUNTER — Ambulatory Visit: Payer: Medicare HMO | Admitting: Radiation Oncology

## 2015-10-11 ENCOUNTER — Inpatient Hospital Stay: Admission: RE | Admit: 2015-10-11 | Payer: Medicare HMO | Source: Ambulatory Visit | Admitting: Radiation Oncology

## 2015-10-12 ENCOUNTER — Inpatient Hospital Stay: Payer: Medicare HMO

## 2015-10-18 ENCOUNTER — Inpatient Hospital Stay: Payer: Medicare HMO

## 2015-10-18 ENCOUNTER — Inpatient Hospital Stay (HOSPITAL_BASED_OUTPATIENT_CLINIC_OR_DEPARTMENT_OTHER): Payer: Medicare HMO | Admitting: Hematology and Oncology

## 2015-10-18 ENCOUNTER — Other Ambulatory Visit: Payer: Self-pay | Admitting: *Deleted

## 2015-10-18 ENCOUNTER — Ambulatory Visit
Admission: RE | Admit: 2015-10-18 | Discharge: 2015-10-18 | Disposition: A | Discharge status: Home / Self Care | Payer: Medicare HMO | Source: Ambulatory Visit | Attending: Radiation Oncology | Admitting: Radiation Oncology

## 2015-10-18 ENCOUNTER — Other Ambulatory Visit: Payer: Self-pay | Admitting: Hematology and Oncology

## 2015-10-18 VITALS — BP 120/80 | HR 88 | Temp 98.3°F | Resp 20 | Wt 155.1 lb

## 2015-10-18 VITALS — BP 115/83 | HR 92 | Temp 98.5°F | Wt 155.2 lb

## 2015-10-18 DIAGNOSIS — C50911 Malignant neoplasm of unspecified site of right female breast: Secondary | ICD-10-CM | POA: Insufficient documentation

## 2015-10-18 DIAGNOSIS — Z923 Personal history of irradiation: Secondary | ICD-10-CM

## 2015-10-18 DIAGNOSIS — C7951 Secondary malignant neoplasm of bone: Secondary | ICD-10-CM

## 2015-10-18 DIAGNOSIS — D649 Anemia, unspecified: Secondary | ICD-10-CM | POA: Insufficient documentation

## 2015-10-18 DIAGNOSIS — Z79899 Other long term (current) drug therapy: Secondary | ICD-10-CM

## 2015-10-18 DIAGNOSIS — C7931 Secondary malignant neoplasm of brain: Secondary | ICD-10-CM | POA: Insufficient documentation

## 2015-10-18 DIAGNOSIS — C50919 Malignant neoplasm of unspecified site of unspecified female breast: Secondary | ICD-10-CM

## 2015-10-18 DIAGNOSIS — Z170 Estrogen receptor positive status [ER+]: Secondary | ICD-10-CM | POA: Diagnosis not present

## 2015-10-18 DIAGNOSIS — Z9221 Personal history of antineoplastic chemotherapy: Secondary | ICD-10-CM

## 2015-10-18 DIAGNOSIS — R21 Rash and other nonspecific skin eruption: Secondary | ICD-10-CM

## 2015-10-18 DIAGNOSIS — Z79818 Long term (current) use of other agents affecting estrogen receptors and estrogen levels: Secondary | ICD-10-CM

## 2015-10-18 LAB — COMPREHENSIVE METABOLIC PANEL
ALT: 10 U/L — ABNORMAL LOW (ref 14–54)
AST: 18 U/L (ref 15–41)
Albumin: 3.9 g/dL (ref 3.5–5.0)
Alkaline Phosphatase: 76 U/L (ref 38–126)
Anion gap: 7 (ref 5–15)
BUN: 12 mg/dL (ref 6–20)
CO2: 24 mmol/L (ref 22–32)
Calcium: 9 mg/dL (ref 8.9–10.3)
Chloride: 105 mmol/L (ref 101–111)
Creatinine, Ser: 0.5 mg/dL (ref 0.44–1.00)
GFR calc Af Amer: >60 mL/min (ref >60)
GFR calc non Af Amer: >60 mL/min (ref >60)
Glucose, Bld: 104 mg/dL — ABNORMAL HIGH (ref 65–99)
Potassium: 3.6 mmol/L (ref 3.5–5.1)
Sodium: 136 mmol/L (ref 135–145)
Total Bilirubin: 0.6 mg/dL (ref 0.3–1.2)
Total Protein: 7.4 g/dL (ref 6.5–8.1)

## 2015-10-18 LAB — CBC WITH DIFFERENTIAL/PLATELET
Basophils Absolute: 0.1 10*3/uL (ref 0–0.1)
Basophils Relative: 3 %
Eosinophils Absolute: 0 10*3/uL (ref 0–0.7)
Eosinophils Relative: 1 %
HCT: 29 % — ABNORMAL LOW (ref 35.0–47.0)
Hemoglobin: 10 g/dL — ABNORMAL LOW (ref 12.0–16.0)
Lymphocytes Relative: 25 %
Lymphs Abs: 0.5 10*3/uL — ABNORMAL LOW (ref 1.0–3.6)
MCH: 29.9 pg (ref 26.0–34.0)
MCHC: 34.4 g/dL (ref 32.0–36.0)
MCV: 87 fL (ref 80.0–100.0)
Monocytes Absolute: 0.3 10*3/uL (ref 0.2–0.9)
Monocytes Relative: 14 %
Neutro Abs: 1.3 10*3/uL — ABNORMAL LOW (ref 1.4–6.5)
Neutrophils Relative %: 57 %
Platelets: 275 10*3/uL (ref 150–440)
RBC: 3.33 MIL/uL — ABNORMAL LOW (ref 3.80–5.20)
RDW: 15.4 % — ABNORMAL HIGH (ref 11.5–14.5)
WBC: 2.2 10*3/uL — ABNORMAL LOW (ref 3.6–11.0)

## 2015-10-18 LAB — MAGNESIUM: Magnesium: 1.9 mg/dL (ref 1.7–2.4)

## 2015-10-18 MED ORDER — DENOSUMAB 120 MG/1.7ML ~~LOC~~ SOLN
120.0000 mg | Freq: Once | SUBCUTANEOUS | Status: AC
  Administered 2015-10-18: 120 mg via SUBCUTANEOUS
  Filled 2015-10-18: qty 1.7

## 2015-10-18 MED ORDER — FULVESTRANT 250 MG/5ML IM SOLN
500.0000 mg | INTRAMUSCULAR | Status: DC
  Administered 2015-10-18: 500 mg via INTRAMUSCULAR
  Filled 2015-10-18: qty 10

## 2015-10-18 NOTE — Progress Notes (Signed)
Patient seen as 1 month follow s/p RT to L-spine, left shoulder, and 2 months after RT to brain.  Primary Care Provider: Elyse Jarvis, MD Referring Physician: Nolon Stalls, MD  HPI: this is a 45 yo F with metastatic breast cancer who most recently completed radiation to 3 sites: 1. Left shoulder - 30 Gy/3f completed 09/09/15 2. L spine - 30 Gy/187fcompleted 09/09/15 3. Brain - frontal boost 30 Gy/10 fx completed 08/19/15 She continues on ibrance/faslodex and will see Dr. CoMike Gipater today. She denies headaches, has mild nausea, denies emesis. Has a sense of fullness in her shoulder and very mild pain in the lower spine. Also notes occasional tingling in her scalp.   SH: accompanied by husband and Spanish interpreter.  Exam:  Vitals:   10/18/15 1316  BP: 120/80  Pulse: 88  Resp: 20  Temp: 98.3 F (36.8 C)   Awake, alert, NAD PERRLA, EOMI, face symmetric, facial sensation symmetric. CN2-12 intact to direct exam. Finger to nose intact. Speech fluent. Gait normal. Strength 5/5 ab/adduction of shoulder and flexion/extension elbow, wrist, hip, knee, ankle. Scalp demonstrates hair loss c/w treatment fields. No desquamation or erythema. Shoulder with no skin changes. No pain to percussion/palpation. Spine shows no skin changes. No pain to mild percussion.  ASSESSMENT: 1. Doing well just over 1 month s/p completion of palliative RT to multiple sites.  PLAN: 1. F/u with radiation oncology PRN 2. F/u with medical oncology as scheduled. 3. patient knows she can call with any questions of concerns.   RaMallie DartingMD PhD

## 2015-10-18 NOTE — Progress Notes (Signed)
Patient is here for follow up, he husband is with her today.  She has not received her Ibrance. Patient had received a call from the company that the medication was expensive and had to send proof of income. They want to discuss this with you

## 2015-10-18 NOTE — Progress Notes (Signed)
[facility] day:  10/18/15   Chief Complaint: Melinda Tanner is a 45 y.o. 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   HPI:  The patient was last seen in the medical oncology clinic on 10/05/2015.  At that time, she was seen for assessment on day 16 of cycle #1 Ibrance.  Symptomatically, she had mild dysuria.  She had mild leukopenia (WBC 2500; ANC 1500) and hypocalcemia (8.5).  We discussed increased calcium supplementation.  She was to return in 1 week for follow-up CBC with diff.  No counts were performed.  During the interim, she has done well. She notes that she has not had pain in 2 1/2 months.  She has less itching at the site of her prior radiation.  She forgets to take her calcium.   Past Medical History:  Diagnosis Date  . Brain cancer ([place]) 2012   Met. from Breast  . Breast cancer ([place]) 2009  . Complication of anesthesia    nausea, "drops in potassium and magnesium"  . Seizures ([place])     Past Surgical History:  Procedure Laterality Date  . BRAIN SURGERY  2012, 2106  . CESAREAN SECTION    . LAPAROSCOPIC BILATERAL SALPINGO OOPHERECTOMY Bilateral 05/18/2015   Procedure: LAPAROSCOPIC BILATERAL SALPINGO OOPHORECTOMY;  Surgeon: Will Bonnet, MD;  Location: [facility] ORS;  Service: Gynecology;  Laterality: Bilateral;  . MASTECTOMY Bilateral 2010    Family History  Problem Relation Age of Onset  . Cancer Paternal Aunt   . Cancer Paternal Uncle   . Cancer Paternal Grandfather   . Hypertension Brother   . Diabetes Paternal Grandmother   A paternal aunt had breast cancer age 72, a paternal uncle had prostate cancer, and a paternal grandfather had leukemia.  Social History:  reports that she has never smoked. She does not have any smokeless tobacco history on file. She reports that she does not drink  alcohol or use drugs.  She is from [place].  She moved to [place] in 2015.  She moved to [place] in 04/2014.  She recently moved into the [place] area.  She has 2 children (boy and girl).  Her family will likely be moving to [facility] in December.  They are looking for a home.  The patient is accompanied by her husband and the Spanish interpreter today.  Allergies:  Allergies  Allergen Reactions  . Taxol [Paclitaxel] Anaphylaxis  . Aspirin Swelling  . Penicillins Swelling  . Zofran [Ondansetron Hcl] Nausea And Vomiting    Current Medications: Current Outpatient Prescriptions  Medication Sig Dispense Refill  . dexamethasone (DECADRON) 2 MG tablet Take 1 tablet (2 mg total) by mouth daily. Take 1 tablet daily x 1 week and then take 1 tablet every other day for 1 week , then stop taking 11 tablet 0  . omeprazole (PRILOSEC) 20 MG capsule Take 20 mg by mouth daily.  2  . oxycodone (OXY-IR) 5 MG capsule Take 1 capsule (5 mg total) by mouth every 4 (four) hours as needed. 30 capsule 0  . palbociclib (IBRANCE) 125 MG capsule Take 1 capsule daily for 21 days and then take 1 week off and repeat cycle again (Take whole with food.) 21 capsule 3  . pantoprazole (PROTONIX) 20 MG tablet TK 1 T PO D  3  . potassium chloride SA (K-DUR,KLOR-CON) 20  MEQ tablet Take 1 tablet (20 mEq total) by mouth 2 (two) times daily. for 1 days then 1 pill a day x 2 days. 10 tablet 0   No current facility-administered medications for this visit.     Review of Systems:  GENERAL:  Feels good.  No fevers or sweats.  Weight up 1 pound. PERFORMANCE STATUS (ECOG):  1 HEENT:  No visual changes, runny nose, sore throat, mouth sores or tenderness. Lungs: No shortness of breath or cough.  No hemoptysis. Cardiac:  No chest pain, palpitations, orthopnea, or PND. GI:  Appetite good.  No nausea, vomiting, diarrhea, constipation, melena or hematochezia. GU:  No urgency, frequency, dysuria or  hematuria. Musculoskeletal: No none or joint pain.  No muscle tenderness. Extremities:  No pain or swelling. Skin:  Less itch at site of prior radiation. Neuro:  No headache, numbness or weakness, balance or coordination issues.  Endocrine:  No diabetes, thyroid issues, hot flashes or night sweats. Psych:  No mood changes, depression or anxiety. Pain:  No focal pain. Review of systems:  All other systems reviewed and found to be negative.  Physical Exam: Blood pressure 115/83, pulse 92, temperature 98.5 F (36.9 C), temperature source Tympanic, weight 155 lb 3.3 oz (70.4 kg), last menstrual period 03/19/2015. GENERAL:  Well developed, well nourished, woman sitting comfortably in the exam room in no acute distress.  MENTAL STATUS:  Alert and oriented to person, place and time. HEAD: Wearing scarf.  Normocephalic, atraumatic, face symmetric, no Cushingoid features. EYES:  Brown eyes. Pupils equal round and reactive to light and accomodation.  No conjunctivitis or scleral icterus. ENT:  Oropharynx clear without lesion.  Tongue normal. Mucous membranes moist.  RESPIRATORY:  Clear to auscultation without rales, wheezes or rhonchi. CARDIOVASCULAR:  Regular rate and rhythm without murmur, rub or gallop. ABDOMEN:  Soft, non-tender, with active bowel sounds, and no hepatosplenomegaly.  No masses. SKIN:  Lumbar spine with hyperpigmentation and associated rash s/p XRT.  No vesicles.  EXTREMITIES: No edema, no skin discoloration or tenderness.  No palpable cords. LYMPH NODES: < 1 cm low right cervical node (smaller).  No palpable supraclavicular, axillary or inguinal adenopathy  NEUROLOGICAL:  Appropriate. PSYCH:  Appropriate.     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 No lymph invasion. Tumor cellularity 20%. [id number]. 07/13/2008: Left mastectomy: Residual ductal carcinoma in situ, high nuclear grade, deep margin negative ypTisNO(sn)MX. 10/25/2010: Abdominal and pelvic ultrasound: questionable lesion near gallbladder fossa recommend MRI follow up. 03/02/2011: Left and right frontal excisional brain biopsy: Adenocarcinoma, metastatic, NOS: ER +, PR 5% +, Her 2 NEG. 03/04/2011: [facility] testing of IHC4 residual recurrence risk score of 114 showing an 8 year recurrence reate of 57%. ER POSTIVE, PR POSITIVE, HER 2 POSTIVE (previously documented negative in 2009) cutoff of 361 patient scores 426. ki67 71%.  08/05/2013: Right breast FNA supraclavicular region: positive for metastatic carcinoma. ER/PR/HER2 not reported. 08/19/2014: Right cervical lymph node. Adenocarcinoma with features consistent with metastatic breast carcinoma. ER/PR/HER2 insufficient tissue. 09/04/2014: Repeat craniotomy, resection of right frontal tumor (metastatic breast cancer). ER 90-100% PR 51-60% HER2 - 09/29/2014: Right cervical lymph node, metastatic carcinoma c/w breast primary, ER 100% PR 20% HER2-  Imaging: 08/15/2007: Bilateral  Mammogram: Dense breasts with solid lesion at 6:00, 7:00, and 9:00 of right breast, solid lesion at 2:00 position left breast BIRADS 4B. 09/10/2007: Breast MRI: Three solid irregular enhancing lesions of the right breast 2.1 x 3.2, 2.8 x 2.7, and 1.8 x 1.6 cm. Mildly enlarged enhancing nodule right axilla. Left breast clumped enhancement midportion suspicious for malignancy. 03/01/2011: Brain MRI: At least two juxtacortical, intra-axial masses with extensive surrounding vasogenic edema most consistent with intracranial metastasis. 07/22/2013: Brain MRI +/- contrast: interval increase in enhancing component on T2 FLAIR now measuring  1.0cm x 1.1 cm worrisome for progression of disease. 01/21/2014:  PET scan: Hypermetabolic small right supraclavicular and right upper paratracheal lymph nodes suspicious for mets. Hypermetabolic lymph node in the right paratracheal upper mediastinum that measure approximately 0.9 x 0.5cm. (of note no impressions made of areas noted on brain MRI 07/22/13).  01/21/2014: MRI Brain: Right frontal enhancing lesion has shown interval increase in size with surrounding edema and local mass effect (measured 1.8x2.1x1.6, previously 1.2x1.1x1.0) 07/16/2014: Brain MRI:  Right anterior frontal enhancing mass 2.5 x 2.0 cm compatible with a metastatic lesion has increased and changed in configuration from previous 2.0 x 1.8 cm, formerly multilobular. 08/06/2014: PET scan:  Multiple bilateral FDG avid low cervical, supraclavicular, upper mediastinal, and right internal mammary lymphadenopathy, likely representing metastatic disease. A cervical node in the right lower neck should be amenable to percutaneous sampling.  12/2014: Head MRI:  Evolution of right frontal postoperative changes status post tumor resection at the vertex. Expected postoperative changes. Minimal marginal enhancement resection bed with some adjacent posterior intrinsic T1 signal again seen. Decreasing T2/FLAIR adjacent parenchymal changes.  Stable resection cavity is left posterior frontal and right parieto-occipital regions.  Labs:  Appointment on 10/18/2015  Component Date Value Ref Range Status  . WBC 10/18/2015 2.2* 3.6 - 11.0 K/uL Final  . RBC 10/18/2015 3.33* 3.80 - 5.20 MIL/uL Final  . Hemoglobin 10/18/2015 10.0* 12.0 - 16.0 g/dL Final  . HCT 10/18/2015 29.0* 35.0 - 47.0 % Final  . MCV 10/18/2015 87.0  80.0 - 100.0 fL Final  . MCH 10/18/2015 29.9  26.0 - 34.0 pg Final  . MCHC 10/18/2015 34.4  32.0 - 36.0 g/dL Final  . RDW 10/18/2015 15.4* 11.5 - 14.5 % Final  . Platelets 10/18/2015 275  150 - 440 K/uL Final  . Neutrophils Relative %  10/18/2015 57  % Final  . Neutro Abs 10/18/2015 1.3* 1.4 - 6.5 K/uL Final  . Lymphocytes Relative 10/18/2015 25  % Final  . Lymphs Abs 10/18/2015 0.5* 1.0 - 3.6 K/uL Final  . Monocytes Relative 10/18/2015 14  % Final  . Monocytes Absolute 10/18/2015 0.3  0.2 - 0.9 K/uL Final  . Eosinophils Relative 10/18/2015 1  % Final  . Eosinophils Absolute 10/18/2015 0.0  0 - 0.7 K/uL Final  . Basophils Relative 10/18/2015 3  % Final  . Basophils Absolute 10/18/2015 0.1  0 - 0.1 K/uL Final  . Sodium 10/18/2015 136  135 - 145 mmol/L Final  . Potassium 10/18/2015 3.6  3.5 - 5.1 mmol/L Final  . Chloride 10/18/2015 105  101 - 111 mmol/L Final  . CO2 10/18/2015 24  22 - 32 mmol/L Final  . Glucose, Bld 10/18/2015 104* 65 - 99 mg/dL Final  . BUN 10/18/2015 12  6 - 20 mg/dL Final  . Creatinine, Ser 10/18/2015 0.50  0.44 - 1.00 mg/dL Final  . Calcium 10/18/2015 9.0  8.9 - 10.3 mg/dL Final  . Total Protein 10/18/2015 7.4  6.5 - 8.1 g/dL Final  . Albumin 10/18/2015 3.9  3.5 - 5.0 g/dL Final  . AST 10/18/2015 18  15 - 41 U/L Final  . ALT 10/18/2015 10* 14 - 54 U/L Final  . Alkaline Phosphatase 10/18/2015 76  38 - 126 U/L Final  . Total Bilirubin 10/18/2015 0.6  0.3 - 1.2 mg/dL Final  . GFR calc non Af Amer 10/18/2015 >60  >60 mL/min Final  . GFR calc Af Amer 10/18/2015 >60  >60 mL/min Final   Comment: (NOTE) The eGFR has been calculated using the CKD EPI equation. This calculation has not been validated in all clinical situations. eGFR's persistently <60 mL/min signify possible Chronic Kidney Disease.   . Anion gap 10/18/2015 7  5 - 15 Final  . Magnesium 10/18/2015 1.9  1.7 - 2.4 mg/dL Final    Assessment:  Alexandria Shiflett [name] is a 45 y.o. 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  She initially presented in 2009 while living in [place] with multi-focal right breast cancer with positive lymph node(s) which was ER+, PR+(low), and HER2/neu+. She received neoadjuvant chemotherapy  (AC x 4 every 3 weeks followed by Taxol/Abraxane + carboplatin weekly x 12) without anti-HER2 treatment.    On 07/14/2008, she underwent bilateral mastectomies followed by reconstruction.  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   Zero of 11 lymph nodes on the right were positive for malignancy. 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 Deep margin was negative. Zero of 2 lymph nodes were positive for metastatic disease.  She received adjuvant radiation to the right breast.  She received adjuvant tamoxifen and Zoladex.  She could not tolerate symptoms of joint pain and tamoxifen was discontinued after 1-2 months.  Zoladex was continued for approximately 1.5 years.  She was diagnosed with brain metastases in 02/2011.  Pathology revealed ER+, PR+(low), and HER2/neu-.  She underwent resection followed by [facility].  On 03/09/2011, original breast tissue (09/03/2007) sent to [facility] testing.  Testing revealed ER+, PR+(borderline), and HER2/neu positive (different than initial testing).  She received Herceptin for 1 year beginning in 2013.  She then began Tykerb and Xeloda after completion of Herceptin (2014).  She was on extremely low, and probably sub-therapeutic, dosing of lapatinib + capecitabine.   She developed right supraclavicular adenopathy in 2015. She was noted to have slow disease progression in the right frontal/parietal lesion with minimal presence of systemic disease on PET scans. Capecitabine + lapatinib were discontinued in 04/2014.   She underwent repeat craniotomy with resection of brain metastasis on 09/04/2014.  Biopsy of the right supraclavicular lymph node  was ER+,PR+,HER2/neu-.  She started tamoxifen in 09/2014, but discontinued it secondary to pain in hip, back, and shoulder.  PET scan on 04/20/2015 revealed cervical and thoracic nodal metastasis.  There was multifocal osseous metastasis (left transverse T4 process,  left humeral head, right iliac wing, and left side of the sacrum).  Incidental findings included right nephrolithiasis.    CA27.29 was 177.8 on 04/16/2015, 276.4 on 05/28/2015, 287.5 on 07/13/2015, 333.8 on 07/26/2015, 412.9 on 08/23/2015, 315.6 on 09/20/2015, and 188.8 on 10/18/2015.  Bone scan on 04/22/2015 corresponded with PET-CT with metastatic lesions evident in the left humeral head, left posterior aspect of T4, left aspect of S1, and the right iliac crest.  There was no other definite foci of metastatic disease to bone.  Uptake in the skull was most suggestive of the previous right  frontal craniotomy.  Bone density study on 05/24/2015 revealed a T-score of -0.8 in the right femoral neck (normal).  She restarted tamoxifen on 04/23/2015.  Tamoxifen was discontinued on 08/23/2015 secondary to progressive disease.  She received Zoladex on 04/30/2015.  She underwent laparoscopic bilateral oophorectomy on 05/18/2015.  She receives monthly Xgeva (began 04/30/2015; last 09/20/2015).  She was diagnosed with a Proteus mirabilis UTI on 07/13/2015 and was treated with a course of ciprofloxacin.  Head MRI on 07/07/2015 revealed metastatic breast cancer with 3 resection cavities. The anterior right frontal cavity was positive for nodular marginal enhancement, new from brain MRI report 01/07/2015, and consistent with recurrent disease.  She received 30 Gy from 08/05/2015 - 08/19/2015 to the CNS.  Lumbar spine MRI on 08/12/2015 revealed osseous metastatic disease at L3, L4, L5, S1, and in the left iliac bone posteriorly. There was no definite epidural metastatic disease.  PET scan on 08/20/2015 revealed a response to therapy in the lower cervical and thoracic adenopathy.  There was a mixed response to multifocal osseous metastases (some new lesions).   She began Faslodex on 08/23/2015 (last 09/20/2015).  She completed cycle #1 Ibrance (09/20/2015 -10/10/2015).  Patient received palliative radiation 30 Gy  to the left humerus and lumbar spine from 08/25/2015 - 09/09/2015  She has a normocytic anemia likely due to treatment [facility], radiation).  Diet is good.  She denies any melena or hematochezia.  Symptomatically, she has had no pain in 2 1/2 months.  She has mild leukopenia (WBC 2200; ANC 1300).  Calcium is 9.0.  Plan: 1.  Labs today:  CBC with diff, CMP, Mg, CA27.29. 2.  Discuss status of Ibrance.  Cycle #2 not received secondary to approval contingency (await paperwork filled out by husband).  Discuss low counts today.  Anticipate waiting for count recovery before initiation of cycle #2.  If count recovery delayed, may need to decrease dose to 100 mg a day. 3.  Discuss anemia likely secondary to treatment.  Discuss labs at next visit. 4.  Faslodex today. 5.  Xgeva today. 6.  Discuss importance of taking calcium. 7.  RTC in 1 week for labs (CBC with diff, B12, folate, TSH, retic, ferritin, iron studies). 8.  RTC in 4 weeks for MD assessment, labs (CBC with diff, CMP, CA27.29), Xgeva and Faslodex.   Lequita Asal, MD  10/18/2015, 2:52 PM

## 2015-10-18 NOTE — Progress Notes (Signed)
Interpreter Jacqui present during visit.

## 2015-10-19 ENCOUNTER — Telehealth: Payer: Self-pay | Admitting: *Deleted

## 2015-10-19 LAB — CANCER ANTIGEN 27.29: CA 27.29: 188.8 U/mL — ABNORMAL HIGH (ref 0.0–38.6)

## 2015-10-19 NOTE — Telephone Encounter (Signed)
Pt and her husband were telling me that in the beginning she wa told she was approved for 1 year.  She got a call from [employer] asking for statement of husband income.  I called [employer] and got trf to pt copay asst and I left message and I called again today 11:30 and was sent to voicemail and left another message. Will await call back.  I have called the pt to update her about the status, her daughter got on the phone who speaks english and I told her as well as pt.

## 2015-10-19 NOTE — Telephone Encounter (Signed)
Called pt and let her know that pt tumor marker had came down from 300 to 188.  Dr. Mike Gip is pleased with results.  I have still not heard from [facility].

## 2015-10-19 NOTE — Telephone Encounter (Signed)
-----   Message from Lequita Asal, MD sent at 10/19/2015  1:13 PM EDT ----- Regarding: Happy call  Patient's tumor marker dramatically improved.  If counts have not recovered next week, then we will decrease her Ibrance dose.  M  ----- Message ----- From: Interface, Lab In [place] Sent: 10/18/2015   2:12 PM To: Lequita Asal, MD

## 2015-10-20 ENCOUNTER — Telehealth: Payer: Self-pay | Admitting: *Deleted

## 2015-10-20 NOTE — Telephone Encounter (Signed)
Patient and husband had spoke to me on Monday about they were told that copay asst was no longer covered and husband must send proof of income into their office to look into copay asst.  The understanding that pt and wife had that she was covered for a year for the med.  I have left several messages for [employer] over the last 2 days and no rtn call.  Today I spoke to [place] who trf me to [facility] and she states that pt had been denied for copay asst in 2016  Due to husband made too much money to qualify.  Then we sent in rx this year for ibrance and pt was approved for pt. asst but then in checking the records the copay fund realized she was denied 2016 and approved 2017 but they do not have proof of income for husband this time.  They have placed the copay ast on hold until they get proof  Of income and husband has been given fax # [phone] attn Anthony Sar and then they will determine if pt still meets and if she does not then they will lokk for other asst programs to try to help.  The sooner he sends it in the sooner they can get started with cont. Existing asst or looking for new asst.  I have spoke to husband and explained above and he will send the proof of income tom.at his work.

## 2015-10-24 ENCOUNTER — Encounter: Payer: Self-pay | Admitting: Hematology and Oncology

## 2015-10-25 ENCOUNTER — Inpatient Hospital Stay: Payer: Medicare HMO | Attending: Hematology and Oncology

## 2015-10-25 ENCOUNTER — Other Ambulatory Visit: Payer: Self-pay | Admitting: Hematology and Oncology

## 2015-10-25 ENCOUNTER — Encounter: Payer: Self-pay | Admitting: Hematology and Oncology

## 2015-10-25 DIAGNOSIS — C50911 Malignant neoplasm of unspecified site of right female breast: Secondary | ICD-10-CM | POA: Diagnosis not present

## 2015-10-25 DIAGNOSIS — Z79899 Other long term (current) drug therapy: Secondary | ICD-10-CM | POA: Diagnosis not present

## 2015-10-25 DIAGNOSIS — Z170 Estrogen receptor positive status [ER+]: Secondary | ICD-10-CM | POA: Diagnosis not present

## 2015-10-25 DIAGNOSIS — C7931 Secondary malignant neoplasm of brain: Secondary | ICD-10-CM

## 2015-10-25 DIAGNOSIS — Z79818 Long term (current) use of other agents affecting estrogen receptors and estrogen levels: Secondary | ICD-10-CM | POA: Diagnosis not present

## 2015-10-25 DIAGNOSIS — Z9221 Personal history of antineoplastic chemotherapy: Secondary | ICD-10-CM | POA: Insufficient documentation

## 2015-10-25 DIAGNOSIS — R21 Rash and other nonspecific skin eruption: Secondary | ICD-10-CM | POA: Diagnosis not present

## 2015-10-25 DIAGNOSIS — C50919 Malignant neoplasm of unspecified site of unspecified female breast: Secondary | ICD-10-CM

## 2015-10-25 DIAGNOSIS — C7951 Secondary malignant neoplasm of bone: Secondary | ICD-10-CM | POA: Diagnosis not present

## 2015-10-25 DIAGNOSIS — Z923 Personal history of irradiation: Secondary | ICD-10-CM | POA: Diagnosis not present

## 2015-10-25 DIAGNOSIS — E538 Deficiency of other specified B group vitamins: Secondary | ICD-10-CM | POA: Insufficient documentation

## 2015-10-25 LAB — CBC WITH DIFFERENTIAL/PLATELET
Basophils Absolute: 0.1 10*3/uL (ref 0–0.1)
Basophils Relative: 2 %
Eosinophils Absolute: 0.1 10*3/uL (ref 0–0.7)
Eosinophils Relative: 2 %
HCT: 29.5 % — ABNORMAL LOW (ref 35.0–47.0)
Hemoglobin: 10.3 g/dL — ABNORMAL LOW (ref 12.0–16.0)
Lymphocytes Relative: 19 %
Lymphs Abs: 0.8 10*3/uL — ABNORMAL LOW (ref 1.0–3.6)
MCH: 30.6 pg (ref 26.0–34.0)
MCHC: 34.9 g/dL (ref 32.0–36.0)
MCV: 87.8 fL (ref 80.0–100.0)
Monocytes Absolute: 0.5 10*3/uL (ref 0.2–0.9)
Monocytes Relative: 14 %
Neutro Abs: 2.5 10*3/uL (ref 1.4–6.5)
Neutrophils Relative %: 63 %
Platelets: 398 10*3/uL (ref 150–440)
RBC: 3.36 MIL/uL — ABNORMAL LOW (ref 3.80–5.20)
RDW: 20 % — ABNORMAL HIGH (ref 11.5–14.5)
WBC: 3.9 10*3/uL (ref 3.6–11.0)

## 2015-10-25 LAB — RETICULOCYTES
RBC.: 3.36 MIL/uL — ABNORMAL LOW (ref 3.80–5.20)
Retic Count, Absolute: 73.9 10*3/uL (ref 19.0–183.0)
Retic Ct Pct: 2.2 % (ref 0.4–3.1)

## 2015-10-25 LAB — FOLATE: Folate: 9.3 ng/mL (ref >5.9)

## 2015-10-25 LAB — VITAMIN B12: Vitamin B-12: 141 pg/mL — ABNORMAL LOW (ref 180–914)

## 2015-10-25 LAB — FERRITIN: Ferritin: 43 ng/mL (ref 11–307)

## 2015-10-25 LAB — IRON AND TIBC
Iron: 45 ug/dL (ref 28–170)
Saturation Ratios: 13 % (ref 10.4–31.8)
TIBC: 354 ug/dL (ref 250–450)
UIBC: 310 ug/dL

## 2015-10-25 LAB — TSH: TSH: 1.66 u[IU]/mL (ref 0.350–4.500)

## 2015-10-26 NOTE — Telephone Encounter (Signed)
Called pt using Bonnita Nasuti the Optometrist.  Let pt know that her vitamin b12 level is low and it usually impacts her by having low energy.  MD rec: taking b12 shots and it would start out 1 a week x 6 weeks and then monthly.  She is unsure about it and then she tells Bonnita Nasuti that she has so many charges for all the meds she is getting currently and then the problem with [facility] about her [facility] she does not want to get injections and would like to take it by mouth.  She also wanted to know about her counts and the wbc came back up and neutrophils are good and corcoran will rec: that she start back on ibrance and pt states she has none on hand.  She tells me about waiting for husband to send in proof of income in to determine if pt will qualify for the PAF fund she originally was approved for but then found out they need proof of income to determine if it could continue. I told her I will check on meds and call her back

## 2015-10-26 NOTE — Telephone Encounter (Signed)
-----   Message from Lequita Asal, MD sent at 10/25/2015 10:05 PM EDT ----- Regarding: B12 deficiency  B12 deficiency documented.  Begin B12 weekly x 6 then monthly after preauth.  Orders in.  M  ----- Message ----- From: Interface, Lab In [place] Sent: 10/25/2015   3:46 PM To: Lequita Asal, MD

## 2015-10-27 ENCOUNTER — Telehealth: Payer: Self-pay | Admitting: *Deleted

## 2015-10-27 NOTE — Telephone Encounter (Signed)
Called and spoke to pt who put husband on the phone so both could hear.  They wanted to try oral b12 and I told them it can be given otc 1058mg daily.  That [employer] did get prrof of income and it was sent to [facility] and I will check up on it in am to see how long it will take to get decision.

## 2015-10-27 NOTE — Telephone Encounter (Signed)
I called [facility] and spoke to [facility] and they did receive the proof of income and it takes 24 -48 hours for response and it was rcvd 8/8. Suggested to call back tom. And check again

## 2015-10-27 NOTE — Telephone Encounter (Signed)
-----   Message from Lequita Asal, MD sent at 10/25/2015 10:05 PM EDT ----- Regarding: B12 deficiency  B12 deficiency documented.  Begin B12 weekly x 6 then monthly after preauth.  Orders in.  M  ----- Message ----- From: Interface, Lab In [place] Sent: 10/25/2015   3:46 PM To: Lequita Asal, MD

## 2015-10-29 ENCOUNTER — Telehealth: Payer: Self-pay | Admitting: *Deleted

## 2015-10-29 NOTE — Telephone Encounter (Signed)
Called [employer] and pt has been approved for copay asst for the ibrance.  I then called [facility] and they did not have the approval yest so the copay team will call and get info and then start processing to get ibrance to pt. They state it will not be ready to fill until early next week.  I checked to make sure they have active rx and she has 3 refills on it.  I informed the staff member that pt labs were good on 8/7 and she could have restart then but because we were in holding pattern with copay asst pt can only start when she gets med.  They will try to help her quickly. I called and left message with husband that they have approved pt from pt asst with ibrance and now [employer]ics will have to get info from PAF foundation and then get med to pt.  They did state it would be early next week before they would be able to send her med and she should start it when she gets it and call if any questions.

## 2015-11-15 ENCOUNTER — Inpatient Hospital Stay (HOSPITAL_BASED_OUTPATIENT_CLINIC_OR_DEPARTMENT_OTHER): Payer: Medicare HMO | Admitting: Hematology and Oncology

## 2015-11-15 ENCOUNTER — Other Ambulatory Visit: Payer: Self-pay | Admitting: *Deleted

## 2015-11-15 ENCOUNTER — Inpatient Hospital Stay: Payer: Medicare HMO

## 2015-11-15 ENCOUNTER — Encounter: Payer: Self-pay | Admitting: Hematology and Oncology

## 2015-11-15 ENCOUNTER — Other Ambulatory Visit: Payer: Self-pay | Admitting: Hematology and Oncology

## 2015-11-15 VITALS — BP 134/89 | HR 94 | Temp 96.6°F | Resp 18 | Wt 155.2 lb

## 2015-11-15 DIAGNOSIS — Z9221 Personal history of antineoplastic chemotherapy: Secondary | ICD-10-CM

## 2015-11-15 DIAGNOSIS — C50911 Malignant neoplasm of unspecified site of right female breast: Secondary | ICD-10-CM | POA: Diagnosis not present

## 2015-11-15 DIAGNOSIS — E538 Deficiency of other specified B group vitamins: Secondary | ICD-10-CM

## 2015-11-15 DIAGNOSIS — Z170 Estrogen receptor positive status [ER+]: Secondary | ICD-10-CM | POA: Diagnosis not present

## 2015-11-15 DIAGNOSIS — Z79899 Other long term (current) drug therapy: Secondary | ICD-10-CM

## 2015-11-15 DIAGNOSIS — R21 Rash and other nonspecific skin eruption: Secondary | ICD-10-CM

## 2015-11-15 DIAGNOSIS — Z79818 Long term (current) use of other agents affecting estrogen receptors and estrogen levels: Secondary | ICD-10-CM

## 2015-11-15 DIAGNOSIS — C50919 Malignant neoplasm of unspecified site of unspecified female breast: Secondary | ICD-10-CM

## 2015-11-15 DIAGNOSIS — C7931 Secondary malignant neoplasm of brain: Secondary | ICD-10-CM

## 2015-11-15 DIAGNOSIS — Z923 Personal history of irradiation: Secondary | ICD-10-CM

## 2015-11-15 DIAGNOSIS — C7951 Secondary malignant neoplasm of bone: Secondary | ICD-10-CM

## 2015-11-15 LAB — CBC WITH DIFFERENTIAL/PLATELET
Basophils Absolute: 0.1 10*3/uL (ref 0–0.1)
Basophils Relative: 1 %
Eosinophils Absolute: 0.4 10*3/uL (ref 0–0.7)
Eosinophils Relative: 8 %
HCT: 33.3 % — ABNORMAL LOW (ref 35.0–47.0)
Hemoglobin: 11.3 g/dL — ABNORMAL LOW (ref 12.0–16.0)
Lymphocytes Relative: 15 %
Lymphs Abs: 0.9 10*3/uL — ABNORMAL LOW (ref 1.0–3.6)
MCH: 30 pg (ref 26.0–34.0)
MCHC: 33.8 g/dL (ref 32.0–36.0)
MCV: 88.7 fL (ref 80.0–100.0)
Monocytes Absolute: 0.5 10*3/uL (ref 0.2–0.9)
Monocytes Relative: 9 %
Neutro Abs: 4 10*3/uL (ref 1.4–6.5)
Neutrophils Relative %: 67 %
Platelets: 320 10*3/uL (ref 150–440)
RBC: 3.76 MIL/uL — ABNORMAL LOW (ref 3.80–5.20)
RDW: 19.3 % — ABNORMAL HIGH (ref 11.5–14.5)
WBC: 6 10*3/uL (ref 3.6–11.0)

## 2015-11-15 LAB — COMPREHENSIVE METABOLIC PANEL
ALT: 15 U/L (ref 14–54)
AST: 23 U/L (ref 15–41)
Albumin: 4 g/dL (ref 3.5–5.0)
Alkaline Phosphatase: 76 U/L (ref 38–126)
Anion gap: 8 (ref 5–15)
BUN: 12 mg/dL (ref 6–20)
CO2: 28 mmol/L (ref 22–32)
Calcium: 9.5 mg/dL (ref 8.9–10.3)
Chloride: 103 mmol/L (ref 101–111)
Creatinine, Ser: 0.5 mg/dL (ref 0.44–1.00)
GFR calc Af Amer: >60 mL/min (ref >60)
GFR calc non Af Amer: >60 mL/min (ref >60)
Glucose, Bld: 111 mg/dL — ABNORMAL HIGH (ref 65–99)
Potassium: 3.8 mmol/L (ref 3.5–5.1)
Sodium: 139 mmol/L (ref 135–145)
Total Bilirubin: 0.6 mg/dL (ref 0.3–1.2)
Total Protein: 7.6 g/dL (ref 6.5–8.1)

## 2015-11-15 MED ORDER — FULVESTRANT 250 MG/5ML IM SOLN
500.0000 mg | INTRAMUSCULAR | Status: DC
  Administered 2015-11-15: 500 mg via INTRAMUSCULAR
  Filled 2015-11-15: qty 10

## 2015-11-15 MED ORDER — SODIUM CHLORIDE 0.9% FLUSH
10.0000 mL | Freq: Once | INTRAVENOUS | Status: AC
  Administered 2015-11-15: 10 mL via INTRAVENOUS
  Filled 2015-11-15: qty 10

## 2015-11-15 MED ORDER — HEPARIN SOD (PORK) LOCK FLUSH 100 UNIT/ML IV SOLN
500.0000 [IU] | Freq: Once | INTRAVENOUS | Status: AC
  Administered 2015-11-15: 500 [IU] via INTRAVENOUS

## 2015-11-15 MED ORDER — DENOSUMAB 120 MG/1.7ML ~~LOC~~ SOLN
120.0000 mg | Freq: Once | SUBCUTANEOUS | Status: AC
Start: 2015-11-15 — End: 2015-11-15
  Administered 2015-11-15: 120 mg via SUBCUTANEOUS
  Filled 2015-11-15: qty 1.7

## 2015-11-15 MED ORDER — HEPARIN SOD (PORK) LOCK FLUSH 100 UNIT/ML IV SOLN
INTRAVENOUS | Status: AC
  Filled 2015-11-15: qty 5

## 2015-11-15 NOTE — Progress Notes (Signed)
[facility] day:  11/15/15   Chief Complaint: Melinda Tanner is a 45 y.o. 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   HPI:  The patient was last seen in the medical oncology clinic on 10/18/2015.  At that time, she denied any pain.  She had mild leukopenia (WBC 2200; ANC 1300) following her first cycle of Ibrance.  She received Faslodex and Xegva.  Cycle #2 Leslee Home was awaiting insurance approval.  She underwent an anemia work-up on 10/25/2015.  Reticulocyte count was 2.2%.  Ferritin was 43.  Iron studies included a saturation of 13% and a TIBC of 354. TSH was 1.66 (normal). Folate was 9.3.  B12 was 141 (low).  She declined B12 injections.  She started oral B12 1000 mcg on 11/05/2015.  Symptomatically, she is doing well.  She denies any pain.   Past Medical History:  Diagnosis Date  . Brain cancer ([place]) 2012   Met. from Breast  . Breast cancer ([place]) 2009  . Complication of anesthesia    nausea, "drops in potassium and magnesium"  . Seizures ([place])     Past Surgical History:  Procedure Laterality Date  . BRAIN SURGERY  2012, 2106  . CESAREAN SECTION    . LAPAROSCOPIC BILATERAL SALPINGO OOPHERECTOMY Bilateral 05/18/2015   Procedure: LAPAROSCOPIC BILATERAL SALPINGO OOPHORECTOMY;  Surgeon: Will Bonnet, MD;  Location: [facility] ORS;  Service: Gynecology;  Laterality: Bilateral;  . MASTECTOMY Bilateral 2010    Family History  Problem Relation Age of Onset  . Cancer Paternal Aunt   . Cancer Paternal Uncle   . Cancer Paternal Grandfather   . Hypertension Brother   . Diabetes Paternal Grandmother   A paternal aunt had breast cancer age 67, a paternal uncle had prostate cancer, and a paternal grandfather had leukemia.  Social History:  reports that she has never smoked. She does not have any smokeless tobacco  history on file. She reports that she does not drink alcohol or use drugs.  She is from [place].  She moved to [place] in 2015.  She moved to [place] in 04/2014.  She recently moved into the [place] area.  She has 2 children (boy and girl).  Her family will likely be moving to [facility] in December.  They are looking for a home.  The patient is accompanied by her husband and the Spanish interpreter today.  Allergies:  Allergies  Allergen Reactions  . Taxol [Paclitaxel] Anaphylaxis  . Aspirin Swelling  . Penicillins Swelling  . Zofran [Ondansetron Hcl] Nausea And Vomiting    Current Medications: Current Outpatient Prescriptions  Medication Sig Dispense Refill  . Cyanocobalamin (VITAMIN B-12 PO) Take by mouth.    . dexamethasone (DECADRON) 2 MG tablet Take 1 tablet (2 mg total) by mouth daily. Take 1 tablet daily x 1 week and then take 1 tablet every other day for 1 week , then stop taking 11 tablet 0  . omeprazole (PRILOSEC) 20 MG capsule Take 20 mg by mouth daily.  2  . oxycodone (OXY-IR) 5 MG capsule Take 1 capsule (5 mg total) by mouth every 4 (four) hours as needed. 30 capsule 0  . pantoprazole (PROTONIX) 20 MG tablet TK 1 T PO D  3  . potassium chloride SA (K-DUR,KLOR-CON) 20 MEQ tablet Take 1 tablet (20 mEq total) by mouth 2 (two) times  daily. for 1 days then 1 pill a day x 2 days. 10 tablet 0  . palbociclib (IBRANCE) 125 MG capsule Take 1 capsule daily for 21 days and then take 1 week off and repeat cycle again (Take whole with food.) (Patient not taking: Reported on 11/15/2015) 21 capsule 3   No current facility-administered medications for this visit.     Review of Systems:  GENERAL:  Feels good.  No fevers or sweats.  Weight stable. PERFORMANCE STATUS (ECOG):  1 HEENT:  No visual changes, runny nose, sore throat, mouth sores or tenderness. Lungs: No shortness of breath or cough.  No hemoptysis. Cardiac:  No chest pain, palpitations, orthopnea, or PND. GI:   Appetite good.  No nausea, vomiting, diarrhea, constipation, melena or hematochezia. GU:  No urgency, frequency, dysuria or hematuria. Musculoskeletal: No none or joint pain.  No muscle tenderness. Extremities:  No pain or swelling. Skin:  No rashes or irritation. Neuro:  No headache, numbness or weakness, balance or coordination issues.  Endocrine:  No diabetes, thyroid issues, hot flashes or night sweats. Psych:  Upset about call from collection agency.  No mood changes, depression or anxiety. Pain:  No focal pain. Review of systems:  All other systems reviewed and found to be negative.  Physical Exam: Blood pressure 134/89, pulse 94, temperature (!) 96.6 F (35.9 C), temperature source Tympanic, resp. rate 18, weight 155 lb 3.3 oz (70.4 kg), last menstrual period 03/19/2015. GENERAL:  Well developed, well nourished, woman sitting comfortably in the exam room in no acute distress.  She is tearful at times. MENTAL STATUS:  Alert and oriented to person, place and time. HEAD: Wearing brown wool cap.  Normocephalic, atraumatic, face symmetric, no Cushingoid features. EYES:  Brown eyes. Pupils equal round and reactive to light and accomodation.  No conjunctivitis or scleral icterus. ENT:  Oropharynx clear without lesion.  Tongue normal. Mucous membranes moist.  RESPIRATORY:  Clear to auscultation without rales, wheezes or rhonchi. CARDIOVASCULAR:  Regular rate and rhythm without murmur, rub or gallop. ABDOMEN:  Soft, non-tender, with active bowel sounds, and no hepatosplenomegaly.  No masses. SKIN:  No rashes, bruises or ulcers. EXTREMITIES: No edema, no skin discoloration or tenderness.  No palpable cords. LYMPH NODES: Tiny low right cervical node (smaller).  No palpable supraclavicular, axillary or inguinal adenopathy  NEUROLOGICAL:  Appropriate. PSYCH:  Appropriate.     Pathology: 09/03/2007: Right breast core biopsy: infiltrating duct cell carcinoma high grade ER/PR (reportedly  positive) Her2 (?) 10/02/2007: Right axillary node core biopsy: Fragment with metastatic carcinoma 10/23/2007: Left breast core biopsy: DCIS high grade ER/PR (?) 07/13/2008: Right mastectomy: Invasive ductal carcinoma Grade III Nottingham score = tubules 3+ Nuclei 3+ mitoses 2+) 1.7 cm size. No lymph invasion. Tumor cellularity 20%. [id number]. 07/13/2008: Left mastectomy: Residual ductal carcinoma in situ, high nuclear grade, deep margin negative ypTisNO(sn)MX. 10/25/2010: Abdominal and pelvic ultrasound: questionable lesion near gallbladder fossa recommend MRI follow up. 03/02/2011: Left and right frontal excisional brain biopsy: Adenocarcinoma, metastatic, NOS: ER +, PR 5% +, Her 2 NEG. 03/04/2011: [facility] testing of IHC4 residual recurrence risk score of 114 showing an 8 year recurrence reate of 57%. ER POSTIVE, PR POSITIVE, HER 2 POSTIVE (previously documented negative in 2009) cutoff of 361 patient scores 426. ki67 71%.  08/05/2013: Right breast FNA supraclavicular region: positive for metastatic carcinoma. ER/PR/HER2 not reported. 08/19/2014: Right cervical lymph node. Adenocarcinoma with features consistent with metastatic breast carcinoma. ER/PR/HER2 insufficient tissue. 09/04/2014: Repeat craniotomy, resection of right frontal tumor (metastatic  breast cancer). ER 90-100% PR 51-60% HER2 - 09/29/2014: Right cervical lymph node, metastatic carcinoma c/w breast primary, ER 100% PR 20% HER2-  Imaging: 08/15/2007: Bilateral Mammogram: Dense breasts with solid lesion at 6:00, 7:00, and 9:00 of right breast, solid lesion at 2:00 position left breast BIRADS 4B. 09/10/2007: Breast MRI: Three solid irregular enhancing lesions of the right breast 2.1 x 3.2, 2.8 x 2.7, and 1.8 x 1.6 cm. Mildly enlarged enhancing nodule right axilla. Left breast clumped enhancement midportion suspicious for malignancy. 03/01/2011: Brain MRI: At least two juxtacortical, intra-axial masses with extensive surrounding  vasogenic edema most consistent with intracranial metastasis. 07/22/2013: Brain MRI +/- contrast: interval increase in enhancing component on T2 FLAIR now measuring 1.0cm x 1.1 cm worrisome for progression of disease. 01/21/2014:  PET scan: Hypermetabolic small right supraclavicular and right upper paratracheal lymph nodes suspicious for mets. Hypermetabolic lymph node in the right paratracheal upper mediastinum that measure approximately 0.9 x 0.5cm. (of note no impressions made of areas noted on brain MRI 07/22/13).  01/21/2014: Head MRI: Right frontal enhancing lesion has shown interval increase in size with surrounding edema and local mass effect (measured 1.8x2.1x1.6, previously 1.2x1.1x1.0) 07/16/2014: Head MRI:  Right anterior frontal enhancing mass 2.5 x 2.0 cm compatible with a metastatic lesion has increased and changed in configuration from previous 2.0 x 1.8 cm, formerly multilobular. 08/06/2014: PET scan:  Multiple bilateral FDG avid low cervical, supraclavicular, upper mediastinal, and right internal mammary lymphadenopathy, likely representing metastatic disease. A cervical node in the right lower neck should be amenable to percutaneous sampling.  12/2014: Head MRI:  Evolution of right frontal postoperative changes status post tumor resection at the vertex. Expected postoperative changes. Minimal marginal enhancement resection bed with some adjacent posterior intrinsic T1 signal again seen. Decreasing T2/FLAIR adjacent parenchymal changes.  Stable resection cavity is left posterior frontal and right parieto-occipital regions. 04/20/2015 : PET scan: Cervical and thoracic nodal metastasis.  There was multifocal osseous metastasis (left transverse T4 process, left humeral head, right iliac wing, and left side of the sacrum).  Incidental findings included right nephrolithiasis.   04/22/2015: Bone scan: Corresponded with PET-CT with metastatic lesions evident in the left humeral head, left posterior  aspect of T4, left aspect of S1, and the right iliac crest.  There was no other definite foci of metastatic disease to bone.  Uptake in the skull was most suggestive of the previous right frontal craniotomy. 05/24/2015: Bone density:  T-score of -0.8 in the right femoral neck (normal). 07/07/2015:  Head MRI:  Metastatic breast cancer with 3 resection cavities. The anterior right frontal cavity was positive for nodular marginal enhancement, new from brain MRI report 01/07/2015, and consistent with recurrent disease.  08/12/2015:  Lumbar Spine MRI: Osseous metastatic disease at L3, L4, L5, S1, and in the left iliac bone posteriorly. There was no definite epidural metastatic disease. 08/20/2015:  PET scan:  Response to therapy in the lower cervical and thoracic adenopathy.  There was a mixed response to multifocal osseous metastases (some new lesions).   Labs:  Appointment on 11/15/2015  Component Date Value Ref Range Status  . WBC 11/15/2015 6.0  3.6 - 11.0 K/uL Final  . RBC 11/15/2015 3.76* 3.80 - 5.20 MIL/uL Final  . Hemoglobin 11/15/2015 11.3* 12.0 - 16.0 g/dL Final  . HCT 11/15/2015 33.3* 35.0 - 47.0 % Final  . MCV 11/15/2015 88.7  80.0 - 100.0 fL Final  . MCH 11/15/2015 30.0  26.0 - 34.0 pg Final  . MCHC 11/15/2015 33.8  32.0 - 36.0 g/dL Final  . RDW 11/15/2015 19.3* 11.5 - 14.5 % Final  . Platelets 11/15/2015 320  150 - 440 K/uL Final  . Neutrophils Relative % 11/15/2015 67  % Final  . Neutro Abs 11/15/2015 4.0  1.4 - 6.5 K/uL Final  . Lymphocytes Relative 11/15/2015 15  % Final  . Lymphs Abs 11/15/2015 0.9* 1.0 - 3.6 K/uL Final  . Monocytes Relative 11/15/2015 9  % Final  . Monocytes Absolute 11/15/2015 0.5  0.2 - 0.9 K/uL Final  . Eosinophils Relative 11/15/2015 8  % Final  . Eosinophils Absolute 11/15/2015 0.4  0 - 0.7 K/uL Final  . Basophils Relative 11/15/2015 1  % Final  . Basophils Absolute 11/15/2015 0.1  0 - 0.1 K/uL Final  . Sodium 11/15/2015 139  135 - 145 mmol/L Final  .  Potassium 11/15/2015 3.8  3.5 - 5.1 mmol/L Final  . Chloride 11/15/2015 103  101 - 111 mmol/L Final  . CO2 11/15/2015 28  22 - 32 mmol/L Final  . Glucose, Bld 11/15/2015 111* 65 - 99 mg/dL Final  . BUN 11/15/2015 12  6 - 20 mg/dL Final  . Creatinine, Ser 11/15/2015 0.50  0.44 - 1.00 mg/dL Final  . Calcium 11/15/2015 9.5  8.9 - 10.3 mg/dL Final  . Total Protein 11/15/2015 7.6  6.5 - 8.1 g/dL Final  . Albumin 11/15/2015 4.0  3.5 - 5.0 g/dL Final  . AST 11/15/2015 23  15 - 41 U/L Final  . ALT 11/15/2015 15  14 - 54 U/L Final  . Alkaline Phosphatase 11/15/2015 76  38 - 126 U/L Final  . Total Bilirubin 11/15/2015 0.6  0.3 - 1.2 mg/dL Final  . GFR calc non Af Amer 11/15/2015 >60  >60 mL/min Final  . GFR calc Af Amer 11/15/2015 >60  >60 mL/min Final   Comment: (NOTE) The eGFR has been calculated using the CKD EPI equation. This calculation has not been validated in all clinical situations. eGFR's persistently <60 mL/min signify possible Chronic Kidney Disease.   . Anion gap 11/15/2015 8  5 - 15 Final    Assessment:  Aryahna Spagna [name] is a 45 y.o. 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  She initially presented in 2009 while living in [place] with multi-focal right breast cancer with positive lymph node(s) which was ER+, PR+(low), and HER2/neu+. She received neoadjuvant chemotherapy (AC x 4 every 3 weeks followed by Taxol/Abraxane + carboplatin weekly x 12) without anti-HER2 treatment.    On 07/14/2008, she underwent bilateral mastectomies followed by reconstruction.  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   Zero of 11 lymph nodes on the right were positive for malignancy. 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 Deep margin was negative. Zero of 2 lymph nodes were positive for metastatic disease.  She received adjuvant radiation to the right breast.  She received adjuvant tamoxifen and Zoladex.  She could not  tolerate symptoms of joint pain and tamoxifen was discontinued after 1-2 months.  Zoladex was continued for approximately 1.5 years.  She was diagnosed with brain metastases in 02/2011.  Pathology revealed ER+, PR+(low), and HER2/neu-.  She underwent resection followed by [facility].  On 03/09/2011, original breast tissue (09/03/2007) sent to [facility] testing.  Testing revealed ER+, PR+(borderline), and HER2/neu positive (different than initial testing).  She received Herceptin for 1 year beginning in 2013.  She then began Tykerb and Xeloda after completion  of Herceptin (2014).  She was on extremely low, and probably sub-therapeutic, dosing of lapatinib + capecitabine.   She developed right supraclavicular adenopathy in 2015. She was noted to have slow disease progression in the right frontal/parietal lesion with minimal presence of systemic disease on PET scans. Capecitabine + lapatinib were discontinued in 04/2014.   She underwent repeat craniotomy with resection of brain metastasis on 09/04/2014.  Biopsy of the right supraclavicular lymph node  was ER+,PR+,HER2/neu-.  She started tamoxifen in 09/2014, but discontinued it secondary to pain in hip, back, and shoulder.  PET scan on 04/20/2015 revealed cervical and thoracic nodal metastasis.  There was multifocal osseous metastasis (left transverse T4 process, left humeral head, right iliac wing, and left side of the sacrum).  Incidental findings included right nephrolithiasis.    CA27.29 was 177.8 on 04/16/2015, 276.4 on 05/28/2015, 287.5 on 07/13/2015, 333.8 on 07/26/2015, 412.9 on 08/23/2015, 315.6 on 09/20/2015, 188.8 on 10/18/2015, 151.6 on 11/15/2015.  Bone scan on 04/22/2015 corresponded with PET-CT with metastatic lesions evident in the left humeral head, left posterior aspect of T4, left aspect of S1, and the right iliac crest.  There was no other definite foci of metastatic disease to bone.  Uptake in the skull was most suggestive  of the previous right frontal craniotomy.  Bone density study on 05/24/2015 revealed a T-score of -0.8 in the right femoral neck (normal).  She restarted tamoxifen on 04/23/2015.  Tamoxifen was discontinued on 08/23/2015 secondary to progressive disease.  She received Zoladex on 04/30/2015.  She underwent laparoscopic bilateral oophorectomy on 05/18/2015.  She receives monthly Xgeva (began 04/30/2015; last 09/20/2015).  She was diagnosed with a Proteus mirabilis UTI on 07/13/2015 and was treated with a course of ciprofloxacin.  Head MRI on 07/07/2015 revealed metastatic breast cancer with 3 resection cavities. The anterior right frontal cavity was positive for nodular marginal enhancement, new from brain MRI report 01/07/2015, and consistent with recurrent disease.  She received 30 Gy from 08/05/2015 - 08/19/2015 to the CNS.  Lumbar spine MRI on 08/12/2015 revealed osseous metastatic disease at L3, L4, L5, S1, and in the left iliac bone posteriorly. There was no definite epidural metastatic disease.  PET scan on 08/20/2015 revealed a response to therapy in the lower cervical and thoracic adenopathy.  There was a mixed response to multifocal osseous metastases (some new lesions).   She began Faslodex on 08/23/2015 (last 09/20/2015).  She completed cycle #1 Ibrance (09/20/2015 -10/10/2015).  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  Work-up on 10/25/2015 revealed a B12 of 141 (low).  Ferritin was 43. Iron studies included a saturation of 13% and a TIBC of 354. TSH and folate were normal.  Reticulocyte count was 2.2%.  She declined B12 injections.  She started oral B12 1000 mcg on 11/05/2015.  Diet is good.  She denies any melena or hematochezia.  Symptomatically, she has had no pain in 3 1/2 months.  Counts are normal except for mild anemia.   Calcium is 9.5.  Plan: 1.  Labs today:  CBC with diff, CMP, Mg, CA27.29. 2.  Discuss plan to start cycle #2 of Ibrance when medication available.  Ibrance dose 125 mg. 3.  Xgeva today 4.  Faslodex today 5.  Anticipate follow-up head MRI unless performed by radiation oncology prior to next visit (3 months post XRT). 6.  RTC in 3  weeks for labs (CBC with diff) 7.  RTC in 4 weeks MD assessment, labs (CBC with diff, CMP, B12, CA27.29), Xgeva, Faslodex.   Lequita Asal, MD  11/15/2015, 2:50 PM

## 2015-11-16 LAB — CANCER ANTIGEN 27.29: CA 27.29: 151.5 U/mL — ABNORMAL HIGH (ref 0.0–38.6)

## 2015-11-17 ENCOUNTER — Other Ambulatory Visit: Payer: Self-pay | Admitting: *Deleted

## 2015-11-17 NOTE — Telephone Encounter (Signed)
Called and spoke to [place] and I have confirmed that pt will be on 125 mg dose daily x 3 weeks and then 1 week off. Pt already has refills on that rx and she will try to expedite it because pt was put on hold waiting on approval for co pay asst .

## 2015-11-22 ENCOUNTER — Encounter: Payer: Self-pay | Admitting: Hematology and Oncology

## 2015-12-06 ENCOUNTER — Ambulatory Visit: Payer: Medicare HMO

## 2015-12-06 ENCOUNTER — Ambulatory Visit
Admission: RE | Admit: 2015-12-06 | Discharge: 2015-12-06 | Disposition: A | Discharge status: Home / Self Care | Payer: Medicare HMO | Source: Ambulatory Visit | Attending: Hematology and Oncology | Admitting: Hematology and Oncology

## 2015-12-06 ENCOUNTER — Inpatient Hospital Stay: Payer: Medicare HMO | Attending: Hematology and Oncology

## 2015-12-06 ENCOUNTER — Telehealth: Payer: Self-pay | Admitting: *Deleted

## 2015-12-06 ENCOUNTER — Other Ambulatory Visit: Payer: Self-pay

## 2015-12-06 DIAGNOSIS — C7931 Secondary malignant neoplasm of brain: Secondary | ICD-10-CM | POA: Insufficient documentation

## 2015-12-06 DIAGNOSIS — Z170 Estrogen receptor positive status [ER+]: Secondary | ICD-10-CM | POA: Diagnosis not present

## 2015-12-06 DIAGNOSIS — C7951 Secondary malignant neoplasm of bone: Secondary | ICD-10-CM | POA: Insufficient documentation

## 2015-12-06 DIAGNOSIS — Z923 Personal history of irradiation: Secondary | ICD-10-CM | POA: Diagnosis not present

## 2015-12-06 DIAGNOSIS — E538 Deficiency of other specified B group vitamins: Secondary | ICD-10-CM | POA: Insufficient documentation

## 2015-12-06 DIAGNOSIS — Z9221 Personal history of antineoplastic chemotherapy: Secondary | ICD-10-CM | POA: Diagnosis not present

## 2015-12-06 DIAGNOSIS — D649 Anemia, unspecified: Secondary | ICD-10-CM | POA: Insufficient documentation

## 2015-12-06 DIAGNOSIS — Z853 Personal history of malignant neoplasm of breast: Secondary | ICD-10-CM | POA: Diagnosis not present

## 2015-12-06 DIAGNOSIS — Z0389 Encounter for observation for other suspected diseases and conditions ruled out: Secondary | ICD-10-CM | POA: Diagnosis not present

## 2015-12-06 DIAGNOSIS — Z79899 Other long term (current) drug therapy: Secondary | ICD-10-CM | POA: Diagnosis not present

## 2015-12-06 DIAGNOSIS — C50911 Malignant neoplasm of unspecified site of right female breast: Secondary | ICD-10-CM | POA: Insufficient documentation

## 2015-12-06 DIAGNOSIS — R140 Abdominal distension (gaseous): Secondary | ICD-10-CM | POA: Insufficient documentation

## 2015-12-06 DIAGNOSIS — D72819 Decreased white blood cell count, unspecified: Secondary | ICD-10-CM | POA: Insufficient documentation

## 2015-12-06 LAB — CBC WITH DIFFERENTIAL/PLATELET
Basophils Absolute: 0 10*3/uL (ref 0–0.1)
Basophils Relative: 1 %
Eosinophils Absolute: 0.1 10*3/uL (ref 0–0.7)
Eosinophils Relative: 4 %
HCT: 30.4 % — ABNORMAL LOW (ref 35.0–47.0)
Hemoglobin: 10.4 g/dL — ABNORMAL LOW (ref 12.0–16.0)
Lymphocytes Relative: 32 %
Lymphs Abs: 0.8 10*3/uL — ABNORMAL LOW (ref 1.0–3.6)
MCH: 30.6 pg (ref 26.0–34.0)
MCHC: 34.1 g/dL (ref 32.0–36.0)
MCV: 89.7 fL (ref 80.0–100.0)
Monocytes Absolute: 0.1 10*3/uL — ABNORMAL LOW (ref 0.2–0.9)
Monocytes Relative: 5 %
Neutro Abs: 1.5 10*3/uL (ref 1.4–6.5)
Neutrophils Relative %: 58 %
Platelets: 254 10*3/uL (ref 150–440)
RBC: 3.38 MIL/uL — ABNORMAL LOW (ref 3.80–5.20)
RDW: 17.1 % — ABNORMAL HIGH (ref 11.5–14.5)
WBC: 2.6 10*3/uL — ABNORMAL LOW (ref 3.6–11.0)

## 2015-12-06 MED ORDER — GADOBENATE DIMEGLUMINE 529 MG/ML IV SOLN
15.0000 mL | Freq: Once | INTRAVENOUS | Status: AC | PRN
  Administered 2015-12-06: 15 mL via INTRAVENOUS

## 2015-12-06 NOTE — Telephone Encounter (Signed)
Using translator service Melinda Tanner We called and spoke to pt. Wanted to know what day is she on for Ibrance and she started new dose 9/5 so today she is on 14th day of her 21 day cycle.  Made her aware that wbc dropped but ANC still good.  She should call if she has fever over 100.4/  . Pt then states that she has nose bleeds but under further questions she just blows her nose when she gets stopped up and it has some blood in the discharge.  She also says that she has been having heart burn and she takes protonix every day.  She also states she feels like her stomach is distended.  I told her that I would speak to corcoran and pt would like corcoran to call her back and I explained it would have to be tom. Because md is not here today and pt agreeable to the plan.

## 2015-12-06 NOTE — Telephone Encounter (Signed)
-----   Message from Lequita Asal, MD sent at 12/06/2015  2:33 PM EDT ----- Regarding: Please call patient  Where is she in her Ibrance cycle?  M ----- Message ----- From: Interface, Lab In [place] Sent: 12/06/2015   1:54 PM To: Lequita Asal, MD

## 2015-12-12 ENCOUNTER — Other Ambulatory Visit: Payer: Self-pay | Admitting: Hematology and Oncology

## 2015-12-12 NOTE — Progress Notes (Signed)
[facility] day:  12/13/15   Chief Complaint: Melinda Tanner Tanner is a 45 y.o. 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   HPI:  The patient was last seen in the medical oncology clinic on 11/15/2015.  At that time, she was doing well.  She denied any pain.  She received Xgeva and Faslodex.  She was to start [place] when available (began 11/22/2015).  Head MRI on  12/06/2015 revealed decreased nodular contrast enhancement at the right frontal lobe treatment site.  There was unchanged appearance of treatment sites within the bilateral parietal lobe size without residual or recurrent disease.  There were no new metastatic lesions.  CBC on 12/06/2015 revealed a hematocrit 30.4, hemoglobin 10.4, MCV 89.7, platelets 254,000, white count 2600 with an ANC of 1500.  During the interim, she has felt overall "okay".  She is bothered by her stomach which she describes as mildly distended. She is taking Protonix. She denies any nausea, vomiting, diarrhea, melena or hematochezia. She denies any headache or visual changes.  She denies any fevers or infections.  She is on oral B12 for documented B12 deficiency.   Overall, she is doing well.  She denies any pain.   Past Medical History:  Diagnosis Date  . Brain cancer ([facility]) 2012   Met. from Breast  . Breast cancer ([place]) 2009  . Complication of anesthesia    nausea, "drops in potassium and magnesium"  . Seizures ([facility])     Past Surgical History:  Procedure Laterality Date  . BRAIN SURGERY  2012, 2106  . CESAREAN SECTION    . LAPAROSCOPIC BILATERAL SALPINGO OOPHERECTOMY Bilateral 05/18/2015   Procedure: LAPAROSCOPIC BILATERAL SALPINGO OOPHORECTOMY;  Surgeon: Will Bonnet, MD;  Location: [facility] ORS;  Service: Gynecology;  Laterality: Bilateral;  . MASTECTOMY Bilateral 2010     Family History  Problem Relation Age of Onset  . Cancer Paternal Aunt   . Cancer Paternal Uncle   . Cancer Paternal Grandfather   . Hypertension Brother   . Diabetes Paternal Grandmother   A paternal aunt had breast cancer age 67, a paternal uncle had prostate cancer, and a paternal grandfather had leukemia.  Social History:  reports that she has never smoked. She does not have any smokeless tobacco history on file. She reports that she does not drink alcohol or use drugs.  She is from [place].  She moved to [place] in 2015.  She moved to [place] in 04/2014.  She recently moved into the [place] area.  She has 2 children (boy and girl).  Her family will likely be moving to [facility] in December.  They are looking for a home.  The patient is accompanied by the electronic interpreter via WiFi then the in house Spanish interpreter today.  Allergies:  Allergies  Allergen Reactions  . Taxol [Paclitaxel] Anaphylaxis  . Aspirin Swelling  . Penicillins Swelling  . Zofran [Ondansetron Hcl] Nausea And Vomiting    Current Medications: Current Outpatient Prescriptions  Medication Sig Dispense Refill  . Cyanocobalamin (VITAMIN B-12 PO) Take by mouth.    . dexamethasone (DECADRON) 2 MG tablet Take 1 tablet (2 mg total) by mouth daily. Take 1 tablet daily x 1 week and then take 1 tablet every other day for 1 week , then stop taking 11 tablet 0  . omeprazole (PRILOSEC) 20 MG  capsule Take 20 mg by mouth daily.  2  . oxycodone (OXY-IR) 5 MG capsule Take 1 capsule (5 mg total) by mouth every 4 (four) hours as needed. 30 capsule 0  . palbociclib (IBRANCE) 125 MG capsule Take 1 capsule daily for 21 days and then take 1 week off and repeat cycle again (Take whole with food.) 21 capsule 3  . pantoprazole (PROTONIX) 20 MG tablet TK 1 T PO D  3  . potassium chloride SA (K-DUR,KLOR-CON) 20 MEQ tablet Take 1 tablet (20 mEq total) by mouth 2 (two) times daily. for 1 days then 1 pill a day x 2  days. 10 tablet 0   No current facility-administered medications for this visit.     Review of Systems:  GENERAL:  Feels "pretty good".  No fevers or sweats.  Weight up 2 pounds. PERFORMANCE STATUS (ECOG):  1 HEENT:  No visual changes, runny nose, sore throat, mouth sores or tenderness. Lungs: No shortness of breath or cough.  No hemoptysis. Cardiac:  No chest pain, palpitations, orthopnea, or PND. GI:  Appetite good.  Abdominal distention at times.  No nausea, vomiting, diarrhea, constipation, melena or hematochezia. GU:  No urgency, frequency, dysuria or hematuria. Musculoskeletal: No none or joint pain.  No muscle tenderness. Extremities:  No pain or swelling. Skin:  No rashes or irritation. Neuro:  No headache, numbness or weakness, balance or coordination issues.  Endocrine:  No diabetes, thyroid issues, hot flashes or night sweats. Psych:  No mood changes, depression or anxiety. Pain:  No focal pain. Review of systems:  All other systems reviewed and found to be negative.  Physical Exam: Blood pressure 111/79, pulse 82, temperature 97.3 F (36.3 C), temperature source Tympanic, resp. rate 18, weight 157 lb 1.2 oz (71.2 kg), last menstrual period 03/19/2015. GENERAL:  Well developed, well nourished, woman sitting comfortably in the exam room in no acute distress.  MENTAL STATUS:  Alert and oriented to person, place and time. HEAD: Wearing pink scarf.  Normocephalic, atraumatic, face symmetric, no Cushingoid features. EYES:  Brown eyes. Pupils equal round and reactive to light and accomodation.  No conjunctivitis or scleral icterus. ENT:  Oropharynx clear without lesion.  Tongue normal. Mucous membranes moist.  RESPIRATORY:  Clear to auscultation without rales, wheezes or rhonchi. CARDIOVASCULAR:  Regular rate and rhythm without murmur, rub or gallop. ABDOMEN:  Soft, non-tender, with active bowel sounds, and no hepatosplenomegaly.  No masses. SKIN:  No rashes, bruises or  ulcers. EXTREMITIES:  No edema, no skin discoloration or tenderness.  No palpable cords. LYMPH NODES:  No palpable cervical, supraclavicular, axillary or inguinal adenopathy  NEUROLOGICAL:  Appropriate. PSYCH:  Appropriate.     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 No lymph invasion. Tumor cellularity 20%. [id number]. 07/13/2008: Left mastectomy: Residual ductal carcinoma in situ, high nuclear grade, deep margin negative ypTisNO(sn)MX. 10/25/2010: Abdominal and pelvic ultrasound: questionable lesion near gallbladder fossa recommend MRI follow up. 03/02/2011: Left and right frontal excisional brain biopsy: Adenocarcinoma, metastatic, NOS: ER +, PR 5% +, Her 2 NEG. 03/04/2011: [facility] testing of IHC4 residual recurrence risk score of 114 showing an 8 year recurrence reate of 57%. ER POSTIVE, PR POSITIVE, HER 2 POSTIVE (previously  documented negative in 2009) cutoff of 361 patient scores 426. ki67 71%.  08/05/2013: Right breast FNA supraclavicular region: positive for metastatic carcinoma. ER/PR/HER2 not reported. 08/19/2014: Right cervical lymph node. Adenocarcinoma with features consistent with metastatic breast carcinoma. ER/PR/HER2 insufficient tissue. 09/04/2014: Repeat craniotomy, resection of right frontal tumor (metastatic breast cancer). ER 90-100% PR 51-60% HER2 - 09/29/2014: Right cervical lymph node, metastatic carcinoma c/w breast primary, ER 100% PR 20% HER2-  Imaging: 08/15/2007: Bilateral Mammogram: Dense breasts with solid lesion at 6:00, 7:00, and 9:00 of right breast, solid lesion at 2:00 position left breast BIRADS  4B. 09/10/2007: Breast MRI: Three solid irregular enhancing lesions of the right breast 2.1 x 3.2, 2.8 x 2.7, and 1.8 x 1.6 cm. Mildly enlarged enhancing nodule right axilla. Left breast clumped enhancement midportion suspicious for malignancy. 03/01/2011: Brain MRI: At least two juxtacortical, intra-axial masses with extensive surrounding vasogenic edema most consistent with intracranial metastasis. 07/22/2013: Brain MRI +/- contrast: interval increase in enhancing component on T2 FLAIR now measuring 1.0cm x 1.1 cm worrisome for progression of disease. 01/21/2014:  PET scan: Hypermetabolic small right supraclavicular and right upper paratracheal lymph nodes suspicious for mets. Hypermetabolic lymph node in the right paratracheal upper mediastinum that measure approximately 0.9 x 0.5cm. (of note no impressions made of areas noted on brain MRI 07/22/13).  01/21/2014: Head MRI: Right frontal enhancing lesion has shown interval increase in size with surrounding edema and local mass effect (measured 1.8x2.1x1.6, previously 1.2x1.1x1.0) 07/16/2014: Head MRI:  Right anterior frontal enhancing mass 2.5 x 2.0 cm compatible with a metastatic lesion has increased and changed in configuration from previous 2.0 x 1.8 cm, formerly multilobular. 08/06/2014: PET scan:  Multiple bilateral FDG avid low cervical, supraclavicular, upper mediastinal, and right internal mammary lymphadenopathy, likely representing metastatic disease. A cervical node in the right lower neck should be amenable to percutaneous sampling.  12/2014: Head MRI:  Evolution of right frontal postoperative changes status post tumor resection at the vertex. Expected postoperative changes. Minimal marginal enhancement resection bed with some adjacent posterior intrinsic T1 signal again seen. Decreasing T2/FLAIR adjacent parenchymal changes.  Stable resection cavity is left posterior frontal and right parieto-occipital regions. 04/20/2015 : PET scan: Cervical  and thoracic nodal metastasis.  There was multifocal osseous metastasis (left transverse T4 process, left humeral head, right iliac wing, and left side of the sacrum).  Incidental findings included right nephrolithiasis.   04/22/2015: Bone scan: Corresponded with PET-CT with metastatic lesions evident in the left humeral head, left posterior aspect of T4, left aspect of S1, and the right iliac crest.  There was no other definite foci of metastatic disease to bone.  Uptake in the skull was most suggestive of the previous right frontal craniotomy. 05/24/2015: Bone density:  T-score of -0.8 in the right femoral neck (normal). 07/07/2015:  Head MRI:  Metastatic breast cancer with 3 resection cavities. The anterior right frontal cavity was positive for nodular marginal enhancement, new from brain MRI report 01/07/2015, and consistent with recurrent disease.  08/12/2015:  Lumbar Spine MRI: Osseous metastatic disease at L3, L4, L5, S1, and in the left iliac bone posteriorly. There was no definite epidural metastatic disease. 08/20/2015:  PET scan:  Response to therapy in the lower cervical and thoracic adenopathy.  There was a mixed response to multifocal osseous metastases (some new lesions).  12/06/2015:  Head MRI:  Decreased nodular contrast enhancement at the right frontal lobe treatment site.  There was unchanged appearance of treatment sites within the bilateral parietal lobe size  without residual or recurrent disease.  There were no new metastatic lesions.  Labs:  Appointment on 12/13/2015  Component Date Value Ref Range Status  . WBC 12/13/2015 1.9* 3.6 - 11.0 K/uL Final  . RBC 12/13/2015 3.20* 3.80 - 5.20 MIL/uL Final  . Hemoglobin 12/13/2015 9.9* 12.0 - 16.0 g/dL Final  . HCT 12/13/2015 29.3* 35.0 - 47.0 % Final  . MCV 12/13/2015 91.5  80.0 - 100.0 fL Final  . MCH 12/13/2015 31.0  26.0 - 34.0 pg Final  . MCHC 12/13/2015 33.8  32.0 - 36.0 g/dL Final  . RDW 12/13/2015 17.8* 11.5 - 14.5 % Final  .  Platelets 12/13/2015 150  150 - 440 K/uL Final  . Neutrophils Relative % 12/13/2015 60  % Final  . Neutro Abs 12/13/2015 1.1* 1.4 - 6.5 K/uL Final  . Lymphocytes Relative 12/13/2015 34  % Final  . Lymphs Abs 12/13/2015 0.6* 1.0 - 3.6 K/uL Final  . Monocytes Relative 12/13/2015 4  % Final  . Monocytes Absolute 12/13/2015 0.1* 0.2 - 0.9 K/uL Final  . Eosinophils Relative 12/13/2015 1  % Final  . Eosinophils Absolute 12/13/2015 0.0  0 - 0.7 K/uL Final  . Basophils Relative 12/13/2015 1  % Final  . Basophils Absolute 12/13/2015 0.0  0 - 0.1 K/uL Final  . Sodium 12/13/2015 138  135 - 145 mmol/L Final  . Potassium 12/13/2015 3.9  3.5 - 5.1 mmol/L Final  . Chloride 12/13/2015 106  101 - 111 mmol/L Final  . CO2 12/13/2015 26  22 - 32 mmol/L Final  . Glucose, Bld 12/13/2015 101* 65 - 99 mg/dL Final  . BUN 12/13/2015 12  6 - 20 mg/dL Final  . Creatinine, Ser 12/13/2015 0.58  0.44 - 1.00 mg/dL Final  . Calcium 12/13/2015 8.8* 8.9 - 10.3 mg/dL Final  . Total Protein 12/13/2015 7.5  6.5 - 8.1 g/dL Final  . Albumin 12/13/2015 4.2  3.5 - 5.0 g/dL Final  . AST 12/13/2015 18  15 - 41 U/L Final  . ALT 12/13/2015 10* 14 - 54 U/L Final  . Alkaline Phosphatase 12/13/2015 67  38 - 126 U/L Final  . Total Bilirubin 12/13/2015 0.9  0.3 - 1.2 mg/dL Final  . GFR calc non Af Amer 12/13/2015 >60  >60 mL/min Final  . GFR calc Af Amer 12/13/2015 >60  >60 mL/min Final   Comment: (NOTE) The eGFR has been calculated using the CKD EPI equation. This calculation has not been validated in all clinical situations. eGFR's persistently <60 mL/min signify possible Chronic Kidney Disease.   . Anion gap 12/13/2015 6  5 - 15 Final    Assessment:  Melinda Tanner is a 45 y.o. 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  She initially presented in 2009 while living in [place] with multi-focal right breast cancer with positive lymph node(s) which was ER+, PR+(low), and HER2/neu+. She received  neoadjuvant chemotherapy (AC x 4 every 3 weeks followed by Taxol/Abraxane + carboplatin weekly x 12) without anti-HER2 treatment.    On 07/14/2008, she underwent bilateral mastectomies followed by reconstruction.  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   Zero of 11 lymph nodes on the right were positive for malignancy. 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 Deep margin was negative. Zero of 2 lymph nodes were positive for metastatic disease.  She received adjuvant radiation to the right breast.  She received adjuvant tamoxifen and Zoladex.  She  could not tolerate symptoms of joint pain and tamoxifen was discontinued after 1-2 months.  Zoladex was continued for approximately 1.5 years.  She was diagnosed with brain metastases in 02/2011.  Pathology revealed ER+, PR+(low), and HER2/neu-.  She underwent resection followed by [facility].  On 03/09/2011, original breast tissue (09/03/2007) sent to [facility] testing.  Testing revealed ER+, PR+(borderline), and HER2/neu positive (different than initial testing).  She received Herceptin for 1 year beginning in 2013.  She then began Tykerb and Xeloda after completion of Herceptin (2014).  She was on extremely low, and probably sub-therapeutic, dosing of lapatinib + capecitabine.   She developed right supraclavicular adenopathy in 2015. She was noted to have slow disease progression in the right frontal/parietal lesion with minimal presence of systemic disease on PET scans. Capecitabine + lapatinib were discontinued in 04/2014.   She underwent repeat craniotomy with resection of brain metastasis on 09/04/2014.  Biopsy of the right supraclavicular lymph node  was ER+,PR+,HER2/neu-.  She started tamoxifen in 09/2014, but discontinued it secondary to pain in hip, back, and shoulder.  PET scan on 04/20/2015 revealed cervical and thoracic nodal metastasis.  There was multifocal osseous metastasis (left  transverse T4 process, left humeral head, right iliac wing, and left side of the sacrum).  Incidental findings included right nephrolithiasis.    CA27.29 was 177.8 on 04/16/2015, 276.4 on 05/28/2015, 287.5 on 07/13/2015, 333.8 on 07/26/2015, 412.9 on 08/23/2015, 315.6 on 09/20/2015, 188.8 on 10/18/2015, and 151.6 on 11/15/2015.  Bone scan on 04/22/2015 corresponded with PET-CT with metastatic lesions evident in the left humeral head, left posterior aspect of T4, left aspect of S1, and the right iliac crest.  There was no other definite foci of metastatic disease to bone.  Uptake in the skull was most suggestive of the previous right frontal craniotomy.  Bone density study on 05/24/2015 revealed a T-score of -0.8 in the right femoral neck (normal).  She restarted tamoxifen on 04/23/2015.  Tamoxifen was discontinued on 08/23/2015 secondary to progressive disease.  She received Zoladex on 04/30/2015.  She underwent laparoscopic bilateral oophorectomy on 05/18/2015.  She receives monthly Xgeva (began 04/30/2015; last 09/20/2015).  She was diagnosed with a Proteus mirabilis UTI on 07/13/2015 and was treated with a course of ciprofloxacin.  Head MRI on 07/07/2015 revealed metastatic breast cancer with 3 resection cavities. The anterior right frontal cavity was positive for nodular marginal enhancement, new from brain MRI report 01/07/2015, and consistent with recurrent disease.  She received 30 Gy from 08/05/2015 - 08/19/2015 to the CNS.  Head MRI on  12/06/2015 revealed decreased nodular contrast enhancement at the right frontal lobe treatment site.  There was unchanged appearance of treatment sites within the bilateral parietal lobe size without residual or recurrent disease.  There were no new metastatic lesions.  Lumbar spine MRI on 08/12/2015 revealed osseous metastatic disease at L3, L4, L5, S1, and in the left iliac bone posteriorly. There was no definite epidural metastatic disease.  PET scan on  08/20/2015 revealed a response to therapy in the lower cervical and thoracic adenopathy.  There was a mixed response to multifocal osseous metastases (some new lesions).   She began Faslodex on 08/23/2015 (last 11/15/2015).  She completed 2 cycles of  Ibrance (09/20/2015 - 11/22/2015).  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  Work-up on 10/25/2015 revealed a B12 of 141 (low).  Ferritin was 43. Iron studies included a saturation of 13% and a TIBC of 354. TSH and folate were normal.  Reticulocyte count was 2.2%.  She declined B12 injections.  She started oral B12 1000 mcg on 11/05/2015.  Diet is good.  She denies any melena or hematochezia.  Symptomatically, she has had no pain in 4 1/2 months.  She has some mild abdominal distention likely due to gas/swallowed air.  She has mild leukopenia (WBC 1900; ANC 1100) following her first cycle of Ibrance. She receives Faslodex and Xegva monthly.  Calcium is 8.8 (slightly low).  Plan: 1.  Labs today:  CBC with diff, CMP, B12, CA27.29. 2.  Discuss trial of simethicone (Gas-X) for abdominal distention.  Discuss continuation of Protonix. 3.  Xgeva today. 4.  Faslodex today. 5.  Discuss calcium supplementation. 6.  Discuss plan to start cycle #3 of Ibrance (dose 100 mg) when counts recover. 7.  Continue oral B12.  Check B12 level today to ensure patient is absorbing. 8.  Review head MRI.  No evidence of recurrent disease.  Repeat scans in 3 months. 9.  RTC in 1 weeks for labs (CBC with diff). 10.  RTC in 4 weeks for MD assessment, labs (CBC with diff, CMP, CA27.29, magnesium), Xgeva and Faslodex.   Lequita Asal, MD  12/13/2015, 2:35 PM

## 2015-12-13 ENCOUNTER — Inpatient Hospital Stay: Payer: Medicare HMO

## 2015-12-13 ENCOUNTER — Inpatient Hospital Stay (HOSPITAL_BASED_OUTPATIENT_CLINIC_OR_DEPARTMENT_OTHER): Payer: Medicare HMO | Admitting: Hematology and Oncology

## 2015-12-13 ENCOUNTER — Encounter: Payer: Self-pay | Admitting: Hematology and Oncology

## 2015-12-13 ENCOUNTER — Other Ambulatory Visit: Payer: Self-pay | Admitting: *Deleted

## 2015-12-13 VITALS — BP 111/79 | HR 82 | Temp 97.3°F | Resp 18 | Wt 157.1 lb

## 2015-12-13 DIAGNOSIS — C50911 Malignant neoplasm of unspecified site of right female breast: Secondary | ICD-10-CM

## 2015-12-13 DIAGNOSIS — E538 Deficiency of other specified B group vitamins: Secondary | ICD-10-CM

## 2015-12-13 DIAGNOSIS — D649 Anemia, unspecified: Secondary | ICD-10-CM | POA: Diagnosis not present

## 2015-12-13 DIAGNOSIS — C7931 Secondary malignant neoplasm of brain: Secondary | ICD-10-CM

## 2015-12-13 DIAGNOSIS — Z170 Estrogen receptor positive status [ER+]: Secondary | ICD-10-CM | POA: Diagnosis not present

## 2015-12-13 DIAGNOSIS — Z9221 Personal history of antineoplastic chemotherapy: Secondary | ICD-10-CM | POA: Diagnosis not present

## 2015-12-13 DIAGNOSIS — C7951 Secondary malignant neoplasm of bone: Secondary | ICD-10-CM

## 2015-12-13 DIAGNOSIS — Z923 Personal history of irradiation: Secondary | ICD-10-CM | POA: Diagnosis not present

## 2015-12-13 DIAGNOSIS — D72819 Decreased white blood cell count, unspecified: Secondary | ICD-10-CM | POA: Diagnosis not present

## 2015-12-13 DIAGNOSIS — R140 Abdominal distension (gaseous): Secondary | ICD-10-CM | POA: Diagnosis not present

## 2015-12-13 DIAGNOSIS — K219 Gastro-esophageal reflux disease without esophagitis: Secondary | ICD-10-CM

## 2015-12-13 DIAGNOSIS — Z79899 Other long term (current) drug therapy: Secondary | ICD-10-CM

## 2015-12-13 DIAGNOSIS — IMO0001 Reserved for inherently not codable concepts without codable children: Secondary | ICD-10-CM

## 2015-12-13 DIAGNOSIS — C50919 Malignant neoplasm of unspecified site of unspecified female breast: Secondary | ICD-10-CM

## 2015-12-13 LAB — COMPREHENSIVE METABOLIC PANEL
ALT: 10 U/L — ABNORMAL LOW (ref 14–54)
AST: 18 U/L (ref 15–41)
Albumin: 4.2 g/dL (ref 3.5–5.0)
Alkaline Phosphatase: 67 U/L (ref 38–126)
Anion gap: 6 (ref 5–15)
BUN: 12 mg/dL (ref 6–20)
CO2: 26 mmol/L (ref 22–32)
Calcium: 8.8 mg/dL — ABNORMAL LOW (ref 8.9–10.3)
Chloride: 106 mmol/L (ref 101–111)
Creatinine, Ser: 0.58 mg/dL (ref 0.44–1.00)
GFR calc Af Amer: >60 mL/min (ref >60)
GFR calc non Af Amer: >60 mL/min (ref >60)
Glucose, Bld: 101 mg/dL — ABNORMAL HIGH (ref 65–99)
Potassium: 3.9 mmol/L (ref 3.5–5.1)
Sodium: 138 mmol/L (ref 135–145)
Total Bilirubin: 0.9 mg/dL (ref 0.3–1.2)
Total Protein: 7.5 g/dL (ref 6.5–8.1)

## 2015-12-13 LAB — CBC WITH DIFFERENTIAL/PLATELET
Basophils Absolute: 0 10*3/uL (ref 0–0.1)
Basophils Relative: 1 %
Eosinophils Absolute: 0 10*3/uL (ref 0–0.7)
Eosinophils Relative: 1 %
HCT: 29.3 % — ABNORMAL LOW (ref 35.0–47.0)
Hemoglobin: 9.9 g/dL — ABNORMAL LOW (ref 12.0–16.0)
Lymphocytes Relative: 34 %
Lymphs Abs: 0.6 10*3/uL — ABNORMAL LOW (ref 1.0–3.6)
MCH: 31 pg (ref 26.0–34.0)
MCHC: 33.8 g/dL (ref 32.0–36.0)
MCV: 91.5 fL (ref 80.0–100.0)
Monocytes Absolute: 0.1 10*3/uL — ABNORMAL LOW (ref 0.2–0.9)
Monocytes Relative: 4 %
Neutro Abs: 1.1 10*3/uL — ABNORMAL LOW (ref 1.4–6.5)
Neutrophils Relative %: 60 %
Platelets: 150 10*3/uL (ref 150–440)
RBC: 3.2 MIL/uL — ABNORMAL LOW (ref 3.80–5.20)
RDW: 17.8 % — ABNORMAL HIGH (ref 11.5–14.5)
WBC: 1.9 10*3/uL — ABNORMAL LOW (ref 3.6–11.0)

## 2015-12-13 LAB — VITAMIN B12: Vitamin B-12: 205 pg/mL (ref 180–914)

## 2015-12-13 MED ORDER — DENOSUMAB 120 MG/1.7ML ~~LOC~~ SOLN
120.0000 mg | Freq: Once | SUBCUTANEOUS | Status: AC
  Administered 2015-12-13: 120 mg via SUBCUTANEOUS
  Filled 2015-12-13: qty 1.7

## 2015-12-13 MED ORDER — FULVESTRANT 250 MG/5ML IM SOLN
500.0000 mg | INTRAMUSCULAR | Status: DC
  Administered 2015-12-13: 500 mg via INTRAMUSCULAR
  Filled 2015-12-13: qty 10

## 2015-12-13 MED ORDER — PALBOCICLIB 100 MG PO CAPS: ORAL_CAPSULE | ORAL | 1 refills | Status: DC

## 2015-12-13 NOTE — Progress Notes (Signed)
Patient is here today for follow up on results, she does not want a flu shot.

## 2015-12-14 ENCOUNTER — Encounter: Payer: Self-pay | Admitting: Hematology and Oncology

## 2015-12-14 ENCOUNTER — Telehealth: Payer: Self-pay | Admitting: *Deleted

## 2015-12-14 ENCOUNTER — Other Ambulatory Visit: Payer: Self-pay | Admitting: *Deleted

## 2015-12-14 LAB — CANCER ANTIGEN 27.29: CA 27.29: 122.7 U/mL — ABNORMAL HIGH (ref 0.0–38.6)

## 2015-12-14 MED ORDER — PALBOCICLIB 100 MG PO CAPS: ORAL_CAPSULE | ORAL | 1 refills | Status: DC

## 2015-12-14 NOTE — Telephone Encounter (Signed)
Called pt with translator services using Ronnald Collum and told her that B12 level improved and the goal would be 300 and we will check it with later blood draw.  She should cont. Her b12 by  Mouth. She asked about tumor markers and it has decreased to 122.7 from 151.5. She was happy that it went down.  Also she told me that she called [facility] and cancelled her shipment of drug because the doctor was going to decrease the dosage and I checked with Mike Gip and she put in for new dose 100 mg but it did not print. I printed it and I told pt that I will send new rx to [facility] but pt instructed even if she gets new dose she should not start it until she gets labs done on 10/2 and our office calls her to tell her it is ok based on her counts and she is agreeable to this plan.  Faxed rx to [facility]

## 2015-12-14 NOTE — Telephone Encounter (Signed)
-----   Message from Lequita Asal, MD sent at 12/13/2015  7:48 PM EDT ----- Regarding: Please call patient  B12 is slightly better (still low). We will check level next month.  M  ----- Message ----- From: Interface, Lab In [place] Sent: 12/13/2015   1:55 PM To: Lequita Asal, MD

## 2015-12-15 ENCOUNTER — Other Ambulatory Visit: Payer: Self-pay | Admitting: *Deleted

## 2015-12-15 DIAGNOSIS — E538 Deficiency of other specified B group vitamins: Secondary | ICD-10-CM

## 2015-12-20 ENCOUNTER — Inpatient Hospital Stay: Payer: Medicare HMO | Attending: Hematology and Oncology

## 2015-12-20 ENCOUNTER — Ambulatory Visit: Payer: Medicare HMO

## 2015-12-20 DIAGNOSIS — Z79899 Other long term (current) drug therapy: Secondary | ICD-10-CM | POA: Insufficient documentation

## 2015-12-20 DIAGNOSIS — C50911 Malignant neoplasm of unspecified site of right female breast: Secondary | ICD-10-CM

## 2015-12-20 DIAGNOSIS — Z9221 Personal history of antineoplastic chemotherapy: Secondary | ICD-10-CM | POA: Diagnosis not present

## 2015-12-20 DIAGNOSIS — D649 Anemia, unspecified: Secondary | ICD-10-CM | POA: Insufficient documentation

## 2015-12-20 DIAGNOSIS — C7931 Secondary malignant neoplasm of brain: Secondary | ICD-10-CM

## 2015-12-20 DIAGNOSIS — D72819 Decreased white blood cell count, unspecified: Secondary | ICD-10-CM | POA: Insufficient documentation

## 2015-12-20 DIAGNOSIS — C7951 Secondary malignant neoplasm of bone: Secondary | ICD-10-CM

## 2015-12-20 DIAGNOSIS — R140 Abdominal distension (gaseous): Secondary | ICD-10-CM | POA: Insufficient documentation

## 2015-12-20 DIAGNOSIS — Z170 Estrogen receptor positive status [ER+]: Secondary | ICD-10-CM | POA: Insufficient documentation

## 2015-12-20 DIAGNOSIS — E538 Deficiency of other specified B group vitamins: Secondary | ICD-10-CM | POA: Diagnosis not present

## 2015-12-20 DIAGNOSIS — Z923 Personal history of irradiation: Secondary | ICD-10-CM | POA: Diagnosis not present

## 2015-12-20 LAB — CBC WITH DIFFERENTIAL/PLATELET
Basophils Absolute: 0.1 10*3/uL (ref 0–0.1)
Basophils Relative: 3 %
Eosinophils Absolute: 0 10*3/uL (ref 0–0.7)
Eosinophils Relative: 1 %
HCT: 29.7 % — ABNORMAL LOW (ref 35.0–47.0)
Hemoglobin: 10.2 g/dL — ABNORMAL LOW (ref 12.0–16.0)
Lymphocytes Relative: 26 %
Lymphs Abs: 0.9 10*3/uL — ABNORMAL LOW (ref 1.0–3.6)
MCH: 31.5 pg (ref 26.0–34.0)
MCHC: 34.4 g/dL (ref 32.0–36.0)
MCV: 91.6 fL (ref 80.0–100.0)
Monocytes Absolute: 0.5 10*3/uL (ref 0.2–0.9)
Monocytes Relative: 15 %
Neutro Abs: 1.8 10*3/uL (ref 1.4–6.5)
Neutrophils Relative %: 55 %
Platelets: 255 10*3/uL (ref 150–440)
RBC: 3.24 MIL/uL — ABNORMAL LOW (ref 3.80–5.20)
RDW: 19.2 % — ABNORMAL HIGH (ref 11.5–14.5)
WBC: 3.2 10*3/uL — ABNORMAL LOW (ref 3.6–11.0)

## 2015-12-21 ENCOUNTER — Telehealth: Payer: Self-pay | Admitting: *Deleted

## 2015-12-21 NOTE — Telephone Encounter (Signed)
Called pt using interpreter service Melinda Tanner) and gave her that cbc results have came back up and she is able to restart Ibrance but with the dose reduction to 100 mg.  Patient states that she does not have drug.  I faxed the new rx in last week with dose change and said to deliver this week because last week was her week off.  The pt will call [facility] and get med delivered and she will call me for the date she starts Ibrance this week. Patient has my direct number

## 2015-12-30 ENCOUNTER — Telehealth: Payer: Self-pay | Admitting: *Deleted

## 2015-12-30 NOTE — Telephone Encounter (Signed)
The husband wanted me to know that pt started on her ibrance last Friday 12/24/2015

## 2016-01-09 NOTE — Progress Notes (Signed)
Melinda Tanner is a 45 y.o. 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   HPI:  The patient was last seen in the medical oncology clinic on 12/13/2015.  At that time, she was doing well.  She denied any pain.  She had some mild abdominal distention likely due to gas/swallowed air.  She had mild leukopenia (WBC 1900; ANC 1100) following her first cycle of Ibrance. Calcium was 8.8 (slightly low).  She received Xgeva and Faslodex.  We discussed calcium supplementation.  We discuss the plan to start cycle #3 of Ibrance (dose 100 mg) when counts recovered.  CBC on 12/20/2015 revealed a hematocrit of 29.7, hemoglobin 10.2, MCV 91.6, platelets 255,000, WBC 3200 with an ANC of 1800.  Symptomatically, she is doing well.  She notes some mild epigastric discomfort.  She is taking Protonix.  She had some burping after pizza the other night.  She is not taking her B12 regularly.  She is taking Ibrance.  She has 4 days left in cycle #3.  She notes less nausea with the new dose.   Past Medical History:  Diagnosis Date  . Brain cancer ([place]) 2012   Met. from Breast  . Breast cancer ([place]) 2009  . Complication of anesthesia    nausea, "drops in potassium and magnesium"  . Seizures ([place])     Past Surgical History:  Procedure Laterality Date  . BRAIN SURGERY  2012, 2106  . CESAREAN SECTION    . LAPAROSCOPIC BILATERAL SALPINGO OOPHERECTOMY Bilateral 05/18/2015   Procedure: LAPAROSCOPIC BILATERAL SALPINGO OOPHORECTOMY;  Surgeon: Will Bonnet, MD;  Location: [facility] ORS;  Service: Gynecology;  Laterality: Bilateral;  . MASTECTOMY Bilateral 2010    Family History  Problem Relation Age of Onset  . Cancer Paternal Aunt   . Cancer Paternal Uncle   . Cancer Paternal Grandfather   .  Hypertension Brother   . Diabetes Paternal Grandmother   A paternal aunt had breast cancer age 7, a paternal uncle had prostate cancer, and a paternal grandfather had leukemia.  Social History:  reports that she has never smoked. She does not have any smokeless tobacco history on file. She reports that she does not drink alcohol or use drugs.  She is from [place].  She moved to [place] in 2015.  She moved to [place] in 04/2014.  She recently moved into the [place] area.  She has 2 children (boy and girl).  Her family will likely be moving to [facility] in December.  They are looking for a home.  The patient is accompanied by her husband and the Spanish interpreter today.  Allergies:  Allergies  Allergen Reactions  . Taxol [Paclitaxel] Anaphylaxis  . Aspirin Swelling  . Penicillins Swelling  . Zofran [Ondansetron Hcl] Nausea And Vomiting    Current Medications: Current Outpatient Prescriptions  Medication Sig Dispense Refill  . Cyanocobalamin (VITAMIN B-12 PO) Take by mouth.    . dexamethasone (DECADRON) 2 MG tablet Take 1 tablet (2 mg total) by mouth daily. Take 1 tablet daily x 1 week and then take 1 tablet every other day for 1 week , then stop taking 11 tablet 0  . omeprazole (PRILOSEC) 20 MG capsule Take 20 mg by mouth daily.  2  . oxycodone (OXY-IR) 5  MG capsule Take 1 capsule (5 mg total) by mouth every 4 (four) hours as needed. 30 capsule 0  . palbociclib (IBRANCE) 100 MG capsule Take 1 capsule daily for 21 days and then take 1 week off and repeat cycle again (Take whole with food.) 21 capsule 1  . pantoprazole (PROTONIX) 20 MG tablet TK 1 T PO D  3  . potassium chloride SA (K-DUR,KLOR-CON) 20 MEQ tablet Take 1 tablet (20 mEq total) by mouth 2 (two) times daily. for 1 days then 1 pill a day x 2 days. 10 tablet 0   No current facility-administered medications for this visit.     Review of Systems:  GENERAL:  Feels "good".  No fevers or sweats.  Weight down 1  pound. PERFORMANCE STATUS (ECOG):  1 HEENT:  No visual changes, runny nose, sore throat, mouth sores or tenderness. Lungs: No shortness of breath or cough.  No hemoptysis. Cardiac:  No chest pain, palpitations, orthopnea, or PND. GI:  Appetite good.  Mild epigasric tenderness, taking Protonix.  Less nausea on lower dose Ibrance.  No vomiting, diarrhea, constipation, melena or hematochezia. GU:  No urgency, frequency, dysuria or hematuria. Musculoskeletal: No none or joint pain.  No muscle tenderness. Extremities:  No pain or swelling. Skin:  No rashes or irritation. Neuro:  No headache, numbness or weakness, balance or coordination issues.  Endocrine:  No diabetes, thyroid issues, hot flashes or night sweats. Psych:  No mood changes, depression or anxiety. Pain:  No focal pain. Review of systems:  All other systems reviewed and found to be negative.  Physical Exam: Blood pressure 123/87, pulse 89, temperature 98.1 F (36.7 C), temperature source Tympanic, resp. rate 18, weight 156 lb 12 oz (71.1 kg), last menstrual period 03/19/2015. GENERAL:  Well developed, well nourished, woman sitting comfortably in the exam room in no acute distress.  MENTAL STATUS:  Alert and oriented to person, place and time. HEAD: Wearing a scarf.  Normocephalic, atraumatic, face symmetric, no Cushingoid features. EYES:  Brown eyes. Pupils equal round and reactive to light and accomodation.  No conjunctivitis or scleral icterus. ENT:  Oropharynx clear without lesion.  Tongue normal. Mucous membranes moist.  RESPIRATORY:  Clear to auscultation without rales, wheezes or rhonchi. CARDIOVASCULAR:  Regular rate and rhythm without murmur, rub or gallop. ABDOMEN:  Soft, non-tender, with active bowel sounds, and no hepatosplenomegaly.  No masses. SKIN:  No rashes, bruises or ulcers. EXTREMITIES:  No edema, no skin discoloration or tenderness.  No palpable cords. LYMPH NODES:  No palpable cervical, supraclavicular,  axillary or inguinal adenopathy  NEUROLOGICAL:  Appropriate. PSYCH:  Appropriate.     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 No lymph invasion. Tumor cellularity 20%. [id number]. 07/13/2008: Left mastectomy: Residual ductal carcinoma in situ, high nuclear grade, deep margin negative ypTisNO(sn)MX. 10/25/2010: Abdominal and pelvic ultrasound: questionable lesion near gallbladder fossa recommend MRI follow up. 03/02/2011: Left and right frontal excisional brain biopsy: Adenocarcinoma, metastatic, NOS: ER +, PR 5% +, Her 2 NEG. 03/04/2011: [facility] testing of IHC4 residual recurrence risk score of 114 showing an 8 year recurrence reate of 57%. ER POSTIVE, PR POSITIVE, HER 2 POSTIVE (previously documented negative in 2009) cutoff of 361 patient  scores 426. ki67 71%.  08/05/2013: Right breast FNA supraclavicular region: positive for metastatic carcinoma. ER/PR/HER2 not reported. 08/19/2014: Right cervical lymph node. Adenocarcinoma with features consistent with metastatic breast carcinoma. ER/PR/HER2 insufficient tissue. 09/04/2014: Repeat craniotomy, resection of right frontal tumor (metastatic breast cancer). ER 90-100% PR 51-60% HER2 - 09/29/2014: Right cervical lymph node, metastatic carcinoma c/w breast primary, ER 100% PR 20% HER2-  Imaging: 08/15/2007: Bilateral Mammogram: Dense breasts with solid lesion at 6:00, 7:00, and 9:00 of right breast, solid lesion at 2:00 position left breast BIRADS 4B. 09/10/2007: Breast MRI: Three solid irregular enhancing lesions of the right breast 2.1 x 3.2, 2.8 x 2.7, and 1.8 x 1.6 cm. Mildly enlarged  enhancing nodule right axilla. Left breast clumped enhancement midportion suspicious for malignancy. 03/01/2011: Brain MRI: At least two juxtacortical, intra-axial masses with extensive surrounding vasogenic edema most consistent with intracranial metastasis. 07/22/2013: Brain MRI +/- contrast: interval increase in enhancing component on T2 FLAIR now measuring 1.0cm x 1.1 cm worrisome for progression of disease. 01/21/2014:  PET scan: Hypermetabolic small right supraclavicular and right upper paratracheal lymph nodes suspicious for mets. Hypermetabolic lymph node in the right paratracheal upper mediastinum that measure approximately 0.9 x 0.5cm. (of note no impressions made of areas noted on brain MRI 07/22/13).  01/21/2014: Head MRI: Right frontal enhancing lesion has shown interval increase in size with surrounding edema and local mass effect (measured 1.8x2.1x1.6, previously 1.2x1.1x1.0) 07/16/2014: Head MRI:  Right anterior frontal enhancing mass 2.5 x 2.0 cm compatible with a metastatic lesion has increased and changed in configuration from previous 2.0 x 1.8 cm, formerly multilobular. 08/06/2014: PET scan:  Multiple bilateral FDG avid low cervical, supraclavicular, upper mediastinal, and right internal mammary lymphadenopathy, likely representing metastatic disease. A cervical node in the right lower neck should be amenable to percutaneous sampling.  12/2014: Head MRI:  Evolution of right frontal postoperative changes status post tumor resection at the vertex. Expected postoperative changes. Minimal marginal enhancement resection bed with some adjacent posterior intrinsic T1 signal again seen. Decreasing T2/FLAIR adjacent parenchymal changes.  Stable resection cavity is left posterior frontal and right parieto-occipital regions. 04/20/2015 : PET scan: Cervical and thoracic nodal metastasis.  There was multifocal osseous metastasis (left transverse T4 process, left humeral head, right iliac wing, and left  side of the sacrum).  Incidental findings included right nephrolithiasis.   04/22/2015: Bone scan: Corresponded with PET-CT with metastatic lesions evident in the left humeral head, left posterior aspect of T4, left aspect of S1, and the right iliac crest.  There was no other definite foci of metastatic disease to bone.  Uptake in the skull was most suggestive of the previous right frontal craniotomy. 05/24/2015: Bone density:  T-score of -0.8 in the right femoral neck (normal). 07/07/2015:  Head MRI:  Metastatic breast cancer with 3 resection cavities. The anterior right frontal cavity was positive for nodular marginal enhancement, new from brain MRI report 01/07/2015, and consistent with recurrent disease.  08/12/2015:  Lumbar Spine MRI: Osseous metastatic disease at L3, L4, L5, S1, and in the left iliac bone posteriorly. There was no definite epidural metastatic disease. 08/20/2015:  PET scan:  Response to therapy in the lower cervical and thoracic adenopathy.  There was a mixed response to multifocal osseous metastases (some new lesions).  12/06/2015:  Head MRI:  Decreased nodular contrast enhancement at the right frontal lobe treatment site.  There was unchanged appearance of treatment sites within the bilateral parietal lobe size without residual or recurrent disease.  There were  no new metastatic lesions.  Labs:  Appointment on 01/10/2016  Component Date Value Ref Range Status  . WBC 01/10/2016 2.4* 3.6 - 11.0 K/uL Final  . RBC 01/10/2016 3.22* 3.80 - 5.20 MIL/uL Final  . Hemoglobin 01/10/2016 10.4* 12.0 - 16.0 g/dL Final  . HCT 01/10/2016 30.7* 35.0 - 47.0 % Final  . MCV 01/10/2016 95.3  80.0 - 100.0 fL Final  . MCH 01/10/2016 32.1  26.0 - 34.0 pg Final  . MCHC 01/10/2016 33.7  32.0 - 36.0 g/dL Final  . RDW 01/10/2016 20.2* 11.5 - 14.5 % Final  . Platelets 01/10/2016 230  150 - 440 K/uL Final  . Neutrophils Relative % 01/10/2016 64  % Final  . Neutro Abs 01/10/2016 1.5  1.4 - 6.5 K/uL  Final  . Lymphocytes Relative 01/10/2016 27  % Final  . Lymphs Abs 01/10/2016 0.6* 1.0 - 3.6 K/uL Final  . Monocytes Relative 01/10/2016 6  % Final  . Monocytes Absolute 01/10/2016 0.1* 0.2 - 0.9 K/uL Final  . Eosinophils Relative 01/10/2016 4  % Final  . Eosinophils Absolute 01/10/2016 0.1  0 - 0.7 K/uL Final  . Basophils Relative 01/10/2016 1  % Final  . Basophils Absolute 01/10/2016 0.0  0 - 0.1 K/uL Final  . Sodium 01/10/2016 139  135 - 145 mmol/L Final  . Potassium 01/10/2016 4.0  3.5 - 5.1 mmol/L Final  . Chloride 01/10/2016 105  101 - 111 mmol/L Final  . CO2 01/10/2016 25  22 - 32 mmol/L Final  . Glucose, Bld 01/10/2016 101* 65 - 99 mg/dL Final  . BUN 01/10/2016 15  6 - 20 mg/dL Final  . Creatinine, Ser 01/10/2016 0.60  0.44 - 1.00 mg/dL Final  . Calcium 01/10/2016 9.2  8.9 - 10.3 mg/dL Final  . Total Protein 01/10/2016 7.7  6.5 - 8.1 g/dL Final  . Albumin 01/10/2016 4.2  3.5 - 5.0 g/dL Final  . AST 01/10/2016 20  15 - 41 U/L Final  . ALT 01/10/2016 11* 14 - 54 U/L Final  . Alkaline Phosphatase 01/10/2016 64  38 - 126 U/L Final  . Total Bilirubin 01/10/2016 0.7  0.3 - 1.2 mg/dL Final  . GFR calc non Af Amer 01/10/2016 >60  >60 mL/min Final  . GFR calc Af Amer 01/10/2016 >60  >60 mL/min Final   Comment: (NOTE) The eGFR has been calculated using the CKD EPI equation. This calculation has not been validated in all clinical situations. eGFR's persistently <60 mL/min signify possible Chronic Kidney Disease.   . Anion gap 01/10/2016 9  5 - 15 Final  . Magnesium 01/10/2016 1.9  1.7 - 2.4 mg/dL Final    Assessment:  Carrieann Spielberg [name] is a 45 y.o. 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  She initially presented in 2009 while living in [place] with multi-focal right breast cancer with positive lymph node(s) which was ER+, PR+(low), and HER2/neu+. She received neoadjuvant chemotherapy (AC x 4 every 3 weeks followed by Taxol/Abraxane + carboplatin weekly x  12) without anti-HER2 treatment.    On 07/14/2008, she underwent bilateral mastectomies followed by reconstruction.  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   Zero of 11 lymph nodes on the right were positive for malignancy. 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 Deep margin was negative. Zero of 2 lymph nodes were positive for metastatic disease.  She received adjuvant radiation to the right breast.  She received adjuvant  tamoxifen and Zoladex.  She could not tolerate symptoms of joint pain and tamoxifen was discontinued after 1-2 months.  Zoladex was continued for approximately 1.5 years.  She was diagnosed with brain metastases in 02/2011.  Pathology revealed ER+, PR+(low), and HER2/neu-.  She underwent resection followed by [facility].  On 03/09/2011, original breast tissue (09/03/2007) sent to [facility] testing.  Testing revealed ER+, PR+(borderline), and HER2/neu positive (different than initial testing).  She received Herceptin for 1 year beginning in 2013.  She then began Tykerb and Xeloda after completion of Herceptin (2014).  She was on extremely low, and probably sub-therapeutic, dosing of lapatinib + capecitabine.   She developed right supraclavicular adenopathy in 2015. She was noted to have slow disease progression in the right frontal/parietal lesion with minimal presence of systemic disease on PET scans. Capecitabine + lapatinib were discontinued in 04/2014.   She underwent repeat craniotomy with resection of brain metastasis on 09/04/2014.  Biopsy of the right supraclavicular lymph node  was ER+,PR+,HER2/neu-.  She started tamoxifen in 09/2014, but discontinued it secondary to pain in hip, back, and shoulder.  PET scan on 04/20/2015 revealed cervical and thoracic nodal metastasis.  There was multifocal osseous metastasis (left transverse T4 process, left humeral head, right iliac wing, and left side of the sacrum).   Incidental findings included right nephrolithiasis.    CA27.29 was 177.8 on 04/16/2015, 276.4 on 05/28/2015, 287.5 on 07/13/2015, 333.8 on 07/26/2015, 412.9 on 08/23/2015, 315.6 on 09/20/2015, 188.8 on 10/18/2015, 151.5 on 11/15/2015, 122.7 on 12/10/2015, and 94.6 on 01/10/2016.  Bone scan on 04/22/2015 corresponded with PET-CT with metastatic lesions evident in the left humeral head, left posterior aspect of T4, left aspect of S1, and the right iliac crest.  There was no other definite foci of metastatic disease to bone.  Uptake in the skull was most suggestive of the previous right frontal craniotomy.  Bone density study on 05/24/2015 revealed a T-score of -0.8 in the right femoral neck (normal).  She restarted tamoxifen on 04/23/2015.  Tamoxifen was discontinued on 08/23/2015 secondary to progressive disease.  She received Zoladex on 04/30/2015.  She underwent laparoscopic bilateral oophorectomy on 05/18/2015.  She receives monthly Xgeva (began 04/30/2015; last 12/13/2015).  She was diagnosed with a Proteus mirabilis UTI on 07/13/2015 and was treated with a course of ciprofloxacin.  Head MRI on 07/07/2015 revealed metastatic breast cancer with 3 resection cavities. The anterior right frontal cavity was positive for nodular marginal enhancement, new from brain MRI report 01/07/2015, and consistent with recurrent disease.  She received 30 Gy from 08/05/2015 - 08/19/2015 to the CNS.  Head MRI on  12/06/2015 revealed decreased nodular contrast enhancement at the right frontal lobe treatment site.  There was unchanged appearance of treatment sites within the bilateral parietal lobe size without residual or recurrent disease.  There were no new metastatic lesions.  Lumbar spine MRI on 08/12/2015 revealed osseous metastatic disease at L3, L4, L5, S1, and in the left iliac bone posteriorly. There was no definite epidural metastatic disease.  PET scan on 08/20/2015 revealed a response to therapy in the  lower cervical and thoracic adenopathy.  There was a mixed response to multifocal osseous metastases (some new lesions).   She began Faslodex on 08/23/2015 (last 12/13/2015).  She is currently day 17 of cycle #3  Ibrance (09/20/2015 - 12/25/2015).  Patient received palliative radiation 30 Gy to the left humerus and lumbar spine from 08/25/2015 - 09/09/2015  She has a normocytic anemia due to treatment [facility],  radiation) and B12 deficiency.  Work-up on 10/25/2015 revealed a B12 of 141 (low).  Ferritin was 43. Iron studies included a saturation of 13% and a TIBC of 354. TSH and folate were normal.  Reticulocyte count was 2.2%.  She declined B12 injections.  She started oral B12 1000 mcg on 11/05/2015.  She takes her B12 sporadically.  B12 level was 205 (low normal) on 12/13/2015.  Diet is good.  She denies any melena or hematochezia.  Symptomatically, she denies any pain.  She has some mild epigastric discomfort.  She is on Protonix.  She has mild leukopenia (WBC 2400; ANC 1500) at the end of her third cycle of Ibrance. She receives Faslodex and Xegva monthly.  Calcium is 9.2.  Plan: 1.  Labs today:  CBC with diff, CMP, MMA, CA27.29. 2.  Xgeva today. 3.  Faslodex today. 4.  RTC on 11/06 for CBC with diff 5.  Continue 3 weeks on/1 week off.  Ensure counts good on day 1 of cycle #4 Ibrance. 6.  Schedule PET scan on 02/18/2016 7.  Anticipate next head MRI on 03/06/2016. 8.  RTC in 4 weeks for MD assessment, labs (CBC with diff, CMP, CA27.29, Mg, MMA), Xgeva and Faslodex.   Lequita Asal, MD  01/10/2016, 2:22 PM

## 2016-01-10 ENCOUNTER — Inpatient Hospital Stay (HOSPITAL_BASED_OUTPATIENT_CLINIC_OR_DEPARTMENT_OTHER): Payer: Medicare HMO | Admitting: Hematology and Oncology

## 2016-01-10 ENCOUNTER — Inpatient Hospital Stay: Payer: Medicare HMO

## 2016-01-10 ENCOUNTER — Other Ambulatory Visit: Payer: Self-pay | Admitting: *Deleted

## 2016-01-10 VITALS — BP 123/87 | HR 89 | Temp 98.1°F | Resp 18 | Wt 156.7 lb

## 2016-01-10 DIAGNOSIS — C50911 Malignant neoplasm of unspecified site of right female breast: Secondary | ICD-10-CM

## 2016-01-10 DIAGNOSIS — Z170 Estrogen receptor positive status [ER+]: Secondary | ICD-10-CM | POA: Diagnosis not present

## 2016-01-10 DIAGNOSIS — E538 Deficiency of other specified B group vitamins: Secondary | ICD-10-CM

## 2016-01-10 DIAGNOSIS — C7931 Secondary malignant neoplasm of brain: Secondary | ICD-10-CM | POA: Diagnosis not present

## 2016-01-10 DIAGNOSIS — Z9221 Personal history of antineoplastic chemotherapy: Secondary | ICD-10-CM

## 2016-01-10 DIAGNOSIS — C7951 Secondary malignant neoplasm of bone: Secondary | ICD-10-CM

## 2016-01-10 DIAGNOSIS — D72819 Decreased white blood cell count, unspecified: Secondary | ICD-10-CM

## 2016-01-10 DIAGNOSIS — D649 Anemia, unspecified: Secondary | ICD-10-CM

## 2016-01-10 DIAGNOSIS — Z923 Personal history of irradiation: Secondary | ICD-10-CM

## 2016-01-10 DIAGNOSIS — R140 Abdominal distension (gaseous): Secondary | ICD-10-CM

## 2016-01-10 DIAGNOSIS — Z79899 Other long term (current) drug therapy: Secondary | ICD-10-CM

## 2016-01-10 DIAGNOSIS — C50919 Malignant neoplasm of unspecified site of unspecified female breast: Secondary | ICD-10-CM

## 2016-01-10 LAB — CBC WITH DIFFERENTIAL/PLATELET
Basophils Absolute: 0 10*3/uL (ref 0–0.1)
Basophils Relative: 1 %
Eosinophils Absolute: 0.1 10*3/uL (ref 0–0.7)
Eosinophils Relative: 4 %
HCT: 30.7 % — ABNORMAL LOW (ref 35.0–47.0)
Hemoglobin: 10.4 g/dL — ABNORMAL LOW (ref 12.0–16.0)
Lymphocytes Relative: 27 %
Lymphs Abs: 0.6 10*3/uL — ABNORMAL LOW (ref 1.0–3.6)
MCH: 32.1 pg (ref 26.0–34.0)
MCHC: 33.7 g/dL (ref 32.0–36.0)
MCV: 95.3 fL (ref 80.0–100.0)
Monocytes Absolute: 0.1 10*3/uL — ABNORMAL LOW (ref 0.2–0.9)
Monocytes Relative: 6 %
Neutro Abs: 1.5 10*3/uL (ref 1.4–6.5)
Neutrophils Relative %: 64 %
Platelets: 230 10*3/uL (ref 150–440)
RBC: 3.22 MIL/uL — ABNORMAL LOW (ref 3.80–5.20)
RDW: 20.2 % — ABNORMAL HIGH (ref 11.5–14.5)
WBC: 2.4 10*3/uL — ABNORMAL LOW (ref 3.6–11.0)

## 2016-01-10 LAB — COMPREHENSIVE METABOLIC PANEL
ALT: 11 U/L — ABNORMAL LOW (ref 14–54)
AST: 20 U/L (ref 15–41)
Albumin: 4.2 g/dL (ref 3.5–5.0)
Alkaline Phosphatase: 64 U/L (ref 38–126)
Anion gap: 9 (ref 5–15)
BUN: 15 mg/dL (ref 6–20)
CO2: 25 mmol/L (ref 22–32)
Calcium: 9.2 mg/dL (ref 8.9–10.3)
Chloride: 105 mmol/L (ref 101–111)
Creatinine, Ser: 0.6 mg/dL (ref 0.44–1.00)
GFR calc Af Amer: >60 mL/min (ref >60)
GFR calc non Af Amer: >60 mL/min (ref >60)
Glucose, Bld: 101 mg/dL — ABNORMAL HIGH (ref 65–99)
Potassium: 4 mmol/L (ref 3.5–5.1)
Sodium: 139 mmol/L (ref 135–145)
Total Bilirubin: 0.7 mg/dL (ref 0.3–1.2)
Total Protein: 7.7 g/dL (ref 6.5–8.1)

## 2016-01-10 LAB — MAGNESIUM: Magnesium: 1.9 mg/dL (ref 1.7–2.4)

## 2016-01-10 MED ORDER — DENOSUMAB 120 MG/1.7ML ~~LOC~~ SOLN
120.0000 mg | Freq: Once | SUBCUTANEOUS | Status: AC
  Administered 2016-01-10: 120 mg via SUBCUTANEOUS
  Filled 2016-01-10: qty 1.7

## 2016-01-10 MED ORDER — FULVESTRANT 250 MG/5ML IM SOLN
500.0000 mg | INTRAMUSCULAR | Status: DC
  Administered 2016-01-10: 500 mg via INTRAMUSCULAR
  Filled 2016-01-10: qty 10

## 2016-01-10 NOTE — Progress Notes (Signed)
Patient continues to be tender in her mid abdomen when she presses there.  Otherwise, no complaints.

## 2016-01-11 LAB — CANCER ANTIGEN 27.29: CA 27.29: 94.6 U/mL — ABNORMAL HIGH (ref 0.0–38.6)

## 2016-01-13 LAB — METHYLMALONIC ACID, SERUM: Methylmalonic Acid, Quantitative: 171 nmol/L (ref 0–378)

## 2016-01-24 ENCOUNTER — Inpatient Hospital Stay: Payer: Medicare HMO | Attending: Hematology and Oncology

## 2016-01-24 DIAGNOSIS — D649 Anemia, unspecified: Secondary | ICD-10-CM | POA: Diagnosis not present

## 2016-01-24 DIAGNOSIS — C50911 Malignant neoplasm of unspecified site of right female breast: Secondary | ICD-10-CM

## 2016-01-24 DIAGNOSIS — Z170 Estrogen receptor positive status [ER+]: Secondary | ICD-10-CM | POA: Insufficient documentation

## 2016-01-24 DIAGNOSIS — C7931 Secondary malignant neoplasm of brain: Secondary | ICD-10-CM | POA: Insufficient documentation

## 2016-01-24 DIAGNOSIS — Z79818 Long term (current) use of other agents affecting estrogen receptors and estrogen levels: Secondary | ICD-10-CM | POA: Diagnosis not present

## 2016-01-24 DIAGNOSIS — C7951 Secondary malignant neoplasm of bone: Secondary | ICD-10-CM | POA: Insufficient documentation

## 2016-01-24 DIAGNOSIS — Z9221 Personal history of antineoplastic chemotherapy: Secondary | ICD-10-CM | POA: Insufficient documentation

## 2016-01-24 DIAGNOSIS — Z9013 Acquired absence of bilateral breasts and nipples: Secondary | ICD-10-CM | POA: Diagnosis not present

## 2016-01-24 DIAGNOSIS — E538 Deficiency of other specified B group vitamins: Secondary | ICD-10-CM | POA: Diagnosis not present

## 2016-01-24 DIAGNOSIS — Z923 Personal history of irradiation: Secondary | ICD-10-CM | POA: Diagnosis not present

## 2016-01-24 LAB — CBC WITH DIFFERENTIAL/PLATELET
Basophils Absolute: 0.1 10*3/uL (ref 0–0.1)
Basophils Relative: 3 %
Eosinophils Absolute: 0 10*3/uL (ref 0–0.7)
Eosinophils Relative: 1 %
HCT: 32.1 % — ABNORMAL LOW (ref 35.0–47.0)
Hemoglobin: 10.6 g/dL — ABNORMAL LOW (ref 12.0–16.0)
Lymphocytes Relative: 21 %
Lymphs Abs: 0.8 10*3/uL — ABNORMAL LOW (ref 1.0–3.6)
MCH: 32.7 pg (ref 26.0–34.0)
MCHC: 33.1 g/dL (ref 32.0–36.0)
MCV: 98.7 fL (ref 80.0–100.0)
Monocytes Absolute: 0.5 10*3/uL (ref 0.2–0.9)
Monocytes Relative: 13 %
Neutro Abs: 2.4 10*3/uL (ref 1.4–6.5)
Neutrophils Relative %: 62 %
Platelets: 299 10*3/uL (ref 150–440)
RBC: 3.25 MIL/uL — ABNORMAL LOW (ref 3.80–5.20)
RDW: 22 % — ABNORMAL HIGH (ref 11.5–14.5)
WBC: 3.9 10*3/uL (ref 3.6–11.0)

## 2016-02-06 ENCOUNTER — Encounter: Payer: Self-pay | Admitting: Hematology and Oncology

## 2016-02-06 NOTE — Progress Notes (Addendum)
[facility] day:  02/07/16   Chief Complaint: Melinda Tanner is a 45 y.o. 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   HPI:  The patient was last seen in the medical oncology clinic on 01/10/2016.  At that time, she was doing well.  She denied any pain.  She had some mild epigastric discomfort..  She was taking Protonix.  She had mild leukopenia (WBC 2400; ANC 1500) at the end of her 3rd cycle of Ibrance. Calcium was 9.2.  She received Xgeva and Faslodex.    CBC on 01/24/2016 revealed a hematocrit of 32.1, hemoglobin 10.6, MCV 98.7, platelets 299,000, WBC 3900 with an ANC of 2400.  Symptomatically, she has done well. She denies any problems. She notes sometimes she feels bloated. She denies any pain. With the change in weather, she has some discomfort in her back and shoulders.  Day 1 was to have been 01/24/2016.  She did not talk to the nurse about her counts.   Past Medical History:  Diagnosis Date  . Brain cancer ([place]) 2012   Met. from Breast  . Breast cancer ([place]) 2009  . Complication of anesthesia    nausea, "drops in potassium and magnesium"  . Seizures ([place])     Past Surgical History:  Procedure Laterality Date  . BRAIN SURGERY  2012, 2106  . CESAREAN SECTION    . LAPAROSCOPIC BILATERAL SALPINGO OOPHERECTOMY Bilateral 05/18/2015   Procedure: LAPAROSCOPIC BILATERAL SALPINGO OOPHORECTOMY;  Surgeon: Will Bonnet, MD;  Location: [facility] ORS;  Service: Gynecology;  Laterality: Bilateral;  . MASTECTOMY Bilateral 2010    Family History  Problem Relation Age of Onset  . Cancer Paternal Aunt   . Cancer Paternal Uncle   . Cancer Paternal Grandfather   . Hypertension Brother   . Diabetes Paternal Grandmother   A paternal aunt had breast cancer age 37, a paternal uncle had prostate cancer, and a  paternal grandfather had leukemia.  Social History:  reports that she has never smoked. She does not have any smokeless tobacco history on file. She reports that she does not drink alcohol or use drugs.  She is from [place].  She moved to [place] in 2015.  She moved to [place] in 04/2014.  She recently moved into the [place] area.  She has 2 children (boy and girl).  Her family will likely be moving to [facility] in December.  They are looking for a home.  The patient is accompanied by the Spanish interpreter today.  Allergies:  Allergies  Allergen Reactions  . Taxol [Paclitaxel] Anaphylaxis  . Aspirin Swelling  . Penicillins Swelling  . Zofran [Ondansetron Hcl] Nausea And Vomiting    Current Medications: Current Outpatient Prescriptions  Medication Sig Dispense Refill  . Cyanocobalamin (VITAMIN B-12 PO) Take by mouth.    . dexamethasone (DECADRON) 2 MG tablet Take 1 tablet (2 mg total) by mouth daily. Take 1 tablet daily x 1 week and then take 1 tablet every other day for 1 week , then stop taking 11 tablet 0  . omeprazole (PRILOSEC) 20 MG capsule Take 20 mg by mouth daily.  2  . oxycodone (OXY-IR) 5 MG capsule Take 1 capsule (5 mg total) by mouth every 4 (four) hours as needed. 30 capsule 0  . pantoprazole (PROTONIX) 20 MG tablet TK 1  T PO D  3  . potassium chloride SA (K-DUR,KLOR-CON) 20 MEQ tablet Take 1 tablet (20 mEq total) by mouth 2 (two) times daily. for 1 days then 1 pill a day x 2 days. 10 tablet 0  . palbociclib (IBRANCE) 100 MG capsule Take 1 capsule daily for 21 days and then take 1 week off and repeat cycle again (Take whole with food.) (Patient not taking: Reported on 02/07/2016) 21 capsule 1   No current facility-administered medications for this visit.     Review of Systems:  GENERAL:  Feels good.  No fevers or sweats.  Weight up 1 pound. PERFORMANCE STATUS (ECOG):  1 HEENT:  No visual changes, runny nose, sore throat, mouth sores or  tenderness. Lungs: No shortness of breath or cough.  No hemoptysis. Cardiac:  No chest pain, palpitations, orthopnea, or PND. GI:  Appetite good.  Sometimes feels bloated. Taking Protonix.  No nausea, vomiting, diarrhea, constipation, melena or hematochezia. GU:  No urgency, frequency, dysuria or hematuria. Musculoskeletal: Back and shoulder discomfort secondary to change in weather. No joint pain.  No muscle tenderness. Extremities:  No pain or swelling. Skin:  No rashes or irritation. Neuro:  No headache, numbness or weakness, balance or coordination issues.  Endocrine:  No diabetes, thyroid issues, hot flashes or night sweats. Psych:  No mood changes, depression or anxiety. Pain:  No focal pain. Review of systems:  All other systems reviewed and found to be negative.  Physical Exam: Blood pressure 117/74, pulse 88, temperature 97.3 F (36.3 C), temperature source Tympanic, weight 157 lb 15.4 oz (71.6 kg), last menstrual period 03/19/2015. GENERAL:  Well developed, well nourished, woman sitting comfortably in the exam room in no acute distress.  MENTAL STATUS:  Alert and oriented to person, place and time. HEAD: Wearing a pink cap.  Normocephalic, atraumatic, face symmetric, no Cushingoid features. EYES:  Brown eyes. Pupils equal round and reactive to light and accomodation.  No conjunctivitis or scleral icterus. ENT:  Oropharynx clear without lesion.  Tongue normal. Mucous membranes moist.  RESPIRATORY:  Clear to auscultation without rales, wheezes or rhonchi. CARDIOVASCULAR:  Regular rate and rhythm without murmur, rub or gallop. ABDOMEN:  Soft, non-tender, with active bowel sounds, and no hepatosplenomegaly.  No masses. SKIN:  No rashes, bruises or ulcers. EXTREMITIES:  No edema, no skin discoloration or tenderness.  No palpable cords. LYMPH NODES:  No palpable cervical, supraclavicular, axillary or inguinal adenopathy  NEUROLOGICAL:  Appropriate. PSYCH:  Appropriate.      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 No lymph invasion. Tumor cellularity 20%. [id number]. 07/13/2008: Left mastectomy: Residual ductal carcinoma in situ, high nuclear grade, deep margin negative ypTisNO(sn)MX. 10/25/2010: Abdominal and pelvic ultrasound: questionable lesion near gallbladder fossa recommend MRI follow up. 03/02/2011: Left and right frontal excisional brain biopsy: Adenocarcinoma, metastatic, NOS: ER +, PR 5% +, Her 2 NEG. 03/04/2011: [facility] testing of IHC4 residual recurrence risk score of 114 showing an 8 year recurrence reate of 57%. ER POSTIVE, PR POSITIVE, HER 2 POSTIVE (previously documented negative in 2009) cutoff of 361 patient scores 426. ki67 71%.  08/05/2013: Right breast FNA supraclavicular region: positive for metastatic carcinoma. ER/PR/HER2 not reported. 08/19/2014: Right cervical lymph node.  Adenocarcinoma with features consistent with metastatic breast carcinoma. ER/PR/HER2 insufficient tissue. 09/04/2014: Repeat craniotomy, resection of right frontal tumor (metastatic breast cancer). ER 90-100% PR 51-60% HER2 - 09/29/2014: Right cervical lymph node, metastatic carcinoma c/w breast primary, ER 100% PR 20% HER2-  Imaging: 08/15/2007: Bilateral Mammogram: Dense breasts with solid lesion at 6:00, 7:00, and 9:00 of right breast, solid lesion at 2:00 position left breast BIRADS 4B. 09/10/2007: Breast MRI: Three solid irregular enhancing lesions of the right breast 2.1 x 3.2, 2.8 x 2.7, and 1.8 x 1.6 cm. Mildly enlarged enhancing nodule right axilla. Left breast clumped enhancement midportion suspicious for  malignancy. 03/01/2011: Brain MRI: At least two juxtacortical, intra-axial masses with extensive surrounding vasogenic edema most consistent with intracranial metastasis. 07/22/2013: Brain MRI +/- contrast: interval increase in enhancing component on T2 FLAIR now measuring 1.0cm x 1.1 cm worrisome for progression of disease. 01/21/2014:  PET scan: Hypermetabolic small right supraclavicular and right upper paratracheal lymph nodes suspicious for mets. Hypermetabolic lymph node in the right paratracheal upper mediastinum that measure approximately 0.9 x 0.5cm. (of note no impressions made of areas noted on brain MRI 07/22/13).  01/21/2014: Head MRI: Right frontal enhancing lesion has shown interval increase in size with surrounding edema and local mass effect (measured 1.8x2.1x1.6, previously 1.2x1.1x1.0) 07/16/2014: Head MRI:  Right anterior frontal enhancing mass 2.5 x 2.0 cm compatible with a metastatic lesion has increased and changed in configuration from previous 2.0 x 1.8 cm, formerly multilobular. 08/06/2014: PET scan:  Multiple bilateral FDG avid low cervical, supraclavicular, upper mediastinal, and right internal mammary lymphadenopathy, likely representing metastatic disease. A cervical node in the right lower neck should be amenable to percutaneous sampling.  12/2014: Head MRI:  Evolution of right frontal postoperative changes status post tumor resection at the vertex. Expected postoperative changes. Minimal marginal enhancement resection bed with some adjacent posterior intrinsic T1 signal again seen. Decreasing T2/FLAIR adjacent parenchymal changes.  Stable resection cavity is left posterior frontal and right parieto-occipital regions. 04/20/2015 : PET scan: Cervical and thoracic nodal metastasis.  There was multifocal osseous metastasis (left transverse T4 process, left humeral head, right iliac wing, and left side of the sacrum).  Incidental findings included right nephrolithiasis.   04/22/2015:  Bone scan: Corresponded with PET-CT with metastatic lesions evident in the left humeral head, left posterior aspect of T4, left aspect of S1, and the right iliac crest.  There was no other definite foci of metastatic disease to bone.  Uptake in the skull was most suggestive of the previous right frontal craniotomy. 05/24/2015: Bone density:  T-score of -0.8 in the right femoral neck (normal). 07/07/2015:  Head MRI:  Metastatic breast cancer with 3 resection cavities. The anterior right frontal cavity was positive for nodular marginal enhancement, new from brain MRI report 01/07/2015, and consistent with recurrent disease.  08/12/2015:  Lumbar Spine MRI: Osseous metastatic disease at L3, L4, L5, S1, and in the left iliac bone posteriorly. There was no definite epidural metastatic disease. 08/20/2015:  PET scan:  Response to therapy in the lower cervical and thoracic adenopathy.  There was a mixed response to multifocal osseous metastases (some new lesions).  12/06/2015:  Head MRI:  Decreased nodular contrast enhancement at the right frontal lobe treatment site.  There was unchanged appearance of treatment sites within the bilateral parietal lobe size without residual or recurrent disease.  There were no new metastatic lesions.  Labs:  Appointment on 02/07/2016  Component Date Value Ref Range Status  . WBC 02/07/2016 5.9  3.6 - 11.0 K/uL Final  . RBC 02/07/2016 3.45* 3.80 - 5.20 MIL/uL Final  . Hemoglobin 02/07/2016 11.1* 12.0 - 16.0 g/dL Final  . HCT 02/07/2016 32.8* 35.0 - 47.0 % Final  . MCV 02/07/2016 94.9  80.0 - 100.0 fL Final  . MCH 02/07/2016 32.1  26.0 - 34.0 pg Final  . MCHC 02/07/2016 33.8  32.0 - 36.0 g/dL Final  . RDW 02/07/2016 19.7* 11.5 - 14.5 % Final  . Platelets 02/07/2016 342  150 - 440 K/uL Final  . Neutrophils Relative % 02/07/2016 70  % Final  . Neutro Abs 02/07/2016 4.1  1.4 - 6.5 K/uL Final  . Lymphocytes Relative 02/07/2016 19  % Final  . Lymphs Abs 02/07/2016 1.1  1.0 -  3.6 K/uL Final  . Monocytes Relative 02/07/2016 7  % Final  . Monocytes Absolute 02/07/2016 0.4  0.2 - 0.9 K/uL Final  . Eosinophils Relative 02/07/2016 3  % Final  . Eosinophils Absolute 02/07/2016 0.2  0 - 0.7 K/uL Final  . Basophils Relative 02/07/2016 1  % Final  . Basophils Absolute 02/07/2016 0.1  0 - 0.1 K/uL Final  . Sodium 02/07/2016 140  135 - 145 mmol/L Final  . Potassium 02/07/2016 3.7  3.5 - 5.1 mmol/L Final  . Chloride 02/07/2016 105  101 - 111 mmol/L Final  . CO2 02/07/2016 28  22 - 32 mmol/L Final  . Glucose, Bld 02/07/2016 104* 65 - 99 mg/dL Final  . BUN 02/07/2016 11  6 - 20 mg/dL Final  . Creatinine, Ser 02/07/2016 0.54  0.44 - 1.00 mg/dL Final  . Calcium 02/07/2016 9.5  8.9 - 10.3 mg/dL Final  . Total Protein 02/07/2016 7.5  6.5 - 8.1 g/dL Final  . Albumin 02/07/2016 4.2  3.5 - 5.0 g/dL Final  . AST 02/07/2016 23  15 - 41 U/L Final  . ALT 02/07/2016 13* 14 - 54 U/L Final  . Alkaline Phosphatase 02/07/2016 64  38 - 126 U/L Final  . Total Bilirubin 02/07/2016 0.6  0.3 - 1.2 mg/dL Final  . GFR calc non Af Amer 02/07/2016 >60  >60 mL/min Final  . GFR calc Af Amer 02/07/2016 >60  >60 mL/min Final   Comment: (NOTE) The eGFR has been calculated using the CKD EPI equation. This calculation has not been validated in all clinical situations. eGFR's persistently <60 mL/min signify possible Chronic Kidney Disease.   . Anion gap 02/07/2016 7  5 - 15 Final  . Magnesium 02/07/2016 2.0  1.7 - 2.4 mg/dL Final    Assessment:  Valeria Boza [name] is a 45 y.o. 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  She initially presented in 2009 while living in [place] with multi-focal right breast cancer with positive lymph node(s) which was ER+, PR+(low), and HER2/neu+. She received neoadjuvant chemotherapy (AC x 4 every 3 weeks followed by Taxol/Abraxane + carboplatin weekly x 12) without anti-HER2 treatment.    On 07/14/2008, she underwent bilateral mastectomies  followed by reconstruction.  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   Zero of 11 lymph nodes on the right were positive for malignancy. 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 Deep margin was negative. Zero of 2 lymph nodes were positive for metastatic disease.  She received adjuvant radiation to the right breast.  She received adjuvant tamoxifen and Zoladex.  She could not tolerate symptoms of joint pain and tamoxifen was discontinued after 1-2 months.  Zoladex was continued for approximately 1.5 years.  She was diagnosed with brain metastases in 02/2011.  Pathology revealed ER+, PR+(low), and HER2/neu-.  She underwent resection followed by [facility].  On 03/09/2011, original breast tissue (09/03/2007) sent to [facility] testing.  Testing revealed ER+, PR+(borderline), and HER2/neu positive (different than initial testing).  She received Herceptin for 1 year beginning in 2013.  She then began Tykerb and Xeloda after completion of Herceptin (2014).  She was on extremely low, and probably sub-therapeutic, dosing of lapatinib + capecitabine.   She developed right supraclavicular adenopathy in 2015. She was noted to have slow disease progression in the right frontal/parietal lesion with minimal presence of systemic disease on PET scans. Capecitabine + lapatinib were discontinued in 04/2014.   She underwent repeat craniotomy with resection of brain metastasis on 09/04/2014.  Biopsy of the right supraclavicular lymph node  was ER+,PR+,HER2/neu-.  She started tamoxifen in 09/2014, but discontinued it secondary to pain in hip, back, and shoulder.  PET scan on 04/20/2015 revealed cervical and thoracic nodal metastasis.  There was multifocal osseous metastasis (left transverse T4 process, left humeral head, right iliac wing, and left side of the sacrum).  Incidental findings included right nephrolithiasis.    CA27.29 was 177.8 on 04/16/2015, 276.4  on 05/28/2015, 287.5 on 07/13/2015, 333.8 on 07/26/2015, 412.9 on 08/23/2015, 315.6 on 09/20/2015, 188.8 on 10/18/2015, 151.5 on 11/15/2015, 122.7 on 12/10/2015, 94.6 on 01/10/2016, and 80.9 on 02/07/2016.  Bone scan on 04/22/2015 corresponded with PET-CT with metastatic lesions evident in the left humeral head, left posterior aspect of T4, left aspect of S1, and the right iliac crest.  There was no other definite foci of metastatic disease to bone.  Uptake in the skull was most suggestive of the previous right frontal craniotomy.  Bone density study on 05/24/2015 revealed a T-score of -0.8 in the right femoral neck (normal).  She restarted tamoxifen on 04/23/2015.  Tamoxifen was discontinued on 08/23/2015 secondary to progressive disease.  She received Zoladex on 04/30/2015.  She underwent laparoscopic bilateral oophorectomy on 05/18/2015.  She receives monthly Xgeva (began 04/30/2015; last 01/10/2016).  She was diagnosed with a Proteus mirabilis UTI on 07/13/2015 and was treated with a course of ciprofloxacin.  Head MRI on 07/07/2015 revealed metastatic breast cancer with 3 resection cavities. The anterior right frontal cavity was positive for nodular marginal enhancement, new from brain MRI report 01/07/2015, and consistent with recurrent disease.  She received 30 Gy from 08/05/2015 - 08/19/2015 to the CNS.  Head MRI on  12/06/2015 revealed decreased nodular contrast enhancement at the right frontal lobe treatment site.  There was unchanged appearance of treatment sites within the bilateral parietal lobe size without residual or recurrent disease.  There were no new metastatic lesions.  Lumbar spine MRI on 08/12/2015 revealed osseous metastatic disease at L3, L4, L5, S1, and in the left iliac bone posteriorly. There was no definite epidural metastatic disease.  PET scan on 08/20/2015 revealed a response to therapy in the lower cervical and thoracic adenopathy.  There was a mixed response to  multifocal osseous metastases (some new lesions).   She began Faslodex on 08/23/2015 (last 01/10/2016).  She is currently day 1 of cycle #4  Ibrance (09/20/2015 - 12/25/2015).  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  Work-up on 10/25/2015 revealed a B12 of 141 (low).  B12 was  205 on 12/13/2015 with an MMA of 171 (normal) on 01/10/2016.  Ferritin was 43. Iron studies included a saturation of 13% and a TIBC of 354. TSH and folate were normal.  Reticulocyte count was 2.2%.  She declined B12 injections.  She started oral B12 1000 mcg on 11/05/2015.  She takes her B12 sporadically.  B12 level was 205 (low normal) on 12/13/2015.  Diet is good.  She denies any melena or hematochezia.  Symptomatically, she feels good.  She denies any pain. Exam is stable.   Plan: 1.  Labs today:  CBC with diff, CMP, CA27.29. 2.  Xgeva today. 3.  Faslodex today. 4.  PET scan on 02/18/2016. 5.  Schedule head MRI on 03/06/2016. 6.  RTC in 2 weeks for CBC with diff  7.  RTC in 1 month for MD assessment, labs (CBC with diff, CMP, B12, CA27.29), Xgeva + Faslodex.   Lequita Asal, MD  02/07/2016, 2:05 PM

## 2016-02-07 ENCOUNTER — Encounter: Payer: Self-pay | Admitting: Hematology and Oncology

## 2016-02-07 ENCOUNTER — Inpatient Hospital Stay: Payer: Medicare HMO

## 2016-02-07 ENCOUNTER — Inpatient Hospital Stay (HOSPITAL_BASED_OUTPATIENT_CLINIC_OR_DEPARTMENT_OTHER): Payer: Medicare HMO | Admitting: Hematology and Oncology

## 2016-02-07 DIAGNOSIS — C7951 Secondary malignant neoplasm of bone: Secondary | ICD-10-CM

## 2016-02-07 DIAGNOSIS — Z923 Personal history of irradiation: Secondary | ICD-10-CM

## 2016-02-07 DIAGNOSIS — C7931 Secondary malignant neoplasm of brain: Secondary | ICD-10-CM

## 2016-02-07 DIAGNOSIS — C50911 Malignant neoplasm of unspecified site of right female breast: Secondary | ICD-10-CM

## 2016-02-07 DIAGNOSIS — Z9221 Personal history of antineoplastic chemotherapy: Secondary | ICD-10-CM

## 2016-02-07 DIAGNOSIS — D649 Anemia, unspecified: Secondary | ICD-10-CM

## 2016-02-07 DIAGNOSIS — C50919 Malignant neoplasm of unspecified site of unspecified female breast: Secondary | ICD-10-CM

## 2016-02-07 DIAGNOSIS — Z170 Estrogen receptor positive status [ER+]: Secondary | ICD-10-CM

## 2016-02-07 DIAGNOSIS — Z79818 Long term (current) use of other agents affecting estrogen receptors and estrogen levels: Secondary | ICD-10-CM

## 2016-02-07 DIAGNOSIS — Z9013 Acquired absence of bilateral breasts and nipples: Secondary | ICD-10-CM

## 2016-02-07 DIAGNOSIS — E538 Deficiency of other specified B group vitamins: Secondary | ICD-10-CM

## 2016-02-07 LAB — CBC WITH DIFFERENTIAL/PLATELET
Basophils Absolute: 0.1 10*3/uL (ref 0–0.1)
Basophils Relative: 1 %
Eosinophils Absolute: 0.2 10*3/uL (ref 0–0.7)
Eosinophils Relative: 3 %
HCT: 32.8 % — ABNORMAL LOW (ref 35.0–47.0)
Hemoglobin: 11.1 g/dL — ABNORMAL LOW (ref 12.0–16.0)
Lymphocytes Relative: 19 %
Lymphs Abs: 1.1 10*3/uL (ref 1.0–3.6)
MCH: 32.1 pg (ref 26.0–34.0)
MCHC: 33.8 g/dL (ref 32.0–36.0)
MCV: 94.9 fL (ref 80.0–100.0)
Monocytes Absolute: 0.4 10*3/uL (ref 0.2–0.9)
Monocytes Relative: 7 %
Neutro Abs: 4.1 10*3/uL (ref 1.4–6.5)
Neutrophils Relative %: 70 %
Platelets: 342 10*3/uL (ref 150–440)
RBC: 3.45 MIL/uL — ABNORMAL LOW (ref 3.80–5.20)
RDW: 19.7 % — ABNORMAL HIGH (ref 11.5–14.5)
WBC: 5.9 10*3/uL (ref 3.6–11.0)

## 2016-02-07 LAB — COMPREHENSIVE METABOLIC PANEL
ALT: 13 U/L — ABNORMAL LOW (ref 14–54)
AST: 23 U/L (ref 15–41)
Albumin: 4.2 g/dL (ref 3.5–5.0)
Alkaline Phosphatase: 64 U/L (ref 38–126)
Anion gap: 7 (ref 5–15)
BUN: 11 mg/dL (ref 6–20)
CO2: 28 mmol/L (ref 22–32)
Calcium: 9.5 mg/dL (ref 8.9–10.3)
Chloride: 105 mmol/L (ref 101–111)
Creatinine, Ser: 0.54 mg/dL (ref 0.44–1.00)
GFR calc Af Amer: >60 mL/min (ref >60)
GFR calc non Af Amer: >60 mL/min (ref >60)
Glucose, Bld: 104 mg/dL — ABNORMAL HIGH (ref 65–99)
Potassium: 3.7 mmol/L (ref 3.5–5.1)
Sodium: 140 mmol/L (ref 135–145)
Total Bilirubin: 0.6 mg/dL (ref 0.3–1.2)
Total Protein: 7.5 g/dL (ref 6.5–8.1)

## 2016-02-07 LAB — MAGNESIUM: Magnesium: 2 mg/dL (ref 1.7–2.4)

## 2016-02-07 MED ORDER — FULVESTRANT 250 MG/5ML IM SOLN
500.0000 mg | INTRAMUSCULAR | Status: DC
  Administered 2016-02-07: 500 mg via INTRAMUSCULAR
  Filled 2016-02-07 (×2): qty 10

## 2016-02-07 MED ORDER — DENOSUMAB 120 MG/1.7ML ~~LOC~~ SOLN
120.0000 mg | Freq: Once | SUBCUTANEOUS | Status: AC
  Administered 2016-02-07: 120 mg via SUBCUTANEOUS
  Filled 2016-02-07: qty 1.7

## 2016-02-07 NOTE — Progress Notes (Signed)
Patient here today for follow up on breast cancer.

## 2016-02-08 LAB — CANCER ANTIGEN 27.29: CA 27.29: 80.9 U/mL — ABNORMAL HIGH (ref 0.0–38.6)

## 2016-02-18 ENCOUNTER — Other Ambulatory Visit: Payer: Self-pay | Admitting: Hematology and Oncology

## 2016-02-18 ENCOUNTER — Ambulatory Visit
Admission: RE | Admit: 2016-02-18 | Discharge: 2016-02-18 | Disposition: A | Discharge status: Home / Self Care | Payer: Medicare HMO | Source: Ambulatory Visit | Attending: Hematology and Oncology | Admitting: Hematology and Oncology

## 2016-02-18 DIAGNOSIS — C7951 Secondary malignant neoplasm of bone: Secondary | ICD-10-CM | POA: Diagnosis present

## 2016-02-18 DIAGNOSIS — M899 Disorder of bone, unspecified: Secondary | ICD-10-CM | POA: Diagnosis not present

## 2016-02-18 DIAGNOSIS — C50919 Malignant neoplasm of unspecified site of unspecified female breast: Secondary | ICD-10-CM | POA: Diagnosis not present

## 2016-02-18 DIAGNOSIS — C50911 Malignant neoplasm of unspecified site of right female breast: Secondary | ICD-10-CM | POA: Diagnosis not present

## 2016-02-18 DIAGNOSIS — R590 Localized enlarged lymph nodes: Secondary | ICD-10-CM | POA: Insufficient documentation

## 2016-02-18 LAB — GLUCOSE, CAPILLARY: Glucose-Capillary: 81 mg/dL (ref 65–99)

## 2016-02-18 MED ORDER — FLUDEOXYGLUCOSE F - 18 (FDG) INJECTION
12.5420 | Freq: Once | INTRAVENOUS | Status: AC | PRN
  Administered 2016-02-18: 12.542 via INTRAVENOUS

## 2016-02-21 ENCOUNTER — Telehealth: Payer: Self-pay | Admitting: *Deleted

## 2016-02-21 ENCOUNTER — Inpatient Hospital Stay: Payer: Medicare HMO | Attending: Hematology and Oncology

## 2016-02-21 DIAGNOSIS — E538 Deficiency of other specified B group vitamins: Secondary | ICD-10-CM | POA: Diagnosis not present

## 2016-02-21 DIAGNOSIS — Z9013 Acquired absence of bilateral breasts and nipples: Secondary | ICD-10-CM | POA: Diagnosis not present

## 2016-02-21 DIAGNOSIS — D649 Anemia, unspecified: Secondary | ICD-10-CM | POA: Insufficient documentation

## 2016-02-21 DIAGNOSIS — C7931 Secondary malignant neoplasm of brain: Secondary | ICD-10-CM | POA: Diagnosis not present

## 2016-02-21 DIAGNOSIS — Z170 Estrogen receptor positive status [ER+]: Secondary | ICD-10-CM | POA: Diagnosis not present

## 2016-02-21 DIAGNOSIS — C50911 Malignant neoplasm of unspecified site of right female breast: Secondary | ICD-10-CM | POA: Diagnosis present

## 2016-02-21 DIAGNOSIS — Z923 Personal history of irradiation: Secondary | ICD-10-CM | POA: Diagnosis not present

## 2016-02-21 DIAGNOSIS — Z79818 Long term (current) use of other agents affecting estrogen receptors and estrogen levels: Secondary | ICD-10-CM | POA: Insufficient documentation

## 2016-02-21 DIAGNOSIS — C7951 Secondary malignant neoplasm of bone: Secondary | ICD-10-CM | POA: Diagnosis not present

## 2016-02-21 DIAGNOSIS — Z9221 Personal history of antineoplastic chemotherapy: Secondary | ICD-10-CM | POA: Diagnosis not present

## 2016-02-21 LAB — CBC WITH DIFFERENTIAL/PLATELET
Basophils Absolute: 0 10*3/uL (ref 0–0.1)
Basophils Relative: 1 %
Eosinophils Absolute: 0.1 10*3/uL (ref 0–0.7)
Eosinophils Relative: 4 %
HCT: 33.5 % — ABNORMAL LOW (ref 35.0–47.0)
Hemoglobin: 11.2 g/dL — ABNORMAL LOW (ref 12.0–16.0)
Lymphocytes Relative: 32 %
Lymphs Abs: 0.8 10*3/uL — ABNORMAL LOW (ref 1.0–3.6)
MCH: 31.9 pg (ref 26.0–34.0)
MCHC: 33.5 g/dL (ref 32.0–36.0)
MCV: 95 fL (ref 80.0–100.0)
Monocytes Absolute: 0.1 10*3/uL — ABNORMAL LOW (ref 0.2–0.9)
Monocytes Relative: 5 %
Neutro Abs: 1.5 10*3/uL (ref 1.4–6.5)
Neutrophils Relative %: 58 %
Platelets: 290 10*3/uL (ref 150–440)
RBC: 3.52 MIL/uL — ABNORMAL LOW (ref 3.80–5.20)
RDW: 19.5 % — ABNORMAL HIGH (ref 11.5–14.5)
WBC: 2.5 10*3/uL — ABNORMAL LOW (ref 3.6–11.0)

## 2016-02-21 NOTE — Telephone Encounter (Signed)
Called her with the use of hospital interpreter and she stated that she started her Melinda Tanner on November 20th so she is on Day 15 in the cycle. Dr. Mike Gip informed.

## 2016-02-23 ENCOUNTER — Telehealth: Payer: Self-pay | Admitting: *Deleted

## 2016-02-23 NOTE — Telephone Encounter (Signed)
April from [employer] left a vm on Dr. Aletha Halim team vm. Patient has exhausted the available funding for [employer]. Wanted to fax an application for manufacturer assistance to Dr. Kem Parkinson nurse. Please fax asap back to [employer] once form is completed.   Phone:  [phone] x [phone]

## 2016-02-25 ENCOUNTER — Ambulatory Visit: Payer: Medicare HMO

## 2016-03-04 ENCOUNTER — Ambulatory Visit: Payer: Medicare HMO

## 2016-03-06 ENCOUNTER — Inpatient Hospital Stay (HOSPITAL_BASED_OUTPATIENT_CLINIC_OR_DEPARTMENT_OTHER): Payer: Medicare HMO | Admitting: Hematology and Oncology

## 2016-03-06 ENCOUNTER — Inpatient Hospital Stay: Payer: Medicare HMO

## 2016-03-06 ENCOUNTER — Ambulatory Visit
Admission: RE | Admit: 2016-03-06 | Discharge: 2016-03-06 | Disposition: A | Discharge status: Home / Self Care | Payer: Medicare HMO | Source: Ambulatory Visit | Attending: Hematology and Oncology | Admitting: Hematology and Oncology

## 2016-03-06 ENCOUNTER — Encounter: Payer: Self-pay | Admitting: Hematology and Oncology

## 2016-03-06 VITALS — BP 111/73 | HR 98 | Temp 98.2°F | Wt 155.4 lb

## 2016-03-06 DIAGNOSIS — C50911 Malignant neoplasm of unspecified site of right female breast: Secondary | ICD-10-CM

## 2016-03-06 DIAGNOSIS — D649 Anemia, unspecified: Secondary | ICD-10-CM | POA: Diagnosis not present

## 2016-03-06 DIAGNOSIS — C7931 Secondary malignant neoplasm of brain: Secondary | ICD-10-CM

## 2016-03-06 DIAGNOSIS — Z95828 Presence of other vascular implants and grafts: Secondary | ICD-10-CM

## 2016-03-06 DIAGNOSIS — C7951 Secondary malignant neoplasm of bone: Secondary | ICD-10-CM

## 2016-03-06 DIAGNOSIS — E538 Deficiency of other specified B group vitamins: Secondary | ICD-10-CM

## 2016-03-06 DIAGNOSIS — Z170 Estrogen receptor positive status [ER+]: Secondary | ICD-10-CM | POA: Diagnosis not present

## 2016-03-06 DIAGNOSIS — Z9221 Personal history of antineoplastic chemotherapy: Secondary | ICD-10-CM | POA: Diagnosis not present

## 2016-03-06 DIAGNOSIS — Z79818 Long term (current) use of other agents affecting estrogen receptors and estrogen levels: Secondary | ICD-10-CM

## 2016-03-06 DIAGNOSIS — C50919 Malignant neoplasm of unspecified site of unspecified female breast: Secondary | ICD-10-CM

## 2016-03-06 DIAGNOSIS — Z9013 Acquired absence of bilateral breasts and nipples: Secondary | ICD-10-CM

## 2016-03-06 DIAGNOSIS — Z923 Personal history of irradiation: Secondary | ICD-10-CM

## 2016-03-06 LAB — COMPREHENSIVE METABOLIC PANEL
ALT: 11 U/L — ABNORMAL LOW (ref 14–54)
AST: 21 U/L (ref 15–41)
Albumin: 4.2 g/dL (ref 3.5–5.0)
Alkaline Phosphatase: 57 U/L (ref 38–126)
Anion gap: 7 (ref 5–15)
BUN: 12 mg/dL (ref 6–20)
CO2: 26 mmol/L (ref 22–32)
Calcium: 9.6 mg/dL (ref 8.9–10.3)
Chloride: 106 mmol/L (ref 101–111)
Creatinine, Ser: 0.58 mg/dL (ref 0.44–1.00)
GFR calc Af Amer: >60 mL/min (ref >60)
GFR calc non Af Amer: >60 mL/min (ref >60)
Glucose, Bld: 96 mg/dL (ref 65–99)
Potassium: 3.6 mmol/L (ref 3.5–5.1)
Sodium: 139 mmol/L (ref 135–145)
Total Bilirubin: 0.7 mg/dL (ref 0.3–1.2)
Total Protein: 7.6 g/dL (ref 6.5–8.1)

## 2016-03-06 LAB — CBC WITH DIFFERENTIAL/PLATELET
Basophils Absolute: 0 10*3/uL (ref 0–0.1)
Basophils Relative: 1 %
Eosinophils Absolute: 0 10*3/uL (ref 0–0.7)
Eosinophils Relative: 1 %
HCT: 31.9 % — ABNORMAL LOW (ref 35.0–47.0)
Hemoglobin: 10.8 g/dL — ABNORMAL LOW (ref 12.0–16.0)
Lymphocytes Relative: 30 %
Lymphs Abs: 0.9 10*3/uL — ABNORMAL LOW (ref 1.0–3.6)
MCH: 32.4 pg (ref 26.0–34.0)
MCHC: 33.8 g/dL (ref 32.0–36.0)
MCV: 95.7 fL (ref 80.0–100.0)
Monocytes Absolute: 0.3 10*3/uL (ref 0.2–0.9)
Monocytes Relative: 11 %
Neutro Abs: 1.7 10*3/uL (ref 1.4–6.5)
Neutrophils Relative %: 57 %
Platelets: 209 10*3/uL (ref 150–440)
RBC: 3.33 MIL/uL — ABNORMAL LOW (ref 3.80–5.20)
RDW: 21.2 % — ABNORMAL HIGH (ref 11.5–14.5)
WBC: 3 10*3/uL — ABNORMAL LOW (ref 3.6–11.0)

## 2016-03-06 LAB — VITAMIN B12: Vitamin B-12: 776 pg/mL (ref 180–914)

## 2016-03-06 MED ORDER — DENOSUMAB 120 MG/1.7ML ~~LOC~~ SOLN
120.0000 mg | Freq: Once | SUBCUTANEOUS | Status: AC
  Administered 2016-03-06: 120 mg via SUBCUTANEOUS
  Filled 2016-03-06: qty 1.7

## 2016-03-06 MED ORDER — HEPARIN SOD (PORK) LOCK FLUSH 100 UNIT/ML IV SOLN
500.0000 [IU] | Freq: Once | INTRAVENOUS | Status: AC
  Administered 2016-03-06: 500 [IU] via INTRAVENOUS

## 2016-03-06 MED ORDER — FULVESTRANT 250 MG/5ML IM SOLN
500.0000 mg | INTRAMUSCULAR | Status: DC
  Administered 2016-03-06: 500 mg via INTRAMUSCULAR
  Filled 2016-03-06: qty 10

## 2016-03-06 MED ORDER — SODIUM CHLORIDE 0.9% FLUSH
10.0000 mL | INTRAVENOUS | Status: DC | PRN
  Administered 2016-03-06: 10 mL via INTRAVENOUS
  Filled 2016-03-06: qty 10

## 2016-03-06 MED ORDER — GADOBENATE DIMEGLUMINE 529 MG/ML IV SOLN
15.0000 mL | Freq: Once | INTRAVENOUS | Status: AC | PRN
  Administered 2016-03-06: 14 mL via INTRAVENOUS

## 2016-03-06 NOTE — Progress Notes (Signed)
Patient is currently off her Ibrance.  She has completed the 1st cycle but is awaiting financial aid for next refill.  States Judeen Hammans, RN is helping her with this.  Patient is here today for MRI results.

## 2016-03-06 NOTE — Progress Notes (Signed)
[facility] day:  03/06/16   Chief Complaint: Melinda Tanner is a 45 y.o. 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   HPI:  The patient was last seen in the medical oncology clinic on 02/07/2016.  At that time, she denied any problems.  She received Xegeva and Faslodex.  She began cycle #4 Ibrance.  CBC on 02/21/2016 revealed a hematocrit of 33.5, hemoglobin 11.2, MCV 95, platelets 290,000, WBC 2500 with an ANC of 1500.  PET scan on 02/18/2016 revealed a mixed response to therapy.  There had been improvement in right cervical lymphadenopathy and in several osseous lesions.  There were several other osseous lesions in the pelvis with increasing hypermetabolism compared to the prior study. As the majority of the osseous lesions were increasingly sclerotic, some of this hypermetabolism could simply reflect bony healing. Close attention at time of future follow-up imaging was recommended.  There was no new extraskeletal metastatic disease is noted in the neck, chest, abdomen or pelvis.  Head MRI today revealed an unchanged small focus of nodular enhancement at the right frontal resection site.  There was unchanged appearance of the 2 other resection sites without abnormal enhancement.  There was no evidence of new intracranial metastases.  Symptomatically, she feels fine, but is anxious.  Sometimes she has epigastric discomfort for which she drinks ginger tea.  She does not have her next cycle of Ibrance.   Past Medical History:  Diagnosis Date  . Brain cancer ([place]) 2012   Met. from Breast  . Breast cancer ([place]) 2009  . Complication of anesthesia    nausea, "drops in potassium and magnesium"  . Seizures ([place])     Past Surgical History:  Procedure Laterality Date  . BRAIN SURGERY  2012, 2106  . CESAREAN SECTION     . LAPAROSCOPIC BILATERAL SALPINGO OOPHERECTOMY Bilateral 05/18/2015   Procedure: LAPAROSCOPIC BILATERAL SALPINGO OOPHORECTOMY;  Surgeon: Will Bonnet, MD;  Location: [facility] ORS;  Service: Gynecology;  Laterality: Bilateral;  . MASTECTOMY Bilateral 2010    Family History  Problem Relation Age of Onset  . Cancer Paternal Aunt   . Cancer Paternal Uncle   . Cancer Paternal Grandfather   . Hypertension Brother   . Diabetes Paternal Grandmother   A paternal aunt had breast cancer age 35, a paternal uncle had prostate cancer, and a paternal grandfather had leukemia.  Social History:  reports that she has never smoked. She does not have any smokeless tobacco history on file. She reports that she does not drink alcohol or use drugs.  She is from [place].  She moved to [place] in 2015.  She moved to [place] in 04/2014.  She recently moved into the [place] area.  She has 2 children (boy and girl).  Her family will likely be moving to [place] in December.  They are looking for a home.  The patient is accompanied by the Spanish interpreter today.  Allergies:  Allergies  Allergen Reactions  . Taxol [Paclitaxel] Anaphylaxis  . Aspirin Swelling  . Penicillins Swelling  . Zofran [Ondansetron Hcl] Nausea And Vomiting    Current Medications: Current Outpatient Prescriptions  Medication Sig Dispense Refill  . Cyanocobalamin (VITAMIN B-12 PO) Take by mouth.    . dexamethasone (DECADRON) 2 MG tablet Take 1 tablet (2 mg total) by mouth daily.  Take 1 tablet daily x 1 week and then take 1 tablet every other day for 1 week , then stop taking 11 tablet 0  . omeprazole (PRILOSEC) 20 MG capsule Take 20 mg by mouth daily.  2  . oxycodone (OXY-IR) 5 MG capsule Take 1 capsule (5 mg total) by mouth every 4 (four) hours as needed. 30 capsule 0  . pantoprazole (PROTONIX) 20 MG tablet TK 1 T PO D  3  . potassium chloride SA (K-DUR,KLOR-CON) 20 MEQ tablet Take 1 tablet (20 mEq total) by mouth 2  (two) times daily. for 1 days then 1 pill a day x 2 days. 10 tablet 0  . palbociclib (IBRANCE) 100 MG capsule Take 1 capsule daily for 21 days and then take 1 week off and repeat cycle again (Take whole with food.) (Patient not taking: Reported on 03/06/2016) 21 capsule 1   No current facility-administered medications for this visit.    Facility-Administered Medications Ordered in Other Visits  Medication Dose Route Frequency Provider Last Rate Last Dose  . heparin lock flush 100 unit/mL  500 Units Intravenous Once Lloyd Huger, MD      . sodium chloride flush (NS) 0.9 % injection 10 mL  10 mL Intravenous PRN Lloyd Huger, MD        Review of Systems:  GENERAL:  Feels good.  No fevers or sweats.  Weight up 1 pound. PERFORMANCE STATUS (ECOG):  1 HEENT:  No visual changes, runny nose, sore throat, mouth sores or tenderness. Lungs: No shortness of breath or cough.  No hemoptysis. Cardiac:  No chest pain, palpitations, orthopnea, or PND. GI:  Appetite good.  Sometimes feels bloated. Taking Protonix.  No nausea, vomiting, diarrhea, constipation, melena or hematochezia. GU:  No urgency, frequency, dysuria or hematuria. Musculoskeletal: No none or joint pain.  No muscle tenderness. Extremities:  No pain or swelling. Skin:  No rashes or irritation. Neuro:  No headache, numbness or weakness, balance or coordination issues.  Endocrine:  No diabetes, thyroid issues, hot flashes or night sweats. Psych:  No mood changes, depression or anxiety. Pain:  No focal pain. Review of systems:  All other systems reviewed and found to be negative.  Physical Exam: Blood pressure 111/73, pulse 98, temperature 98.2 F (36.8 C), weight 155 lb 6.8 oz (70.5 kg), last menstrual period 03/19/2015, peak flow (!) 18 L/min. GENERAL:  Well developed, well nourished, woman sitting comfortably in the exam room in no acute distress.  MENTAL STATUS:  Alert and oriented to person, place and time. HEAD: Wearing a  pink cap.  Normocephalic, atraumatic, face symmetric, no Cushingoid features. EYES:  Brown eyes. Pupils equal round and reactive to light and accomodation.  No conjunctivitis or scleral icterus. ENT:  Oropharynx clear without lesion.  Tongue normal. Mucous membranes moist.  RESPIRATORY:  Clear to auscultation without rales, wheezes or rhonchi. CARDIOVASCULAR:  Regular rate and rhythm without murmur, rub or gallop. ABDOMEN:  Soft, non-tender, with active bowel sounds, and no hepatosplenomegaly.  No masses. SKIN:  No rashes, bruises or ulcers. EXTREMITIES:  No edema, no skin discoloration or tenderness.  No palpable cords. LYMPH NODES:  No palpable cervical, supraclavicular, axillary or inguinal adenopathy  NEUROLOGICAL:  Appropriate. PSYCH:  Appropriate.     Pathology: 09/03/2007: Right breast core biopsy: infiltrating duct cell carcinoma high grade ER/PR (reportedly positive) Her2 (?) 10/02/2007: Right axillary node core biopsy: Fragment with metastatic carcinoma 10/23/2007: Left breast core biopsy: DCIS high grade ER/PR (?) 07/13/2008: Right mastectomy:  Invasive ductal carcinoma Grade III Nottingham score = tubules 3+ Nuclei 3+ mitoses 2+) 1.7 cm size. No lymph invasion. Tumor cellularity 20%. [id number]. 07/13/2008: Left mastectomy: Residual ductal carcinoma in situ, high nuclear grade, deep margin negative ypTisNO(sn)MX. 10/25/2010: Abdominal and pelvic ultrasound: questionable lesion near gallbladder fossa recommend MRI follow up. 03/02/2011: Left and right frontal excisional brain biopsy: Adenocarcinoma, metastatic, NOS: ER +, PR 5% +, Her 2 NEG. 03/04/2011: [facility] testing of IHC4 residual recurrence risk score of 114 showing an 8 year recurrence reate of 57%. ER POSTIVE, PR POSITIVE, HER 2 POSTIVE (previously documented negative in 2009) cutoff of 361 patient scores 426. ki67 71%.  08/05/2013: Right breast FNA supraclavicular region: positive for metastatic carcinoma. ER/PR/HER2 not  reported. 08/19/2014: Right cervical lymph node. Adenocarcinoma with features consistent with metastatic breast carcinoma. ER/PR/HER2 insufficient tissue. 09/04/2014: Repeat craniotomy, resection of right frontal tumor (metastatic breast cancer). ER 90-100% PR 51-60% HER2 - 09/29/2014: Right cervical lymph node, metastatic carcinoma c/w breast primary, ER 100% PR 20% HER2-  Imaging: 08/15/2007: Bilateral Mammogram: Dense breasts with solid lesion at 6:00, 7:00, and 9:00 of right breast, solid lesion at 2:00 position left breast BIRADS 4B. 09/10/2007: Breast MRI: Three solid irregular enhancing lesions of the right breast 2.1 x 3.2, 2.8 x 2.7, and 1.8 x 1.6 cm. Mildly enlarged enhancing nodule right axilla. Left breast clumped enhancement midportion suspicious for malignancy. 03/01/2011: Brain MRI: At least two juxtacortical, intra-axial masses with extensive surrounding vasogenic edema most consistent with intracranial metastasis. 07/22/2013: Brain MRI +/- contrast: interval increase in enhancing component on T2 FLAIR now measuring 1.0cm x 1.1 cm worrisome for progression of disease. 01/21/2014:  PET scan: Hypermetabolic small right supraclavicular and right upper paratracheal lymph nodes suspicious for mets. Hypermetabolic lymph node in the right paratracheal upper mediastinum that measure approximately 0.9 x 0.5cm. (of note no impressions made of areas noted on brain MRI 07/22/13).  01/21/2014: Head MRI: Right frontal enhancing lesion has shown interval increase in size with surrounding edema and local mass effect (measured 1.8x2.1x1.6, previously 1.2x1.1x1.0) 07/16/2014: Head MRI:  Right anterior frontal enhancing mass 2.5 x 2.0 cm compatible with a metastatic lesion has increased and changed in configuration from previous 2.0 x 1.8 cm, formerly multilobular. 08/06/2014: PET scan:  Multiple bilateral FDG avid low cervical, supraclavicular, upper mediastinal, and right internal mammary lymphadenopathy,  likely representing metastatic disease. A cervical node in the right lower neck should be amenable to percutaneous sampling.  12/2014: Head MRI:  Evolution of right frontal postoperative changes status post tumor resection at the vertex. Expected postoperative changes. Minimal marginal enhancement resection bed with some adjacent posterior intrinsic T1 signal again seen. Decreasing T2/FLAIR adjacent parenchymal changes.  Stable resection cavity is left posterior frontal and right parieto-occipital regions. 04/20/2015 : PET scan: Cervical and thoracic nodal metastasis.  There was multifocal osseous metastasis (left transverse T4 process, left humeral head, right iliac wing, and left side of the sacrum).  Incidental findings included right nephrolithiasis.   04/22/2015: Bone scan: Corresponded with PET-CT with metastatic lesions evident in the left humeral head, left posterior aspect of T4, left aspect of S1, and the right iliac crest.  There was no other definite foci of metastatic disease to bone.  Uptake in the skull was most suggestive of the previous right frontal craniotomy. 05/24/2015: Bone density:  T-score of -0.8 in the right femoral neck (normal). 07/07/2015:  Head MRI:  Metastatic breast cancer with 3 resection cavities. The anterior right frontal cavity was positive for nodular marginal  enhancement, new from brain MRI report 01/07/2015, and consistent with recurrent disease.  08/12/2015:  Lumbar Spine MRI: Osseous metastatic disease at L3, L4, L5, S1, and in the left iliac bone posteriorly. There was no definite epidural metastatic disease. 08/20/2015:  PET scan:  Response to therapy in the lower cervical and thoracic adenopathy.  There was a mixed response to multifocal osseous metastases (some new lesions).  12/06/2015:  Head MRI:  Decreased nodular contrast enhancement at the right frontal lobe treatment site.  There was unchanged appearance of treatment sites within the bilateral parietal lobe  size without residual or recurrent disease.  There were no new metastatic lesions. 02/18/2016:  PET scan:  Mixed response to therapy.  There had been improvement in right cervical lymphadenopathy and in several osseous lesions.  There were several other osseous lesions in the pelvis with increasing hypermetabolism compared to the prior study. As the majority of the osseous lesions were increasingly sclerotic, some of this hypermetabolism could simply reflect bony healing.  There was no new extraskeletal metastatic disease is noted in the neck, chest, abdomen or pelvis. 12/018/2017: Head MRI: Unchanged small focus of nodular enhancement at the right frontal resection site.  There was unchanged appearance of the 2 other resection sites without abnormal enhancement.  There was no evidence of new intracranial metastases.   Labs:  Appointment on 03/06/2016  Component Date Value Ref Range Status  . WBC 03/06/2016 3.0* 3.6 - 11.0 K/uL Final  . RBC 03/06/2016 3.33* 3.80 - 5.20 MIL/uL Final  . Hemoglobin 03/06/2016 10.8* 12.0 - 16.0 g/dL Final  . HCT 03/06/2016 31.9* 35.0 - 47.0 % Final  . MCV 03/06/2016 95.7  80.0 - 100.0 fL Final  . MCH 03/06/2016 32.4  26.0 - 34.0 pg Final  . MCHC 03/06/2016 33.8  32.0 - 36.0 g/dL Final  . RDW 03/06/2016 21.2* 11.5 - 14.5 % Final  . Platelets 03/06/2016 209  150 - 440 K/uL Final  . Neutrophils Relative % 03/06/2016 57  % Final  . Neutro Abs 03/06/2016 1.7  1.4 - 6.5 K/uL Final  . Lymphocytes Relative 03/06/2016 30  % Final  . Lymphs Abs 03/06/2016 0.9* 1.0 - 3.6 K/uL Final  . Monocytes Relative 03/06/2016 11  % Final  . Monocytes Absolute 03/06/2016 0.3  0.2 - 0.9 K/uL Final  . Eosinophils Relative 03/06/2016 1  % Final  . Eosinophils Absolute 03/06/2016 0.0  0 - 0.7 K/uL Final  . Basophils Relative 03/06/2016 1  % Final  . Basophils Absolute 03/06/2016 0.0  0 - 0.1 K/uL Final  . Sodium 03/06/2016 139  135 - 145 mmol/L Final  . Potassium 03/06/2016 3.6  3.5 -  5.1 mmol/L Final  . Chloride 03/06/2016 106  101 - 111 mmol/L Final  . CO2 03/06/2016 26  22 - 32 mmol/L Final  . Glucose, Bld 03/06/2016 96  65 - 99 mg/dL Final  . BUN 03/06/2016 12  6 - 20 mg/dL Final  . Creatinine, Ser 03/06/2016 0.58  0.44 - 1.00 mg/dL Final  . Calcium 03/06/2016 9.6  8.9 - 10.3 mg/dL Final  . Total Protein 03/06/2016 7.6  6.5 - 8.1 g/dL Final  . Albumin 03/06/2016 4.2  3.5 - 5.0 g/dL Final  . AST 03/06/2016 21  15 - 41 U/L Final  . ALT 03/06/2016 11* 14 - 54 U/L Final  . Alkaline Phosphatase 03/06/2016 57  38 - 126 U/L Final  . Total Bilirubin 03/06/2016 0.7  0.3 - 1.2 mg/dL Final  .  GFR calc non Af Amer 03/06/2016 >60  >60 mL/min Final  . GFR calc Af Amer 03/06/2016 >60  >60 mL/min Final   Comment: (NOTE) The eGFR has been calculated using the CKD EPI equation. This calculation has not been validated in all clinical situations. eGFR's persistently <60 mL/min signify possible Chronic Kidney Disease.   . Anion gap 03/06/2016 7  5 - 15 Final    Assessment:  Melinda Tanner is a 45 y.o. 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  She initially presented in 2009 while living in [place] with multi-focal right breast cancer with positive lymph node(s) which was ER+, PR+(low), and HER2/neu+. She received neoadjuvant chemotherapy (AC x 4 every 3 weeks followed by Taxol/Abraxane + carboplatin weekly x 12) without anti-HER2 treatment.    On 07/14/2008, she underwent bilateral mastectomies followed by reconstruction.  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   Zero of 11 lymph nodes on the right were positive for malignancy. 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 Deep margin was negative. Zero of 2 lymph nodes were positive for metastatic disease.  She received adjuvant radiation to the right breast.  She received adjuvant tamoxifen and Zoladex.  She could not tolerate symptoms of joint pain and  tamoxifen was discontinued after 1-2 months.  Zoladex was continued for approximately 1.5 years.  She was diagnosed with brain metastases in 02/2011.  Pathology revealed ER+, PR+(low), and HER2/neu-.  She underwent resection followed by [facility].  On 03/09/2011, original breast tissue (09/03/2007) sent to [facility] testing.  Testing revealed ER+, PR+(borderline), and HER2/neu positive (different than initial testing).  She received Herceptin for 1 year beginning in 2013.  She then began Tykerb and Xeloda after completion of Herceptin (2014).  She was on extremely low, and probably sub-therapeutic, dosing of lapatinib + capecitabine.   She developed right supraclavicular adenopathy in 2015. She was noted to have slow disease progression in the right frontal/parietal lesion with minimal presence of systemic disease on PET scans. Capecitabine + lapatinib were discontinued in 04/2014.   She underwent repeat craniotomy with resection of brain metastasis on 09/04/2014.  Biopsy of the right supraclavicular lymph node  was ER+,PR+,HER2/neu-.  She started tamoxifen in 09/2014, but discontinued it secondary to pain in hip, back, and shoulder.  Head MRI on 03/06/2016 revealed an unchanged small focus of nodular enhancement at the right frontal resection site.  There was unchanged appearance of the 2 other resection sites without abnormal enhancement.  There was no evidence of new intracranial metastases.    PET scan on 02/18/2016 revealed a mixed response to therapy.  There had been improvement in right cervical lymphadenopathy and in several osseous lesions.  There were several other osseous lesions in the pelvis with increasing hypermetabolism compared to the prior study. As the majority of the osseous lesions were increasingly sclerotic, some of this hypermetabolism could simply reflect bony healing.  There was no new extraskeletal metastatic disease is noted in the neck, chest, abdomen or  pelvis.  CA27.29 was 177.8 on 04/16/2015, 276.4 on 05/28/2015, 287.5 on 07/13/2015, 333.8 on 07/26/2015, 412.9 on 08/23/2015, 315.6 on 09/20/2015, 188.8 on 10/18/2015, 151.5 on 11/15/2015, 122.7 on 12/10/2015, 94.6 on 01/10/2016, 80.9 on 02/07/2016, and 84.8 on 03/06/2016.  Bone density study on 05/24/2015 revealed a T-score of -0.8 in the right femoral neck (normal).  She restarted tamoxifen on 04/23/2015.  Tamoxifen was discontinued on 08/23/2015 secondary to progressive  disease.  She received Zoladex on 04/30/2015.  She underwent laparoscopic bilateral oophorectomy on 05/18/2015.  She receives monthly Xgeva (began 04/30/2015; last 01/10/2016).  She is post-menopausal.  She began Faslodex on 08/23/2015 (last 01/10/2016).  She is currently day 1 of cycle #5  Ibrance (09/20/2015 - 03/06/2016).  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  Work-up on 10/25/2015 revealed a B12 of 141 (low).  B12 was 205 on 12/13/2015 with an MMA of 171 (normal) on 01/10/2016.  Ferritin was 43. Iron studies included a saturation of 13% and a TIBC of 354. TSH and folate were normal.  Reticulocyte count was 2.2%.  She declined B12 injections.  She started oral B12 1000 mcg on 11/05/2015.  She takes her B12 sporadically.  B12 level was 205 (low normal) on 12/13/2015.  Diet is good.  She denies any melena or hematochezia.  Symptomatically, she denies any pain.  She has some mild epigastric discomfort on Protonix.  She drinks ginger tea.  She has mild leukopenia (WBC 3000; ANC 1570) on Ibrance. She receives Faslodex and Xegva monthly.  Calcium is 9.6.  Plan: 1.  Labs today:  CBC with diff, CMP, B12, CA27.29. 2.  Review PET scan.  Review questionable mixed response to therapy with some increase activity in a few osseous metastsis (? healing bone).  No new lesions.  Other  lesions improving.  Discuss plan for continuation of therapy and close monitoring. 3.  Review head MRI.  Scan is stable.  Continue to monitor every 3 months. 4.  Xgeva today. 5.  Faslodex today. 6.  Continue oral B12. 7.  Moishe Spice, RN and Mallie Snooks, RN to assist with obtaining [facility] (pharmacy). 8.  RTC in 1 month for MD assessment, labs (CBC with diff, CMP, CA27.29), Xgeva + Faslodex.   Lequita Asal, MD  03/06/2016, 2:17 PM

## 2016-03-07 LAB — CANCER ANTIGEN 27.29: CA 27.29: 79 U/mL — ABNORMAL HIGH (ref 0.0–38.6)

## 2016-03-23 ENCOUNTER — Telehealth: Payer: Self-pay | Admitting: *Deleted

## 2016-03-23 NOTE — Telephone Encounter (Signed)
I had faxed application on 40/97/35 and I am no longer on theteam but helping them today and called to check status of approval or not. The person I spoke to on the phone could not find the application and I was put on hold and they finally found it. Asked me if the pt had tried copay foundations and I told her yes. She had pt adv. Monette has ran out and the specialty pharmacy had suggested that we try the next application through [employer]. She asked additional questions and then told me she wa expediting it because they have had the application for so long and hopefully get approval soon.

## 2016-03-28 ENCOUNTER — Telehealth: Payer: Self-pay | Admitting: *Deleted

## 2016-03-28 NOTE — Telephone Encounter (Signed)
rtn call to [employer] and was trf to Ramey and he states that pt has been approved for ibrance-no payment for the patient.  It has been approved for the entire year 2018.  Her shipment is suppose to be today and she had 3 refills and when pt gets down to 3 pills she can call and ask for another shipment. Will pass this along to tracie the nurse for [facility]

## 2016-04-03 ENCOUNTER — Inpatient Hospital Stay: Payer: Medicare HMO

## 2016-04-03 ENCOUNTER — Inpatient Hospital Stay: Payer: Medicare HMO | Admitting: Hematology and Oncology

## 2016-04-04 ENCOUNTER — Inpatient Hospital Stay: Payer: Medicare HMO | Attending: Hematology and Oncology

## 2016-04-04 ENCOUNTER — Inpatient Hospital Stay (HOSPITAL_BASED_OUTPATIENT_CLINIC_OR_DEPARTMENT_OTHER): Payer: Medicare HMO | Admitting: Hematology and Oncology

## 2016-04-04 ENCOUNTER — Inpatient Hospital Stay: Payer: Medicare HMO

## 2016-04-04 ENCOUNTER — Other Ambulatory Visit: Payer: Self-pay | Admitting: Hematology and Oncology

## 2016-04-04 VITALS — BP 120/81 | HR 74 | Resp 18 | Wt 159.4 lb

## 2016-04-04 DIAGNOSIS — C50911 Malignant neoplasm of unspecified site of right female breast: Secondary | ICD-10-CM | POA: Diagnosis not present

## 2016-04-04 DIAGNOSIS — Z79899 Other long term (current) drug therapy: Secondary | ICD-10-CM | POA: Diagnosis not present

## 2016-04-04 DIAGNOSIS — C7951 Secondary malignant neoplasm of bone: Secondary | ICD-10-CM | POA: Insufficient documentation

## 2016-04-04 DIAGNOSIS — Z923 Personal history of irradiation: Secondary | ICD-10-CM | POA: Diagnosis not present

## 2016-04-04 DIAGNOSIS — Z9013 Acquired absence of bilateral breasts and nipples: Secondary | ICD-10-CM | POA: Insufficient documentation

## 2016-04-04 DIAGNOSIS — C50919 Malignant neoplasm of unspecified site of unspecified female breast: Secondary | ICD-10-CM

## 2016-04-04 DIAGNOSIS — C7931 Secondary malignant neoplasm of brain: Secondary | ICD-10-CM | POA: Diagnosis not present

## 2016-04-04 DIAGNOSIS — Z170 Estrogen receptor positive status [ER+]: Secondary | ICD-10-CM | POA: Diagnosis not present

## 2016-04-04 DIAGNOSIS — Z9221 Personal history of antineoplastic chemotherapy: Secondary | ICD-10-CM | POA: Insufficient documentation

## 2016-04-04 DIAGNOSIS — E538 Deficiency of other specified B group vitamins: Secondary | ICD-10-CM

## 2016-04-04 LAB — CBC WITH DIFFERENTIAL/PLATELET
Basophils Absolute: 0.1 10*3/uL (ref 0–0.1)
Basophils Relative: 1 %
Eosinophils Absolute: 0.3 10*3/uL (ref 0–0.7)
Eosinophils Relative: 5 %
HCT: 33.7 % — ABNORMAL LOW (ref 35.0–47.0)
Hemoglobin: 11.2 g/dL — ABNORMAL LOW (ref 12.0–16.0)
Lymphocytes Relative: 17 %
Lymphs Abs: 1 10*3/uL (ref 1.0–3.6)
MCH: 31.8 pg (ref 26.0–34.0)
MCHC: 33.2 g/dL (ref 32.0–36.0)
MCV: 95.6 fL (ref 80.0–100.0)
Monocytes Absolute: 0.6 10*3/uL (ref 0.2–0.9)
Monocytes Relative: 10 %
Neutro Abs: 4.1 10*3/uL (ref 1.4–6.5)
Neutrophils Relative %: 67 %
Platelets: 324 10*3/uL (ref 150–440)
RBC: 3.52 MIL/uL — ABNORMAL LOW (ref 3.80–5.20)
RDW: 17.8 % — ABNORMAL HIGH (ref 11.5–14.5)
WBC: 6.1 10*3/uL (ref 3.6–11.0)

## 2016-04-04 LAB — COMPREHENSIVE METABOLIC PANEL
ALT: 15 U/L (ref 14–54)
AST: 21 U/L (ref 15–41)
Albumin: 4.1 g/dL (ref 3.5–5.0)
Alkaline Phosphatase: 75 U/L (ref 38–126)
Anion gap: 7 (ref 5–15)
BUN: 17 mg/dL (ref 6–20)
CO2: 27 mmol/L (ref 22–32)
Calcium: 9.6 mg/dL (ref 8.9–10.3)
Chloride: 104 mmol/L (ref 101–111)
Creatinine, Ser: 0.71 mg/dL (ref 0.44–1.00)
GFR calc Af Amer: >60 mL/min (ref >60)
GFR calc non Af Amer: >60 mL/min (ref >60)
Glucose, Bld: 87 mg/dL (ref 65–99)
Potassium: 4.2 mmol/L (ref 3.5–5.1)
Sodium: 138 mmol/L (ref 135–145)
Total Bilirubin: 0.8 mg/dL (ref 0.3–1.2)
Total Protein: 7.7 g/dL (ref 6.5–8.1)

## 2016-04-04 MED ORDER — FULVESTRANT 250 MG/5ML IM SOLN
500.0000 mg | INTRAMUSCULAR | Status: DC
  Administered 2016-04-04: 500 mg via INTRAMUSCULAR

## 2016-04-04 MED ORDER — DENOSUMAB 120 MG/1.7ML ~~LOC~~ SOLN
120.0000 mg | Freq: Once | SUBCUTANEOUS | Status: AC
  Administered 2016-04-04: 120 mg via SUBCUTANEOUS
  Filled 2016-04-04: qty 1.7

## 2016-04-04 NOTE — Progress Notes (Signed)
Patient states since having radiation she has some pain in upper mid abdomen.  She also states she has been drinking a tea that has helped her.  States it has lemon, ginger and a probiotic in it.  She wants to know if she is okay to drink it?  Patient states she received her Ibrance on Friday.  She started it on Sunday.

## 2016-04-04 NOTE — Progress Notes (Signed)
[facility] day:  04/04/16   Chief Complaint: Melinda Tanner is a 46 y.o. 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   HPI:  The patient was last seen in the medical oncology clinic on 03/06/2016.  At that time, she was doing well.  PET scan revealed a mixed response to therapy.  As the majority of the osseous lesions were increasingly sclerotic, some of this hypermetabolism could simply reflect bony healing. Close attention at time of future follow-up imaging was recommended.  Head MRI revealed stable disease.  CA27.29 was 79.  She received Xegeva and Faslodex.  She states that she was off [place] almost a month secondary to coverage issues.  She began cycle #4 Ibrance on 04/02/2016 (completes on 04/22/2016).  Symptomatically, she is feeling good.  She denies any pain.  She denies any nausea or vomiting.   Past Medical History:  Diagnosis Date  . Brain cancer ([place]) 2012   Met. from Breast  . Breast cancer ([place]) 2009  . Complication of anesthesia    nausea, "drops in potassium and magnesium"  . Seizures ([place])     Past Surgical History:  Procedure Laterality Date  . BRAIN SURGERY  2012, 2106  . CESAREAN SECTION    . LAPAROSCOPIC BILATERAL SALPINGO OOPHERECTOMY Bilateral 05/18/2015   Procedure: LAPAROSCOPIC BILATERAL SALPINGO OOPHORECTOMY;  Surgeon: Will Bonnet, MD;  Location: [facility] ORS;  Service: Gynecology;  Laterality: Bilateral;  . MASTECTOMY Bilateral 2010    Family History  Problem Relation Age of Onset  . Cancer Paternal Aunt   . Cancer Paternal Uncle   . Cancer Paternal Grandfather   . Hypertension Brother   . Diabetes Paternal Grandmother   A paternal aunt had breast cancer age 19, a paternal uncle had prostate cancer, and a paternal grandfather had leukemia.  Social History:  reports  that she has never smoked. She does not have any smokeless tobacco history on file. She reports that she does not drink alcohol or use drugs.  She is from [place].  She moved to [place] in 2015.  She moved to [place] in 04/2014.  She recently moved into the [place] area.  She has 2 children (boy and girl).  Her family will likely be moving to [facility] in December.  They are looking for a Tanner.  The patient is accompanied by the Spanish interpreter today.  Allergies:  Allergies  Allergen Reactions  . Taxol [Paclitaxel] Anaphylaxis  . Aspirin Swelling  . Penicillins Swelling  . Zofran [Ondansetron Hcl] Nausea And Vomiting    Current Medications: Current Outpatient Prescriptions  Medication Sig Dispense Refill  . Cyanocobalamin (VITAMIN B-12 PO) Take by mouth.    Marland Kitchen omeprazole (PRILOSEC) 20 MG capsule Take 20 mg by mouth daily.  2  . palbociclib (IBRANCE) 100 MG capsule Take 1 capsule daily for 21 days and then take 1 week off and repeat cycle again (Take whole with food.) 21 capsule 1  . pantoprazole (PROTONIX) 20 MG tablet TK 1 T PO D  3  . oxycodone (OXY-IR) 5 MG capsule Take 1 capsule (5 mg total) by mouth every 4 (four) hours as needed. (Patient not taking: Reported on 04/04/2016) 30 capsule 0  . potassium chloride SA (K-DUR,KLOR-CON) 20 MEQ tablet Take 1 tablet (20 mEq total) by mouth 2 (two) times daily.  for 1 days then 1 pill a day x 2 days. (Patient not taking: Reported on 04/04/2016) 10 tablet 0   No current facility-administered medications for this visit.     Review of Systems:  GENERAL:  Feels good.  No fevers or sweats.  Weight up 4 pound. PERFORMANCE STATUS (ECOG):  1 HEENT:  No visual changes, runny nose, sore throat, mouth sores or tenderness. Lungs: No shortness of breath or cough.  No hemoptysis. Cardiac:  No chest pain, palpitations, orthopnea, or PND. GI:  Appetite good.  Upper abdominal discomfort for which she takes ginger tea with lemon.  No nausea,  vomiting, diarrhea, constipation, melena or hematochezia. GU:  No urgency, frequency, dysuria or hematuria. Musculoskeletal: No none or joint pain.  No muscle tenderness. Extremities:  No pain or swelling. Skin:  No rashes or irritation. Neuro:  No headache, numbness or weakness, balance or coordination issues.  Endocrine:  No diabetes, thyroid issues, hot flashes or night sweats. Psych:  No mood changes, depression or anxiety. Pain:  No focal pain. Review of systems:  All other systems reviewed and found to be negative.  Physical Exam: Blood pressure 120/81, pulse 74, resp. rate 18, weight 159 lb 6.3 oz (72.3 kg), last menstrual period 03/19/2015. GENERAL:  Well developed, well nourished, woman sitting comfortably in the exam room in no acute distress.  MENTAL STATUS:  Alert and oriented to person, place and time. HEAD: Wearing a brown crochet hat.  Black hair.  Normocephalic, atraumatic, face symmetric, no Cushingoid features. EYES:  Brown eyes. Pupils equal round and reactive to light and accomodation.  No conjunctivitis or scleral icterus. ENT:  Oropharynx clear without lesion.  Tongue normal. Mucous membranes moist.  RESPIRATORY:  Clear to auscultation without rales, wheezes or rhonchi. CARDIOVASCULAR:  Regular rate and rhythm without murmur, rub or gallop. ABDOMEN:  Soft, non-tender, with active bowel sounds, and no hepatosplenomegaly.  No masses. SKIN:  No rashes, bruises or ulcers. EXTREMITIES:  No edema, no skin discoloration or tenderness.  No palpable cords. LYMPH NODES:  No palpable cervical, supraclavicular, axillary or inguinal adenopathy  NEUROLOGICAL:  Appropriate. PSYCH:  Appropriate.     Pathology: 09/03/2007: Right breast core biopsy: infiltrating duct cell carcinoma high grade ER/PR (reportedly positive) Her2 (?) 10/02/2007: Right axillary node core biopsy: Fragment with metastatic carcinoma 10/23/2007: Left breast core biopsy: DCIS high grade ER/PR (?) 07/13/2008:  Right mastectomy: Invasive ductal carcinoma Grade III Nottingham score = tubules 3+ Nuclei 3+ mitoses 2+) 1.7 cm size. No lymph invasion. Tumor cellularity 20%. [id number]. 07/13/2008: Left mastectomy: Residual ductal carcinoma in situ, high nuclear grade, deep margin negative ypTisNO(sn)MX. 10/25/2010: Abdominal and pelvic ultrasound: questionable lesion near gallbladder fossa recommend MRI follow up. 03/02/2011: Left and right frontal excisional brain biopsy: Adenocarcinoma, metastatic, NOS: ER +, PR 5% +, Her 2 NEG. 03/04/2011: [facility] testing of IHC4 residual recurrence risk score of 114 showing an 8 year recurrence reate of 57%. ER POSTIVE, PR POSITIVE, HER 2 POSTIVE (previously documented negative in 2009) cutoff of 361 patient scores 426. ki67 71%.  08/05/2013: Right breast FNA supraclavicular region: positive for metastatic carcinoma. ER/PR/HER2 not reported. 08/19/2014: Right cervical lymph node. Adenocarcinoma with features consistent with metastatic breast carcinoma. ER/PR/HER2 insufficient tissue. 09/04/2014: Repeat craniotomy, resection of right frontal tumor (metastatic breast cancer). ER 90-100% PR 51-60% HER2 - 09/29/2014: Right cervical lymph node, metastatic carcinoma c/w breast primary, ER 100% PR 20% HER2-  Imaging: 08/15/2007: Bilateral Mammogram: Dense breasts with solid lesion at 6:00, 7:00, and 9:00 of  right breast, solid lesion at 2:00 position left breast BIRADS 4B. 09/10/2007: Breast MRI: Three solid irregular enhancing lesions of the right breast 2.1 x 3.2, 2.8 x 2.7, and 1.8 x 1.6 cm. Mildly enlarged enhancing nodule right axilla. Left breast clumped enhancement midportion suspicious for malignancy. 03/01/2011: Brain MRI: At least two juxtacortical, intra-axial masses with extensive surrounding vasogenic edema most consistent with intracranial metastasis. 07/22/2013: Brain MRI +/- contrast: interval increase in enhancing component on T2 FLAIR now measuring 1.0cm x 1.1 cm  worrisome for progression of disease. 01/21/2014:  PET scan: Hypermetabolic small right supraclavicular and right upper paratracheal lymph nodes suspicious for mets. Hypermetabolic lymph node in the right paratracheal upper mediastinum that measure approximately 0.9 x 0.5cm. (of note no impressions made of areas noted on brain MRI 07/22/13).  01/21/2014: Head MRI: Right frontal enhancing lesion has shown interval increase in size with surrounding edema and local mass effect (measured 1.8x2.1x1.6, previously 1.2x1.1x1.0) 07/16/2014: Head MRI:  Right anterior frontal enhancing mass 2.5 x 2.0 cm compatible with a metastatic lesion has increased and changed in configuration from previous 2.0 x 1.8 cm, formerly multilobular. 08/06/2014: PET scan:  Multiple bilateral FDG avid low cervical, supraclavicular, upper mediastinal, and right internal mammary lymphadenopathy, likely representing metastatic disease. A cervical node in the right lower neck should be amenable to percutaneous sampling.  12/2014: Head MRI:  Evolution of right frontal postoperative changes status post tumor resection at the vertex. Expected postoperative changes. Minimal marginal enhancement resection bed with some adjacent posterior intrinsic T1 signal again seen. Decreasing T2/FLAIR adjacent parenchymal changes.  Stable resection cavity is left posterior frontal and right parieto-occipital regions. 04/20/2015 : PET scan: Cervical and thoracic nodal metastasis.  There was multifocal osseous metastasis (left transverse T4 process, left humeral head, right iliac wing, and left side of the sacrum).  Incidental findings included right nephrolithiasis.   04/22/2015: Bone scan: Corresponded with PET-CT with metastatic lesions evident in the left humeral head, left posterior aspect of T4, left aspect of S1, and the right iliac crest.  There was no other definite foci of metastatic disease to bone.  Uptake in the skull was most suggestive of the previous  right frontal craniotomy. 05/24/2015: Bone density:  T-score of -0.8 in the right femoral neck (normal). 07/07/2015:  Head MRI:  Metastatic breast cancer with 3 resection cavities. The anterior right frontal cavity was positive for nodular marginal enhancement, new from brain MRI report 01/07/2015, and consistent with recurrent disease.  08/12/2015:  Lumbar Spine MRI: Osseous metastatic disease at L3, L4, L5, S1, and in the left iliac bone posteriorly. There was no definite epidural metastatic disease. 08/20/2015:  PET scan:  Response to therapy in the lower cervical and thoracic adenopathy.  There was a mixed response to multifocal osseous metastases (some new lesions).  12/06/2015:  Head MRI:  Decreased nodular contrast enhancement at the right frontal lobe treatment site.  There was unchanged appearance of treatment sites within the bilateral parietal lobe size without residual or recurrent disease.  There were no new metastatic lesions. 02/18/2016:  PET scan:  Mixed response to therapy.  There had been improvement in right cervical lymphadenopathy and in several osseous lesions.  There were several other osseous lesions in the pelvis with increasing hypermetabolism compared to the prior study. As the majority of the osseous lesions were increasingly sclerotic, some of this hypermetabolism could simply reflect bony healing.  There was no new extraskeletal metastatic disease is noted in the neck, chest, abdomen or pelvis. 03/06/2016:  Head MRI: Unchanged small focus of nodular enhancement at the right frontal resection site.  There was unchanged appearance of the 2 other resection sites without abnormal enhancement.  There was no evidence of new intracranial metastases.  Labs:  Appointment on 04/04/2016  Component Date Value Ref Range Status  . WBC 04/04/2016 6.1  3.6 - 11.0 K/uL Final  . RBC 04/04/2016 3.52* 3.80 - 5.20 MIL/uL Final  . Hemoglobin 04/04/2016 11.2* 12.0 - 16.0 g/dL Final  . HCT  04/04/2016 33.7* 35.0 - 47.0 % Final  . MCV 04/04/2016 95.6  80.0 - 100.0 fL Final  . MCH 04/04/2016 31.8  26.0 - 34.0 pg Final  . MCHC 04/04/2016 33.2  32.0 - 36.0 g/dL Final  . RDW 04/04/2016 17.8* 11.5 - 14.5 % Final  . Platelets 04/04/2016 324  150 - 440 K/uL Final  . Neutrophils Relative % 04/04/2016 67  % Final  . Neutro Abs 04/04/2016 4.1  1.4 - 6.5 K/uL Final  . Lymphocytes Relative 04/04/2016 17  % Final  . Lymphs Abs 04/04/2016 1.0  1.0 - 3.6 K/uL Final  . Monocytes Relative 04/04/2016 10  % Final  . Monocytes Absolute 04/04/2016 0.6  0.2 - 0.9 K/uL Final  . Eosinophils Relative 04/04/2016 5  % Final  . Eosinophils Absolute 04/04/2016 0.3  0 - 0.7 K/uL Final  . Basophils Relative 04/04/2016 1  % Final  . Basophils Absolute 04/04/2016 0.1  0 - 0.1 K/uL Final  . Sodium 04/04/2016 138  135 - 145 mmol/L Final  . Potassium 04/04/2016 4.2  3.5 - 5.1 mmol/L Final  . Chloride 04/04/2016 104  101 - 111 mmol/L Final  . CO2 04/04/2016 27  22 - 32 mmol/L Final  . Glucose, Bld 04/04/2016 87  65 - 99 mg/dL Final  . BUN 04/04/2016 17  6 - 20 mg/dL Final  . Creatinine, Ser 04/04/2016 0.71  0.44 - 1.00 mg/dL Final  . Calcium 04/04/2016 9.6  8.9 - 10.3 mg/dL Final  . Total Protein 04/04/2016 7.7  6.5 - 8.1 g/dL Final  . Albumin 04/04/2016 4.1  3.5 - 5.0 g/dL Final  . AST 04/04/2016 21  15 - 41 U/L Final  . ALT 04/04/2016 15  14 - 54 U/L Final  . Alkaline Phosphatase 04/04/2016 75  38 - 126 U/L Final  . Total Bilirubin 04/04/2016 0.8  0.3 - 1.2 mg/dL Final  . GFR calc non Af Amer 04/04/2016 >60  >60 mL/min Final  . GFR calc Af Amer 04/04/2016 >60  >60 mL/min Final   Comment: (NOTE) The eGFR has been calculated using the CKD EPI equation. This calculation has not been validated in all clinical situations. eGFR's persistently <60 mL/min signify possible Chronic Kidney Disease.   . Anion gap 04/04/2016 7  5 - 15 Final    Assessment:  Melinda Tanner is a 46 y.o. female with  metastatic breast cancer to brain and lymph nodes.  She initially presented in 2009 while living in [place] with multi-focal right breast cancer with positive lymph node(s) which was ER+, PR+(low), and HER2/neu+. She received neoadjuvant chemotherapy (AC x 4 every 3 weeks followed by Taxol/Abraxane + carboplatin weekly x 12) without anti-HER2 treatment.    On 07/14/2008, she underwent bilateral mastectomies followed by reconstruction.  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   Zero of 11 lymph nodes on the right were positive for malignancy. 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  Deep margin was negative. Zero of 2 lymph nodes were positive for metastatic disease.  She received adjuvant radiation to the right breast.  She received adjuvant tamoxifen and Zoladex.  She could not tolerate symptoms of joint pain and tamoxifen was discontinued after 1-2 months.  Zoladex was continued for approximately 1.5 years.  She was diagnosed with brain metastases in 02/2011.  Pathology revealed ER+, PR+(low), and HER2/neu-.  She underwent resection followed by [facility].  On 03/09/2011, original breast tissue (09/03/2007) sent to [facility] testing.  Testing revealed ER+, PR+(borderline), and HER2/neu positive (different than initial testing).  She received Herceptin for 1 year beginning in 2013.  She then began Tykerb and Xeloda after completion of Herceptin (2014).  She was on extremely low, and probably sub-therapeutic, dosing of lapatinib + capecitabine.   She developed right supraclavicular adenopathy in 2015. She was noted to have slow disease progression in the right frontal/parietal lesion with minimal presence of systemic disease on PET scans. Capecitabine + lapatinib were discontinued in 04/2014.   She underwent repeat craniotomy with resection of brain metastasis on 09/04/2014.  Biopsy of the right supraclavicular lymph node  was  ER+,PR+,HER2/neu-.  She started tamoxifen in 09/2014, but discontinued it secondary to pain in hip, back, and shoulder.  She restarted tamoxifen on 04/23/2015.  Tamoxifen was discontinued on 08/23/2015 secondary to progressive disease.  She received Zoladex on 04/30/2015.  She underwent laparoscopic bilateral oophorectomy on 05/18/2015.  She receives monthly Xgeva (began 04/30/2015; last 01/10/2016).  She is postmenopausal.  She began Faslodex on 08/23/2015 (last 03/06/2016).  She is currently day 3 of cycle #5  Ibrance (09/20/2015 - 04/02/2016).  She was off 1 month secondary to coverage issues.  Patient received palliative radiation 30 Gy to the left humerus and lumbar spine from 08/25/2015 - 09/09/2015  Head MRI on 03/06/2016 revealed an unchanged small focus of nodular enhancement at the right frontal resection site.  There was unchanged appearance of the 2 other resection sites without abnormal enhancement.  There was no evidence of new intracranial metastases.    PET scan on 02/18/2016 revealed a mixed response to therapy.  There had been improvement in right cervical lymphadenopathy and in several osseous lesions.  There were several other osseous lesions in the pelvis with increasing hypermetabolism compared to the prior study. As the majority of the osseous lesions were increasingly sclerotic, some of this hypermetabolism could simply reflect bony healing.  There was no new extraskeletal metastatic disease is noted in the neck, chest, abdomen or pelvis.  CA27.29 was 177.8 on 04/16/2015, 276.4 on 05/28/2015, 287.5 on 07/13/2015, 333.8 on 07/26/2015, 412.9 on 08/23/2015, 315.6 on 09/20/2015, 188.8 on 10/18/2015, 151.5 on 11/15/2015, 122.7 on 12/10/2015, 94.6 on 01/10/2016, 80.9 on 02/07/2016, 79 on 03/06/2016, and 84.8 on 04/04/2016.  Bone density study on 05/24/2015 revealed a T-score of -0.8 in the right femoral neck (normal).  She has a normocytic anemia due to treatment Melinda Tanner, radiation)  and B12 deficiency.  Work-up on 10/25/2015 revealed a B12 of 141 (low).  B12 was 205 on 12/13/2015 with an MMA of 171 (normal) on 01/10/2016.  Ferritin was 43. Iron studies included a saturation of 13% and a TIBC of 354. TSH and folate were normal.  Reticulocyte count was 2.2%.  She declined B12 injections.  She started oral B12 1000 mcg on 11/05/2015.  She takes her B12 sporadically.  B12 level was 205 (low normal) on 12/13/2015.  Diet is good.  She denies any melena or hematochezia.  Symptomatically, she is doing well.  She has some mild epigastric discomfort.  She is on Protonix.  Counts are normal after being off Ibrance for 1 month.  She receives Faslodex and Xegva monthly.  Calcium is 9.6.  Plan: 1.  Labs today:  CBC with diff, CMP, B12, CA27.29. 2.  Review scans from last month and tumor marker.  Discuss plan for follow-up imaging. 3.  Xgeva today. 4.  Faslodex today. 5.  Continue Ibrance. 6.  RTC in 1 month for MD assessment, labs (CBC with diff, CMP, CA27.29), Xgeva + Faslodex.   Lequita Asal, MD  04/04/2016, 11:06 AM

## 2016-04-05 LAB — CANCER ANTIGEN 27.29: CA 27.29: 84.8 U/mL — ABNORMAL HIGH (ref 0.0–38.6)

## 2016-04-14 NOTE — Progress Notes (Unsigned)
PSN sent a message to the interpreter's  Department, asking that they contact patient to set up a time to meet to apply for medication assistance for the drug, Ibrance.

## 2016-04-20 NOTE — Progress Notes (Unsigned)
PSN had Melinda Tanner in the interpreter's office call patient, asking that she come in to complete assistance forms for the drug, Ibrance.  A message was left since patient did not answer.    PSN awaiting a return call from patient.

## 2016-05-02 ENCOUNTER — Inpatient Hospital Stay (HOSPITAL_BASED_OUTPATIENT_CLINIC_OR_DEPARTMENT_OTHER): Payer: Medicare HMO | Admitting: Hematology and Oncology

## 2016-05-02 ENCOUNTER — Inpatient Hospital Stay: Payer: Medicare HMO | Attending: Hematology and Oncology

## 2016-05-02 ENCOUNTER — Encounter: Payer: Self-pay | Admitting: Hematology and Oncology

## 2016-05-02 ENCOUNTER — Telehealth: Payer: Self-pay | Admitting: *Deleted

## 2016-05-02 ENCOUNTER — Inpatient Hospital Stay: Payer: Medicare HMO

## 2016-05-02 VITALS — BP 139/86 | HR 82 | Temp 96.7°F | Resp 18 | Wt 159.2 lb

## 2016-05-02 DIAGNOSIS — Z780 Asymptomatic menopausal state: Secondary | ICD-10-CM

## 2016-05-02 DIAGNOSIS — Z9221 Personal history of antineoplastic chemotherapy: Secondary | ICD-10-CM | POA: Insufficient documentation

## 2016-05-02 DIAGNOSIS — Z9013 Acquired absence of bilateral breasts and nipples: Secondary | ICD-10-CM | POA: Diagnosis not present

## 2016-05-02 DIAGNOSIS — Z95828 Presence of other vascular implants and grafts: Secondary | ICD-10-CM

## 2016-05-02 DIAGNOSIS — Z79899 Other long term (current) drug therapy: Secondary | ICD-10-CM | POA: Diagnosis not present

## 2016-05-02 DIAGNOSIS — Z170 Estrogen receptor positive status [ER+]: Secondary | ICD-10-CM | POA: Diagnosis not present

## 2016-05-02 DIAGNOSIS — C7931 Secondary malignant neoplasm of brain: Secondary | ICD-10-CM | POA: Insufficient documentation

## 2016-05-02 DIAGNOSIS — C7951 Secondary malignant neoplasm of bone: Secondary | ICD-10-CM | POA: Diagnosis not present

## 2016-05-02 DIAGNOSIS — C50919 Malignant neoplasm of unspecified site of unspecified female breast: Secondary | ICD-10-CM

## 2016-05-02 DIAGNOSIS — C50911 Malignant neoplasm of unspecified site of right female breast: Secondary | ICD-10-CM

## 2016-05-02 DIAGNOSIS — Z923 Personal history of irradiation: Secondary | ICD-10-CM

## 2016-05-02 LAB — CBC WITH DIFFERENTIAL/PLATELET
Basophils Absolute: 0.1 10*3/uL (ref 0–0.1)
Basophils Relative: 2 %
Eosinophils Absolute: 0 10*3/uL (ref 0–0.7)
Eosinophils Relative: 1 %
HCT: 29.6 % — ABNORMAL LOW (ref 35.0–47.0)
Hemoglobin: 10.2 g/dL — ABNORMAL LOW (ref 12.0–16.0)
Lymphocytes Relative: 25 %
Lymphs Abs: 0.9 10*3/uL — ABNORMAL LOW (ref 1.0–3.6)
MCH: 33.3 pg (ref 26.0–34.0)
MCHC: 34.4 g/dL (ref 32.0–36.0)
MCV: 96.8 fL (ref 80.0–100.0)
Monocytes Absolute: 0.5 10*3/uL (ref 0.2–0.9)
Monocytes Relative: 14 %
Neutro Abs: 2.2 10*3/uL (ref 1.4–6.5)
Neutrophils Relative %: 58 %
Platelets: 263 10*3/uL (ref 150–440)
RBC: 3.06 MIL/uL — ABNORMAL LOW (ref 3.80–5.20)
RDW: 20 % — ABNORMAL HIGH (ref 11.5–14.5)
WBC: 3.7 10*3/uL (ref 3.6–11.0)

## 2016-05-02 LAB — COMPREHENSIVE METABOLIC PANEL
ALT: 11 U/L — ABNORMAL LOW (ref 14–54)
AST: 20 U/L (ref 15–41)
Albumin: 4 g/dL (ref 3.5–5.0)
Alkaline Phosphatase: 59 U/L (ref 38–126)
Anion gap: 8 (ref 5–15)
BUN: 13 mg/dL (ref 6–20)
CO2: 25 mmol/L (ref 22–32)
Calcium: 9.3 mg/dL (ref 8.9–10.3)
Chloride: 103 mmol/L (ref 101–111)
Creatinine, Ser: 0.6 mg/dL (ref 0.44–1.00)
GFR calc Af Amer: >60 mL/min (ref >60)
GFR calc non Af Amer: >60 mL/min (ref >60)
Glucose, Bld: 103 mg/dL — ABNORMAL HIGH (ref 65–99)
Potassium: 3.5 mmol/L (ref 3.5–5.1)
Sodium: 136 mmol/L (ref 135–145)
Total Bilirubin: 0.6 mg/dL (ref 0.3–1.2)
Total Protein: 7.6 g/dL (ref 6.5–8.1)

## 2016-05-02 MED ORDER — DENOSUMAB 120 MG/1.7ML ~~LOC~~ SOLN
120.0000 mg | Freq: Once | SUBCUTANEOUS | Status: AC
  Administered 2016-05-02: 120 mg via SUBCUTANEOUS
  Filled 2016-05-02: qty 1.7

## 2016-05-02 MED ORDER — HEPARIN SOD (PORK) LOCK FLUSH 100 UNIT/ML IV SOLN
500.0000 [IU] | Freq: Once | INTRAVENOUS | Status: AC
  Administered 2016-05-02: 500 [IU] via INTRAVENOUS

## 2016-05-02 MED ORDER — SODIUM CHLORIDE 0.9% FLUSH
10.0000 mL | INTRAVENOUS | Status: DC | PRN
  Administered 2016-05-02: 10 mL via INTRAVENOUS
  Filled 2016-05-02: qty 10

## 2016-05-02 MED ORDER — FULVESTRANT 250 MG/5ML IM SOLN
500.0000 mg | INTRAMUSCULAR | Status: DC
  Administered 2016-05-02: 500 mg via INTRAMUSCULAR
  Filled 2016-05-02: qty 10

## 2016-05-02 NOTE — Progress Notes (Signed)
Patient received letter from [employer] regarding her [facility] prescription.  Brought it today for physician review.

## 2016-05-02 NOTE — Progress Notes (Signed)
[facility] day:  05/02/16   Chief Complaint: Melinda Tanner is a 46 y.o. 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   HPI:  The patient was last seen in the medical oncology clinic on 04/04/2016.  At that time, she was doing well.  Exam was unremarkable.  CA 27.29 was 84.8.  She had just started her Ibrance after being off for almost a month.  She received Faslodex and Xgeva.  During the interim, she got approved for Ibrance through 03/19/2017.  She was supposed to have started on 04/30/2016.  She is currently off Ibrance .    Overall, she feels good.  She denies any pain.  She is active.   Past Medical History:  Diagnosis Date  . Brain cancer ([place]) 2012   Met. from Breast  . Breast cancer ([place]) 2009  . Complication of anesthesia    nausea, "drops in potassium and magnesium"  . Seizures ([place])     Past Surgical History:  Procedure Laterality Date  . BRAIN SURGERY  2012, 2106  . CESAREAN SECTION    . LAPAROSCOPIC BILATERAL SALPINGO OOPHERECTOMY Bilateral 05/18/2015   Procedure: LAPAROSCOPIC BILATERAL SALPINGO OOPHORECTOMY;  Surgeon: Will Bonnet, MD;  Location: [facility] ORS;  Service: Gynecology;  Laterality: Bilateral;  . MASTECTOMY Bilateral 2010    Family History  Problem Relation Age of Onset  . Cancer Paternal Aunt   . Cancer Paternal Uncle   . Cancer Paternal Grandfather   . Hypertension Brother   . Diabetes Paternal Grandmother   A paternal aunt had breast cancer age 44, a paternal uncle had prostate cancer, and a paternal grandfather had leukemia.  Social History:  reports that she has never smoked. She does not have any smokeless tobacco history on file. She reports that she does not drink alcohol or use drugs.  She is from [place].  She moved to [place] in 2015.  She moved to [place] in 04/2014.  She recently moved into the [place] area.  She has 2 children (boy and girl).  Her family will likely be moving to [facility] in December.  They are looking for a home.  The patient is accompanied by the Spanish interpreter today.  Allergies:  Allergies  Allergen Reactions  . Taxol [Paclitaxel] Anaphylaxis  . Aspirin Swelling  . Penicillins Swelling  . Zofran [Ondansetron Hcl] Nausea And Vomiting    Current Medications: Current Outpatient Prescriptions  Medication Sig Dispense Refill  . Cyanocobalamin (VITAMIN B-12 PO) Take by mouth.    Marland Kitchen omeprazole (PRILOSEC) 20 MG capsule Take 20 mg by mouth daily.  2  . oxycodone (OXY-IR) 5 MG capsule Take 1 capsule (5 mg total) by mouth every 4 (four) hours as needed. (Patient not taking: Reported on 04/04/2016) 30 capsule 0  . palbociclib (IBRANCE) 100 MG capsule Take 1 capsule daily for 21 days and then take 1 week off and repeat cycle again (Take whole with food.) (Patient not taking: Reported on 05/02/2016) 21 capsule 1  . pantoprazole (PROTONIX) 20 MG tablet TK 1 T PO D  3  . potassium chloride SA (K-DUR,KLOR-CON) 20 MEQ tablet Take 1 tablet (20 mEq total) by mouth 2 (two) times daily. for 1 days then 1 pill a day x 2 days. (Patient not taking: Reported on 04/04/2016) 10 tablet 0  No current facility-administered medications for this visit.    Facility-Administered Medications Ordered in Other Visits  Medication Dose Route Frequency Provider Last Rate Last Dose  . sodium chloride flush (NS) 0.9 % injection 10 mL  10 mL Intravenous PRN Lloyd Huger, MD   10 mL at 05/02/16 2876    Review of Systems:  GENERAL:  Feels good.  No fevers or sweats.  Weight stable. PERFORMANCE STATUS (ECOG):  1 HEENT:  No visual changes, runny nose, sore throat, mouth sores or tenderness. Lungs: No shortness of breath or cough.  No hemoptysis. Cardiac:  No chest pain, palpitations, orthopnea, or PND. GI:  No nausea, vomiting,  diarrhea, constipation, melena or hematochezia.  Epigastric discomfort at times. GU:  No urgency, frequency, dysuria or hematuria. Musculoskeletal: No none or joint pain.  No muscle tenderness. Extremities:  No pain or swelling. Skin:  No rashes or irritation. Neuro:  No headache, numbness or weakness, balance or coordination issues.  Endocrine:  No diabetes, thyroid issues, hot flashes or night sweats. Psych:  No mood changes, depression or anxiety. Pain:  No focal pain. Review of systems:  All other systems reviewed and found to be negative.  Physical Exam: Blood pressure 139/86, pulse 82, temperature (!) 96.7 F (35.9 C), resp. rate 18, weight 159 lb 2.8 oz (72.2 kg), last menstrual period 03/19/2015. GENERAL:  Well developed, well nourished, woman sitting comfortably in the exam room in no acute distress.  MENTAL STATUS:  Alert and oriented to person, place and time. HEAD: Wearing a pink cap.  Normocephalic, atraumatic, face symmetric, no Cushingoid features. EYES:  Brown eyes. Pupils equal round and reactive to light and accomodation.  No conjunctivitis or scleral icterus. ENT:  Oropharynx clear without lesion.  Tongue normal. Mucous membranes moist.  RESPIRATORY:  Clear to auscultation without rales, wheezes or rhonchi. CARDIOVASCULAR:  Regular rate and rhythm without murmur, rub or gallop. ABDOMEN:  Soft, non-tender, with active bowel sounds, and no hepatosplenomegaly.  No masses. SKIN:  No rashes, bruises or ulcers. EXTREMITIES:  No edema, no skin discoloration or tenderness.  No palpable cords. LYMPH NODES:  No palpable cervical, supraclavicular, axillary or inguinal adenopathy  NEUROLOGICAL:  Appropriate. PSYCH:  Appropriate.     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 No lymph invasion. Tumor cellularity 20%. [id number]. 07/13/2008: Left mastectomy: Residual ductal carcinoma in situ, high nuclear grade, deep margin negative ypTisNO(sn)MX. 10/25/2010: Abdominal and pelvic ultrasound: questionable lesion near gallbladder fossa recommend MRI follow up. 03/02/2011: Left and right frontal excisional brain biopsy: Adenocarcinoma, metastatic, NOS: ER +, PR 5% +, Her 2 NEG. 03/04/2011: [facility] testing of IHC4 residual recurrence risk score of 114 showing an 8 year recurrence reate of 57%. ER POSTIVE, PR POSITIVE, HER 2 POSTIVE (previously documented negative in 2009) cutoff of 361 patient scores 426. ki67 71%.  08/05/2013: Right breast FNA supraclavicular region: positive for metastatic carcinoma. ER/PR/HER2 not reported. 08/19/2014: Right cervical lymph node. Adenocarcinoma with features consistent with metastatic breast carcinoma. ER/PR/HER2 insufficient tissue. 09/04/2014: Repeat craniotomy, resection of right frontal tumor (metastatic breast cancer). ER 90-100% PR 51-60% HER2 - 09/29/2014: Right cervical lymph node, metastatic carcinoma c/w breast primary, ER 100% PR 20% HER2-  Imaging: 08/15/2007:  Bilateral Mammogram: Dense breasts with solid lesion at 6:00, 7:00, and 9:00 of right breast, solid lesion at 2:00 position left breast BIRADS 4B. 09/10/2007: Breast MRI: Three solid irregular enhancing lesions of the right breast 2.1 x 3.2, 2.8 x 2.7, and 1.8 x 1.6 cm. Mildly enlarged enhancing nodule right axilla. Left breast clumped enhancement midportion suspicious for malignancy. 03/01/2011: Brain MRI: At least two juxtacortical, intra-axial masses with extensive surrounding vasogenic edema most consistent with intracranial metastasis. 07/22/2013: Brain MRI +/- contrast: interval increase in enhancing component on  T2 FLAIR now measuring 1.0cm x 1.1 cm worrisome for progression of disease. 01/21/2014:  PET scan: Hypermetabolic small right supraclavicular and right upper paratracheal lymph nodes suspicious for mets. Hypermetabolic lymph node in the right paratracheal upper mediastinum that measure approximately 0.9 x 0.5cm. (of note no impressions made of areas noted on brain MRI 07/22/13).  01/21/2014: Head MRI: Right frontal enhancing lesion has shown interval increase in size with surrounding edema and local mass effect (measured 1.8x2.1x1.6, previously 1.2x1.1x1.0) 07/16/2014: Head MRI:  Right anterior frontal enhancing mass 2.5 x 2.0 cm compatible with a metastatic lesion has increased and changed in configuration from previous 2.0 x 1.8 cm, formerly multilobular. 08/06/2014: PET scan:  Multiple bilateral FDG avid low cervical, supraclavicular, upper mediastinal, and right internal mammary lymphadenopathy, likely representing metastatic disease. A cervical node in the right lower neck should be amenable to percutaneous sampling.  12/2014: Head MRI:  Evolution of right frontal postoperative changes status post tumor resection at the vertex. Expected postoperative changes. Minimal marginal enhancement resection bed with some adjacent posterior intrinsic T1 signal again seen. Decreasing T2/FLAIR adjacent parenchymal changes.  Stable resection cavity is left posterior frontal and right parieto-occipital regions. 04/20/2015 : PET scan: Cervical and thoracic nodal metastasis.  There was multifocal osseous metastasis (left transverse T4 process, left humeral head, right iliac wing, and left side of the sacrum).  Incidental findings included right nephrolithiasis.   04/22/2015: Bone scan: Corresponded with PET-CT with metastatic lesions evident in the left humeral head, left posterior aspect of T4, left aspect of S1, and the right iliac crest.  There was no other definite foci of metastatic disease to bone.  Uptake in the  skull was most suggestive of the previous right frontal craniotomy. 05/24/2015: Bone density:  T-score of -0.8 in the right femoral neck (normal). 07/07/2015:  Head MRI:  Metastatic breast cancer with 3 resection cavities. The anterior right frontal cavity was positive for nodular marginal enhancement, new from brain MRI report 01/07/2015, and consistent with recurrent disease.  08/12/2015:  Lumbar Spine MRI: Osseous metastatic disease at L3, L4, L5, S1, and in the left iliac bone posteriorly. There was no definite epidural metastatic disease. 08/20/2015:  PET scan:  Response to therapy in the lower cervical and thoracic adenopathy.  There was a mixed response to multifocal osseous metastases (some new lesions).  12/06/2015:  Head MRI:  Decreased nodular contrast enhancement at the right frontal lobe treatment site.  There was unchanged appearance of treatment sites within the bilateral parietal lobe size without residual or recurrent disease.  There were no new metastatic lesions. 02/18/2016: PET scan: Mixed response to therapy. There hadbeen improvement in right cervical lymphadenopathy and in several osseous lesions. There wereseveral other osseous lesions in the pelvis with increasing hypermetabolism compared to the prior study. As the majority of the osseous lesions wereincreasingly sclerotic, some of this hypermetabolism could simply reflect bony healing. There was no new extraskeletal metastatic disease is noted in the neck,  chest, abdomen or pelvis. 03/06/2016: Head MRI: Unchanged small focus of nodular enhancement at the right frontal resection site. There was unchanged appearance of the 2 other resection sites without abnormal enhancement. There was no evidence of new intracranial metastases.   Labs:  Appointment on 05/02/2016  Component Date Value Ref Range Status  . WBC 05/02/2016 3.7  3.6 - 11.0 K/uL Final  . RBC 05/02/2016 3.06* 3.80 - 5.20 MIL/uL Final  . Hemoglobin  05/02/2016 10.2* 12.0 - 16.0 g/dL Final  . HCT 05/02/2016 29.6* 35.0 - 47.0 % Final  . MCV 05/02/2016 96.8  80.0 - 100.0 fL Final  . MCH 05/02/2016 33.3  26.0 - 34.0 pg Final  . MCHC 05/02/2016 34.4  32.0 - 36.0 g/dL Final  . RDW 05/02/2016 20.0* 11.5 - 14.5 % Final  . Platelets 05/02/2016 263  150 - 440 K/uL Final  . Neutrophils Relative % 05/02/2016 58  % Final  . Neutro Abs 05/02/2016 2.2  1.4 - 6.5 K/uL Final  . Lymphocytes Relative 05/02/2016 25  % Final  . Lymphs Abs 05/02/2016 0.9* 1.0 - 3.6 K/uL Final  . Monocytes Relative 05/02/2016 14  % Final  . Monocytes Absolute 05/02/2016 0.5  0.2 - 0.9 K/uL Final  . Eosinophils Relative 05/02/2016 1  % Final  . Eosinophils Absolute 05/02/2016 0.0  0 - 0.7 K/uL Final  . Basophils Relative 05/02/2016 2  % Final  . Basophils Absolute 05/02/2016 0.1  0 - 0.1 K/uL Final  . Sodium 05/02/2016 136  135 - 145 mmol/L Final  . Potassium 05/02/2016 3.5  3.5 - 5.1 mmol/L Final  . Chloride 05/02/2016 103  101 - 111 mmol/L Final  . CO2 05/02/2016 25  22 - 32 mmol/L Final  . Glucose, Bld 05/02/2016 103* 65 - 99 mg/dL Final  . BUN 05/02/2016 13  6 - 20 mg/dL Final  . Creatinine, Ser 05/02/2016 0.60  0.44 - 1.00 mg/dL Final  . Calcium 05/02/2016 9.3  8.9 - 10.3 mg/dL Final  . Total Protein 05/02/2016 7.6  6.5 - 8.1 g/dL Final  . Albumin 05/02/2016 4.0  3.5 - 5.0 g/dL Final  . AST 05/02/2016 20  15 - 41 U/L Final  . ALT 05/02/2016 11* 14 - 54 U/L Final  . Alkaline Phosphatase 05/02/2016 59  38 - 126 U/L Final  . Total Bilirubin 05/02/2016 0.6  0.3 - 1.2 mg/dL Final  . GFR calc non Af Amer 05/02/2016 >60  >60 mL/min Final  . GFR calc Af Amer 05/02/2016 >60  >60 mL/min Final   Comment: (NOTE) The eGFR has been calculated using the CKD EPI equation. This calculation has not been validated in all clinical situations. eGFR's persistently <60 mL/min signify possible Chronic Kidney Disease.   . Anion gap 05/02/2016 8  5 - 15 Final    Assessment:   Melinda Tanner is a 46 y.o. 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  She initially presented in 2009 while living in [place] with multi-focal right breast cancer with positive lymph node(s) which was ER+, PR+(low), and HER2/neu+. She received neoadjuvant chemotherapy (AC x 4 every 3 weeks followed by Taxol/Abraxane + carboplatin weekly x 12) without anti-HER2 treatment.    On 07/14/2008, she underwent bilateral mastectomies followed by reconstruction.  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   Zero of 11 lymph nodes on the right were positive for malignancy. 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 Deep margin was negative. Zero of 2 lymph nodes were positive for metastatic disease.  She received adjuvant radiation to the right breast.  She received adjuvant tamoxifen and Zoladex.  She could not tolerate symptoms of joint pain and tamoxifen was discontinued after 1-2 months.  Zoladex was continued for approximately 1.5 years.  She was diagnosed with brain metastases in 02/2011.  Pathology revealed ER+, PR+(low), and HER2/neu-.  She underwent resection followed by [facility].  On 03/09/2011, original breast tissue (09/03/2007) sent to [facility] testing.  Testing revealed ER+, PR+(borderline), and HER2/neu positive (different than initial testing).  She received Herceptin for 1 year beginning in 2013.  She then began Tykerb and Xeloda after completion of Herceptin (2014).  She was on extremely low, and probably sub-therapeutic, dosing of lapatinib + capecitabine.   She developed right supraclavicular adenopathy in 2015. She was noted to have slow disease progression in the right frontal/parietal lesion with minimal presence of systemic disease on PET scans. Capecitabine + lapatinib were discontinued in 04/2014.   She underwent repeat craniotomy with resection of brain metastasis on 09/04/2014.  Biopsy of the  right supraclavicular lymph node  was ER+,PR+,HER2/neu-.  She started tamoxifen in 09/2014, but discontinued it secondary to pain in hip, back, and shoulder.  She restarted tamoxifen on 04/23/2015.  Tamoxifen was discontinued on 08/23/2015 secondary to progressive disease.  She received Zoladex on 04/30/2015.  She underwent laparoscopic bilateral oophorectomy on 05/18/2015.  She receives monthly Xgeva (began 04/30/2015; last 04/04/2016).  She is post-menopausal.  She began Faslodex on 08/23/2015 (last 04/04/2016).  She is s/p 5 cycles of Ibrance (09/20/2015 - 04/02/2016).  Patient received palliative radiation 30 Gy to the left humerus and lumbar spine from 08/25/2015 - 09/09/2015  CA27.29 has been followed: 177.8 on 04/16/2015, 276.4 on 05/28/2015, 287.5 on 07/13/2015, 333.8 on 07/26/2015, 412.9 on 08/23/2015, 315.6 on 09/20/2015, 188.8 on 10/18/2015, 151.5 on 11/15/2015, 122.7 on 12/10/2015, 94.6 on 01/10/2016, 80.9 on 02/07/2016, 79 on 03/06/2016, 84.8 on 04/04/2016, and 85.2 on 02/13/218.  Bone density study on 05/24/2015 revealed a T-score of -0.8 in the right femoral neck (normal).  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  Work-up on 10/25/2015 revealed a B12 of 141 (low).  B12 was 205 on 12/13/2015 with an MMA of 171 (normal) on 01/10/2016.  Ferritin was 43. Iron studies included a saturation of 13% and a TIBC of 354. TSH and folate were normal.  Reticulocyte count was 2.2%.  She declined B12 injections.  She started oral B12 1000 mcg on 11/05/2015.  She takes her B12 sporadically.  B12 level was 205 (low normal) on 12/13/2015.  Diet is good.  She denies any melena or hematochezia.  Symptomatically, feels good.  She denies any pain.  She has a mild normocytic anemia.  ANC is good. She receives Faslodex and Xegva monthly.  Calcium is 9.3.  Plan: 1.  Labs today:  CBC with diff, CMP, B12, CA27.29. 2.  Discuss CA27.29 variability between last 2  cycles.  Discuss results from last PET scan.  Follow-up bone scan prior to next appointment. 3.  Xgeva today. 4.  Faslodex today. 5.  Continue Ibrance when arrives (3 weeks on/1 week off). 6.  Schedule bone scan in 3 weeks. 7.  RTC in 1 month for MD assessment, labs (CBC with diff, CMP, CA27.29), Xgeva + Faslodex.   Lequita Asal, MD  05/02/2016, 11:16 AM

## 2016-05-02 NOTE — Telephone Encounter (Signed)
The patient has a paper from [employer] pt asst. And has been approved for ibrance through 03/19/2017. She has not got new rx yet. I called the company and spoke to [place] and she states it will not automatically send out. Either the patient or the md office can call and tell them to send next rx each time.  I went and told the patient that they will send shipment out today and it will arrive in 1-2 days and then after that she will need to call to get refills shipped out. She is agreeable to the plan

## 2016-05-03 LAB — CANCER ANTIGEN 27.29: CA 27.29: 85.2 U/mL — ABNORMAL HIGH (ref 0.0–38.6)

## 2016-05-16 ENCOUNTER — Encounter: Payer: Self-pay | Admitting: Hematology and Oncology

## 2016-05-23 ENCOUNTER — Encounter
Admission: RE | Admit: 2016-05-23 | Discharge: 2016-05-23 | Disposition: A | Discharge status: Home / Self Care | Payer: Medicare HMO | Source: Ambulatory Visit | Attending: Hematology and Oncology | Admitting: Hematology and Oncology

## 2016-05-23 DIAGNOSIS — C50919 Malignant neoplasm of unspecified site of unspecified female breast: Secondary | ICD-10-CM

## 2016-05-23 DIAGNOSIS — C7951 Secondary malignant neoplasm of bone: Secondary | ICD-10-CM | POA: Insufficient documentation

## 2016-05-23 MED ORDER — TECHNETIUM TC 99M MEDRONATE IV KIT
25.0000 | PACK | Freq: Once | INTRAVENOUS | Status: AC | PRN
Start: 2016-05-23 — End: 2016-05-23
  Administered 2016-05-23: 20.85 via INTRAVENOUS

## 2016-05-30 ENCOUNTER — Inpatient Hospital Stay: Payer: Medicare HMO | Attending: Hematology and Oncology

## 2016-05-30 ENCOUNTER — Inpatient Hospital Stay: Payer: Medicare HMO

## 2016-05-30 ENCOUNTER — Other Ambulatory Visit: Payer: Self-pay | Admitting: Hematology and Oncology

## 2016-05-30 ENCOUNTER — Inpatient Hospital Stay (HOSPITAL_BASED_OUTPATIENT_CLINIC_OR_DEPARTMENT_OTHER): Payer: Medicare HMO | Admitting: Hematology and Oncology

## 2016-05-30 ENCOUNTER — Encounter: Payer: Self-pay | Admitting: Hematology and Oncology

## 2016-05-30 VITALS — BP 111/76 | HR 93 | Temp 97.9°F | Resp 18 | Wt 160.1 lb

## 2016-05-30 DIAGNOSIS — E538 Deficiency of other specified B group vitamins: Secondary | ICD-10-CM | POA: Insufficient documentation

## 2016-05-30 DIAGNOSIS — Z79899 Other long term (current) drug therapy: Secondary | ICD-10-CM | POA: Insufficient documentation

## 2016-05-30 DIAGNOSIS — Z9013 Acquired absence of bilateral breasts and nipples: Secondary | ICD-10-CM | POA: Insufficient documentation

## 2016-05-30 DIAGNOSIS — C50911 Malignant neoplasm of unspecified site of right female breast: Secondary | ICD-10-CM | POA: Diagnosis not present

## 2016-05-30 DIAGNOSIS — C7931 Secondary malignant neoplasm of brain: Secondary | ICD-10-CM | POA: Insufficient documentation

## 2016-05-30 DIAGNOSIS — Z9221 Personal history of antineoplastic chemotherapy: Secondary | ICD-10-CM | POA: Insufficient documentation

## 2016-05-30 DIAGNOSIS — Z923 Personal history of irradiation: Secondary | ICD-10-CM | POA: Diagnosis not present

## 2016-05-30 DIAGNOSIS — Z170 Estrogen receptor positive status [ER+]: Secondary | ICD-10-CM

## 2016-05-30 DIAGNOSIS — C7951 Secondary malignant neoplasm of bone: Secondary | ICD-10-CM

## 2016-05-30 DIAGNOSIS — Z780 Asymptomatic menopausal state: Secondary | ICD-10-CM | POA: Insufficient documentation

## 2016-05-30 DIAGNOSIS — D649 Anemia, unspecified: Secondary | ICD-10-CM | POA: Insufficient documentation

## 2016-05-30 DIAGNOSIS — C50919 Malignant neoplasm of unspecified site of unspecified female breast: Secondary | ICD-10-CM

## 2016-05-30 LAB — COMPREHENSIVE METABOLIC PANEL
ALT: 12 U/L — ABNORMAL LOW (ref 14–54)
AST: 25 U/L (ref 15–41)
Albumin: 4 g/dL (ref 3.5–5.0)
Alkaline Phosphatase: 60 U/L (ref 38–126)
Anion gap: 8 (ref 5–15)
BUN: 16 mg/dL (ref 6–20)
CO2: 26 mmol/L (ref 22–32)
Calcium: 9 mg/dL (ref 8.9–10.3)
Chloride: 104 mmol/L (ref 101–111)
Creatinine, Ser: 0.64 mg/dL (ref 0.44–1.00)
GFR calc Af Amer: >60 mL/min (ref >60)
GFR calc non Af Amer: >60 mL/min (ref >60)
Glucose, Bld: 111 mg/dL — ABNORMAL HIGH (ref 65–99)
Potassium: 4 mmol/L (ref 3.5–5.1)
Sodium: 138 mmol/L (ref 135–145)
Total Bilirubin: 0.5 mg/dL (ref 0.3–1.2)
Total Protein: 7.6 g/dL (ref 6.5–8.1)

## 2016-05-30 LAB — CBC WITH DIFFERENTIAL/PLATELET
Basophils Absolute: 0.1 10*3/uL (ref 0–0.1)
Basophils Relative: 2 %
Eosinophils Absolute: 0 10*3/uL (ref 0–0.7)
Eosinophils Relative: 1 %
HCT: 30.4 % — ABNORMAL LOW (ref 35.0–47.0)
Hemoglobin: 10.5 g/dL — ABNORMAL LOW (ref 12.0–16.0)
Lymphocytes Relative: 31 %
Lymphs Abs: 0.8 10*3/uL — ABNORMAL LOW (ref 1.0–3.6)
MCH: 33.9 pg (ref 26.0–34.0)
MCHC: 34.5 g/dL (ref 32.0–36.0)
MCV: 98.1 fL (ref 80.0–100.0)
Monocytes Absolute: 0.2 10*3/uL (ref 0.2–0.9)
Monocytes Relative: 9 %
Neutro Abs: 1.4 10*3/uL (ref 1.4–6.5)
Neutrophils Relative %: 57 %
Platelets: 235 10*3/uL (ref 150–440)
RBC: 3.1 MIL/uL — ABNORMAL LOW (ref 3.80–5.20)
RDW: 20.8 % — ABNORMAL HIGH (ref 11.5–14.5)
WBC: 2.5 10*3/uL — ABNORMAL LOW (ref 3.6–11.0)

## 2016-05-30 MED ORDER — DENOSUMAB 120 MG/1.7ML ~~LOC~~ SOLN
120.0000 mg | Freq: Once | SUBCUTANEOUS | Status: AC
  Administered 2016-05-30: 120 mg via SUBCUTANEOUS
  Filled 2016-05-30: qty 1.7

## 2016-05-30 MED ORDER — FULVESTRANT 250 MG/5ML IM SOLN
500.0000 mg | INTRAMUSCULAR | Status: DC
  Administered 2016-05-30: 500 mg via INTRAMUSCULAR
  Filled 2016-05-30: qty 10

## 2016-05-30 MED ORDER — PANTOPRAZOLE SODIUM 20 MG PO TBEC: DELAYED_RELEASE_TABLET | ORAL | 3 refills | Status: DC

## 2016-05-30 NOTE — Progress Notes (Signed)
Patient offers no complaints today. 

## 2016-05-30 NOTE — Progress Notes (Signed)
[facility] day:  05/30/16   Chief Complaint: Melinda Tanner is a 46 y.o. 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   HPI:  The patient was last seen in the medical oncology clinic on 05/02/2016.  At that time, she felt good.  She denied any pain.  Exam was unremarkable.  CA 27.29 was 85.2.  She was to have started [place] when it arrived.  She received Faslodex and Xgeva.  At lat visit, we discussed slightly fluctuating CA27.29.  Decision was made to proceed with bone scan.  There was a stable focus of abnormal uptake seen in right frontal skull consistent with prior craniotomy.  There were areas of abnormal uptake identified in the left humeral head, T4 and S1 levels and right iliac crest that were present but decreased in intensity compared to prior exam suggesting possible improvement.  There was a new foci of abnormal uptake are noted in a right lower rib,T12 and L1 levels of spine concerning for metastatic disease  During the interim, she has done well.  She denies any pain.  She completed cycle #6 (05/03/2016 - 05/23/2016).  She describes being tired after her 3 weeks of Ibrance.    Past Medical History:  Diagnosis Date  . Brain cancer ([place]) 2012   Met. from Breast  . Breast cancer ([place]) 2009  . Complication of anesthesia    nausea, "drops in potassium and magnesium"  . Seizures ([place])     Past Surgical History:  Procedure Laterality Date  . BRAIN SURGERY  2012, 2106  . CESAREAN SECTION    . LAPAROSCOPIC BILATERAL SALPINGO OOPHERECTOMY Bilateral 05/18/2015   Procedure: LAPAROSCOPIC BILATERAL SALPINGO OOPHORECTOMY;  Surgeon: Will Bonnet, MD;  Location: [facility] ORS;  Service: Gynecology;  Laterality: Bilateral;  . MASTECTOMY Bilateral 2010    Family History  Problem Relation Age of Onset  .  Cancer Paternal Aunt   . Cancer Paternal Uncle   . Cancer Paternal Grandfather   . Hypertension Brother   . Diabetes Paternal Grandmother   A paternal aunt had breast cancer age 69, a paternal uncle had prostate cancer, and a paternal grandfather had leukemia.  Social History:  reports that she has never smoked. She has never used smokeless tobacco. She reports that she does not drink alcohol or use drugs.  She is from [place].  She moved to [place] in 2015.  She moved to [place] in 04/2014.  She recently moved into the [place] area.  She has 2 children (boy and girl).  Her family will likely be moving to [facility] in December.  They are looking for a Tanner.  The patient is accompanied by the Spanish interpreter today.  Allergies:  Allergies  Allergen Reactions  . Taxol [Paclitaxel] Anaphylaxis  . Aspirin Swelling  . Penicillins Swelling  . Zofran [Ondansetron Hcl] Nausea And Vomiting    Current Medications: Current Outpatient Prescriptions  Medication Sig Dispense Refill  . Cyanocobalamin (VITAMIN B-12 PO) Take by mouth.    Marland Kitchen omeprazole (PRILOSEC) 20 MG capsule Take 20 mg by mouth daily.  2  . palbociclib (IBRANCE) 100 MG capsule Take 1 capsule daily for 21 days and then take 1 week off and repeat cycle again (Take whole with food.) 21 capsule 1  . oxycodone (OXY-IR) 5 MG capsule Take 1 capsule (  5 mg total) by mouth every 4 (four) hours as needed. (Patient not taking: Reported on 04/04/2016) 30 capsule 0  . pantoprazole (PROTONIX) 20 MG tablet TK 1 T PO D  3  . potassium chloride SA (K-DUR,KLOR-CON) 20 MEQ tablet Take 1 tablet (20 mEq total) by mouth 2 (two) times daily. for 1 days then 1 pill a day x 2 days. (Patient not taking: Reported on 04/04/2016) 10 tablet 0   No current facility-administered medications for this visit.     Review of Systems:  GENERAL:  Feels good.  No fevers or sweats.  Weight up 1 pound. PERFORMANCE STATUS (ECOG):  1 HEENT:  No visual  changes, runny nose, sore throat, mouth sores or tenderness. Lungs: No shortness of breath or cough.  No hemoptysis. Cardiac:  No chest pain, palpitations, orthopnea, or PND. GI:  No nausea, vomiting, diarrhea, constipation, melena or hematochezia.  Epigastric discomfort at times (wants to start Protonix). GU:  No urgency, frequency, dysuria or hematuria. Musculoskeletal: No none or joint pain.  No muscle tenderness. Extremities:  No pain or swelling. Skin:  No rashes or irritation. Neuro:  No headache, numbness or weakness, balance or coordination issues.  Endocrine:  No diabetes, thyroid issues, hot flashes or night sweats. Psych:  No mood changes, depression or anxiety. Pain:  No focal pain. Review of systems:  All other systems reviewed and found to be negative.  Physical Exam: Blood pressure 111/76, pulse 93, temperature 97.9 F (36.6 C), temperature source Tympanic, resp. rate 18, weight 160 lb 0.9 oz (72.6 kg), last menstrual period 03/19/2015. GENERAL:  Well developed, well nourished, woman sitting comfortably in the exam room in no acute distress.  MENTAL STATUS:  Alert and oriented to person, place and time. HEAD: Wearing a brown cap.  Normocephalic, atraumatic, face symmetric, no Cushingoid features. EYES:  Brown eyes. Pupils equal round and reactive to light and accomodation.  No conjunctivitis or scleral icterus. ENT:  Oropharynx clear without lesion.  Tongue normal. Mucous membranes moist.  RESPIRATORY:  Clear to auscultation without rales, wheezes or rhonchi. CARDIOVASCULAR:  Regular rate and rhythm without murmur, rub or gallop. ABDOMEN:  Soft, non-tender, with active bowel sounds, and no hepatosplenomegaly.  No masses. SKIN:  No rashes, bruises or ulcers. EXTREMITIES:  No edema, no skin discoloration or tenderness.  No palpable cords. LYMPH NODES:  No palpable cervical, supraclavicular, axillary or inguinal adenopathy  NEUROLOGICAL:  Appropriate. PSYCH:  Appropriate.      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 No lymph invasion. Tumor cellularity 20%. [id number]. 07/13/2008: Left mastectomy: Residual ductal carcinoma in situ, high nuclear grade, deep margin negative ypTisNO(sn)MX. 10/25/2010: Abdominal and pelvic ultrasound: questionable lesion near gallbladder fossa recommend MRI follow up. 03/02/2011: Left and right frontal excisional brain biopsy: Adenocarcinoma, metastatic, NOS: ER +, PR 5% +, Her 2 NEG. 03/04/2011: [facility] testing of IHC4 residual recurrence risk score of 114 showing an 8 year recurrence reate of 57%. ER POSTIVE, PR POSITIVE, HER 2 POSTIVE (previously documented negative in 2009) cutoff of 361 patient scores 426. ki67 71%.  08/05/2013: Right breast FNA supraclavicular region: positive for metastatic carcinoma. ER/PR/HER2 not reported. 08/19/2014: Right cervical lymph node. Adenocarcinoma with features consistent with metastatic  breast carcinoma. ER/PR/HER2 insufficient tissue. 09/04/2014: Repeat craniotomy, resection of right frontal tumor (metastatic breast cancer). ER 90-100% PR 51-60% HER2 - 09/29/2014: Right cervical lymph node, metastatic carcinoma c/w breast primary, ER 100% PR 20% HER2-  Imaging: 08/15/2007: Bilateral Mammogram: Dense breasts with solid lesion at 6:00, 7:00, and 9:00 of right breast, solid lesion at 2:00 position left breast BIRADS 4B. 09/10/2007: Breast MRI: Three solid irregular enhancing lesions of the right breast 2.1 x 3.2, 2.8 x 2.7, and 1.8 x 1.6 cm. Mildly enlarged enhancing nodule right axilla. Left breast clumped enhancement midportion suspicious for  malignancy. 03/01/2011: Brain MRI: At least two juxtacortical, intra-axial masses with extensive surrounding vasogenic edema most consistent with intracranial metastasis. 07/22/2013: Brain MRI +/- contrast: interval increase in enhancing component on T2 FLAIR now measuring 1.0cm x 1.1 cm worrisome for progression of disease. 01/21/2014:  PET scan: Hypermetabolic small right supraclavicular and right upper paratracheal lymph nodes suspicious for mets. Hypermetabolic lymph node in the right paratracheal upper mediastinum that measure approximately 0.9 x 0.5cm. (of note no impressions made of areas noted on brain MRI 07/22/13).  01/21/2014: Head MRI: Right frontal enhancing lesion has shown interval increase in size with surrounding edema and local mass effect (measured 1.8x2.1x1.6, previously 1.2x1.1x1.0) 07/16/2014: Head MRI:  Right anterior frontal enhancing mass 2.5 x 2.0 cm compatible with a metastatic lesion has increased and changed in configuration from previous 2.0 x 1.8 cm, formerly multilobular. 08/06/2014: PET scan:  Multiple bilateral FDG avid low cervical, supraclavicular, upper mediastinal, and right internal mammary lymphadenopathy, likely representing metastatic disease. A cervical node in the right lower neck should be amenable to percutaneous sampling.  12/2014: Head MRI:  Evolution of right frontal postoperative changes status post tumor resection at the vertex. Expected postoperative changes. Minimal marginal enhancement resection bed with some adjacent posterior intrinsic T1 signal again seen. Decreasing T2/FLAIR adjacent parenchymal changes.  Stable resection cavity is left posterior frontal and right parieto-occipital regions. 04/20/2015 : PET scan: Cervical and thoracic nodal metastasis.  There was multifocal osseous metastasis (left transverse T4 process, left humeral head, right iliac wing, and left side of the sacrum).  Incidental findings included right nephrolithiasis.   04/22/2015:  Bone scan: Corresponded with PET-CT with metastatic lesions evident in the left humeral head, left posterior aspect of T4, left aspect of S1, and the right iliac crest.  There was no other definite foci of metastatic disease to bone.  Uptake in the skull was most suggestive of the previous right frontal craniotomy. 05/24/2015: Bone density:  T-score of -0.8 in the right femoral neck (normal). 07/07/2015:  Head MRI:  Metastatic breast cancer with 3 resection cavities. The anterior right frontal cavity was positive for nodular marginal enhancement, new from brain MRI report 01/07/2015, and consistent with recurrent disease.  08/12/2015:  Lumbar Spine MRI: Osseous metastatic disease at L3, L4, L5, S1, and in the left iliac bone posteriorly. There was no definite epidural metastatic disease. 08/20/2015:  PET scan:  Response to therapy in the lower cervical and thoracic adenopathy.  There was a mixed response to multifocal osseous metastases (some new lesions).  12/06/2015:  Head MRI:  Decreased nodular contrast enhancement at the right frontal lobe treatment site.  There was unchanged appearance of treatment sites within the bilateral parietal lobe size without residual or recurrent disease.  There were no new metastatic lesions. 02/18/2016: PET scan: Mixed response to therapy. There hadbeen improvement in right cervical lymphadenopathy and in several osseous lesions. There wereseveral other osseous lesions in  the pelvis with increasing hypermetabolism compared to the prior study. As the majority of the osseous lesions wereincreasingly sclerotic, some of this hypermetabolism could simply reflect bony healing. There was no new extraskeletal metastatic disease is noted in the neck, chest, abdomen or pelvis. 03/06/2016: Head MRI: Unchanged small focus of nodular enhancement at the right frontal resection site. There was unchanged appearance of the 2 other resection sites without abnormal enhancement. There  was no evidence of new intracranial metastases.   Labs:  Appointment on 05/30/2016  Component Date Value Ref Range Status  . WBC 05/30/2016 2.5* 3.6 - 11.0 K/uL Final  . RBC 05/30/2016 3.10* 3.80 - 5.20 MIL/uL Final  . Hemoglobin 05/30/2016 10.5* 12.0 - 16.0 g/dL Final  . HCT 05/30/2016 30.4* 35.0 - 47.0 % Final  . MCV 05/30/2016 98.1  80.0 - 100.0 fL Final  . MCH 05/30/2016 33.9  26.0 - 34.0 pg Final  . MCHC 05/30/2016 34.5  32.0 - 36.0 g/dL Final  . RDW 05/30/2016 20.8* 11.5 - 14.5 % Final  . Platelets 05/30/2016 235  150 - 440 K/uL Final  . Neutrophils Relative % 05/30/2016 57  % Final  . Neutro Abs 05/30/2016 1.4  1.4 - 6.5 K/uL Final  . Lymphocytes Relative 05/30/2016 31  % Final  . Lymphs Abs 05/30/2016 0.8* 1.0 - 3.6 K/uL Final  . Monocytes Relative 05/30/2016 9  % Final  . Monocytes Absolute 05/30/2016 0.2  0.2 - 0.9 K/uL Final  . Eosinophils Relative 05/30/2016 1  % Final  . Eosinophils Absolute 05/30/2016 0.0  0 - 0.7 K/uL Final  . Basophils Relative 05/30/2016 2  % Final  . Basophils Absolute 05/30/2016 0.1  0 - 0.1 K/uL Final  . Sodium 05/30/2016 138  135 - 145 mmol/L Final  . Potassium 05/30/2016 4.0  3.5 - 5.1 mmol/L Final  . Chloride 05/30/2016 104  101 - 111 mmol/L Final  . CO2 05/30/2016 26  22 - 32 mmol/L Final  . Glucose, Bld 05/30/2016 111* 65 - 99 mg/dL Final  . BUN 05/30/2016 16  6 - 20 mg/dL Final  . Creatinine, Ser 05/30/2016 0.64  0.44 - 1.00 mg/dL Final  . Calcium 05/30/2016 9.0  8.9 - 10.3 mg/dL Final  . Total Protein 05/30/2016 7.6  6.5 - 8.1 g/dL Final  . Albumin 05/30/2016 4.0  3.5 - 5.0 g/dL Final  . AST 05/30/2016 25  15 - 41 U/L Final  . ALT 05/30/2016 12* 14 - 54 U/L Final  . Alkaline Phosphatase 05/30/2016 60  38 - 126 U/L Final  . Total Bilirubin 05/30/2016 0.5  0.3 - 1.2 mg/dL Final  . GFR calc non Af Amer 05/30/2016 >60  >60 mL/min Final  . GFR calc Af Amer 05/30/2016 >60  >60 mL/min Final   Comment: (NOTE) The eGFR has been calculated  using the CKD EPI equation. This calculation has not been validated in all clinical situations. eGFR's persistently <60 mL/min signify possible Chronic Kidney Disease.   . Anion gap 05/30/2016 8  5 - 15 Final    Assessment:  Melinda Tanner is a 46 y.o. 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  She initially presented in 2009 while living in [place] with multi-focal right breast cancer with positive lymph node(s) which was ER+, PR+(low), and HER2/neu+. She received neoadjuvant chemotherapy (AC x 4 every 3 weeks followed by Taxol/Abraxane + carboplatin weekly x 12) without anti-HER2 treatment.    On 07/14/2008, she underwent bilateral  mastectomies followed by reconstruction.  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   Zero of 11 lymph nodes on the right were positive for malignancy. 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 Deep margin was negative. Zero of 2 lymph nodes were positive for metastatic disease.  She received adjuvant radiation to the right breast.  She received adjuvant tamoxifen and Zoladex.  She could not tolerate symptoms of joint pain and tamoxifen was discontinued after 1-2 months.  Zoladex was continued for approximately 1.5 years.  She was diagnosed with brain metastases in 02/2011.  Pathology revealed ER+, PR+(low), and HER2/neu-.  She underwent resection followed by [facility].  On 03/09/2011, original breast tissue (09/03/2007) sent to [facility] testing.  Testing revealed ER+, PR+(borderline), and HER2/neu positive (different than initial testing).  She received Herceptin for 1 year beginning in 2013.  She then began [place]kerb and Xeloda after completion of Herceptin (2014).  She was on extremely low, and probably sub-therapeutic, dosing of lapatinib + capecitabine.   She developed right supraclavicular adenopathy in 2015. She was noted to have slow disease progression in the right  frontal/parietal lesion with minimal presence of systemic disease on PET scans. Capecitabine + lapatinib were discontinued in 04/2014.   She underwent repeat craniotomy with resection of brain metastasis on 09/04/2014.  Biopsy of the right supraclavicular lymph node  was ER+,PR+,HER2/neu-.  She started tamoxifen in 09/2014, but discontinued it secondary to pain in hip, back, and shoulder.  She restarted tamoxifen on 04/23/2015.  Tamoxifen was discontinued on 08/23/2015 secondary to progressive disease.  She received Zoladex on 04/30/2015.  She underwent laparoscopic bilateral oophorectomy on 05/18/2015.  She receives monthly Xgeva (began 04/30/2015; last 05/02/2016).  She is post-menopausal.  She began Faslodex on 08/23/2015 (last 04/04/2016).  She is s/p 6 cycles of Ibrance (09/20/2015 - 05/03/2016).  Patient received palliative radiation 30 Gy to the left humerus and lumbar spine from 08/25/2015 - 09/09/2015  CA27.29 has been followed: 177.8 on 04/16/2015, 276.4 on 05/28/2015, 287.5 on 07/13/2015, 333.8 on 07/26/2015, 412.9 on 08/23/2015, 315.6 on 09/20/2015, 188.8 on 10/18/2015, 151.5 on 11/15/2015, 122.7 on 12/10/2015, 94.6 on 01/10/2016, 80.9 on 02/07/2016, 79 on 03/06/2016, 84.8 on 04/04/2016, 85.2 on 05/02/2016, and 91.1 on 05/30/2016.  Bone density study on 05/24/2015 revealed a T-score of -0.8 in the right femoral neck (normal).  She has a normocytic anemia due to treatment Melinda Tanner, radiation) and B12 deficiency.  Work-up on 10/25/2015 revealed a B12 of 141 (low).  B12 was 205 on 12/13/2015 with an MMA of 171 (normal) on 01/10/2016.  Ferritin was 43. Iron studies included a saturation of 13% and a TIBC of 354. TSH and folate were normal.  Reticulocyte count was 2.2%.  She declined B12 injections.  She started oral B12 1000 mcg on 11/05/2015.  She takes her B12 sporadically.  B12 level was 205 (low normal) on 12/13/2015.  Diet is good.  She denies any melena or  hematochezia.  Symptomatically, feels good.  She denies any pain.  She has a stable mild normocytic anemia.  ANC is 1400. She receives Faslodex and Xegva monthly.  Calcium is 9.0.  Plan: 1.  Labs today:  CBC with diff, CMP, B12, CA27.29. 2.  Discuss  Interval bone scan.  The majority of lesions have improved.  Discuss potential new lesions (right lower rib,T12 and L1 levels of spine).  Discuss plan to review films with radiology.  Discuss plans for future PET scan to fully assess  extent of disease in next 1-2 months.  Discuss continuation of head MRI every 3 months. 3.  Anticipate next cycle of Ibrance beginning in 1 week. 4.  Xgeva today. 5.  Faslodex today. 6.  Schedule head MRI. 7.  Rx:  Protonix. 8.  RTC in 1 week for labs (CBC with diff). 9.  RTC in 1 month for MD assessment, labs (CBC with diff, CMP, CA27.29), and review of head MRI.   Lequita Asal, MD  05/30/2016, 10:37 AM

## 2016-05-31 LAB — CANCER ANTIGEN 27.29: CA 27.29: 91.1 U/mL — ABNORMAL HIGH (ref 0.0–38.6)

## 2016-06-06 ENCOUNTER — Telehealth: Payer: Self-pay | Admitting: *Deleted

## 2016-06-06 ENCOUNTER — Inpatient Hospital Stay: Payer: Medicare HMO

## 2016-06-06 DIAGNOSIS — C50911 Malignant neoplasm of unspecified site of right female breast: Secondary | ICD-10-CM | POA: Diagnosis not present

## 2016-06-06 DIAGNOSIS — C7931 Secondary malignant neoplasm of brain: Secondary | ICD-10-CM

## 2016-06-06 DIAGNOSIS — C7951 Secondary malignant neoplasm of bone: Secondary | ICD-10-CM

## 2016-06-06 LAB — CBC WITH DIFFERENTIAL/PLATELET
Basophils Absolute: 0.1 10*3/uL (ref 0–0.1)
Basophils Relative: 3 %
Eosinophils Absolute: 0 10*3/uL (ref 0–0.7)
Eosinophils Relative: 1 %
HCT: 31.3 % — ABNORMAL LOW (ref 35.0–47.0)
Hemoglobin: 10.8 g/dL — ABNORMAL LOW (ref 12.0–16.0)
Lymphocytes Relative: 23 %
Lymphs Abs: 0.9 10*3/uL — ABNORMAL LOW (ref 1.0–3.6)
MCH: 33.6 pg (ref 26.0–34.0)
MCHC: 34.4 g/dL (ref 32.0–36.0)
MCV: 97.6 fL (ref 80.0–100.0)
Monocytes Absolute: 0.5 10*3/uL (ref 0.2–0.9)
Monocytes Relative: 12 %
Neutro Abs: 2.5 10*3/uL (ref 1.4–6.5)
Neutrophils Relative %: 61 %
Platelets: 310 10*3/uL (ref 150–440)
RBC: 3.2 MIL/uL — ABNORMAL LOW (ref 3.80–5.20)
RDW: 21 % — ABNORMAL HIGH (ref 11.5–14.5)
WBC: 4.1 10*3/uL (ref 3.6–11.0)

## 2016-06-06 NOTE — Telephone Encounter (Signed)
Called patient to let her know her counts are good.

## 2016-06-06 NOTE — Telephone Encounter (Signed)
-----   Message from Lequita Asal, MD sent at 06/06/2016  3:58 PM EDT ----- Regarding: Please call patient  Counts great.  M  ----- Message ----- From: Interface, Lab In [place] Sent: 06/06/2016   9:48 AM To: Lequita Asal, MD

## 2016-06-07 ENCOUNTER — Telehealth: Payer: Self-pay | Admitting: *Deleted

## 2016-06-07 ENCOUNTER — Encounter: Payer: Self-pay | Admitting: Emergency Medicine

## 2016-06-07 ENCOUNTER — Emergency Department
Admission: EM | Admit: 2016-06-07 | Discharge: 2016-06-08 | Disposition: A | Discharge status: Home / Self Care | Payer: Medicare HMO | Attending: Emergency Medicine | Admitting: Emergency Medicine

## 2016-06-07 ENCOUNTER — Emergency Department: Payer: Medicare HMO

## 2016-06-07 DIAGNOSIS — Z853 Personal history of malignant neoplasm of breast: Secondary | ICD-10-CM | POA: Insufficient documentation

## 2016-06-07 DIAGNOSIS — M5431 Sciatica, right side: Secondary | ICD-10-CM

## 2016-06-07 DIAGNOSIS — C50919 Malignant neoplasm of unspecified site of unspecified female breast: Secondary | ICD-10-CM | POA: Diagnosis not present

## 2016-06-07 DIAGNOSIS — M5442 Lumbago with sciatica, left side: Secondary | ICD-10-CM | POA: Diagnosis not present

## 2016-06-07 DIAGNOSIS — M79604 Pain in right leg: Secondary | ICD-10-CM

## 2016-06-07 LAB — COMPREHENSIVE METABOLIC PANEL
ALBUMIN: 4.5 g/dL (ref 3.5–5.0)
ALT: 14 U/L (ref 14–54)
AST: 29 U/L (ref 15–41)
Alkaline Phosphatase: 75 U/L (ref 38–126)
Anion gap: 9 (ref 5–15)
BUN: 13 mg/dL (ref 6–20)
CHLORIDE: 103 mmol/L (ref 101–111)
CO2: 28 mmol/L (ref 22–32)
CREATININE: 0.59 mg/dL (ref 0.44–1.00)
Calcium: 10 mg/dL (ref 8.9–10.3)
GFR calc Af Amer: >60 mL/min (ref >60)
GLUCOSE: 158 mg/dL — AB (ref 65–99)
Potassium: 4.7 mmol/L (ref 3.5–5.1)
SODIUM: 140 mmol/L (ref 135–145)
Total Bilirubin: 0.5 mg/dL (ref 0.3–1.2)
Total Protein: 8.7 g/dL — ABNORMAL HIGH (ref 6.5–8.1)

## 2016-06-07 LAB — CBC WITH DIFFERENTIAL/PLATELET
BASOS ABS: 0.1 10*3/uL (ref 0–0.1)
BASOS PCT: 1 %
EOS PCT: 0 %
Eosinophils Absolute: 0 10*3/uL (ref 0–0.7)
HCT: 35.7 % (ref 35.0–47.0)
Hemoglobin: 12 g/dL (ref 12.0–16.0)
LYMPHS PCT: 5 %
Lymphs Abs: 0.4 10*3/uL — ABNORMAL LOW (ref 1.0–3.6)
MCH: 33 pg (ref 26.0–34.0)
MCHC: 33.8 g/dL (ref 32.0–36.0)
MCV: 97.8 fL (ref 80.0–100.0)
Monocytes Absolute: 0.1 10*3/uL — ABNORMAL LOW (ref 0.2–0.9)
Monocytes Relative: 1 %
NEUTROS ABS: 7.5 10*3/uL — AB (ref 1.4–6.5)
Neutrophils Relative %: 93 %
Platelets: 380 10*3/uL (ref 150–440)
RBC: 3.65 MIL/uL — AB (ref 3.80–5.20)
RDW: 20.4 % — ABNORMAL HIGH (ref 11.5–14.5)
WBC: 8.1 10*3/uL (ref 3.6–11.0)

## 2016-06-07 MED ORDER — SODIUM CHLORIDE 0.9 % IV BOLUS (SEPSIS)
1000.0000 mL | Freq: Once | INTRAVENOUS | Status: AC
  Administered 2016-06-07: 1000 mL via INTRAVENOUS

## 2016-06-07 MED ORDER — DOCUSATE SODIUM 100 MG PO CAPS: ORAL_CAPSULE | ORAL | 0 refills | Status: AC

## 2016-06-07 MED ORDER — HYDROCODONE-ACETAMINOPHEN 5-325 MG PO TABS: 1.0000 | ORAL_TABLET | ORAL | 0 refills | Status: DC | PRN

## 2016-06-07 MED ORDER — IBUPROFEN 800 MG PO TABS
800.0000 mg | ORAL_TABLET | Freq: Once | ORAL | Status: AC
  Administered 2016-06-07: 800 mg via ORAL
  Filled 2016-06-07: qty 1

## 2016-06-07 MED ORDER — GADOBENATE DIMEGLUMINE 529 MG/ML IV SOLN
15.0000 mL | Freq: Once | INTRAVENOUS | Status: AC | PRN
  Administered 2016-06-07: 15 mL via INTRAVENOUS

## 2016-06-07 NOTE — ED Triage Notes (Signed)
Spanish [employer] present for triage. Pt presents to ED by her doctor because they found a tumor on her back from a bone scan in which she c/o of pain in her legs, loss of sensation, and difficulty walking on her left leg. Hx of cancer

## 2016-06-07 NOTE — ED Provider Notes (Signed)
[name]  Result Date: 06/07/2016 CLINICAL DATA:  Metastatic breast cancer EXAM: MRI THORACIC AND LUMBAR SPINE WITHOUT AND WITH CONTRAST TECHNIQUE: Multiplanar and multiecho pulse sequences of the thoracic and lumbar spine were obtained without and with intravenous contrast. CONTRAST:  15m MULTIHANCE GADOBENATE DIMEGLUMINE 529 MG/ML IV SOLN COMPARISON:  05/23/2016 bone scan 02/18/2016 PET CT 08/12/2015 lumbar spine MRI FINDINGS: MRI THORACIC SPINE FINDINGS Alignment:  Normal Vertebrae: There are multiple focal marrow abnormalities throughout the thoracic spine, all of which are unchanged compared to the PET CT of 02/18/2016. 1. T1: Low T1/T2 weighted signal of the posterosuperior vertebral body without contrast enhancement. 2. T4: Low T1/T2 weighted signal of the posterior left aspect the vertebral body extending into the left pedicle and spinous process without contrast enhancement. 3. T5: Small low T1/T2 weighted signal lesion without contrast enhancement, in the posterior vertebral body. 4. T6: Large low T1/T2 weighted signal lesion of the right aspect of the vertebral body and right pedicle without contrast enhancement. 5. T10: Small low T1/T2 weighted signal lesion at the inferior endplate without associated contrast enhancement. 6. T12: 2 separate lesions, 1 of the superior endplate and 1 at the left body/ pedicle junction. Low T1 and T2 weighted signal in both lesions without contrast enhancement. Cord:  Normal caliber and signal. Paraspinal and other soft tissues: Unremarkable. Disc levels: No spinal canal or neural foraminal stenosis. MRI LUMBAR SPINE FINDINGS Segmentation:  Standard Alignment:  Normal Vertebrae: There are multiple focal marrow abnormalities within the lumbar spine. 1. L1: Low signal lesion at the right junction of the pedicle and body with minimal peripheral contrast enhancement. Unchanged compared to 02/18/2016. 2. L3: Nonenhancing low signal lesion of the left  superior endplate. Unchanged compared to 02/18/2016, but new compared to 08/12/2015. 3. L5: Mildly contrast-enhancing low T1/T2 weighted signal lesion replacing most of the vertebral body and extending into the posterior neural arch. Unchanged size compared to 02/18/2016. The degree of contrast enhancement has decreased compared to the MRI of 08/12/2015 4. Contrast-enhancing lesion of the right sacral ala shows decreased, but persistent, contrast-enhancement compared to lumbar spine MRI of 08/12/2015. Conus medullaris: Extends to the L1 level and appears normal. Paraspinal and other soft tissues: Unremarkable Disc levels: No spinal canal or neural foraminal stenosis. IMPRESSION: 1. Multiple low T1/T2 weighted signal, non enhancing lesions within the thoracic spine, which correspond in size and location to sclerotic lesions demonstrated on the PET CT of 02/18/2016. No new thoracic lesions. 2. Minimal residual contrast enhancement within the L5 vertebral body metastatic lesion, decreased compared to the MRI of 08/12/2015. 3. Minimal contrast enhancement at the periphery of the lesion within the L1 vertebral body. This lesion is new compared to MRI of 08/12/2015, but unchanged in size and location compared to PET CT of 02/18/2016. 4. No epidural disease, spinal canal stenosis or neural foraminal encroachment. Electronically Signed   By: KUlyses JarredM.D.   On: 06/07/2016 23:21   [name]  Result Date: 06/07/2016 CLINICAL DATA:  Metastatic breast cancer EXAM: MRI THORACIC AND LUMBAR SPINE WITHOUT AND WITH CONTRAST TECHNIQUE: Multiplanar and multiecho pulse sequences of the thoracic and lumbar spine were obtained without and with intravenous contrast. CONTRAST:  184mMULTIHANCE GADOBENATE DIMEGLUMINE 529 MG/ML IV SOLN COMPARISON:  05/23/2016 bone scan 02/18/2016 PET CT 08/12/2015 lumbar spine MRI FINDINGS: MRI THORACIC SPINE FINDINGS Alignment:  Normal Vertebrae: There are multiple focal marrow  abnormalities throughout the thoracic spine, all of which are unchanged  compared to the PET CT of 02/18/2016. 1. T1: Low T1/T2 weighted signal of the posterosuperior vertebral body without contrast enhancement. 2. T4: Low T1/T2 weighted signal of the posterior left aspect the vertebral body extending into the left pedicle and spinous process without contrast enhancement. 3. T5: Small low T1/T2 weighted signal lesion without contrast enhancement, in the posterior vertebral body. 4. T6: Large low T1/T2 weighted signal lesion of the right aspect of the vertebral body and right pedicle without contrast enhancement. 5. T10: Small low T1/T2 weighted signal lesion at the inferior endplate without associated contrast enhancement. 6. T12: 2 separate lesions, 1 of the superior endplate and 1 at the left body/ pedicle junction. Low T1 and T2 weighted signal in both lesions without contrast enhancement. Cord:  Normal caliber and signal. Paraspinal and other soft tissues: Unremarkable. Disc levels: No spinal canal or neural foraminal stenosis. MRI LUMBAR SPINE FINDINGS Segmentation:  Standard Alignment:  Normal Vertebrae: There are multiple focal marrow abnormalities within the lumbar spine. 1. L1: Low signal lesion at the right junction of the pedicle and body with minimal peripheral contrast enhancement. Unchanged compared to 02/18/2016. 2. L3: Nonenhancing low signal lesion of the left superior endplate. Unchanged compared to 02/18/2016, but new compared to 08/12/2015. 3. L5: Mildly contrast-enhancing low T1/T2 weighted signal lesion replacing most of the vertebral body and extending into the posterior neural arch. Unchanged size compared to 02/18/2016. The degree of contrast enhancement has decreased compared to the MRI of 08/12/2015 4. Contrast-enhancing lesion of the right sacral ala shows decreased, but persistent, contrast-enhancement compared to lumbar spine MRI of 08/12/2015. Conus medullaris: Extends to the L1 level  and appears normal. Paraspinal and other soft tissues: Unremarkable Disc levels: No spinal canal or neural foraminal stenosis. IMPRESSION: 1. Multiple low T1/T2 weighted signal, non enhancing lesions within the thoracic spine, which correspond in size and location to sclerotic lesions demonstrated on the PET CT of 02/18/2016. No new thoracic lesions. 2. Minimal residual contrast enhancement within the L5 vertebral body metastatic lesion, decreased compared to the MRI of 08/12/2015. 3. Minimal contrast enhancement at the periphery of the lesion within the L1 vertebral body. This lesion is new compared to MRI of 08/12/2015, but unchanged in size and location compared to PET CT of 02/18/2016. 4. No epidural disease, spinal canal stenosis or neural foraminal encroachment. Electronically Signed   By: Ulyses Jarred M.D.   On: 06/07/2016 23:21    Clinical Course as of Jun 09 30  Wed Jun 07, 2016  2302 Assuming care from Dr. Mariea Clonts.  In short, Melinda Tanner is a 46 y.o. female with a chief complaint of back pain.  Refer to the original H&P for additional details.  The current plan of care is to follow up MRIs and UA, and call Dr. Mike Gip with the results.   [CF]  2355 The results of the MRI confirm the lesions seen on PET scan, but show no evidence of nerve/spine impingement.  I updated the patient and her husband.  I also called and spoke by phone with Dr. Mike Gip.  We agreed upon when necessary pain control and Dr. Mike Gip will contact radiation oncology tomorrow to follow-up.  The patient and her husband understand and agree with the plan.  Her pain is minimal at this point and she does not need any additional medication at this time.  Dr. Mike Gip and I agreed to forego steroids at this point since the patient already took some Decadron and there is no  active nerve impingement currently.  [CF]  Thu Jun 08, 2016  0000 I reviewed the patient's prescription history over the last 12 months in the  multi-state controlled substances database(s) that includes [place], [place], [place], [place], [place], [place], [place], [place], [place], [place], [place], [place], [place], and [place].  The patient has filled no controlled substances during that time.   [CF]  0031 Normal UA.  Discharging as per prior plan.  [CF]    Clinical Course User Index [CF] Hinda Kehr, MD      Hinda Kehr, MD 06/08/16 [phone]

## 2016-06-07 NOTE — ED Notes (Signed)
Spoke with Dr. Joni Fears, pt to receive basic labs; imaging will be decided once placed in a room by provider

## 2016-06-07 NOTE — ED Notes (Signed)
Patient transported to MRI with interpreter and NT

## 2016-06-07 NOTE — ED Provider Notes (Signed)
[facility] Emergency Department Provider Note  ____________________________________________  Time seen: Approximately 7:07 PM  I have reviewed the triage vital signs and the nursing notes.   HISTORY  Chief Complaint Numbness and Leg Pain    HPI Melinda Tanner is a 46 y.o. female a history of metastatic breast cancer, brain cancer status post resection, and multiple foci of likely metastatic disease presenting with left-sided back pain. The patient reports that last night she developed a sharp left-sided pain that would shoot into the buttock and the posterior thigh and was associated with some numbness. She also had some intermittent cramping in the right toes and anterior aspect of the foot. She took 2 mg of Decadron, which significantly improved her pain. She denies any urinary or fecal retention or incontinence, saddle anesthesia, or difficulty walking. Denies any fevers. He spoke with her primary oncologist, Dr. Mike Gip, who recommended presentation to the emergency department.    Most Recent Bone Scan Shows: Stable focus of abnormal uptake seen in right frontal skull consistent with prior craniotomy.  Areas of abnormal uptake identified in the left humeral head, T4 and S1 levels an right iliac crest are present but decreased in intensity compared to prior exam suggesting possible improvement. However, new foci of abnormal uptake are noted in a right lower rib, T12 and L1 levels of spine concerning for metastatic disease.   Past Medical History:  Diagnosis Date  . Brain cancer ([place]) 2012   Met. from Breast  . Breast cancer ([place]) 2009  . Complication of anesthesia    nausea, "drops in potassium and magnesium"  . Seizures [facility])     Patient Active Problem List   Diagnosis Date Noted  . B12 deficiency 10/25/2015  . Anemia 10/18/2015  . Reflux 07/01/2015  . 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 Bone metastasis ([place]) 04/22/2015  .  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 Blepharitis 02/03/2015  . 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 Presbyopia 02/03/2015  . Encounter for prophylactic removal of breast 12/18/2014  . H/O dysmenorrhea 10/27/2014  . Brain lesion 08/25/2014  . Primary malignant neoplasm of breast ([place]) 06/25/2014  . Brain metastases ([place]) 03/01/2011    Past Surgical History:  Procedure Laterality Date  . BRAIN SURGERY  2012, 2106  . CESAREAN SECTION    . LAPAROSCOPIC BILATERAL SALPINGO OOPHERECTOMY Bilateral 05/18/2015   Procedure: LAPAROSCOPIC BILATERAL SALPINGO OOPHORECTOMY;  Surgeon: Will Bonnet, MD;  Location: [facility] ORS;  Service: Gynecology;  Laterality: Bilateral;  . MASTECTOMY Bilateral 2010    Current Outpatient Rx  . Order #: [id number] Class: Historical Med  . Order #: [id number] Class: Historical Med  . Order #: [id number] Class: Print  . Order #: [id number] Class: Print  . Order #: [id number] Class: Normal  . Order #: [id number] Class: Normal    Allergies Taxol [paclitaxel]; Aspirin; Penicillins; and Zofran [ondansetron hcl]  Family History  Problem Relation Age of Onset  . Cancer Paternal Aunt   . Cancer Paternal Uncle   . Cancer Paternal Grandfather   . Hypertension Brother   . Diabetes Paternal Grandmother     Social History Social History  Substance Use Topics  . Smoking status: Never Smoker  . Smokeless tobacco: Never Used  . Alcohol use No    Review of Systems Constitutional: No fever/chills.No trauma. No falls. Eyes: No visual changes. ENT: No sore throat. No congestion or rhinorrhea. Cardiovascular: Denies chest pain. Denies palpitations. Respiratory: Denies shortness of breath.  No cough. Gastrointestinal: No abdominal  pain.  No nausea, no vomiting.  No diarrhea.  No constipation. Genitourinary: Negative for dysuria. Urinary or fecal incontinence or retention. Musculoskeletal: Positive for left buttock pain radiating to the posterior left  thigh. Positive for cramping in the right foot. No difficulty walking. Skin: Negative for rash. Neurological: Negative for headaches. No tingling or weakness.   10-point ROS otherwise negative.  ____________________________________________   PHYSICAL EXAM:  VITAL SIGNS: ED Triage Vitals  Enc Vitals Group     BP 06/07/16 1659 (!) 140/99     Pulse Rate 06/07/16 1659 100     Resp 06/07/16 1659 19     Temp 06/07/16 1659 99 F (37.2 C)     Temp Source 06/07/16 1659 Oral     SpO2 06/07/16 1659 100 %     Weight 06/07/16 1700 160 lb 9 oz (72.8 kg)     Height 06/07/16 1700 5' 3"  (1.6 m)     Head Circumference --      Peak Flow --      Pain Score --      Pain Loc --      Pain Edu? --      Excl. in [place]? --     Constitutional: Alert and oriented. Well appearing and in no acute distress. Answers questions appropriately. Eyes: Conjunctivae are normal.  EOMI. No scleral icterus. Head: Atraumatic. Nose: No congestion/rhinnorhea. Mouth/Throat: Mucous membranes are moist.  Neck: No stridor.  Supple.  Lungs C-spine tenderness to palpation, step-offs or deformities. Cardiovascular: Normal rate, regular rhythm. No murmurs, rubs or gallops.  Respiratory: Normal respiratory effort.  No accessory muscle use or retractions. Lungs CTAB.  No wheezes, rales or ronchi. Gastrointestinal: Soft, nontender and nondistended.  No guarding or rebound.  No peritoneal signs. Musculoskeletal: Midline thoracic or upper mid lumbar numbness to palpation, step-offs or deformities. Mild pain at the lumbosacral in the midline.  Neurologic:  A&Ox3.  Speech is clear.  Face and smile are symmetric.  EOMI. 5 out of 5 hip flexors, quads and hamstring, dorsiflexion and plantarflexion motor strength. Normal sensation to light touch in the bilateral lower extremities. Normal patellar reflexes bilaterally. Normal gait without ataxia. Skin:  Skin is warm, dry and intact. No rash noted. Psychiatric: Mood and affect are normal.  Speech and behavior are normal.  Normal judgement.  ____________________________________________   LABS (all labs ordered are listed, but only abnormal results are displayed)  Labs Reviewed  CBC WITH DIFFERENTIAL/PLATELET - Abnormal; Notable for the following:       Result Value   RBC 3.65 (*)    RDW 20.4 (*)    Neutro Abs 7.5 (*)    Lymphs Abs 0.4 (*)    Monocytes Absolute 0.1 (*)    All other components within normal limits  COMPREHENSIVE METABOLIC PANEL - Abnormal; Notable for the following:    Glucose, Bld 158 (*)    Total Protein 8.7 (*)    All other components within normal limits  URINALYSIS, COMPLETE (UACMP) WITH MICROSCOPIC   ____________________________________________  EKG  Not indicated ____________________________________________  RADIOLOGY  No results found.  ____________________________________________   PROCEDURES  Procedure(s) performed: None  Procedures  Critical Care performed: No ____________________________________________   INITIAL IMPRESSION / ASSESSMENT AND PLAN / ED COURSE  Pertinent labs & imaging results that were available during my care of the patient were reviewed by me and considered in my medical decision making (see chart for details).  46 y.o. female with a history of metastatic breast cancer, recent known  foci at T12 and L1, presenting with pain in the left buttock and posterior thigh. While the patient has not had findings that would be consistent with significant spinal cord compression, her metastatic disease is concerning will get an MRI of the thoracic and lumbar spine. In the meantime, I'll treat the patient with Motrin to see if this helps alleviate her pain. We also check a urinalysis. I discussed the plan with Dr. Mike Gip, the patient's primary College's, and will make a final disposition depending on the results of the MRI and the patient's clinical course.  ----------------------------------------- 11:23 PM on  06/07/2016 -----------------------------------------  The patient has continued to be hemodynamically stable with no new symptoms. Her MRI is being performed and she has been signed out to Dr. Hinda Kehr who will make final disposition plan.  ____________________________________________  FINAL CLINICAL IMPRESSION(S) / ED DIAGNOSES  Final diagnoses:  Right leg pain  Right leg pain    Clinical Course as of Jun 07 2321  Wed Jun 07, 2016  2302 Assuming care from Dr. Mariea Clonts.  In short, Melinda Tanner is a 46 y.o. female with a chief complaint of back pain.  Refer to the original H&P for additional details.  The current plan of care is to follow up MRIs and UA, and call Dr. Mike Gip with the results.   [CF]    Clinical Course User Index [CF] Hinda Kehr, MD      NEW MEDICATIONS STARTED DURING THIS VISIT:  New Prescriptions   No medications on file      Eula Listen, MD 06/07/16 2323

## 2016-06-07 NOTE — Telephone Encounter (Signed)
Patient needs to go to the ED NOW and have possible studies of her spine (MRI) to rule out spinal cord compression or other issues

## 2016-06-07 NOTE — Discharge Instructions (Signed)
Your MRI confirmed the bony lesions seen in your spine on your recent PET scan.  There is no nerve or spinal cord impingement at this time.  Please follow up with Dr. Mike Gip tomorrow to discuss additional treatment options.  Take Norco as prescribed for severe pain. Do not drink alcohol, drive or participate in any other potentially dangerous activities while taking this medication as it may make you sleepy. Do not take this medication with any other sedating medications, either prescription or over-the-counter. If you were prescribed Percocet or Vicodin, do not take these with acetaminophen (Tylenol) as it is already contained within these medications.   This medication is an opiate (or narcotic) pain medication and can be habit forming.  Use it as little as possible to achieve adequate pain control.  Do not use or use it with extreme caution if you have a history of opiate abuse or dependence.  If you are on a pain contract with your primary care doctor or a pain specialist, be sure to let them know you were prescribed this medication today from the [facility] Emergency Department.  This medication is intended for your use only - do not give any to anyone else and keep it in a secure place where nobody else, especially children, have access to it.  It will also cause or worsen constipation, so you may want to consider taking an over-the-counter stool softener while you are taking this medication.

## 2016-06-07 NOTE — Telephone Encounter (Signed)
CONVERSATION VIA INTERPRETER SERVICES Called to report that she is having weakness in her left leg causing difficulty ambulating thus limiting her mobility. States she took 2 mg of Dexamethasone she had from a previous surgery. I advised that she should not be self medicating, that I will contact Dr Mike Gip and call her back. She asked if she was to start her Ibrance, I advised for her to hold off restarting until I speak with Dr Mike Gip. She is scheduled for MRI on Tuesday and her FU appt is not until April and she would like them moved up. Please advise

## 2016-06-07 NOTE — Telephone Encounter (Signed)
CONVERSATION VIA INTERPRETER SERVICES: Patient advised to go to ER now, she reports that she does not have transportation at this time. Her husband will not be home until 3-4 PM [place]. She refused to have a cab sent for her because she does not want to go alone and her children are in  School right now. She has agreed to go as soon as possible. Explained that the sooner the better because if she has spinal cord compression, she will need radiation as soon as possible. She expressed understanding and states she will attempt to reach her husband

## 2016-06-07 NOTE — ED Notes (Signed)
Pt back form MRI, fluid bolus restarted

## 2016-06-08 LAB — URINALYSIS, COMPLETE (UACMP) WITH MICROSCOPIC
BACTERIA UA: NONE SEEN
Bilirubin Urine: NEGATIVE
Glucose, UA: NEGATIVE mg/dL
Hgb urine dipstick: NEGATIVE
KETONES UR: NEGATIVE mg/dL
LEUKOCYTES UA: NEGATIVE
Nitrite: NEGATIVE
PH: 9 — AB (ref 5.0–8.0)
PROTEIN: NEGATIVE mg/dL
Specific Gravity, Urine: 1.01 (ref 1.005–1.030)

## 2016-06-09 ENCOUNTER — Telehealth: Payer: Self-pay | Admitting: *Deleted

## 2016-06-09 NOTE — Telephone Encounter (Signed)
Husband called me and said do we have the appt for Melinda Tanner yet and it is next week 3/27 2 pm.  She has mri for 11 am does she still need to come for that appt with the other one.  I talked with corcoran and she said she needs to make both appt. And then dr. Mike Gip said to see her on same day at 57 am .  Husband will make sure that he gets her here and hopes that all things work out.

## 2016-06-13 ENCOUNTER — Inpatient Hospital Stay (HOSPITAL_BASED_OUTPATIENT_CLINIC_OR_DEPARTMENT_OTHER): Payer: Medicare HMO | Admitting: Hematology and Oncology

## 2016-06-13 ENCOUNTER — Ambulatory Visit
Admission: RE | Admit: 2016-06-13 | Discharge: 2016-06-13 | Disposition: A | Discharge status: Home / Self Care | Payer: Medicare HMO | Source: Ambulatory Visit | Attending: Radiation Oncology | Admitting: Radiation Oncology

## 2016-06-13 ENCOUNTER — Encounter: Payer: Self-pay | Admitting: Hematology and Oncology

## 2016-06-13 ENCOUNTER — Telehealth: Payer: Self-pay | Admitting: *Deleted

## 2016-06-13 ENCOUNTER — Encounter: Payer: Self-pay | Admitting: Radiation Oncology

## 2016-06-13 ENCOUNTER — Ambulatory Visit
Admission: RE | Admit: 2016-06-13 | Discharge: 2016-06-13 | Disposition: A | Discharge status: Home / Self Care | Payer: Medicare HMO | Source: Ambulatory Visit | Attending: Hematology and Oncology | Admitting: Hematology and Oncology

## 2016-06-13 VITALS — BP 111/75 | HR 89 | Temp 98.0°F | Resp 18 | Wt 157.4 lb

## 2016-06-13 VITALS — BP 116/77 | HR 93 | Temp 98.5°F | Resp 18

## 2016-06-13 DIAGNOSIS — C7951 Secondary malignant neoplasm of bone: Secondary | ICD-10-CM | POA: Insufficient documentation

## 2016-06-13 DIAGNOSIS — C50911 Malignant neoplasm of unspecified site of right female breast: Secondary | ICD-10-CM

## 2016-06-13 DIAGNOSIS — D649 Anemia, unspecified: Secondary | ICD-10-CM | POA: Diagnosis not present

## 2016-06-13 DIAGNOSIS — Z170 Estrogen receptor positive status [ER+]: Secondary | ICD-10-CM | POA: Diagnosis not present

## 2016-06-13 DIAGNOSIS — Z923 Personal history of irradiation: Secondary | ICD-10-CM | POA: Insufficient documentation

## 2016-06-13 DIAGNOSIS — C7931 Secondary malignant neoplasm of brain: Secondary | ICD-10-CM

## 2016-06-13 DIAGNOSIS — Z79899 Other long term (current) drug therapy: Secondary | ICD-10-CM

## 2016-06-13 DIAGNOSIS — Z9013 Acquired absence of bilateral breasts and nipples: Secondary | ICD-10-CM | POA: Diagnosis not present

## 2016-06-13 DIAGNOSIS — Z510 Encounter for antineoplastic radiation therapy: Secondary | ICD-10-CM | POA: Insufficient documentation

## 2016-06-13 DIAGNOSIS — C50919 Malignant neoplasm of unspecified site of unspecified female breast: Secondary | ICD-10-CM | POA: Diagnosis not present

## 2016-06-13 DIAGNOSIS — E538 Deficiency of other specified B group vitamins: Secondary | ICD-10-CM

## 2016-06-13 DIAGNOSIS — M545 Low back pain: Secondary | ICD-10-CM | POA: Insufficient documentation

## 2016-06-13 DIAGNOSIS — Z9221 Personal history of antineoplastic chemotherapy: Secondary | ICD-10-CM | POA: Diagnosis not present

## 2016-06-13 DIAGNOSIS — C3490 Malignant neoplasm of unspecified part of unspecified bronchus or lung: Secondary | ICD-10-CM | POA: Diagnosis not present

## 2016-06-13 MED ORDER — GADOBENATE DIMEGLUMINE 529 MG/ML IV SOLN
15.0000 mL | Freq: Once | INTRAVENOUS | Status: AC | PRN
  Administered 2016-06-13: 14 mL via INTRAVENOUS

## 2016-06-13 NOTE — Progress Notes (Signed)
Melinda Tanner Clinic day:  06/13/16   Chief Complaint: Melinda Tanner is a 46 y.o. 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   HPI:  The patient was last seen in the medical oncology clinic on 05/30/2016.  At that time, she felt good.  She denied any pain.  Exam was unremarkable.  CA 27.29 was 91.1.  We discussed plan for PET scan given the new uptake in right lower rib, T12 and L1 levels of spine.  Other lesions were stable or improved.  She was seen in the [facility] ER on 06/07/2016 with numbness and leg pain. She described sharp left-sided pain that would shoot into the buttock and the posterior thigh and was associated with some numbness. She also had some intermittent cramping in the right toes and anterior aspect of the foot.  She took 2 mg of Decadron, which significantly improved her pain.  Thoracic and lumbar spine MRI on 06/07/2016 revealed multiple low T1/T2 weighted signal, non enhancing lesions within the thoracic spine, which corresponded in size and location to sclerotic lesions demonstrated on the PET CT of 02/18/2016.  There were no new thoracic lesions.  There was minimal residual contrast enhancement within the L5 vertebral body metastatic lesion, decreased compared to the MRI of 08/12/2015.  There was minimal contrast enhancement at the periphery of the lesion within the L1 vertebral body. This lesion was new compared to MRI of 08/12/2015, but unchanged in size and location compared to PET CT of 02/18/2016.  There was no epidural disease, spinal canal stenosis or neural foraminal encroachment.  Symptomatically, she states that her pain started on 06/04/2016.  Pain can radiate down her leg.  Oxycodone makes her sleepy.   Past Medical History:  Diagnosis Date  . Brain cancer ([place]) 2012   Met. from Breast  . Breast cancer ([place]) 2009  . Complication of  anesthesia    nausea, "drops in potassium and magnesium"  . Seizures ([place])     Past Surgical History:  Procedure Laterality Date  . BRAIN SURGERY  2012, 2106  . CESAREAN SECTION    . LAPAROSCOPIC BILATERAL SALPINGO OOPHERECTOMY Bilateral 05/18/2015   Procedure: LAPAROSCOPIC BILATERAL SALPINGO OOPHORECTOMY;  Surgeon: Will Bonnet, MD;  Location: [facility] ORS;  Service: Gynecology;  Laterality: Bilateral;  . MASTECTOMY Bilateral 2010    Family History  Problem Relation Age of Onset  . Cancer Paternal Aunt   . Cancer Paternal Uncle   . Cancer Paternal Grandfather   . Hypertension Brother   . Diabetes Paternal Grandmother   A paternal aunt had breast cancer age 57, a paternal uncle had prostate cancer, and a paternal grandfather had leukemia.  Social History:  reports that she has never smoked. She has never used smokeless tobacco. She reports that she does not drink alcohol or use drugs.  She is from [place].  She moved to [place] in 2015.  She moved to [place] in 04/2014.  She recently moved into the [place] area.  She has 2 children (boy and girl).  Her family will likely be moving to [facility] in December.  They are looking for a Tanner.  The patient is accompanied by the Spanish interpreter today.  Allergies:  Allergies  Allergen Reactions  . Taxol [Paclitaxel] Anaphylaxis  . Aspirin Swelling  . Penicillins Swelling  . Zofran [Ondansetron Hcl] Nausea And Vomiting  Current Medications: Current Outpatient Prescriptions  Medication Sig Dispense Refill  . Cyanocobalamin (VITAMIN B-12 PO) Take by mouth.    . docusate sodium (COLACE) 100 MG capsule Take 1 tablet once or twice daily as needed for constipation while taking narcotic pain medicine 30 capsule 0  . HYDROcodone-acetaminophen (NORCO/VICODIN) 5-325 MG tablet Take 1-2 tablets by mouth every 4 (four) hours as needed for moderate pain. 30 tablet 0  . omeprazole (PRILOSEC) 20 MG capsule Take 20 mg by mouth  daily.  2  . oxycodone (OXY-IR) 5 MG capsule Take 1 capsule (5 mg total) by mouth every 4 (four) hours as needed. (Patient not taking: Reported on 04/04/2016) 30 capsule 0  . palbociclib (IBRANCE) 100 MG capsule Take 1 capsule daily for 21 days and then take 1 week off and repeat cycle again (Take whole with food.) 21 capsule 1  . pantoprazole (PROTONIX) 20 MG tablet Take 1 tablet by mouth daily 30 tablet 3  . potassium chloride SA (K-DUR,KLOR-CON) 20 MEQ tablet Take 1 tablet (20 mEq total) by mouth 2 (two) times daily. for 1 days then 1 pill a day x 2 days. (Patient not taking: Reported on 04/04/2016) 10 tablet 0   No current facility-administered medications for this visit.     Review of Systems:  GENERAL:  Feels tired after oxycodone.  No fevers or sweats.  Weight down 1 pound. PERFORMANCE STATUS (ECOG):  1 HEENT:  No visual changes, runny nose, sore throat, mouth sores or tenderness. Lungs: No shortness of breath or cough.  No hemoptysis. Cardiac:  No chest pain, palpitations, orthopnea, or PND. GI:  No nausea, vomiting, diarrhea, constipation, melena or hematochezia. GU:  No urgency, frequency, dysuria or hematuria. Musculoskeletal: No none or joint pain.  No muscle tenderness. Extremities:  No pain or swelling. Skin:  No rashes or irritation. Neuro:  No headache, numbness or weakness, balance or coordination issues.  Endocrine:  No diabetes, thyroid issues, hot flashes or night sweats. Psych:  No mood changes, depression or anxiety. Pain:  Pain in left hip, sacrum, and radiates down leg. Review of systems:  All other systems reviewed and found to be negative.  Physical Exam: Blood pressure 111/75, pulse 89, temperature 98.78F (36.7 C), temperature source Tympanic, resp. rate 18, weight 157 lb 6 oz (71.4 kg), last menstrual period 03/19/2015. GENERAL:  Well developed, well nourished, woman sitting comfortably in a wheelchair in the exam room in no acute distress.  MENTAL STATUS:   Alert and oriented to person, place and time. HEAD: Wearing a brown cap.  Normocephalic, atraumatic, face symmetric, no Cushingoid features. EYES:  Brown eyes. Pupils equal round and reactive to light and accomodation.  No conjunctivitis or scleral icterus. ENT:  Oropharynx clear without lesion.  Tongue normal. Mucous membranes moist.  RESPIRATORY:  Clear to auscultation without rales, wheezes or rhonchi. CARDIOVASCULAR:  Regular rate and rhythm without murmur, rub or gallop. ABDOMEN:  Soft, non-tender, with active bowel sounds, and no hepatosplenomegaly.  No masses. SKIN:  No rashes, bruises or ulcers. EXTREMITIES:  No edema, no skin discoloration or tenderness.  No palpable cords. LYMPH NODES:  No palpable cervical, supraclavicular, axillary or inguinal adenopathy  NEUROLOGICAL:  Alert & oriented, cranial nerves II-XII intact; motor strength 5/5 lower extremity (limited slightly by pain); sensation intact; no clonus or Babinski.  PSYCH:  Appropriate.     Pathology: 09/03/2007: Right breast core biopsy: infiltrating duct cell carcinoma high grade ER/PR (reportedly positive) Her2 (?) 10/02/2007: Right axillary node core biopsy:  Fragment with metastatic carcinoma 10/23/2007: Left breast core biopsy: DCIS high grade ER/PR (?) 07/13/2008: Right mastectomy: Invasive ductal carcinoma Grade III Nottingham score = tubules 3+ Nuclei 3+ mitoses 2+) 1.7 cm size. No lymph invasion. Tumor cellularity 20%. [id number]. 07/13/2008: Left mastectomy: Residual ductal carcinoma in situ, high nuclear grade, deep margin negative ypTisNO(sn)MX. 10/25/2010: Abdominal and pelvic ultrasound: questionable lesion near gallbladder fossa recommend MRI follow up. 03/02/2011: Left and right frontal excisional brain biopsy: Adenocarcinoma, metastatic, NOS: ER +, PR 5% +, Her 2 NEG. 03/04/2011: [facility] testing of IHC4 residual recurrence risk score of 114 showing an 8 year recurrence reate of 57%. ER POSTIVE, PR POSITIVE, HER 2  POSTIVE (previously documented negative in 2009) cutoff of 361 patient scores 426. ki67 71%.  08/05/2013: Right breast FNA supraclavicular region: positive for metastatic carcinoma. ER/PR/HER2 not reported. 08/19/2014: Right cervical lymph node. Adenocarcinoma with features consistent with metastatic breast carcinoma. ER/PR/HER2 insufficient tissue. 09/04/2014: Repeat craniotomy, resection of right frontal tumor (metastatic breast cancer). ER 90-100% PR 51-60% HER2 - 09/29/2014: Right cervical lymph node, metastatic carcinoma c/w breast primary, ER 100% PR 20% HER2-  Imaging: 08/15/2007: Bilateral Mammogram: Dense breasts with solid lesion at 6:00, 7:00, and 9:00 of right breast, solid lesion at 2:00 position left breast BIRADS 4B. 09/10/2007: Breast MRI: Three solid irregular enhancing lesions of the right breast 2.1 x 3.2, 2.8 x 2.7, and 1.8 x 1.6 cm. Mildly enlarged enhancing nodule right axilla. Left breast clumped enhancement midportion suspicious for malignancy. 03/01/2011: Brain MRI: At least two juxtacortical, intra-axial masses with extensive surrounding vasogenic edema most consistent with intracranial metastasis. 07/22/2013: Brain MRI +/- contrast: interval increase in enhancing component on T2 FLAIR now measuring 1.0cm x 1.1 cm worrisome for progression of disease. 01/21/2014:  PET scan: Hypermetabolic small right supraclavicular and right upper paratracheal lymph nodes suspicious for mets. Hypermetabolic lymph node in the right paratracheal upper mediastinum that measure approximately 0.9 x 0.5cm. (of note no impressions made of areas noted on brain MRI 07/22/13).  01/21/2014: Head MRI: Right frontal enhancing lesion has shown interval increase in size with surrounding edema and local mass effect (measured 1.8x2.1x1.6, previously 1.2x1.1x1.0) 07/16/2014: Head MRI:  Right anterior frontal enhancing mass 2.5 x 2.0 cm compatible with a metastatic lesion has increased and changed in  configuration from previous 2.0 x 1.8 cm, formerly multilobular. 08/06/2014: PET scan:  Multiple bilateral FDG avid low cervical, supraclavicular, upper mediastinal, and right internal mammary lymphadenopathy, likely representing metastatic disease. A cervical node in the right lower neck should be amenable to percutaneous sampling.  12/2014: Head MRI:  Evolution of right frontal postoperative changes status post tumor resection at the vertex. Expected postoperative changes. Minimal marginal enhancement resection bed with some adjacent posterior intrinsic T1 signal again seen. Decreasing T2/FLAIR adjacent parenchymal changes.  Stable resection cavity is left posterior frontal and right parieto-occipital regions. 04/20/2015 : PET scan: Cervical and thoracic nodal metastasis.  There was multifocal osseous metastasis (left transverse T4 process, left humeral head, right iliac wing, and left side of the sacrum).  Incidental findings included right nephrolithiasis.   04/22/2015: Bone scan: Corresponded with PET-CT with metastatic lesions evident in the left humeral head, left posterior aspect of T4, left aspect of S1, and the right iliac crest.  There was no other definite foci of metastatic disease to bone.  Uptake in the skull was most suggestive of the previous right frontal craniotomy. 05/24/2015: Bone density:  T-score of -0.8 in the right femoral neck (normal). 07/07/2015:  Head MRI:  Metastatic breast cancer with 3 resection cavities. The anterior right frontal cavity was positive for nodular marginal enhancement, new from brain MRI report 01/07/2015, and consistent with recurrent disease.  08/12/2015:  Lumbar Spine MRI: Osseous metastatic disease at L3, L4, L5, S1, and in the left iliac bone posteriorly. There was no definite epidural metastatic disease. 08/20/2015:  PET scan:  Response to therapy in the lower cervical and thoracic adenopathy.  There was a mixed response to multifocal osseous metastases  (some new lesions).  12/06/2015:  Head MRI:  Decreased nodular contrast enhancement at the right frontal lobe treatment site.  There was unchanged appearance of treatment sites within the bilateral parietal lobe size without residual or recurrent disease.  There were no new metastatic lesions. 02/18/2016: PET scan: Mixed response to therapy. There hadbeen improvement in right cervical lymphadenopathy and in several osseous lesions. There wereseveral other osseous lesions in the pelvis with increasing hypermetabolism compared to the prior study. As the majority of the osseous lesions wereincreasingly sclerotic, some of this hypermetabolism could simply reflect bony healing. There was no new extraskeletal metastatic disease is noted in the neck, chest, abdomen or pelvis. 03/06/2016: Head MRI: Unchanged small focus of nodular enhancement at the right frontal resection site. There was unchanged appearance of the 2 other resection sites without abnormal enhancement. There was no evidence of new intracranial metastases. 05/23/2016:  Bone scan:  Stable focus of abnormal uptake seen in right frontal skull consistent with prior craniotomy.  There were areas of abnormal uptake identified in the left humeral head, T4 and S1 levels and right iliac crest that were present but decreased in intensity compared to prior exam suggesting improvement.  There was a new foci of abnormal uptake are noted in a right lower rib,T12 and L1 levels of spine concerning for metastatic disease. 06/07/2016:  Thoracic and lumbar spine MRI:  multiple low T1/T2 weighted signal, non enhancing lesions within the thoracic spine, which corresponded in size and location to sclerotic lesions demonstrated on the PET CT of 02/18/2016.  There were no new thoracic lesions.  There was minimal residual contrast enhancement within the L5 vertebral body metastatic lesion, decreased compared to the MRI of 08/12/2015.  There was minimal contrast  enhancement at the periphery of the lesion within the L1 vertebral body. This lesion was new compared to MRI of 08/12/2015, but unchanged in size and location compared to PET CT of 02/18/2016.  There was no epidural disease, spinal canal stenosis or neural foraminal encroachment.    Labs:  No visits with results within 3 Day(s) from this visit.  Latest known visit with results is:  Admission on 06/07/2016, Discharged on 06/08/2016  Component Date Value Ref Range Status  . WBC 06/07/2016 8.1  3.6 - 11.0 K/uL Final  . RBC 06/07/2016 3.65* 3.80 - 5.20 MIL/uL Final  . Hemoglobin 06/07/2016 12.0  12.0 - 16.0 g/dL Final  . HCT 06/07/2016 35.7  35.0 - 47.0 % Final  . MCV 06/07/2016 97.8  80.0 - 100.0 fL Final  . MCH 06/07/2016 33.0  26.0 - 34.0 pg Final  . MCHC 06/07/2016 33.8  32.0 - 36.0 g/dL Final  . RDW 06/07/2016 20.4* 11.5 - 14.5 % Final  . Platelets 06/07/2016 380  150 - 440 K/uL Final  . Neutrophils Relative % 06/07/2016 93  % Final  . Neutro Abs 06/07/2016 7.5* 1.4 - 6.5 K/uL Final  . Lymphocytes Relative 06/07/2016 5  % Final  . Lymphs Abs 06/07/2016 0.4* 1.0 - 3.6  K/uL Final  . Monocytes Relative 06/07/2016 1  % Final  . Monocytes Absolute 06/07/2016 0.1* 0.2 - 0.9 K/uL Final  . Eosinophils Relative 06/07/2016 0  % Final  . Eosinophils Absolute 06/07/2016 0.0  0 - 0.7 K/uL Final  . Basophils Relative 06/07/2016 1  % Final  . Basophils Absolute 06/07/2016 0.1  0 - 0.1 K/uL Final  . Sodium 06/07/2016 140  135 - 145 mmol/L Final  . Potassium 06/07/2016 4.7  3.5 - 5.1 mmol/L Final  . Chloride 06/07/2016 103  101 - 111 mmol/L Final  . CO2 06/07/2016 28  22 - 32 mmol/L Final  . Glucose, Bld 06/07/2016 158* 65 - 99 mg/dL Final  . BUN 06/07/2016 13  6 - 20 mg/dL Final  . Creatinine, Ser 06/07/2016 0.59  0.44 - 1.00 mg/dL Final  . Calcium 06/07/2016 10.0  8.9 - 10.3 mg/dL Final  . Total Protein 06/07/2016 8.7* 6.5 - 8.1 g/dL Final  . Albumin 06/07/2016 4.5  3.5 - 5.0 g/dL Final  .  AST 06/07/2016 29  15 - 41 U/L Final  . ALT 06/07/2016 14  14 - 54 U/L Final  . Alkaline Phosphatase 06/07/2016 75  38 - 126 U/L Final  . Total Bilirubin 06/07/2016 0.5  0.3 - 1.2 mg/dL Final  . GFR calc non Af Amer 06/07/2016 >60  >60 mL/min Final  . GFR calc Af Amer 06/07/2016 >60  >60 mL/min Final   Comment: (NOTE) The eGFR has been calculated using the CKD EPI equation. This calculation has not been validated in all clinical situations. eGFR's persistently <60 mL/min signify possible Chronic Kidney Disease.   . Anion gap 06/07/2016 9  5 - 15 Final  . Color, Urine 06/07/2016 STRAW* YELLOW Final  . APPearance 06/07/2016 CLEAR* CLEAR Final  . Specific Gravity, Urine 06/07/2016 1.010  1.005 - 1.030 Final  . pH 06/07/2016 9.0* 5.0 - 8.0 Final  . Glucose, UA 06/07/2016 NEGATIVE  NEGATIVE mg/dL Final  . Hgb urine dipstick 06/07/2016 NEGATIVE  NEGATIVE Final  . Bilirubin Urine 06/07/2016 NEGATIVE  NEGATIVE Final  . Ketones, ur 06/07/2016 NEGATIVE  NEGATIVE mg/dL Final  . Protein, ur 06/07/2016 NEGATIVE  NEGATIVE mg/dL Final  . Nitrite 06/07/2016 NEGATIVE  NEGATIVE Final  . Leukocytes, UA 06/07/2016 NEGATIVE  NEGATIVE Final  . RBC / HPF 06/07/2016 0-5  0 - 5 RBC/hpf Final  . WBC, UA 06/07/2016 0-5  0 - 5 WBC/hpf Final  . Bacteria, UA 06/07/2016 NONE SEEN  NONE SEEN Final  . Squamous Epithelial / LPF 06/07/2016 0-5* NONE SEEN Final    Assessment:  Melinda Tanner is a 46 y.o. 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  She initially presented in 2009 while living in [place] with multi-focal right breast cancer with positive lymph node(s) which was ER+, PR+(low), and HER2/neu+. She received neoadjuvant chemotherapy (AC x 4 every 3 weeks followed by Taxol/Abraxane + carboplatin weekly x 12) without anti-HER2 treatment.    On 07/14/2008, she underwent bilateral mastectomies followed by reconstruction.  Pathology in the right breast revealed a 1.7 cm grade III  invasive ductal carcinoma.   Zero of 11 lymph nodes on the right were positive for malignancy. 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 Deep margin was negative. Zero of 2 lymph nodes were positive for metastatic disease.  She received adjuvant radiation to the right breast.  She received adjuvant tamoxifen and Zoladex.  She could not tolerate symptoms of joint pain and  tamoxifen was discontinued after 1-2 months.  Zoladex was continued for approximately 1.5 years.  She was diagnosed with brain metastases in 02/2011.  Pathology revealed ER+, PR+(low), and HER2/neu-.  She underwent resection followed by [facility].  On 03/09/2011, original breast tissue (09/03/2007) sent to [facility] testing.  Testing revealed ER+, PR+(borderline), and HER2/neu positive (different than initial testing).  She received Herceptin for 1 year beginning in 2013.  She then began Tykerb and Xeloda after completion of Herceptin (2014).  She was on extremely low, and probably sub-therapeutic, dosing of lapatinib + capecitabine.   She developed right supraclavicular adenopathy in 2015. She was noted to have slow disease progression in the right frontal/parietal lesion with minimal presence of systemic disease on PET scans. Capecitabine + lapatinib were discontinued in 04/2014.   She underwent repeat craniotomy with resection of brain metastasis on 09/04/2014.  Biopsy of the right supraclavicular lymph node  was ER+,PR+,HER2/neu-.  She started tamoxifen in 09/2014, but discontinued it secondary to pain in hip, back, and shoulder.  She restarted tamoxifen on 04/23/2015.  Tamoxifen was discontinued on 08/23/2015 secondary to progressive disease.  She received Zoladex on 04/30/2015.  She underwent laparoscopic bilateral oophorectomy on 05/18/2015.  She receives monthly Xgeva (began 04/30/2015; last 05/30/2016).  She is post-menopausal.  She began Faslodex on 08/23/2015 (last 04/04/2016).  She is s/p 6 cycles of  Ibrance (09/20/2015 - 05/03/2016).  Patient received palliative radiation 30 Gy to the left humerus and lumbar spine from 08/25/2015 - 09/09/2015  CA27.29 has been followed: 177.8 on 04/16/2015, 276.4 on 05/28/2015, 287.5 on 07/13/2015, 333.8 on 07/26/2015, 412.9 on 08/23/2015, 315.6 on 09/20/2015, 188.8 on 10/18/2015, 151.5 on 11/15/2015, 122.7 on 12/10/2015, 94.6 on 01/10/2016, 80.9 on 02/07/2016, 79 on 03/06/2016, 84.8 on 04/04/2016, 85.2 on 05/02/2016, and 91.1 on 05/30/2016.  Bone scan on 05/23/2016 revealed a stable focus of abnormal uptake seen in right frontal skull consistent with prior craniotomy.  There were areas of abnormal uptake identified in the left humeral head, T4 and S1 levels and right iliac crest that were present but decreased in intensity compared to prior exam suggesting possible improvement.  There was a new foci of abnormal uptake are noted in a right lower rib,T12 and L1 levels of spine concerning for metastatic disease  Thoracic and lumbar spine MRI on 06/07/2016 revealed multiple low T1/T2 weighted signal, non enhancing lesions within the thoracic spine, which corresponded in size and location to sclerotic lesions demonstrated on the PET CT of 02/18/2016.  There were no new thoracic lesions.  There was minimal residual contrast enhancement within the L5 vertebral body metastatic lesion, decreased compared to the MRI of 08/12/2015.  There was minimal contrast enhancement at the periphery of the lesion within the L1 vertebral body. This lesion was new compared to MRI of 08/12/2015, but unchanged in size and location compared to PET CT of 02/18/2016.  There was no epidural disease, spinal canal stenosis or neural foraminal encroachment.  Bone density study on 05/24/2015 revealed a T-score of -0.8 in the right femoral neck (normal).  She has a normocytic anemia due to treatment Melinda Tanner, radiation) and B12 deficiency.  Work-up on 10/25/2015 revealed a B12 of 141 (low).  B12 was  205 on 12/13/2015 with an MMA of 171 (normal) on 01/10/2016.  Ferritin was 43. Iron studies included a saturation of 13% and a TIBC of 354. TSH and folate were normal.  Reticulocyte count was 2.2%.  She declined B12 injections.  She started oral B12 1000 mcg  on 11/05/2015.  She takes her B12 sporadically.  B12 level was 205 (low normal) on 12/13/2015.  Diet is good.  She denies any melena or hematochezia.  Symptomatically, has new lower back and hip pain.  Pain is modestly controlled with hydrocodone/acetaminophen 5/325 (makes her sleepy).  Neurologic exam is stable.    Plan: 1.  Review scans from recent ER visit.  Discuss progressive bone disease.  Discuss plan for palliative radiation.  Discuss pain management.  Discuss keeping a pain diary. 2.  Head MRI today. 3.  Consult with Dr. Baruch Gouty today 4.  Discuss plan to change treatment.  Anticipate switch to chemotherapy after radiation complete. 5.  Contact Dr Orpah Cobb ([facility] in [place]) regarding prior regimens. [phone]). 6.  Handicapped parking permit. 7.  RTC in 2 weeks for MD assessment.    Lequita Asal, MD  06/13/2016, 5:35 AM

## 2016-06-13 NOTE — Progress Notes (Signed)
Patient went to ED last Wednesday because of pain in her sacrum and left hip and leg.  MRI performed.  Patient here today for follow up.  States she is still in pain.  Patient presents in wheelchair today.  States she took a pain pill this morning around 7:30 and she feels a little light headed.

## 2016-06-13 NOTE — Telephone Encounter (Signed)
Patient informed that her MRI was stable per Dr. Mike Gip, patient was excited for the news.

## 2016-06-13 NOTE — Progress Notes (Signed)
Radiation Oncology Old patient new area Note  Name: Melinda Tanner   Date:   06/13/2016 MRN:  [id number] DOB: 12-Mar-1971    This 46 y.o. female presents to the clinic today for evaluation of progressive pain in her lower back in patient with known stage IV metastatic breast cancer.  REFERRING PROVIDER: Theotis Burrow*  HPI: Patient is a 46 year old female well known to our department having received multiple courses of palliative radiation therapy for stage IV breast cancer. She initially was treated back in May 2017 for progressive disease in her brain status post. CyberKnife radiation therapy performed in [place] for multiple brain metastasis. We treated her right anterior resection cavity again based on progressive enhancement in that region. Her initial diagnosis dates back to 2009 when she presented with HER-2/neu positive breast cancer and had bilateral mastectomies. We also treated palliatively her lumbar spine as well as her left shoulder. She recently is complaining of increasing lower back pain radiating down into the left lower extremity. MRI scan of her thoracic lumbar spine showing no epidural compression or canal stenosis there is a lesion within L1 vertebral body. Bone scan shows increased increased uptake in T12-L1 as well as S1. The S1 lesion is decreased in intensity. MRI of the brain shows stable findings which was performed today. She is able to ambulate with no motor or sensory levels in her lower extremities. I been asked to evaluate her for possible further palliative treatment to her spine.  COMPLICATIONS OF TREATMENT: none  FOLLOW UP COMPLIANCE: keeps appointments   PHYSICAL EXAM:  BP 116/77   Pulse 93   Temp 98.5 F (36.9 C)   Resp 18   LMP 03/19/2015 Comment: Partial Hyst Range of motion of lower extremities does not elicit pain. No motor or sensory levels appreciated in the lower extremities. Well-developed well-nourished patient in NAD. HEENT  reveals PERLA, EOMI, discs not visualized.  Oral cavity is clear. No oral mucosal lesions are identified. Neck is clear without evidence of cervical or supraclavicular adenopathy. Lungs are clear to A&P. Cardiac examination is essentially unremarkable with regular rate and rhythm without murmur rub or thrill. Abdomen is benign with no organomegaly or masses noted. Motor sensory and DTR levels are equal and symmetric in the upper and lower extremities. Cranial nerves II through XII are grossly intact. Proprioception is intact. No peripheral adenopathy or edema is identified. No motor or sensory levels are noted. Crude visual fields are within normal range.  RADIOLOGY RESULTS: MRI scans of thoracic lumbar spine as well as bone scans are reviewed as well as MRI of her brain all compatible above-stated findings  PLAN: At this time I'm more impressed with the subtle findings at T12-L1 which may be responsible for more from some of her lower back pain and radicular type pain. I would be inclined to treat with Palliative radiation therapy to T12-L1 3000 cGy in 10 fractions if we can avoid overlying fields. The area of S1 may need to be treated in the future although with previous treatment and its low level intensity and bone scan I believe this may be stable at this time. Risks and benefits of treatment including possible diarrhea fatigue alteration of blood counts skin reaction all were described in detail to the patient. I have set her up for treatment planning tomorrow will start her treatments early next week.  I would like to take this opportunity to thank you for allowing me to participate in the care of your  patient.Armstead Peaks., MD

## 2016-06-14 ENCOUNTER — Ambulatory Visit
Admission: RE | Admit: 2016-06-14 | Discharge: 2016-06-14 | Disposition: A | Discharge status: Home / Self Care | Payer: Medicare HMO | Source: Ambulatory Visit | Attending: Radiation Oncology | Admitting: Radiation Oncology

## 2016-06-14 DIAGNOSIS — Z510 Encounter for antineoplastic radiation therapy: Secondary | ICD-10-CM | POA: Diagnosis not present

## 2016-06-14 DIAGNOSIS — Z9013 Acquired absence of bilateral breasts and nipples: Secondary | ICD-10-CM | POA: Diagnosis not present

## 2016-06-14 DIAGNOSIS — M545 Low back pain: Secondary | ICD-10-CM | POA: Diagnosis not present

## 2016-06-14 DIAGNOSIS — Z923 Personal history of irradiation: Secondary | ICD-10-CM | POA: Diagnosis not present

## 2016-06-14 DIAGNOSIS — C7951 Secondary malignant neoplasm of bone: Secondary | ICD-10-CM | POA: Diagnosis not present

## 2016-06-14 DIAGNOSIS — C50919 Malignant neoplasm of unspecified site of unspecified female breast: Secondary | ICD-10-CM | POA: Diagnosis not present

## 2016-06-14 DIAGNOSIS — C50911 Malignant neoplasm of unspecified site of right female breast: Secondary | ICD-10-CM | POA: Diagnosis not present

## 2016-06-14 DIAGNOSIS — Z170 Estrogen receptor positive status [ER+]: Secondary | ICD-10-CM | POA: Diagnosis not present

## 2016-06-14 DIAGNOSIS — C7931 Secondary malignant neoplasm of brain: Secondary | ICD-10-CM | POA: Diagnosis not present

## 2016-06-15 DIAGNOSIS — Z923 Personal history of irradiation: Secondary | ICD-10-CM | POA: Diagnosis not present

## 2016-06-15 DIAGNOSIS — C7951 Secondary malignant neoplasm of bone: Secondary | ICD-10-CM | POA: Diagnosis not present

## 2016-06-15 DIAGNOSIS — C7931 Secondary malignant neoplasm of brain: Secondary | ICD-10-CM | POA: Diagnosis not present

## 2016-06-15 DIAGNOSIS — Z510 Encounter for antineoplastic radiation therapy: Secondary | ICD-10-CM | POA: Diagnosis not present

## 2016-06-15 DIAGNOSIS — Z170 Estrogen receptor positive status [ER+]: Secondary | ICD-10-CM | POA: Diagnosis not present

## 2016-06-15 DIAGNOSIS — C50919 Malignant neoplasm of unspecified site of unspecified female breast: Secondary | ICD-10-CM | POA: Diagnosis not present

## 2016-06-15 DIAGNOSIS — C50911 Malignant neoplasm of unspecified site of right female breast: Secondary | ICD-10-CM | POA: Diagnosis not present

## 2016-06-15 DIAGNOSIS — M545 Low back pain: Secondary | ICD-10-CM | POA: Diagnosis not present

## 2016-06-15 DIAGNOSIS — Z9013 Acquired absence of bilateral breasts and nipples: Secondary | ICD-10-CM | POA: Diagnosis not present

## 2016-06-20 ENCOUNTER — Ambulatory Visit
Admission: RE | Admit: 2016-06-20 | Discharge: 2016-06-20 | Disposition: A | Discharge status: Home / Self Care | Payer: Medicare HMO | Source: Ambulatory Visit | Attending: Radiation Oncology | Admitting: Radiation Oncology

## 2016-06-20 DIAGNOSIS — C50919 Malignant neoplasm of unspecified site of unspecified female breast: Secondary | ICD-10-CM | POA: Diagnosis not present

## 2016-06-20 DIAGNOSIS — C50911 Malignant neoplasm of unspecified site of right female breast: Secondary | ICD-10-CM | POA: Diagnosis not present

## 2016-06-20 DIAGNOSIS — Z170 Estrogen receptor positive status [ER+]: Secondary | ICD-10-CM | POA: Diagnosis not present

## 2016-06-20 DIAGNOSIS — M545 Low back pain: Secondary | ICD-10-CM | POA: Diagnosis not present

## 2016-06-20 DIAGNOSIS — Z510 Encounter for antineoplastic radiation therapy: Secondary | ICD-10-CM | POA: Diagnosis not present

## 2016-06-20 DIAGNOSIS — Z923 Personal history of irradiation: Secondary | ICD-10-CM | POA: Diagnosis not present

## 2016-06-20 DIAGNOSIS — C7951 Secondary malignant neoplasm of bone: Secondary | ICD-10-CM | POA: Diagnosis not present

## 2016-06-20 DIAGNOSIS — Z9013 Acquired absence of bilateral breasts and nipples: Secondary | ICD-10-CM | POA: Diagnosis not present

## 2016-06-20 DIAGNOSIS — C7931 Secondary malignant neoplasm of brain: Secondary | ICD-10-CM | POA: Diagnosis not present

## 2016-06-21 ENCOUNTER — Ambulatory Visit
Admission: RE | Admit: 2016-06-21 | Discharge: 2016-06-21 | Disposition: A | Discharge status: Home / Self Care | Payer: Medicare HMO | Source: Ambulatory Visit | Attending: Radiation Oncology | Admitting: Radiation Oncology

## 2016-06-21 DIAGNOSIS — Z170 Estrogen receptor positive status [ER+]: Secondary | ICD-10-CM | POA: Diagnosis not present

## 2016-06-21 DIAGNOSIS — Z510 Encounter for antineoplastic radiation therapy: Secondary | ICD-10-CM | POA: Diagnosis not present

## 2016-06-21 DIAGNOSIS — C7931 Secondary malignant neoplasm of brain: Secondary | ICD-10-CM | POA: Diagnosis not present

## 2016-06-21 DIAGNOSIS — M545 Low back pain: Secondary | ICD-10-CM | POA: Diagnosis not present

## 2016-06-21 DIAGNOSIS — C50911 Malignant neoplasm of unspecified site of right female breast: Secondary | ICD-10-CM | POA: Diagnosis not present

## 2016-06-21 DIAGNOSIS — C7951 Secondary malignant neoplasm of bone: Secondary | ICD-10-CM | POA: Diagnosis not present

## 2016-06-21 DIAGNOSIS — Z9013 Acquired absence of bilateral breasts and nipples: Secondary | ICD-10-CM | POA: Diagnosis not present

## 2016-06-21 DIAGNOSIS — Z923 Personal history of irradiation: Secondary | ICD-10-CM | POA: Diagnosis not present

## 2016-06-22 ENCOUNTER — Ambulatory Visit
Admission: RE | Admit: 2016-06-22 | Discharge: 2016-06-22 | Disposition: A | Discharge status: Home / Self Care | Payer: Medicare HMO | Source: Ambulatory Visit | Attending: Radiation Oncology | Admitting: Radiation Oncology

## 2016-06-22 DIAGNOSIS — Z170 Estrogen receptor positive status [ER+]: Secondary | ICD-10-CM | POA: Diagnosis not present

## 2016-06-22 DIAGNOSIS — M545 Low back pain: Secondary | ICD-10-CM | POA: Diagnosis not present

## 2016-06-22 DIAGNOSIS — Z9013 Acquired absence of bilateral breasts and nipples: Secondary | ICD-10-CM | POA: Diagnosis not present

## 2016-06-22 DIAGNOSIS — C7951 Secondary malignant neoplasm of bone: Secondary | ICD-10-CM | POA: Diagnosis not present

## 2016-06-22 DIAGNOSIS — Z510 Encounter for antineoplastic radiation therapy: Secondary | ICD-10-CM | POA: Diagnosis not present

## 2016-06-22 DIAGNOSIS — C50911 Malignant neoplasm of unspecified site of right female breast: Secondary | ICD-10-CM | POA: Diagnosis not present

## 2016-06-22 DIAGNOSIS — C7931 Secondary malignant neoplasm of brain: Secondary | ICD-10-CM | POA: Diagnosis not present

## 2016-06-22 DIAGNOSIS — Z923 Personal history of irradiation: Secondary | ICD-10-CM | POA: Diagnosis not present

## 2016-06-23 ENCOUNTER — Ambulatory Visit
Admission: RE | Admit: 2016-06-23 | Discharge: 2016-06-23 | Disposition: A | Discharge status: Home / Self Care | Payer: Medicare HMO | Source: Ambulatory Visit | Attending: Radiation Oncology | Admitting: Radiation Oncology

## 2016-06-23 DIAGNOSIS — C7931 Secondary malignant neoplasm of brain: Secondary | ICD-10-CM | POA: Diagnosis not present

## 2016-06-23 DIAGNOSIS — Z510 Encounter for antineoplastic radiation therapy: Secondary | ICD-10-CM | POA: Diagnosis not present

## 2016-06-23 DIAGNOSIS — Z9013 Acquired absence of bilateral breasts and nipples: Secondary | ICD-10-CM | POA: Diagnosis not present

## 2016-06-23 DIAGNOSIS — Z923 Personal history of irradiation: Secondary | ICD-10-CM | POA: Diagnosis not present

## 2016-06-23 DIAGNOSIS — C50911 Malignant neoplasm of unspecified site of right female breast: Secondary | ICD-10-CM | POA: Diagnosis not present

## 2016-06-23 DIAGNOSIS — C7951 Secondary malignant neoplasm of bone: Secondary | ICD-10-CM | POA: Diagnosis not present

## 2016-06-23 DIAGNOSIS — Z170 Estrogen receptor positive status [ER+]: Secondary | ICD-10-CM | POA: Diagnosis not present

## 2016-06-23 DIAGNOSIS — M545 Low back pain: Secondary | ICD-10-CM | POA: Diagnosis not present

## 2016-06-26 ENCOUNTER — Ambulatory Visit
Admission: RE | Admit: 2016-06-26 | Discharge: 2016-06-26 | Disposition: A | Discharge status: Home / Self Care | Payer: Medicare HMO | Source: Ambulatory Visit | Attending: Radiation Oncology | Admitting: Radiation Oncology

## 2016-06-26 DIAGNOSIS — M545 Low back pain: Secondary | ICD-10-CM | POA: Diagnosis not present

## 2016-06-26 DIAGNOSIS — Z170 Estrogen receptor positive status [ER+]: Secondary | ICD-10-CM | POA: Diagnosis not present

## 2016-06-26 DIAGNOSIS — C50911 Malignant neoplasm of unspecified site of right female breast: Secondary | ICD-10-CM | POA: Diagnosis not present

## 2016-06-26 DIAGNOSIS — C7931 Secondary malignant neoplasm of brain: Secondary | ICD-10-CM | POA: Diagnosis not present

## 2016-06-26 DIAGNOSIS — C7951 Secondary malignant neoplasm of bone: Secondary | ICD-10-CM | POA: Diagnosis not present

## 2016-06-26 DIAGNOSIS — Z510 Encounter for antineoplastic radiation therapy: Secondary | ICD-10-CM | POA: Diagnosis not present

## 2016-06-26 DIAGNOSIS — Z9013 Acquired absence of bilateral breasts and nipples: Secondary | ICD-10-CM | POA: Diagnosis not present

## 2016-06-26 DIAGNOSIS — Z923 Personal history of irradiation: Secondary | ICD-10-CM | POA: Diagnosis not present

## 2016-06-27 ENCOUNTER — Ambulatory Visit: Payer: Medicare HMO | Admitting: Hematology and Oncology

## 2016-06-27 ENCOUNTER — Ambulatory Visit
Admission: RE | Admit: 2016-06-27 | Discharge: 2016-06-27 | Disposition: A | Discharge status: Home / Self Care | Payer: Medicare HMO | Source: Ambulatory Visit | Attending: Radiation Oncology | Admitting: Radiation Oncology

## 2016-06-27 DIAGNOSIS — Z9013 Acquired absence of bilateral breasts and nipples: Secondary | ICD-10-CM | POA: Diagnosis not present

## 2016-06-27 DIAGNOSIS — Z170 Estrogen receptor positive status [ER+]: Secondary | ICD-10-CM | POA: Diagnosis not present

## 2016-06-27 DIAGNOSIS — C50911 Malignant neoplasm of unspecified site of right female breast: Secondary | ICD-10-CM | POA: Diagnosis not present

## 2016-06-27 DIAGNOSIS — Z923 Personal history of irradiation: Secondary | ICD-10-CM | POA: Diagnosis not present

## 2016-06-27 DIAGNOSIS — M545 Low back pain: Secondary | ICD-10-CM | POA: Diagnosis not present

## 2016-06-27 DIAGNOSIS — C50919 Malignant neoplasm of unspecified site of unspecified female breast: Secondary | ICD-10-CM | POA: Diagnosis not present

## 2016-06-27 DIAGNOSIS — C7931 Secondary malignant neoplasm of brain: Secondary | ICD-10-CM | POA: Diagnosis not present

## 2016-06-27 DIAGNOSIS — Z510 Encounter for antineoplastic radiation therapy: Secondary | ICD-10-CM | POA: Diagnosis not present

## 2016-06-27 DIAGNOSIS — C7951 Secondary malignant neoplasm of bone: Secondary | ICD-10-CM | POA: Diagnosis not present

## 2016-06-28 ENCOUNTER — Ambulatory Visit
Admission: RE | Admit: 2016-06-28 | Discharge: 2016-06-28 | Disposition: A | Discharge status: Home / Self Care | Payer: Medicare HMO | Source: Ambulatory Visit | Attending: Radiation Oncology | Admitting: Radiation Oncology

## 2016-06-28 DIAGNOSIS — Z923 Personal history of irradiation: Secondary | ICD-10-CM | POA: Diagnosis not present

## 2016-06-28 DIAGNOSIS — M545 Low back pain: Secondary | ICD-10-CM | POA: Diagnosis not present

## 2016-06-28 DIAGNOSIS — Z170 Estrogen receptor positive status [ER+]: Secondary | ICD-10-CM | POA: Diagnosis not present

## 2016-06-28 DIAGNOSIS — C7931 Secondary malignant neoplasm of brain: Secondary | ICD-10-CM | POA: Diagnosis not present

## 2016-06-28 DIAGNOSIS — Z9013 Acquired absence of bilateral breasts and nipples: Secondary | ICD-10-CM | POA: Diagnosis not present

## 2016-06-28 DIAGNOSIS — Z510 Encounter for antineoplastic radiation therapy: Secondary | ICD-10-CM | POA: Diagnosis not present

## 2016-06-28 DIAGNOSIS — C50911 Malignant neoplasm of unspecified site of right female breast: Secondary | ICD-10-CM | POA: Diagnosis not present

## 2016-06-28 DIAGNOSIS — C7951 Secondary malignant neoplasm of bone: Secondary | ICD-10-CM | POA: Diagnosis not present

## 2016-06-29 ENCOUNTER — Ambulatory Visit
Admission: RE | Admit: 2016-06-29 | Discharge: 2016-06-29 | Disposition: A | Discharge status: Home / Self Care | Payer: Medicare HMO | Source: Ambulatory Visit | Attending: Radiation Oncology | Admitting: Radiation Oncology

## 2016-06-29 DIAGNOSIS — C7931 Secondary malignant neoplasm of brain: Secondary | ICD-10-CM | POA: Diagnosis not present

## 2016-06-29 DIAGNOSIS — Z9013 Acquired absence of bilateral breasts and nipples: Secondary | ICD-10-CM | POA: Diagnosis not present

## 2016-06-29 DIAGNOSIS — M545 Low back pain: Secondary | ICD-10-CM | POA: Diagnosis not present

## 2016-06-29 DIAGNOSIS — Z170 Estrogen receptor positive status [ER+]: Secondary | ICD-10-CM | POA: Diagnosis not present

## 2016-06-29 DIAGNOSIS — C7951 Secondary malignant neoplasm of bone: Secondary | ICD-10-CM | POA: Diagnosis not present

## 2016-06-29 DIAGNOSIS — Z923 Personal history of irradiation: Secondary | ICD-10-CM | POA: Diagnosis not present

## 2016-06-29 DIAGNOSIS — C50911 Malignant neoplasm of unspecified site of right female breast: Secondary | ICD-10-CM | POA: Diagnosis not present

## 2016-06-29 DIAGNOSIS — Z510 Encounter for antineoplastic radiation therapy: Secondary | ICD-10-CM | POA: Diagnosis not present

## 2016-06-30 ENCOUNTER — Encounter: Payer: Self-pay | Admitting: Hematology and Oncology

## 2016-06-30 ENCOUNTER — Ambulatory Visit
Admission: RE | Admit: 2016-06-30 | Discharge: 2016-06-30 | Disposition: A | Discharge status: Home / Self Care | Payer: Medicare HMO | Source: Ambulatory Visit | Attending: Radiation Oncology | Admitting: Radiation Oncology

## 2016-06-30 ENCOUNTER — Inpatient Hospital Stay: Payer: Medicare HMO

## 2016-06-30 ENCOUNTER — Inpatient Hospital Stay (HOSPITAL_BASED_OUTPATIENT_CLINIC_OR_DEPARTMENT_OTHER): Payer: Medicare HMO | Admitting: Hematology and Oncology

## 2016-06-30 ENCOUNTER — Inpatient Hospital Stay: Payer: Medicare HMO | Attending: Hematology and Oncology

## 2016-06-30 VITALS — BP 103/67 | HR 81 | Temp 98.7°F | Resp 18 | Wt 158.4 lb

## 2016-06-30 DIAGNOSIS — C50911 Malignant neoplasm of unspecified site of right female breast: Secondary | ICD-10-CM

## 2016-06-30 DIAGNOSIS — C7931 Secondary malignant neoplasm of brain: Secondary | ICD-10-CM

## 2016-06-30 DIAGNOSIS — D701 Agranulocytosis secondary to cancer chemotherapy: Secondary | ICD-10-CM

## 2016-06-30 DIAGNOSIS — Z170 Estrogen receptor positive status [ER+]: Secondary | ICD-10-CM | POA: Insufficient documentation

## 2016-06-30 DIAGNOSIS — C50919 Malignant neoplasm of unspecified site of unspecified female breast: Secondary | ICD-10-CM

## 2016-06-30 DIAGNOSIS — D649 Anemia, unspecified: Secondary | ICD-10-CM

## 2016-06-30 DIAGNOSIS — Z85841 Personal history of malignant neoplasm of brain: Secondary | ICD-10-CM

## 2016-06-30 DIAGNOSIS — Z9221 Personal history of antineoplastic chemotherapy: Secondary | ICD-10-CM | POA: Insufficient documentation

## 2016-06-30 DIAGNOSIS — E538 Deficiency of other specified B group vitamins: Secondary | ICD-10-CM | POA: Diagnosis not present

## 2016-06-30 DIAGNOSIS — Z510 Encounter for antineoplastic radiation therapy: Secondary | ICD-10-CM | POA: Diagnosis not present

## 2016-06-30 DIAGNOSIS — T451X5A Adverse effect of antineoplastic and immunosuppressive drugs, initial encounter: Secondary | ICD-10-CM

## 2016-06-30 DIAGNOSIS — C7951 Secondary malignant neoplasm of bone: Secondary | ICD-10-CM

## 2016-06-30 DIAGNOSIS — Z923 Personal history of irradiation: Secondary | ICD-10-CM | POA: Diagnosis not present

## 2016-06-30 DIAGNOSIS — Z9013 Acquired absence of bilateral breasts and nipples: Secondary | ICD-10-CM | POA: Diagnosis not present

## 2016-06-30 DIAGNOSIS — M545 Low back pain: Secondary | ICD-10-CM | POA: Diagnosis not present

## 2016-06-30 DIAGNOSIS — G893 Neoplasm related pain (acute) (chronic): Secondary | ICD-10-CM

## 2016-06-30 LAB — COMPREHENSIVE METABOLIC PANEL
ALT: 10 U/L — ABNORMAL LOW (ref 14–54)
AST: 20 U/L (ref 15–41)
Albumin: 4 g/dL (ref 3.5–5.0)
Alkaline Phosphatase: 61 U/L (ref 38–126)
Anion gap: 5 (ref 5–15)
BUN: 18 mg/dL (ref 6–20)
CO2: 29 mmol/L (ref 22–32)
Calcium: 9.2 mg/dL (ref 8.9–10.3)
Chloride: 105 mmol/L (ref 101–111)
Creatinine, Ser: 0.47 mg/dL (ref 0.44–1.00)
GFR calc Af Amer: >60 mL/min (ref >60)
GFR calc non Af Amer: >60 mL/min (ref >60)
Glucose, Bld: 89 mg/dL (ref 65–99)
Potassium: 4.3 mmol/L (ref 3.5–5.1)
Sodium: 139 mmol/L (ref 135–145)
Total Bilirubin: 0.7 mg/dL (ref 0.3–1.2)
Total Protein: 7.5 g/dL (ref 6.5–8.1)

## 2016-06-30 LAB — CBC WITH DIFFERENTIAL/PLATELET
Basophils Absolute: 0 10*3/uL (ref 0–0.1)
Basophils Relative: 2 %
Eosinophils Absolute: 0.1 10*3/uL (ref 0–0.7)
Eosinophils Relative: 4 %
HCT: 30.8 % — ABNORMAL LOW (ref 35.0–47.0)
Hemoglobin: 10.5 g/dL — ABNORMAL LOW (ref 12.0–16.0)
Lymphocytes Relative: 18 %
Lymphs Abs: 0.4 10*3/uL — ABNORMAL LOW (ref 1.0–3.6)
MCH: 33.6 pg (ref 26.0–34.0)
MCHC: 34.3 g/dL (ref 32.0–36.0)
MCV: 98 fL (ref 80.0–100.0)
Monocytes Absolute: 0.3 10*3/uL (ref 0.2–0.9)
Monocytes Relative: 14 %
Neutro Abs: 1.5 10*3/uL (ref 1.4–6.5)
Neutrophils Relative %: 62 %
Platelets: 230 10*3/uL (ref 150–440)
RBC: 3.14 MIL/uL — ABNORMAL LOW (ref 3.80–5.20)
RDW: 20.4 % — ABNORMAL HIGH (ref 11.5–14.5)
WBC: 2.4 10*3/uL — ABNORMAL LOW (ref 3.6–11.0)

## 2016-06-30 MED ORDER — DENOSUMAB 120 MG/1.7ML ~~LOC~~ SOLN
120.0000 mg | Freq: Once | SUBCUTANEOUS | Status: AC
  Administered 2016-06-30: 120 mg via SUBCUTANEOUS
  Filled 2016-06-30: qty 1.7

## 2016-06-30 NOTE — Progress Notes (Signed)
[facility] day:  06/30/16   Chief Complaint: Melinda Tanner is a 46 y.o. 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   HPI:  The patient was last seen in the medical oncology clinic on 06/13/2016.  At that time, she was seen for assessment after interval ER evaluation for pain.  Imaging revealed progressive disease on MRI imaging of the back.  Head MRI on 06/13/2016 revealed no evidence of residual or recurrent disease.  There was a stable 5 mm nodular enhancement along the surface of the brain in the right frontal treatment area.  She was seen by radiation oncology.  Plan was for palliative radiation therapy to T12-L1 of 3000 cGy in 10 fractions.  Radiation began 06/21/2016.  She was on Ibrance from 06/18/2016 - 06/21/2016.  Symptomatically, she is feeling better.  Pain is controlled.   Past Medical History:  Diagnosis Date  . Brain cancer ([place]) 2012   Met. from Breast  . Breast cancer ([place]) 2009  . Complication of anesthesia    nausea, "drops in potassium and magnesium"  . Seizures ([place])     Past Surgical History:  Procedure Laterality Date  . BRAIN SURGERY  2012, 2106  . CESAREAN SECTION    . LAPAROSCOPIC BILATERAL SALPINGO OOPHERECTOMY Bilateral 05/18/2015   Procedure: LAPAROSCOPIC BILATERAL SALPINGO OOPHORECTOMY;  Surgeon: Will Bonnet, MD;  Location: [facility] ORS;  Service: Gynecology;  Laterality: Bilateral;  . MASTECTOMY Bilateral 2010    Family History  Problem Relation Age of Onset  . Cancer Paternal Aunt   . Cancer Paternal Uncle   . Cancer Paternal Grandfather   . Hypertension Brother   . Diabetes Paternal Grandmother   A paternal aunt had breast cancer age 73, a paternal uncle had prostate cancer, and a paternal grandfather had leukemia.  Social History:  reports that she has never smoked. She has never used smokeless tobacco. She reports that she  does not drink alcohol or use drugs.  She is from [place].  She moved to [place] in 2015.  She moved to [place] in 04/2014.  She recently moved into the [place] area.  She has 2 children (boy and girl).  Her family will likely be moving to [facility] in December.  They are looking for a Tanner.  The patient is accompanied by her husband and the Spanish interpreter today.  Allergies:  Allergies  Allergen Reactions  . Taxol [Paclitaxel] Anaphylaxis  . Aspirin Swelling  . Penicillins Swelling  . Zofran [Ondansetron Hcl] Nausea And Vomiting    Current Medications: Current Outpatient Prescriptions  Medication Sig Dispense Refill  . Cyanocobalamin (VITAMIN B-12 PO) Take by mouth.    . docusate sodium (COLACE) 100 MG capsule Take 1 tablet once or twice daily as needed for constipation while taking narcotic pain medicine 30 capsule 0  . HYDROcodone-acetaminophen (NORCO/VICODIN) 5-325 MG tablet Take 1-2 tablets by mouth every 4 (four) hours as needed for moderate pain. 30 tablet 0  . ibuprofen (ADVIL,MOTRIN) 800 MG tablet Take 800 mg by mouth every 8 (eight) hours as needed (Takes 1/2 tablet prn).    Marland Kitchen omeprazole (PRILOSEC) 20 MG capsule Take 20 mg by mouth daily.  2  . pantoprazole (PROTONIX) 20 MG tablet Take 1 tablet by mouth daily 30 tablet 3  . oxycodone (OXY-IR) 5 MG capsule Take 1 capsule (5 mg total) by mouth every 4 (four) hours as  needed. (Patient not taking: Reported on 06/13/2016) 30 capsule 0  . palbociclib (IBRANCE) 100 MG capsule Take 1 capsule daily for 21 days and then take 1 week off and repeat cycle again (Take whole with food.) (Patient not taking: Reported on 06/13/2016) 21 capsule 1  . potassium chloride SA (K-DUR,KLOR-CON) 20 MEQ tablet Take 1 tablet (20 mEq total) by mouth 2 (two) times daily. for 1 days then 1 pill a day x 2 days. (Patient not taking: Reported on 04/04/2016) 10 tablet 0   No current facility-administered medications for this visit.     Review of  Systems:  GENERAL:  Feels "ok".  No fevers or sweats.  Weight up 1 pound. PERFORMANCE STATUS (ECOG):  1 HEENT:  No visual changes, runny nose, sore throat, mouth sores or tenderness. Lungs: No shortness of breath or cough.  No hemoptysis. Cardiac:  No chest pain, palpitations, orthopnea, or PND. GI:  No nausea, vomiting, diarrhea, constipation, melena or hematochezia.  GU:  No urgency, frequency, dysuria or hematuria. Musculoskeletal: Lower back and hip pain, improved.  No muscle tenderness. Extremities:  No pain or swelling. Skin:  No rashes or irritation. Neuro:  Intermittent pain radiating down leg.  No headache, numbness or weakness, balance or coordination issues.  Endocrine:  No diabetes, thyroid issues, hot flashes or night sweats. Psych:  No mood changes, depression or anxiety. Pain:  Lower back and hip pain. Review of systems:  All other systems reviewed and found to be negative.  Physical Exam: Blood pressure 103/67, pulse 81, temperature 98.7 F (37.1 C), temperature source Tympanic, resp. rate 18, weight 158 lb 7 oz (71.9 kg), last menstrual period 03/19/2015. GENERAL:  Well developed, well nourished, woman sitting comfortably in the exam room in no acute distress.  MENTAL STATUS:  Alert and oriented to person, place and time. HEAD: Wearing a white cap.  Normocephalic, atraumatic, face symmetric, no Cushingoid features. EYES:  Brown eyes. Pupils equal round and reactive to light and accomodation.  No conjunctivitis or scleral icterus. ENT:  Oropharynx clear without lesion.  Tongue normal. Mucous membranes moist.  RESPIRATORY:  Clear to auscultation without rales, wheezes or rhonchi. CARDIOVASCULAR:  Regular rate and rhythm without murmur, rub or gallop. ABDOMEN:  Soft, non-tender, with active bowel sounds, and no hepatosplenomegaly.  No masses. SKIN:  No rashes, bruises or ulcers. EXTREMITIES:  No edema, no skin discoloration or tenderness.  No palpable cords. LYMPH NODES:   No palpable cervical, supraclavicular, axillary or inguinal adenopathy  NEUROLOGICAL:  Appropriate. PSYCH:  Appropriate.     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 No lymph invasion. Tumor cellularity 20%. [id number]. 07/13/2008: Left mastectomy: Residual ductal carcinoma in situ, high nuclear grade, deep margin negative ypTisNO(sn)MX. 10/25/2010: Abdominal and pelvic ultrasound: questionable lesion near gallbladder fossa recommend MRI follow up. 03/02/2011: Left and right frontal excisional brain biopsy: Adenocarcinoma, metastatic, NOS: ER +, PR 5% +, Her 2 NEG. 03/04/2011: [facility] testing of IHC4 residual recurrence risk score of 114 showing an 8 year recurrence reate of 57%. ER POSTIVE, PR POSITIVE, HER 2 POSITIVE (previously documented negative in 2009) cutoff of 361 patient scores 426. ki67 71%.  08/05/2013: Right breast FNA supraclavicular region: positive for metastatic carcinoma.  ER/PR/HER2 not reported. 08/19/2014: Right cervical lymph node. Adenocarcinoma with features consistent with metastatic breast carcinoma. ER/PR/HER2 insufficient tissue. 09/04/2014: Repeat craniotomy, resection of right frontal tumor (metastatic breast cancer). ER 90-100% PR 51-60% HER2 - 09/29/2014: Right cervical lymph node, metastatic carcinoma c/w breast primary, ER 100% PR 20% HER2-  Imaging: 08/15/2007: Bilateral Mammogram: Dense breasts with solid lesion at 6:00, 7:00, and 9:00 of right breast, solid lesion at 2:00 position left breast BIRADS 4B. 09/10/2007: Breast MRI: Three solid irregular enhancing lesions of the right breast 2.1 x 3.2, 2.8 x  2.7, and 1.8 x 1.6 cm. Mildly enlarged enhancing nodule right axilla. Left breast clumped enhancement midportion suspicious for malignancy. 03/01/2011: Brain MRI: At least two juxtacortical, intra-axial masses with extensive surrounding vasogenic edema most consistent with intracranial metastasis. 07/22/2013: Brain MRI +/- contrast: interval increase in enhancing component on T2 FLAIR now measuring 1.0cm x 1.1 cm worrisome for progression of disease. 01/21/2014:  PET scan: Hypermetabolic small right supraclavicular and right upper paratracheal lymph nodes suspicious for mets. Hypermetabolic lymph node in the right paratracheal upper mediastinum that measure approximately 0.9 x 0.5cm. (of note no impressions made of areas noted on brain MRI 07/22/13).  01/21/2014: Head MRI: Right frontal enhancing lesion has shown interval increase in size with surrounding edema and local mass effect (measured 1.8x2.1x1.6, previously 1.2x1.1x1.0) 07/16/2014: Head MRI:  Right anterior frontal enhancing mass 2.5 x 2.0 cm compatible with a metastatic lesion has increased and changed in configuration from previous 2.0 x 1.8 cm, formerly multilobular. 08/06/2014: PET scan:  Multiple bilateral FDG avid low cervical, supraclavicular, upper mediastinal, and right internal mammary lymphadenopathy, likely representing metastatic disease. A cervical node in the right lower neck should be amenable to percutaneous sampling.  12/2014: Head MRI:  Evolution of right frontal postoperative changes status post tumor resection at the vertex. Expected postoperative changes. Minimal marginal enhancement resection bed with some adjacent posterior intrinsic T1 signal again seen. Decreasing T2/FLAIR adjacent parenchymal changes.  Stable resection cavity is left posterior frontal and right parieto-occipital regions. 04/20/2015 : PET scan: Cervical and thoracic nodal metastasis.  There was multifocal osseous metastasis (left transverse T4 process, left  humeral head, right iliac wing, and left side of the sacrum).  Incidental findings included right nephrolithiasis.   04/22/2015: Bone scan: Corresponded with PET-CT with metastatic lesions evident in the left humeral head, left posterior aspect of T4, left aspect of S1, and the right iliac crest.  There was no other definite foci of metastatic disease to bone.  Uptake in the skull was most suggestive of the previous right frontal craniotomy. 05/24/2015: Bone density:  T-score of -0.8 in the right femoral neck (normal). 07/07/2015:  Head MRI:  Metastatic breast cancer with 3 resection cavities. The anterior right frontal cavity was positive for nodular marginal enhancement, new from brain MRI report 01/07/2015, and consistent with recurrent disease.  08/12/2015:  Lumbar Spine MRI: Osseous metastatic disease at L3, L4, L5, S1, and in the left iliac bone posteriorly. There was no definite epidural metastatic disease. 08/20/2015:  PET scan:  Response to therapy in the lower cervical and thoracic adenopathy.  There was a mixed response to multifocal osseous metastases (some new lesions).  12/06/2015:  Head MRI:  Decreased nodular contrast enhancement at the right frontal lobe treatment site.  There was unchanged appearance of treatment sites within the bilateral parietal lobe size without residual or recurrent disease.  There were no new metastatic lesions. 02/18/2016: PET scan: Mixed response to therapy. There hadbeen improvement in  right cervical lymphadenopathy and in several osseous lesions. There wereseveral other osseous lesions in the pelvis with increasing hypermetabolism compared to the prior study. As the majority of the osseous lesions wereincreasingly sclerotic, some of this hypermetabolism could simply reflect bony healing. There was no new extraskeletal metastatic disease is noted in the neck, chest, abdomen or pelvis. 03/06/2016: Head MRI: Unchanged small focus of nodular enhancement at the  right frontal resection site. There was unchanged appearance of the 2 other resection sites without abnormal enhancement. There was no evidence of new intracranial metastases. 05/23/2016:  Bone scan:  Stable focus of abnormal uptake seen in right frontal skull consistent with prior craniotomy.  There were areas of abnormal uptake identified in the left humeral head, T4 and S1 levels and right iliac crest that were present but decreased in intensity compared to prior exam suggesting improvement.  There was a new foci of abnormal uptake are noted in a right lower rib,T12 and L1 levels of spine concerning for metastatic disease. 06/07/2016:  Thoracic and lumbar spine MRI:  multiple low T1/T2 weighted signal, non enhancing lesions within the thoracic spine, which corresponded in size and location to sclerotic lesions demonstrated on the PET CT of 02/18/2016.  There were no new thoracic lesions.  There was minimal residual contrast enhancement within the L5 vertebral body metastatic lesion, decreased compared to the MRI of 08/12/2015.  There was minimal contrast enhancement at the periphery of the lesion within the L1 vertebral body. This lesion was new compared to MRI of 08/12/2015, but unchanged in size and location compared to PET CT of 02/18/2016.  There was no epidural disease, spinal canal stenosis or neural foraminal encroachment. 06/13/2016:  Head MRI revealed no evidence of residual or recurrent disease.   Labs:  Appointment on 06/30/2016  Component Date Value Ref Range Status  . Sodium 06/30/2016 139  135 - 145 mmol/L Final  . Potassium 06/30/2016 4.3  3.5 - 5.1 mmol/L Final  . Chloride 06/30/2016 105  101 - 111 mmol/L Final  . CO2 06/30/2016 29  22 - 32 mmol/L Final  . Glucose, Bld 06/30/2016 89  65 - 99 mg/dL Final  . BUN 06/30/2016 18  6 - 20 mg/dL Final  . Creatinine, Ser 06/30/2016 0.47  0.44 - 1.00 mg/dL Final  . Calcium 06/30/2016 9.2  8.9 - 10.3 mg/dL Final  . Total Protein  06/30/2016 7.5  6.5 - 8.1 g/dL Final  . Albumin 06/30/2016 4.0  3.5 - 5.0 g/dL Final  . AST 06/30/2016 20  15 - 41 U/L Final  . ALT 06/30/2016 10* 14 - 54 U/L Final  . Alkaline Phosphatase 06/30/2016 61  38 - 126 U/L Final  . Total Bilirubin 06/30/2016 0.7  0.3 - 1.2 mg/dL Final  . GFR calc non Af Amer 06/30/2016 >60  >60 mL/min Final  . GFR calc Af Amer 06/30/2016 >60  >60 mL/min Final   Comment: (NOTE) The eGFR has been calculated using the CKD EPI equation. This calculation has not been validated in all clinical situations. eGFR's persistently <60 mL/min signify possible Chronic Kidney Disease.   . Anion gap 06/30/2016 5  5 - 15 Final  . WBC 06/30/2016 2.4* 3.6 - 11.0 K/uL Final  . RBC 06/30/2016 3.14* 3.80 - 5.20 MIL/uL Final  . Hemoglobin 06/30/2016 10.5* 12.0 - 16.0 g/dL Final  . HCT 06/30/2016 30.8* 35.0 - 47.0 % Final  . MCV 06/30/2016 98.0  80.0 - 100.0 fL Final  . MCH 06/30/2016 33.6  26.0 - 34.0 pg Final  . MCHC 06/30/2016 34.3  32.0 - 36.0 g/dL Final  . RDW 06/30/2016 20.4* 11.5 - 14.5 % Final  . Platelets 06/30/2016 230  150 - 440 K/uL Final  . Neutrophils Relative % 06/30/2016 62  % Final  . Neutro Abs 06/30/2016 1.5  1.4 - 6.5 K/uL Final  . Lymphocytes Relative 06/30/2016 18  % Final  . Lymphs Abs 06/30/2016 0.4* 1.0 - 3.6 K/uL Final  . Monocytes Relative 06/30/2016 14  % Final  . Monocytes Absolute 06/30/2016 0.3  0.2 - 0.9 K/uL Final  . Eosinophils Relative 06/30/2016 4  % Final  . Eosinophils Absolute 06/30/2016 0.1  0 - 0.7 K/uL Final  . Basophils Relative 06/30/2016 2  % Final  . Basophils Absolute 06/30/2016 0.0  0 - 0.1 K/uL Final    Assessment:  Melinda Tanner is a 46 y.o. 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  She initially presented in 2009 while living in [place] with multi-focal right breast cancer with positive lymph node(s) which was ER+, PR+(low), and HER2/neu+. She received neoadjuvant chemotherapy (AC x 4 every 3  weeks followed by Taxol/Abraxane + carboplatin weekly x 12) without anti-HER2 treatment.    On 07/14/2008, she underwent bilateral mastectomies followed by reconstruction.  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   Zero of 11 lymph nodes on the right were positive for malignancy. 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 Deep margin was negative. Zero of 2 lymph nodes were positive for metastatic disease.  She received adjuvant radiation to the right breast.  She received adjuvant tamoxifen and Zoladex.  She could not tolerate symptoms of joint pain and tamoxifen was discontinued after 1-2 months.  Zoladex was continued for approximately 1.5 years.  She was diagnosed with brain metastases in 02/2011.  Pathology revealed ER+, PR+(low), and HER2/neu-.  She underwent resection followed by [facility].  On 03/09/2011, original breast tissue (09/03/2007) sent to [facility] testing.  Testing revealed ER+, PR+(borderline), and HER2/neu positive (different than initial testing).  She received Herceptin for 1 year beginning in 2013.  She then began Tykerb and Xeloda after completion of Herceptin (2014).  She was on extremely low, and probably sub-therapeutic, dosing of lapatinib + capecitabine.   She developed right supraclavicular adenopathy in 2015. She was noted to have slow disease progression in the right frontal/parietal lesion with minimal presence of systemic disease on PET scans. Capecitabine + lapatinib were discontinued in 04/2014.   She underwent repeat craniotomy with resection of brain metastasis on 09/04/2014.  Biopsy of the right supraclavicular lymph node  was ER+,PR+,HER2/neu-.  She started tamoxifen in 09/2014, but discontinued it secondary to pain in hip, back, and shoulder.  She restarted tamoxifen on 04/23/2015.  Tamoxifen was discontinued on 08/23/2015 secondary to progressive disease.  She received Zoladex on 04/30/2015.  She  underwent laparoscopic bilateral oophorectomy on 05/18/2015.  She receives monthly Xgeva (began 04/30/2015; last 05/30/2016).  She is post-menopausal.  She began Faslodex on 08/23/2015 (last 04/04/2016).  She is s/p 7 cycles of Ibrance (09/20/2015 - 06/18/2016).  Patient received palliative radiation 30 Gy to the left humerus and lumbar spine from 08/25/2015 - 09/09/2015  CA27.29 has been followed: 177.8 on 04/16/2015, 276.4 on 05/28/2015, 287.5 on 07/13/2015, 333.8 on 07/26/2015, 412.9 on 08/23/2015, 315.6 on 09/20/2015, 188.8 on 10/18/2015, 151.5 on 11/15/2015, 122.7 on 12/10/2015, 94.6 on 01/10/2016, 80.9 on 02/07/2016, 79 on 03/06/2016,  84.8 on 04/04/2016, 85.2 on 05/02/2016, 91.1 on 05/30/2016, and 121 on 06/30/2016.  Bone scan on 05/23/2016 revealed a stable focus of abnormal uptake seen in right frontal skull consistent with prior craniotomy.  There were areas of abnormal uptake identified in the left humeral head, T4 and S1 levels and right iliac crest that were present but decreased in intensity compared to prior exam suggesting possible improvement.  There was a new foci of abnormal uptake are noted in a right lower rib,T12 and L1 levels of spine concerning for metastatic disease  Thoracic and lumbar spine MRI on 06/07/2016 revealed multiple low T1/T2 weighted signal, non enhancing lesions within the thoracic spine, which corresponded in size and location to sclerotic lesions demonstrated on the PET CT of 02/18/2016.  There were no new thoracic lesions.  There was minimal residual contrast enhancement within the L5 vertebral body metastatic lesion, decreased compared to the MRI of 08/12/2015.  There was minimal contrast enhancement at the periphery of the lesion within the L1 vertebral body. This lesion was new compared to MRI of 08/12/2015, but unchanged in size and location compared to PET CT of 02/18/2016.  There was no epidural disease, spinal canal stenosis or neural foraminal  encroachment.  Head MRI on 06/13/2016 revealed no evidence of residual or recurrent disease.  Bone density study on 05/24/2015 revealed a T-score of -0.8 in the right femoral neck (normal).  She has a normocytic anemia due to treatment Melinda Tanner, radiation) and B12 deficiency.  Work-up on 10/25/2015 revealed a B12 of 141 (low).  B12 was 205 on 12/13/2015 with an MMA of 171 (normal) on 01/10/2016.  Ferritin was 43. Iron studies included a saturation of 13% and a TIBC of 354. TSH and folate were normal.  Reticulocyte count was 2.2%.  She declined B12 injections.  She started oral B12 1000 mcg on 11/05/2015.  She takes her B12 sporadically.  B12 level was 205 (low normal) on 12/13/2015.  Diet is good.  She denies any melena or hematochezia.  She began palliative radiation to T12-L1 of 3000 cGy on 06/21/2016.  Symptomatically, is feeling better since initiation of radiation.  Counts are low s/p last cycle of Ibrance.  Plan: 1.  Labs today:  CBC with diff, CMP, CA27.29. 2.  Review head MRI.  No evidence of recurrent disease. 3.  Continue palliative radiation. 4.  Xgeva today. 5.  Discuss discontinuation of Faslodex and Ibrance. 6.  Review plan for chemotherapy after radiation.  Discuss possible protocol enrollment.  Melinda Tanner to meet with patient today. 7.  Discuss new baseline PET scan.  Patient would like to postpone imaging until after Mother's Day. 8.  Schedule PET scan on 07/31/2016. 9.  RTC in 2 weeks for MD assessment and labs (CBC with diff, BMP).   Lequita Asal, MD  06/30/2016, 9:43 AM

## 2016-06-30 NOTE — Progress Notes (Signed)
Patient offers no complaints today.  Wants to finish radiation and rest for a week before starting clinical trial.  Patient wants to know if MD wants her to resume taking Ibrance or if there is a different plan?

## 2016-07-01 LAB — CANCER ANTIGEN 27.29: CA 27.29: 121.5 U/mL — ABNORMAL HIGH (ref 0.0–38.6)

## 2016-07-03 ENCOUNTER — Ambulatory Visit
Admission: RE | Admit: 2016-07-03 | Discharge: 2016-07-03 | Disposition: A | Discharge status: Home / Self Care | Payer: Medicare HMO | Source: Ambulatory Visit | Attending: Radiation Oncology | Admitting: Radiation Oncology

## 2016-07-03 DIAGNOSIS — C50911 Malignant neoplasm of unspecified site of right female breast: Secondary | ICD-10-CM | POA: Diagnosis not present

## 2016-07-03 DIAGNOSIS — Z510 Encounter for antineoplastic radiation therapy: Secondary | ICD-10-CM | POA: Diagnosis not present

## 2016-07-03 DIAGNOSIS — M545 Low back pain: Secondary | ICD-10-CM | POA: Diagnosis not present

## 2016-07-03 DIAGNOSIS — C7931 Secondary malignant neoplasm of brain: Secondary | ICD-10-CM | POA: Diagnosis not present

## 2016-07-03 DIAGNOSIS — Z170 Estrogen receptor positive status [ER+]: Secondary | ICD-10-CM | POA: Diagnosis not present

## 2016-07-03 DIAGNOSIS — C7951 Secondary malignant neoplasm of bone: Secondary | ICD-10-CM | POA: Diagnosis not present

## 2016-07-03 DIAGNOSIS — Z9013 Acquired absence of bilateral breasts and nipples: Secondary | ICD-10-CM | POA: Diagnosis not present

## 2016-07-03 DIAGNOSIS — Z923 Personal history of irradiation: Secondary | ICD-10-CM | POA: Diagnosis not present

## 2016-07-04 ENCOUNTER — Ambulatory Visit
Admission: RE | Admit: 2016-07-04 | Discharge: 2016-07-04 | Disposition: A | Discharge status: Home / Self Care | Payer: Medicare HMO | Source: Ambulatory Visit | Attending: Radiation Oncology | Admitting: Radiation Oncology

## 2016-07-04 DIAGNOSIS — C50919 Malignant neoplasm of unspecified site of unspecified female breast: Secondary | ICD-10-CM | POA: Diagnosis not present

## 2016-07-04 DIAGNOSIS — Z170 Estrogen receptor positive status [ER+]: Secondary | ICD-10-CM | POA: Diagnosis not present

## 2016-07-04 DIAGNOSIS — Z9013 Acquired absence of bilateral breasts and nipples: Secondary | ICD-10-CM | POA: Diagnosis not present

## 2016-07-04 DIAGNOSIS — M545 Low back pain: Secondary | ICD-10-CM | POA: Diagnosis not present

## 2016-07-04 DIAGNOSIS — C50911 Malignant neoplasm of unspecified site of right female breast: Secondary | ICD-10-CM | POA: Diagnosis not present

## 2016-07-04 DIAGNOSIS — C7951 Secondary malignant neoplasm of bone: Secondary | ICD-10-CM | POA: Diagnosis not present

## 2016-07-04 DIAGNOSIS — Z923 Personal history of irradiation: Secondary | ICD-10-CM | POA: Diagnosis not present

## 2016-07-04 DIAGNOSIS — C7931 Secondary malignant neoplasm of brain: Secondary | ICD-10-CM | POA: Diagnosis not present

## 2016-07-04 DIAGNOSIS — Z510 Encounter for antineoplastic radiation therapy: Secondary | ICD-10-CM | POA: Diagnosis not present

## 2016-07-14 ENCOUNTER — Encounter: Payer: Self-pay | Admitting: Hematology and Oncology

## 2016-07-14 ENCOUNTER — Inpatient Hospital Stay (HOSPITAL_BASED_OUTPATIENT_CLINIC_OR_DEPARTMENT_OTHER): Payer: Medicare HMO | Admitting: Hematology and Oncology

## 2016-07-14 ENCOUNTER — Inpatient Hospital Stay: Payer: Medicare HMO

## 2016-07-14 VITALS — BP 118/86 | HR 94 | Temp 99.0°F | Wt 156.9 lb

## 2016-07-14 DIAGNOSIS — E538 Deficiency of other specified B group vitamins: Secondary | ICD-10-CM | POA: Diagnosis not present

## 2016-07-14 DIAGNOSIS — T451X5A Adverse effect of antineoplastic and immunosuppressive drugs, initial encounter: Secondary | ICD-10-CM

## 2016-07-14 DIAGNOSIS — C7951 Secondary malignant neoplasm of bone: Secondary | ICD-10-CM | POA: Diagnosis not present

## 2016-07-14 DIAGNOSIS — Z8541 Personal history of malignant neoplasm of cervix uteri: Secondary | ICD-10-CM

## 2016-07-14 DIAGNOSIS — Z9013 Acquired absence of bilateral breasts and nipples: Secondary | ICD-10-CM

## 2016-07-14 DIAGNOSIS — Z170 Estrogen receptor positive status [ER+]: Secondary | ICD-10-CM | POA: Diagnosis not present

## 2016-07-14 DIAGNOSIS — Z9221 Personal history of antineoplastic chemotherapy: Secondary | ICD-10-CM

## 2016-07-14 DIAGNOSIS — G893 Neoplasm related pain (acute) (chronic): Secondary | ICD-10-CM

## 2016-07-14 DIAGNOSIS — D649 Anemia, unspecified: Secondary | ICD-10-CM

## 2016-07-14 DIAGNOSIS — Z923 Personal history of irradiation: Secondary | ICD-10-CM | POA: Diagnosis not present

## 2016-07-14 DIAGNOSIS — D701 Agranulocytosis secondary to cancer chemotherapy: Secondary | ICD-10-CM

## 2016-07-14 DIAGNOSIS — C50911 Malignant neoplasm of unspecified site of right female breast: Secondary | ICD-10-CM

## 2016-07-14 DIAGNOSIS — C7931 Secondary malignant neoplasm of brain: Secondary | ICD-10-CM

## 2016-07-14 LAB — BASIC METABOLIC PANEL
Anion gap: 7 (ref 5–15)
BUN: 9 mg/dL (ref 6–20)
CO2: 27 mmol/L (ref 22–32)
Calcium: 9.4 mg/dL (ref 8.9–10.3)
Chloride: 103 mmol/L (ref 101–111)
Creatinine, Ser: 0.62 mg/dL (ref 0.44–1.00)
GFR calc Af Amer: >60 mL/min (ref >60)
GFR calc non Af Amer: >60 mL/min (ref >60)
Glucose, Bld: 103 mg/dL — ABNORMAL HIGH (ref 65–99)
Potassium: 4.2 mmol/L (ref 3.5–5.1)
Sodium: 137 mmol/L (ref 135–145)

## 2016-07-14 LAB — CBC WITH DIFFERENTIAL/PLATELET
Basophils Absolute: 0.1 10*3/uL (ref 0–0.1)
Basophils Relative: 1 %
Eosinophils Absolute: 0.2 10*3/uL (ref 0–0.7)
Eosinophils Relative: 4 %
HCT: 34.7 % — ABNORMAL LOW (ref 35.0–47.0)
Hemoglobin: 11.9 g/dL — ABNORMAL LOW (ref 12.0–16.0)
Lymphocytes Relative: 7 %
Lymphs Abs: 0.4 10*3/uL — ABNORMAL LOW (ref 1.0–3.6)
MCH: 33.3 pg (ref 26.0–34.0)
MCHC: 34.3 g/dL (ref 32.0–36.0)
MCV: 97.2 fL (ref 80.0–100.0)
Monocytes Absolute: 0.6 10*3/uL (ref 0.2–0.9)
Monocytes Relative: 10 %
Neutro Abs: 4.7 10*3/uL (ref 1.4–6.5)
Neutrophils Relative %: 78 %
Platelets: 307 10*3/uL (ref 150–440)
RBC: 3.57 MIL/uL — ABNORMAL LOW (ref 3.80–5.20)
RDW: 19.3 % — ABNORMAL HIGH (ref 11.5–14.5)
WBC: 6 10*3/uL (ref 3.6–11.0)

## 2016-07-14 LAB — IRON AND TIBC
Iron: 41 ug/dL (ref 28–170)
Saturation Ratios: 11 % (ref 10.4–31.8)
TIBC: 371 ug/dL (ref 250–450)
UIBC: 330 ug/dL

## 2016-07-14 LAB — FERRITIN: Ferritin: 38 ng/mL (ref 11–307)

## 2016-07-14 LAB — VITAMIN B12: Vitamin B-12: 449 pg/mL (ref 180–914)

## 2016-07-14 MED ORDER — PANTOPRAZOLE SODIUM 20 MG PO TBEC: DELAYED_RELEASE_TABLET | ORAL | 3 refills | Status: DC

## 2016-07-14 NOTE — Progress Notes (Signed)
Patient denies pain or discomfort at this time, but states she had some stomach pain and vomiting at night, also right sided stomach cramping.

## 2016-07-14 NOTE — Progress Notes (Signed)
[facility] day:  07/14/16   Chief Complaint: Melinda Tanner is a 46 y.o. 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   HPI:  The patient was last seen in the medical oncology clinic on 06/30/2016.  At that time, she was in the midst of palliative radiation.  Pain had dramatically improved.  We discussed plans for restaging PET scan.  She wished to postpone imaging until after Mother's Day.  She received Xgeva.  She completed radiation on 07/04/2016.  During the interim, she has had some stomach aches.  She needs a refill of her Protonix.  She is using ibuprofen for pain.  She notes discomfort in her sacrum, coccyx, and left hip.   Past Medical History:  Diagnosis Date  . Brain cancer ([place]) 2012   Met. from Breast  . Breast cancer ([place]) 2009  . Complication of anesthesia    nausea, "drops in potassium and magnesium"  . Seizures ([place])     Past Surgical History:  Procedure Laterality Date  . BRAIN SURGERY  2012, 2106  . CESAREAN SECTION    . LAPAROSCOPIC BILATERAL SALPINGO OOPHERECTOMY Bilateral 05/18/2015   Procedure: LAPAROSCOPIC BILATERAL SALPINGO OOPHORECTOMY;  Surgeon: Will Bonnet, MD;  Location: [facility] ORS;  Service: Gynecology;  Laterality: Bilateral;  . MASTECTOMY Bilateral 2010    Family History  Problem Relation Age of Onset  . Cancer Paternal Aunt   . Cancer Paternal Uncle   . Cancer Paternal Grandfather   . Hypertension Brother   . Diabetes Paternal Grandmother   A paternal aunt had breast cancer age 74, a paternal uncle had prostate cancer, and a paternal grandfather had leukemia.  Social History:  reports that she has never smoked. She has never used smokeless tobacco. She reports that she does not drink alcohol or use drugs.  She is from [place].  She moved to [place] in 2015.  She moved to [place] in 04/2014.  She recently moved into the  [place] area.  She has 2 children (boy and girl).  Her family will likely be moving to [facility] in December.  They are looking for a home.  The patient is accompanied by the Spanish interpreter today.  Allergies:  Allergies  Allergen Reactions  . Taxol [Paclitaxel] Anaphylaxis  . Aspirin Swelling  . Penicillins Swelling  . Zofran [Ondansetron Hcl] Nausea And Vomiting    Current Medications: Current Outpatient Prescriptions  Medication Sig Dispense Refill  . HYDROcodone-acetaminophen (NORCO/VICODIN) 5-325 MG tablet Take 1-2 tablets by mouth every 4 (four) hours as needed for moderate pain. 30 tablet 0  . ibuprofen (ADVIL,MOTRIN) 800 MG tablet Take 800 mg by mouth every 8 (eight) hours as needed (Takes 1/2 tablet prn).    Marland Kitchen omeprazole (PRILOSEC) 20 MG capsule Take 20 mg by mouth daily.  2  . palbociclib (IBRANCE) 100 MG capsule Take 1 capsule daily for 21 days and then take 1 week off and repeat cycle again (Take whole with food.) 21 capsule 1  . pantoprazole (PROTONIX) 20 MG tablet Take 1 tablet by mouth daily 30 tablet 3  . Cyanocobalamin (VITAMIN B-12 PO) Take by mouth.    . docusate sodium (COLACE) 100 MG capsule Take 1 tablet once or twice daily as needed for constipation while taking narcotic pain medicine (Patient not taking: Reported on 07/14/2016) 30 capsule 0  . oxycodone (OXY-IR) 5 MG capsule Take 1 capsule (  5 mg total) by mouth every 4 (four) hours as needed. (Patient not taking: Reported on 06/13/2016) 30 capsule 0  . potassium chloride SA (K-DUR,KLOR-CON) 20 MEQ tablet Take 1 tablet (20 mEq total) by mouth 2 (two) times daily. for 1 days then 1 pill a day x 2 days. (Patient not taking: Reported on 07/14/2016) 10 tablet 0   No current facility-administered medications for this visit.     Review of Systems:  GENERAL:  Feels "ok".  No fevers or sweats.  Weight down 2 pounds. PERFORMANCE STATUS (ECOG):  1 HEENT:  No visual changes, runny nose, sore throat, mouth sores or  tenderness. Lungs: No shortness of breath or cough.  No hemoptysis. Cardiac:  No chest pain, palpitations, orthopnea, or PND. GI:  Epigastric discomfort.  Emesis last night.  No diarrhea, constipation, melena or hematochezia.  GU:  No urgency, frequency, dysuria or hematuria. Musculoskeletal: Sacrum, coccyx, and left hip pain.  No muscle tenderness. Extremities:  No pain or swelling. Skin:  No rashes or irritation. Neuro:  No headache, numbness or weakness, balance or coordination issues.  Endocrine:  No diabetes, thyroid issues, hot flashes or night sweats. Psych:  No mood changes, depression or anxiety. Pain:  Sacrum, coccyx, and left hip pain. Review of systems:  All other systems reviewed and found to be negative.  Physical Exam: Blood pressure 118/86, pulse 94, temperature 99 F (37.2 C), temperature source Tympanic, weight 156 lb 14.4 oz (71.2 kg), last menstrual period 03/19/2015. GENERAL:  Well developed, well nourished, woman sitting comfortably in the exam room in no acute distress.  MENTAL STATUS:  Alert and oriented to person, place and time. HEAD: Wearing a brown cap.  Black short hair.  Normocephalic, atraumatic, face symmetric, no Cushingoid features. EYES:  Brown eyes. Pupils equal round and reactive to light and accomodation.  No conjunctivitis or scleral icterus. ENT:  Oropharynx clear without lesion.  Tongue normal. Mucous membranes moist.  RESPIRATORY:  Clear to auscultation without rales, wheezes or rhonchi. CARDIOVASCULAR:  Regular rate and rhythm without murmur, rub or gallop. ABDOMEN:  Soft, non-tender, with active bowel sounds, and no hepatosplenomegaly.  No masses. SKIN:  No rashes, bruises or ulcers. EXTREMITIES:  No edema, no skin discoloration or tenderness.  No palpable cords. LYMPH NODES:  No palpable cervical, supraclavicular, axillary or inguinal adenopathy  NEUROLOGICAL:  Appropriate. PSYCH:  Appropriate.     Pathology: 09/03/2007: Right breast core  biopsy: infiltrating duct cell carcinoma high grade ER/PR (reportedly positive) Her2 (?) 10/02/2007: Right axillary node core biopsy: Fragment with metastatic carcinoma 10/23/2007: Left breast core biopsy: DCIS high grade ER/PR (?) 07/13/2008: Right mastectomy: Invasive ductal carcinoma Grade III Nottingham score = tubules 3+ Nuclei 3+ mitoses 2+) 1.7 cm size. No lymph invasion. Tumor cellularity 20%. [id number]. 07/13/2008: Left mastectomy: Residual ductal carcinoma in situ, high nuclear grade, deep margin negative ypTisNO(sn)MX. 10/25/2010: Abdominal and pelvic ultrasound: questionable lesion near gallbladder fossa recommend MRI follow up. 03/02/2011: Left and right frontal excisional brain biopsy: Adenocarcinoma, metastatic, NOS: ER +, PR 5% +, Her 2 NEG. 03/04/2011: [facility] testing of IHC4 residual recurrence risk score of 114 showing an 8 year recurrence reate of 57%. ER POSTIVE, PR POSITIVE, HER 2 POSTIVE (previously documented negative in 2009) cutoff of 361 patient scores 426. ki67 71%.  08/05/2013: Right breast FNA supraclavicular region: positive for metastatic carcinoma. ER/PR/HER2 not reported. 08/19/2014: Right cervical lymph node. Adenocarcinoma with features consistent with metastatic breast carcinoma. ER/PR/HER2 insufficient tissue. 09/04/2014: Repeat craniotomy, resection of right frontal  tumor (metastatic breast cancer). ER 90-100% PR 51-60% HER2 - 09/29/2014: Right cervical lymph node, metastatic carcinoma c/w breast primary, ER 100% PR 20% HER2-  Imaging: 08/15/2007: Bilateral Mammogram: Dense breasts with solid lesion at 6:00, 7:00, and 9:00 of right breast, solid lesion at 2:00 position left breast BIRADS 4B. 09/10/2007: Breast MRI: Three solid irregular enhancing lesions of the right breast 2.1 x 3.2, 2.8 x 2.7, and 1.8 x 1.6 cm. Mildly enlarged enhancing nodule right axilla. Left breast clumped enhancement midportion suspicious for malignancy. 03/01/2011: Brain MRI: At least  two juxtacortical, intra-axial masses with extensive surrounding vasogenic edema most consistent with intracranial metastasis. 07/22/2013: Brain MRI +/- contrast: interval increase in enhancing component on T2 FLAIR now measuring 1.0cm x 1.1 cm worrisome for progression of disease. 01/21/2014:  PET scan: Hypermetabolic small right supraclavicular and right upper paratracheal lymph nodes suspicious for mets. Hypermetabolic lymph node in the right paratracheal upper mediastinum that measure approximately 0.9 x 0.5cm. (of note no impressions made of areas noted on brain MRI 07/22/13).  01/21/2014: Head MRI: Right frontal enhancing lesion has shown interval increase in size with surrounding edema and local mass effect (measured 1.8x2.1x1.6, previously 1.2x1.1x1.0) 07/16/2014: Head MRI:  Right anterior frontal enhancing mass 2.5 x 2.0 cm compatible with a metastatic lesion has increased and changed in configuration from previous 2.0 x 1.8 cm, formerly multilobular. 08/06/2014: PET scan:  Multiple bilateral FDG avid low cervical, supraclavicular, upper mediastinal, and right internal mammary lymphadenopathy, likely representing metastatic disease. A cervical node in the right lower neck should be amenable to percutaneous sampling.  12/2014: Head MRI:  Evolution of right frontal postoperative changes status post tumor resection at the vertex. Expected postoperative changes. Minimal marginal enhancement resection bed with some adjacent posterior intrinsic T1 signal again seen. Decreasing T2/FLAIR adjacent parenchymal changes.  Stable resection cavity is left posterior frontal and right parieto-occipital regions. 04/20/2015 : PET scan: Cervical and thoracic nodal metastasis.  There was multifocal osseous metastasis (left transverse T4 process, left humeral head, right iliac wing, and left side of the sacrum).  Incidental findings included right nephrolithiasis.   04/22/2015: Bone scan: Corresponded with PET-CT with  metastatic lesions evident in the left humeral head, left posterior aspect of T4, left aspect of S1, and the right iliac crest.  There was no other definite foci of metastatic disease to bone.  Uptake in the skull was most suggestive of the previous right frontal craniotomy. 05/24/2015: Bone density:  T-score of -0.8 in the right femoral neck (normal). 07/07/2015:  Head MRI:  Metastatic breast cancer with 3 resection cavities. The anterior right frontal cavity was positive for nodular marginal enhancement, new from brain MRI report 01/07/2015, and consistent with recurrent disease.  08/12/2015:  Lumbar Spine MRI: Osseous metastatic disease at L3, L4, L5, S1, and in the left iliac bone posteriorly. There was no definite epidural metastatic disease. 08/20/2015:  PET scan:  Response to therapy in the lower cervical and thoracic adenopathy.  There was a mixed response to multifocal osseous metastases (some new lesions).  12/06/2015:  Head MRI:  Decreased nodular contrast enhancement at the right frontal lobe treatment site.  There was unchanged appearance of treatment sites within the bilateral parietal lobe size without residual or recurrent disease.  There were no new metastatic lesions. 02/18/2016: PET scan: Mixed response to therapy. There hadbeen improvement in right cervical lymphadenopathy and in several osseous lesions. There wereseveral other osseous lesions in the pelvis with increasing hypermetabolism compared to the prior study. As the  majority of the osseous lesions wereincreasingly sclerotic, some of this hypermetabolism could simply reflect bony healing. There was no new extraskeletal metastatic disease is noted in the neck, chest, abdomen or pelvis. 03/06/2016: Head MRI: Unchanged small focus of nodular enhancement at the right frontal resection site. There was unchanged appearance of the 2 other resection sites without abnormal enhancement. There was no evidence of new intracranial  metastases. 05/23/2016:  Bone scan:  Stable focus of abnormal uptake seen in right frontal skull consistent with prior craniotomy.  There were areas of abnormal uptake identified in the left humeral head, T4 and S1 levels and right iliac crest that were present but decreased in intensity compared to prior exam suggesting improvement.  There was a new foci of abnormal uptake are noted in a right lower rib,T12 and L1 levels of spine concerning for metastatic disease. 06/07/2016:  Thoracic and lumbar spine MRI:  multiple low T1/T2 weighted signal, non enhancing lesions within the thoracic spine, which corresponded in size and location to sclerotic lesions demonstrated on the PET CT of 02/18/2016.  There were no new thoracic lesions.  There was minimal residual contrast enhancement within the L5 vertebral body metastatic lesion, decreased compared to the MRI of 08/12/2015.  There was minimal contrast enhancement at the periphery of the lesion within the L1 vertebral body. This lesion was new compared to MRI of 08/12/2015, but unchanged in size and location compared to PET CT of 02/18/2016.  There was no epidural disease, spinal canal stenosis or neural foraminal encroachment. 06/13/2016:  Head MRI revealed no evidence of residual or recurrent disease.   Labs:  Appointment on 07/14/2016  Component Date Value Ref Range Status  . Sodium 07/14/2016 137  135 - 145 mmol/L Final  . Potassium 07/14/2016 4.2  3.5 - 5.1 mmol/L Final  . Chloride 07/14/2016 103  101 - 111 mmol/L Final  . CO2 07/14/2016 27  22 - 32 mmol/L Final  . Glucose, Bld 07/14/2016 103* 65 - 99 mg/dL Final  . BUN 07/14/2016 9  6 - 20 mg/dL Final  . Creatinine, Ser 07/14/2016 0.62  0.44 - 1.00 mg/dL Final  . Calcium 07/14/2016 9.4  8.9 - 10.3 mg/dL Final  . GFR calc non Af Amer 07/14/2016 >60  >60 mL/min Final  . GFR calc Af Amer 07/14/2016 >60  >60 mL/min Final   Comment: (NOTE) The eGFR has been calculated using the CKD EPI equation. This  calculation has not been validated in all clinical situations. eGFR's persistently <60 mL/min signify possible Chronic Kidney Disease.   . Anion gap 07/14/2016 7  5 - 15 Final  . WBC 07/14/2016 6.0  3.6 - 11.0 K/uL Final  . RBC 07/14/2016 3.57* 3.80 - 5.20 MIL/uL Final  . Hemoglobin 07/14/2016 11.9* 12.0 - 16.0 g/dL Final  . HCT 07/14/2016 34.7* 35.0 - 47.0 % Final  . MCV 07/14/2016 97.2  80.0 - 100.0 fL Final  . MCH 07/14/2016 33.3  26.0 - 34.0 pg Final  . MCHC 07/14/2016 34.3  32.0 - 36.0 g/dL Final  . RDW 07/14/2016 19.3* 11.5 - 14.5 % Final  . Platelets 07/14/2016 307  150 - 440 K/uL Final  . Neutrophils Relative % 07/14/2016 78  % Final  . Neutro Abs 07/14/2016 4.7  1.4 - 6.5 K/uL Final  . Lymphocytes Relative 07/14/2016 7  % Final  . Lymphs Abs 07/14/2016 0.4* 1.0 - 3.6 K/uL Final  . Monocytes Relative 07/14/2016 10  % Final  . Monocytes Absolute 07/14/2016 0.6  0.2 - 0.9 K/uL Final  . Eosinophils Relative 07/14/2016 4  % Final  . Eosinophils Absolute 07/14/2016 0.2  0 - 0.7 K/uL Final  . Basophils Relative 07/14/2016 1  % Final  . Basophils Absolute 07/14/2016 0.1  0 - 0.1 K/uL Final  . Ferritin 07/14/2016 38  11 - 307 ng/mL Final  . Iron 07/14/2016 41  28 - 170 ug/dL Final  . TIBC 07/14/2016 371  250 - 450 ug/dL Final  . Saturation Ratios 07/14/2016 11  10.4 - 31.8 % Final  . UIBC 07/14/2016 330  ug/dL Final    Assessment:  Melinda Tanner is a 46 y.o. 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  She initially presented in 2009 while living in [place] with multi-focal right breast cancer with positive lymph node(s) which was ER+, PR+(low), and HER2/neu+. She received neoadjuvant chemotherapy (AC x 4 every 3 weeks followed by Taxol/Abraxane + carboplatin weekly x 12) without anti-HER2 treatment.   On 07/14/2008, she underwent bilateral mastectomies followed by reconstruction.  Pathology in the right breast revealed a 1.7 cm grade III invasive  ductal carcinoma.   Zero of 11 lymph nodes on the right were positive for malignancy. 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 Deep margin was negative. Zero of 2 lymph nodes were positive for metastatic disease.  She received adjuvant radiation to the right breast.  She received adjuvant tamoxifen and Zoladex.  She could not tolerate symptoms of joint pain and tamoxifen was discontinued after 1-2 months.  Zoladex was continued for approximately 1.5 years.  She was diagnosed with brain metastases in 02/2011.  Pathology revealed ER+, PR+(low), and HER2/neu-.  She underwent resection followed by [facility].  On 03/09/2011, original breast tissue (09/03/2007) sent to [facility] testing.  Testing revealed ER+, PR+(borderline), and HER2/neu positive (different than initial testing).  She received Herceptin for 1 year beginning in 2013.  She then began Tykerb and Xeloda after completion of Herceptin (2014).  She was on extremely low, and probably sub-therapeutic, dosing of lapatinib + capecitabine.   She developed right supraclavicular adenopathy in 2015. She was noted to have slow disease progression in the right frontal/parietal lesion with minimal presence of systemic disease on PET scans. Capecitabine + lapatinib were discontinued in 04/2014.   She underwent repeat craniotomy with resection of brain metastasis on 09/04/2014.  Biopsy of the right supraclavicular lymph node  was ER+,PR+,HER2/neu-.  She started tamoxifen in 09/2014, but discontinued it secondary to pain in hip, back, and shoulder.  She restarted tamoxifen on 04/23/2015.  Tamoxifen was discontinued on 08/23/2015 secondary to progressive disease.  She received Zoladex on 04/30/2015.  She underwent laparoscopic bilateral oophorectomy on 05/18/2015.  She receives monthly Xgeva (began 04/30/2015; last 05/30/2016).  She is post-menopausal.  She began Faslodex on 08/23/2015 (last 04/04/2016).  She received 7 cycles of Ibrance  (09/20/2015 - 06/18/2016).  Patient received palliative radiation 30 Gy to the left humerus and lumbar spine from 08/25/2015 - 09/09/2015  CA27.29 has been followed: 177.8 on 04/16/2015, 276.4 on 05/28/2015, 287.5 on 07/13/2015, 333.8 on 07/26/2015, 412.9 on 08/23/2015, 315.6 on 09/20/2015, 188.8 on 10/18/2015, 151.5 on 11/15/2015, 122.7 on 12/10/2015, 94.6 on 01/10/2016, 80.9 on 02/07/2016, 79 on 03/06/2016, 84.8 on 04/04/2016, 85.2 on 05/02/2016, 91.1 on 05/30/2016, and 121 on 06/30/2016.  Bone scan on 05/23/2016 revealed a stable focus of abnormal uptake seen in right frontal skull consistent with prior craniotomy.  There were areas of abnormal uptake identified in the  left humeral head, T4 and S1 levels and right iliac crest that were present but decreased in intensity compared to prior exam suggesting possible improvement.  There was a new foci of abnormal uptake are noted in a right lower rib,T12 and L1 levels of spine concerning for metastatic disease  Head MRI on 06/13/2016 revealed no evidence of residual or recurrent disease.  Bone density study on 05/24/2015 revealed a T-score of -0.8 in the right femoral neck (normal).  She completed palliative radiation to T12-L1 of 3000 cGy (06/21/2016 - 07/04/2016).  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  Work-up on 10/25/2015 revealed a B12 of 141 (low).  B12 was 205 on 12/13/2015 with an MMA of 171 (normal) on 01/10/2016.  Ferritin was 43. Iron studies included a saturation of 13% and a TIBC of 354. TSH and folate were normal.  Reticulocyte count was 2.2%.  She declined B12 injections.  She started oral B12 1000 mcg on 11/05/2015.  She takes her B12 sporadically.  B12 level was 205 (low normal) on 12/13/2015.  Diet is good.  She denies any melena or hematochezia.  Symptomatically, pain is well controlled.  She has some mild abdominal discomfort.  She has persistent anemia.  Plan: 1.  Labs today:  CBC  with diff, BMP, ferritin, iron studies, B12. 2.  Discuss patient is ineligible for local trials.  Discuss other chemotherapy options. 3.  Discuss prior Xeloda.  Notes from Dr. Calton Dach suggests possible sub-therapeutic dosing.  Patient states that she has a diary of her medications.  Contact Dr Glo Herring regarding Xeloda data and possible clinical trial at [facility] [phone]).  Patient states took Xeloda in [place] and for a little while in [place]. 4.  Rx:  Protonix. 5.  RTC on 07/21/2016 for labs (BMP) and Xgeva. 6.  RTC on 07/31/2016 for MD assessment, labs (CBC with diff, CMP, Mg, CA27.29), and review PET scan.   Lequita Asal, MD  07/14/2016, 11:54 AM

## 2016-07-21 ENCOUNTER — Inpatient Hospital Stay: Payer: Medicare HMO

## 2016-07-21 ENCOUNTER — Other Ambulatory Visit: Payer: Self-pay | Admitting: *Deleted

## 2016-07-21 ENCOUNTER — Inpatient Hospital Stay: Payer: Medicare HMO | Attending: Hematology and Oncology

## 2016-07-21 DIAGNOSIS — Z85841 Personal history of malignant neoplasm of brain: Secondary | ICD-10-CM | POA: Insufficient documentation

## 2016-07-21 DIAGNOSIS — E538 Deficiency of other specified B group vitamins: Secondary | ICD-10-CM | POA: Insufficient documentation

## 2016-07-21 DIAGNOSIS — Z9221 Personal history of antineoplastic chemotherapy: Secondary | ICD-10-CM | POA: Insufficient documentation

## 2016-07-21 DIAGNOSIS — C50919 Malignant neoplasm of unspecified site of unspecified female breast: Secondary | ICD-10-CM

## 2016-07-21 DIAGNOSIS — C50911 Malignant neoplasm of unspecified site of right female breast: Secondary | ICD-10-CM | POA: Insufficient documentation

## 2016-07-21 DIAGNOSIS — C7951 Secondary malignant neoplasm of bone: Secondary | ICD-10-CM | POA: Diagnosis not present

## 2016-07-21 DIAGNOSIS — Z923 Personal history of irradiation: Secondary | ICD-10-CM | POA: Diagnosis not present

## 2016-07-21 DIAGNOSIS — Z780 Asymptomatic menopausal state: Secondary | ICD-10-CM | POA: Insufficient documentation

## 2016-07-21 DIAGNOSIS — D649 Anemia, unspecified: Secondary | ICD-10-CM | POA: Diagnosis not present

## 2016-07-21 DIAGNOSIS — Z9013 Acquired absence of bilateral breasts and nipples: Secondary | ICD-10-CM | POA: Insufficient documentation

## 2016-07-21 DIAGNOSIS — C7931 Secondary malignant neoplasm of brain: Secondary | ICD-10-CM

## 2016-07-21 DIAGNOSIS — Z170 Estrogen receptor positive status [ER+]: Secondary | ICD-10-CM | POA: Diagnosis not present

## 2016-07-21 LAB — BASIC METABOLIC PANEL
Anion gap: 5 (ref 5–15)
BUN: 14 mg/dL (ref 6–20)
CO2: 28 mmol/L (ref 22–32)
Calcium: 9.4 mg/dL (ref 8.9–10.3)
Chloride: 103 mmol/L (ref 101–111)
Creatinine, Ser: 0.52 mg/dL (ref 0.44–1.00)
GFR calc Af Amer: >60 mL/min (ref >60)
GFR calc non Af Amer: >60 mL/min (ref >60)
Glucose, Bld: 122 mg/dL — ABNORMAL HIGH (ref 65–99)
Potassium: 4.1 mmol/L (ref 3.5–5.1)
Sodium: 136 mmol/L (ref 135–145)

## 2016-07-21 NOTE — Progress Notes (Signed)
Patient is here today for lab/Xgeva.  She last received Xgeva on 06/30/16 which is only 3 weeks ago.  Confirmed with Dr. Mike Gip that patient is NOT to receive injection today but will get on next MD visit on 07/31/16.  Discussed with patient and she is agreeable.

## 2016-07-31 ENCOUNTER — Inpatient Hospital Stay: Payer: Medicare HMO

## 2016-07-31 ENCOUNTER — Other Ambulatory Visit: Payer: Self-pay | Admitting: Hematology and Oncology

## 2016-07-31 ENCOUNTER — Ambulatory Visit: Payer: Medicare HMO

## 2016-07-31 ENCOUNTER — Inpatient Hospital Stay (HOSPITAL_BASED_OUTPATIENT_CLINIC_OR_DEPARTMENT_OTHER): Payer: Medicare HMO | Admitting: Hematology and Oncology

## 2016-07-31 VITALS — BP 114/77 | HR 92 | Temp 97.6°F | Resp 20 | Ht 63.0 in | Wt 156.0 lb

## 2016-07-31 DIAGNOSIS — Z7189 Other specified counseling: Secondary | ICD-10-CM

## 2016-07-31 DIAGNOSIS — C7931 Secondary malignant neoplasm of brain: Secondary | ICD-10-CM

## 2016-07-31 DIAGNOSIS — D649 Anemia, unspecified: Secondary | ICD-10-CM | POA: Diagnosis not present

## 2016-07-31 DIAGNOSIS — C7951 Secondary malignant neoplasm of bone: Secondary | ICD-10-CM

## 2016-07-31 DIAGNOSIS — G893 Neoplasm related pain (acute) (chronic): Secondary | ICD-10-CM

## 2016-07-31 DIAGNOSIS — Z9013 Acquired absence of bilateral breasts and nipples: Secondary | ICD-10-CM

## 2016-07-31 DIAGNOSIS — C50911 Malignant neoplasm of unspecified site of right female breast: Secondary | ICD-10-CM

## 2016-07-31 DIAGNOSIS — Z170 Estrogen receptor positive status [ER+]: Secondary | ICD-10-CM

## 2016-07-31 DIAGNOSIS — C50919 Malignant neoplasm of unspecified site of unspecified female breast: Secondary | ICD-10-CM

## 2016-07-31 DIAGNOSIS — Z85841 Personal history of malignant neoplasm of brain: Secondary | ICD-10-CM | POA: Diagnosis not present

## 2016-07-31 DIAGNOSIS — Z9221 Personal history of antineoplastic chemotherapy: Secondary | ICD-10-CM

## 2016-07-31 DIAGNOSIS — E538 Deficiency of other specified B group vitamins: Secondary | ICD-10-CM

## 2016-07-31 DIAGNOSIS — Z923 Personal history of irradiation: Secondary | ICD-10-CM

## 2016-07-31 LAB — CBC WITH DIFFERENTIAL/PLATELET
Basophils Absolute: 0.1 10*3/uL (ref 0–0.1)
Basophils Relative: 1 %
Eosinophils Absolute: 0.1 10*3/uL (ref 0–0.7)
Eosinophils Relative: 2 %
HCT: 32.3 % — ABNORMAL LOW (ref 35.0–47.0)
Hemoglobin: 11.1 g/dL — ABNORMAL LOW (ref 12.0–16.0)
Lymphocytes Relative: 11 %
Lymphs Abs: 0.7 10*3/uL — ABNORMAL LOW (ref 1.0–3.6)
MCH: 32.8 pg (ref 26.0–34.0)
MCHC: 34.3 g/dL (ref 32.0–36.0)
MCV: 95.6 fL (ref 80.0–100.0)
Monocytes Absolute: 0.6 10*3/uL (ref 0.2–0.9)
Monocytes Relative: 9 %
Neutro Abs: 4.8 10*3/uL (ref 1.4–6.5)
Neutrophils Relative %: 77 %
Platelets: 318 10*3/uL (ref 150–440)
RBC: 3.38 MIL/uL — ABNORMAL LOW (ref 3.80–5.20)
RDW: 17.7 % — ABNORMAL HIGH (ref 11.5–14.5)
WBC: 6.2 10*3/uL (ref 3.6–11.0)

## 2016-07-31 LAB — COMPREHENSIVE METABOLIC PANEL
ALT: 16 U/L (ref 14–54)
AST: 25 U/L (ref 15–41)
Albumin: 3.8 g/dL (ref 3.5–5.0)
Alkaline Phosphatase: 97 U/L (ref 38–126)
Anion gap: 6 (ref 5–15)
BUN: 13 mg/dL (ref 6–20)
CO2: 26 mmol/L (ref 22–32)
Calcium: 8.9 mg/dL (ref 8.9–10.3)
Chloride: 105 mmol/L (ref 101–111)
Creatinine, Ser: 0.55 mg/dL (ref 0.44–1.00)
GFR calc Af Amer: >60 mL/min (ref >60)
GFR calc non Af Amer: >60 mL/min (ref >60)
Glucose, Bld: 101 mg/dL — ABNORMAL HIGH (ref 65–99)
Potassium: 4.3 mmol/L (ref 3.5–5.1)
Sodium: 137 mmol/L (ref 135–145)
Total Bilirubin: 0.6 mg/dL (ref 0.3–1.2)
Total Protein: 7.4 g/dL (ref 6.5–8.1)

## 2016-07-31 MED ORDER — OXYCODONE-ACETAMINOPHEN 5-325 MG PO TABS: 1.0000 | ORAL_TABLET | ORAL | 0 refills | Status: DC | PRN

## 2016-07-31 MED ORDER — DENOSUMAB 120 MG/1.7ML ~~LOC~~ SOLN
120.0000 mg | Freq: Once | SUBCUTANEOUS | Status: AC
  Administered 2016-07-31: 120 mg via SUBCUTANEOUS
  Filled 2016-07-31: qty 1.7

## 2016-07-31 NOTE — Progress Notes (Signed)
[facility] day:  07/31/16   Chief Complaint: Melinda Tanner is a 46 y.o. 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   HPI:  The patient was last seen in the medical oncology clinic on 07/14/2016.  At that time, pain was well controlled.  She had some abdominal discomfort.  We discussed her experience with Xeloda in the past.  She was ineligible for clinical trials.  She wished to postpone PET scan after Mother's Day.  Symptomatically, she notes her pain is poorly controlled. She has increased her use of ibuprofen from 2 to 4 a day.  She states that the oxycodone has made her sleepy and hallucinate in the past.  She has tried tramadol 1 in the past. She is also tried Naprosyn and Tylenol. She notes pain in her left hip and radiates down toward her buttocks. She is unable to sit for any length of time. Pain has been going on for the past week and a half.   Past Medical History:  Diagnosis Date  . Brain cancer ([place]) 2012   Met. from Breast  . Breast cancer ([place]) 2009  . Complication of anesthesia    nausea, "drops in potassium and magnesium"  . Seizures ([place])     Past Surgical History:  Procedure Laterality Date  . BRAIN SURGERY  2012, 2106  . CESAREAN SECTION    . LAPAROSCOPIC BILATERAL SALPINGO OOPHERECTOMY Bilateral 05/18/2015   Procedure: LAPAROSCOPIC BILATERAL SALPINGO OOPHORECTOMY;  Surgeon: Will Bonnet, MD;  Location: [facility] ORS;  Service: Gynecology;  Laterality: Bilateral;  . MASTECTOMY Bilateral 2010    Family History  Problem Relation Age of Onset  . Cancer Paternal Aunt   . Cancer Paternal Uncle   . Cancer Paternal Grandfather   . Hypertension Brother   . Diabetes Paternal Grandmother   A paternal aunt had breast cancer age 69, a paternal uncle had prostate cancer, and a paternal grandfather had leukemia.  Social History:  reports that she has never smoked.  She has never used smokeless tobacco. She reports that she does not drink alcohol or use drugs.  She is from [place].  She moved to [place] in 2015.  She moved to [place] in 04/2014.  She recently moved into the [place] area.  She has 2 children (boy and girl).  Her family will likely be moving to [facility] in December.  They are looking for a home.  The patient is accompanied by her husband, daughter, and the Spanish interpreter today.  Allergies:  Allergies  Allergen Reactions  . Taxol [Paclitaxel] Anaphylaxis  . Aspirin Swelling  . Penicillins Swelling  . Zofran [Ondansetron Hcl] Nausea And Vomiting    Current Medications: Current Outpatient Prescriptions  Medication Sig Dispense Refill  . Cyanocobalamin (VITAMIN B-12 PO) Take by mouth.    . docusate sodium (COLACE) 100 MG capsule Take 1 tablet once or twice daily as needed for constipation while taking narcotic pain medicine 30 capsule 0  . HYDROcodone-acetaminophen (NORCO/VICODIN) 5-325 MG tablet Take 1-2 tablets by mouth every 4 (four) hours as needed for moderate pain. 30 tablet 0  . ibuprofen (ADVIL,MOTRIN) 800 MG tablet Take 800 mg by mouth every 8 (eight) hours as needed (Takes 1/2 tablet prn).    Marland Kitchen omeprazole (PRILOSEC) 20 MG capsule Take 20 mg by mouth daily.  2  . oxycodone (OXY-IR) 5 MG capsule Take 1 capsule (5 mg total)  by mouth every 4 (four) hours as needed. 30 capsule 0  . palbociclib (IBRANCE) 100 MG capsule Take 1 capsule daily for 21 days and then take 1 week off and repeat cycle again (Take whole with food.) 21 capsule 1  . pantoprazole (PROTONIX) 20 MG tablet Take 1 tablet by mouth daily 30 tablet 3  . potassium chloride SA (K-DUR,KLOR-CON) 20 MEQ tablet Take 1 tablet (20 mEq total) by mouth 2 (two) times daily. for 1 days then 1 pill a day x 2 days. 10 tablet 0   No current facility-administered medications for this visit.     Review of Systems:  GENERAL:  Uncomfortable secondary to pain.  No  fevers or sweats.  Weight stable. PERFORMANCE STATUS (ECOG):  1 HEENT:  No visual changes, runny nose, sore throat, mouth sores or tenderness. Lungs: No shortness of breath or cough.  No hemoptysis. Cardiac:  No chest pain, palpitations, orthopnea, or PND. GI:  No nausea, vomiting, diarrhea, constipation, melena or hematochezia.  GU:  No urgency, frequency, dysuria or hematuria. Musculoskeletal: Left hip pain which radiates to buttock.  No muscle tenderness. Extremities:  No pain or swelling. Skin:  No rashes or irritation. Neuro:  No headache, numbness or weakness, balance or coordination issues.  Endocrine:  No diabetes, thyroid issues, hot flashes or night sweats. Psych:  No mood changes, depression or anxiety. Pain:  Left hip pain (10 out of 10). Review of systems:  All other systems reviewed and found to be negative.  Physical Exam: Blood pressure 114/77, pulse 92, temperature 97.6 F (36.4 C), temperature source Tympanic, resp. rate 20, height 5' 3"  (1.6 m), weight 156 lb (70.8 kg), last menstrual period 03/19/2015. GENERAL:  Well developed, well nourished, woman sitting comfortably in the exam room in no acute distress.  MENTAL STATUS:  Alert and oriented to person, place and time. HEAD: Wearing a pink scarf.  Black short hair.  Normocephalic, atraumatic, face symmetric, no Cushingoid features. EYES:  Brown eyes. Pupils equal round and reactive to light and accomodation.  No conjunctivitis or scleral icterus. ENT:  Oropharynx clear without lesion.  Tongue normal. Mucous membranes moist.  RESPIRATORY:  Clear to auscultation without rales, wheezes or rhonchi. CARDIOVASCULAR:  Regular rate and rhythm without murmur, rub or gallop. ABDOMEN:  Soft, non-tender, with active bowel sounds, and no hepatosplenomegaly.  No masses. SKIN:  No rashes, bruises or ulcers. EXTREMITIES:  No edema, no skin discoloration or tenderness.  No palpable cords. LYMPH NODES:  No palpable cervical,  supraclavicular, axillary or inguinal adenopathy  NEUROLOGICAL:  Appropriate. PSYCH:  Appropriate.     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 No lymph invasion. Tumor cellularity 20%. [id number]. 07/13/2008: Left mastectomy: Residual ductal carcinoma in situ, high nuclear grade, deep margin negative ypTisNO(sn)MX. 10/25/2010: Abdominal and pelvic ultrasound: questionable lesion near gallbladder fossa recommend MRI follow up. 03/02/2011: Left and right frontal excisional brain biopsy: Adenocarcinoma, metastatic, NOS: ER +, PR 5% +, Her 2 NEG. 03/04/2011: [facility] testing of IHC4 residual recurrence risk score of 114 showing an 8 year recurrence reate of 57%. ER POSTIVE, PR POSITIVE, HER 2 POSTIVE (previously documented negative in 2009) cutoff of 361 patient scores 426. ki67 71%.  08/05/2013:  Right breast FNA supraclavicular region: positive for metastatic carcinoma. ER/PR/HER2 not reported. 08/19/2014: Right cervical lymph node. Adenocarcinoma with features consistent with metastatic breast carcinoma. ER/PR/HER2 insufficient tissue. 09/04/2014: Repeat craniotomy, resection of right frontal tumor (metastatic breast cancer). ER 90-100% PR 51-60% HER2 - 09/29/2014: Right cervical lymph node, metastatic carcinoma c/w breast primary, ER 100% PR 20% HER2-  Imaging: 08/15/2007: Bilateral Mammogram: Dense breasts with solid lesion at 6:00, 7:00, and 9:00 of right breast, solid lesion at 2:00 position left breast BIRADS 4B. 09/10/2007: Breast MRI: Three solid irregular enhancing lesions of the right breast 2.1 x 3.2, 2.8 x 2.7, and 1.8 x 1.6 cm.  Mildly enlarged enhancing nodule right axilla. Left breast clumped enhancement midportion suspicious for malignancy. 03/01/2011: Brain MRI: At least two juxtacortical, intra-axial masses with extensive surrounding vasogenic edema most consistent with intracranial metastasis. 07/22/2013: Brain MRI +/- contrast: interval increase in enhancing component on T2 FLAIR now measuring 1.0cm x 1.1 cm worrisome for progression of disease. 01/21/2014:  PET scan: Hypermetabolic small right supraclavicular and right upper paratracheal lymph nodes suspicious for mets. Hypermetabolic lymph node in the right paratracheal upper mediastinum that measure approximately 0.9 x 0.5cm. (of note no impressions made of areas noted on brain MRI 07/22/13).  01/21/2014: Head MRI: Right frontal enhancing lesion has shown interval increase in size with surrounding edema and local mass effect (measured 1.8x2.1x1.6, previously 1.2x1.1x1.0) 07/16/2014: Head MRI:  Right anterior frontal enhancing mass 2.5 x 2.0 cm compatible with a metastatic lesion has increased and changed in configuration from previous 2.0 x 1.8 cm, formerly multilobular. 08/06/2014: PET scan:  Multiple bilateral FDG avid low cervical, supraclavicular, upper mediastinal, and right internal mammary lymphadenopathy, likely representing metastatic disease. A cervical node in the right lower neck should be amenable to percutaneous sampling.  12/2014: Head MRI:  Evolution of right frontal postoperative changes status post tumor resection at the vertex. Expected postoperative changes. Minimal marginal enhancement resection bed with some adjacent posterior intrinsic T1 signal again seen. Decreasing T2/FLAIR adjacent parenchymal changes.  Stable resection cavity is left posterior frontal and right parieto-occipital regions. 04/20/2015 : PET scan: Cervical and thoracic nodal metastasis.  There was multifocal osseous metastasis (left transverse T4 process, left humeral head, right  iliac wing, and left side of the sacrum).  Incidental findings included right nephrolithiasis.   04/22/2015: Bone scan: Corresponded with PET-CT with metastatic lesions evident in the left humeral head, left posterior aspect of T4, left aspect of S1, and the right iliac crest.  There was no other definite foci of metastatic disease to bone.  Uptake in the skull was most suggestive of the previous right frontal craniotomy. 05/24/2015: Bone density:  T-score of -0.8 in the right femoral neck (normal). 07/07/2015:  Head MRI:  Metastatic breast cancer with 3 resection cavities. The anterior right frontal cavity was positive for nodular marginal enhancement, new from brain MRI report 01/07/2015, and consistent with recurrent disease.  08/12/2015:  Lumbar Spine MRI: Osseous metastatic disease at L3, L4, L5, S1, and in the left iliac bone posteriorly. There was no definite epidural metastatic disease. 08/20/2015:  PET scan:  Response to therapy in the lower cervical and thoracic adenopathy.  There was a mixed response to multifocal osseous metastases (some new lesions).  12/06/2015:  Head MRI:  Decreased nodular contrast enhancement at the right frontal lobe treatment site.  There was unchanged appearance of treatment sites within the bilateral parietal lobe size without residual or recurrent disease.  There were no new metastatic lesions. 02/18/2016: PET  scan: Mixed response to therapy. There hadbeen improvement in right cervical lymphadenopathy and in several osseous lesions. There wereseveral other osseous lesions in the pelvis with increasing hypermetabolism compared to the prior study. As the majority of the osseous lesions wereincreasingly sclerotic, some of this hypermetabolism could simply reflect bony healing. There was no new extraskeletal metastatic disease is noted in the neck, chest, abdomen or pelvis. 03/06/2016: Head MRI: Unchanged small focus of nodular enhancement at the right frontal  resection site. There was unchanged appearance of the 2 other resection sites without abnormal enhancement. There was no evidence of new intracranial metastases. 05/23/2016:  Bone scan:  Stable focus of abnormal uptake seen in right frontal skull consistent with prior craniotomy.  There were areas of abnormal uptake identified in the left humeral head, T4 and S1 levels and right iliac crest that were present but decreased in intensity compared to prior exam suggesting improvement.  There was a new foci of abnormal uptake are noted in a right lower rib,T12 and L1 levels of spine concerning for metastatic disease. 06/07/2016:  Thoracic and lumbar spine MRI:  multiple low T1/T2 weighted signal, non enhancing lesions within the thoracic spine, which corresponded in size and location to sclerotic lesions demonstrated on the PET CT of 02/18/2016.  There were no new thoracic lesions.  There was minimal residual contrast enhancement within the L5 vertebral body metastatic lesion, decreased compared to the MRI of 08/12/2015.  There was minimal contrast enhancement at the periphery of the lesion within the L1 vertebral body. This lesion was new compared to MRI of 08/12/2015, but unchanged in size and location compared to PET CT of 02/18/2016.  There was no epidural disease, spinal canal stenosis or neural foraminal encroachment. 06/13/2016:  Head MRI revealed no evidence of residual or recurrent disease.   Labs:  Appointment on 07/31/2016  Component Date Value Ref Range Status  . WBC 07/31/2016 6.2  3.6 - 11.0 K/uL Final  . RBC 07/31/2016 3.38* 3.80 - 5.20 MIL/uL Final  . Hemoglobin 07/31/2016 11.1* 12.0 - 16.0 g/dL Final  . HCT 07/31/2016 32.3* 35.0 - 47.0 % Final  . MCV 07/31/2016 95.6  80.0 - 100.0 fL Final  . MCH 07/31/2016 32.8  26.0 - 34.0 pg Final  . MCHC 07/31/2016 34.3  32.0 - 36.0 g/dL Final  . RDW 07/31/2016 17.7* 11.5 - 14.5 % Final  . Platelets 07/31/2016 318  150 - 440 K/uL Final  .  Neutrophils Relative % 07/31/2016 77  % Final  . Neutro Abs 07/31/2016 4.8  1.4 - 6.5 K/uL Final  . Lymphocytes Relative 07/31/2016 11  % Final  . Lymphs Abs 07/31/2016 0.7* 1.0 - 3.6 K/uL Final  . Monocytes Relative 07/31/2016 9  % Final  . Monocytes Absolute 07/31/2016 0.6  0.2 - 0.9 K/uL Final  . Eosinophils Relative 07/31/2016 2  % Final  . Eosinophils Absolute 07/31/2016 0.1  0 - 0.7 K/uL Final  . Basophils Relative 07/31/2016 1  % Final  . Basophils Absolute 07/31/2016 0.1  0 - 0.1 K/uL Final  . Sodium 07/31/2016 137  135 - 145 mmol/L Final  . Potassium 07/31/2016 4.3  3.5 - 5.1 mmol/L Final  . Chloride 07/31/2016 105  101 - 111 mmol/L Final  . CO2 07/31/2016 26  22 - 32 mmol/L Final  . Glucose, Bld 07/31/2016 101* 65 - 99 mg/dL Final  . BUN 07/31/2016 13  6 - 20 mg/dL Final  . Creatinine, Ser 07/31/2016 0.55  0.44 - 1.00  mg/dL Final  . Calcium 07/31/2016 8.9  8.9 - 10.3 mg/dL Final  . Total Protein 07/31/2016 7.4  6.5 - 8.1 g/dL Final  . Albumin 07/31/2016 3.8  3.5 - 5.0 g/dL Final  . AST 07/31/2016 25  15 - 41 U/L Final  . ALT 07/31/2016 16  14 - 54 U/L Final  . Alkaline Phosphatase 07/31/2016 97  38 - 126 U/L Final  . Total Bilirubin 07/31/2016 0.6  0.3 - 1.2 mg/dL Final  . GFR calc non Af Amer 07/31/2016 >60  >60 mL/min Final  . GFR calc Af Amer 07/31/2016 >60  >60 mL/min Final   Comment: (NOTE) The eGFR has been calculated using the CKD EPI equation. This calculation has not been validated in all clinical situations. eGFR's persistently <60 mL/min signify possible Chronic Kidney Disease.   . Anion gap 07/31/2016 6  5 - 15 Final    Assessment:  Melinda Tanner is a 46 y.o. 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  She initially presented in 2009 while living in [place] with multi-focal right breast cancer with positive lymph node(s) which was ER+, PR+(low), and HER2/neu+. She received neoadjuvant chemotherapy (AC x 4 every 3 weeks followed  by Taxol/Abraxane + carboplatin weekly x 12) without anti-HER2 treatment.   On 07/14/2008, she underwent bilateral mastectomies followed by reconstruction.  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   Zero of 11 lymph nodes on the right were positive for malignancy. 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 Deep margin was negative. Zero of 2 lymph nodes were positive for metastatic disease.  She received adjuvant radiation to the right breast.  She received adjuvant tamoxifen and Zoladex.  She could not tolerate symptoms of joint pain and tamoxifen was discontinued after 1-2 months.  Zoladex was continued for approximately 1.5 years.  She was diagnosed with brain metastases in 02/2011.  Pathology revealed ER+, PR+(low), and HER2/neu-.  She underwent resection followed by [facility].  On 03/09/2011, original breast tissue (09/03/2007) sent to [facility] testing.  Testing revealed ER+, PR+(borderline), and HER2/neu positive (different than initial testing).  She received Herceptin for 1 year beginning in 2013.  She then began Tykerb and Xeloda after completion of Herceptin (2014).  She was on extremely low, and probably sub-therapeutic, dosing of lapatinib + capecitabine.   She developed right supraclavicular adenopathy in 2015. She was noted to have slow disease progression in the right frontal/parietal lesion with minimal presence of systemic disease on PET scans. Capecitabine + lapatinib were discontinued in 04/2014.   She underwent repeat craniotomy with resection of brain metastasis on 09/04/2014.  Biopsy of the right supraclavicular lymph node  was ER+,PR+,HER2/neu-.  She started tamoxifen in 09/2014, but discontinued it secondary to pain in hip, back, and shoulder.  She restarted tamoxifen on 04/23/2015.  Tamoxifen was discontinued on 08/23/2015 secondary to progressive disease.  She received Zoladex on 04/30/2015.  She underwent  laparoscopic bilateral oophorectomy on 05/18/2015.  She receives monthly Xgeva (began 04/30/2015; last 06/30/2016).  She is post-menopausal.  She began Faslodex on 08/23/2015 (last 04/04/2016).  She received 7 cycles of Ibrance (09/20/2015 - 06/18/2016).  Patient received palliative radiation 30 Gy to the left humerus and lumbar spine from 08/25/2015 - 09/09/2015.  Patient is ineligible for clinical trials.  CA27.29 has been followed: 177.8 on 04/16/2015, 276.4 on 05/28/2015, 287.5 on 07/13/2015, 333.8 on 07/26/2015, 412.9 on 08/23/2015, 315.6 on 09/20/2015, 188.8 on 10/18/2015,  151.5 on 11/15/2015, 122.7 on 12/10/2015, 94.6 on 01/10/2016, 80.9 on 02/07/2016, 79 on 03/06/2016, 84.8 on 04/04/2016, 85.2 on 05/02/2016, 91.1 on 05/30/2016, 121 on 06/30/2016, and 182.2 on 07/31/2016.  Bone scan on 05/23/2016 revealed a stable focus of abnormal uptake seen in right frontal skull consistent with prior craniotomy.  There were areas of abnormal uptake identified in the left humeral head, T4 and S1 levels and right iliac crest that were present but decreased in intensity compared to prior exam suggesting possible improvement.  There was a new foci of abnormal uptake are noted in a right lower rib,T12 and L1 levels of spine concerning for metastatic disease  Head MRI on 06/13/2016 revealed no evidence of residual or recurrent disease.  Bone density study on 05/24/2015 revealed a T-score of -0.8 in the right femoral neck (normal).  She completed palliative radiation to T12-L1 of 3000 cGy (06/21/2016 - 07/04/2016).  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  Work-up on 10/25/2015 revealed a B12 of 141 (low).  B12 was 205 on 12/13/2015 with an MMA of 171 (normal) on 01/10/2016.  Ferritin was 43. Iron studies included a saturation of 13% and a TIBC of 354. TSH and folate were normal.  Reticulocyte count was 2.2%.  She declined B12 injections.  She started oral B12 1000  mcg on 11/05/2015.  She takes her B12 sporadically.  B12 level was 205 (low normal) on 12/13/2015.  Diet is good.  She denies any melena or hematochezia.  Symptomatically, pain is poorly controlled.  She is sensitive to pain medications (oxycodone 5 mg makes her sleepy and hallucinate).  Plan: 1.  Labs today:  CBC with diff, CMP, CA27.29. 2.  Discuss pain medications.  Patient sensitive to pain medications.  Discuss Tramadol.  Discuss oxycodone with Tylenol (1/2-1 tablet).  Discuss keeping a pain diary. 3.  Discuss chemotherapy options.  Patient states that she was on Xeloda for 6 months in [place].  Outside notes suggests that she was on a low dose.  She may not have failed Xeloda. 4.  Xgeva today. 5.  Rx: oxycodone 5/acetaminophen 325 mg po q 6 hours prn pain. 6.  Bone scan. 7.  Chest, abdomen, pelvic CT with contrast. 8.  RTC after above.   Lequita Asal, MD  07/31/2016, 3:03 PM

## 2016-07-31 NOTE — Progress Notes (Signed)
Increased pain not managed by oral medications. Patient states that pain is at a level 10.

## 2016-08-01 LAB — CANCER ANTIGEN 27.29: CA 27.29: 182.2 U/mL — ABNORMAL HIGH (ref 0.0–38.6)

## 2016-08-02 ENCOUNTER — Telehealth: Payer: Self-pay | Admitting: *Deleted

## 2016-08-02 NOTE — Telephone Encounter (Signed)
Called and spoke to patient via interpreter.  Separate phone note documented.

## 2016-08-02 NOTE — Telephone Encounter (Signed)
  Let's draw BRCA1/2 testing or full genetic testing with her next appointment.  Do we need to check her insurance?  M

## 2016-08-02 NOTE — Telephone Encounter (Signed)
Patient was contacted through interpreter, Melinda Tanner, regarding her pain medication, Percocet causing nausea and vomiting.  Dr. Mike Gip gave VO for Tramadol 50 mg q12 h  #30.  Patient states she already has a prescription (28 pills) that was given to her in January.  She will take those.  She was also instructed to keep a pain diary. (Time, Pain level, 1 Hour assessment.  She is advised to call us back if there is no improvement with pain or medication tolerance.  Also asked patient if she has had BRCA 1&2 testing.  Patient states she has not.

## 2016-08-02 NOTE — Telephone Encounter (Signed)
Husband called and said that Melinda Tanner is vomiting every time she take percocet.  It helps with pain but wondered if there was another med she could take that would not cause vomiting.  I saw in her med list about hydrocodone but her husband checked with her and she has never taken or got hydrocodone before.  I told him I would call and send message to [facility] team and let them know she needs something different. I called and spoke to anita.

## 2016-08-04 NOTE — Telephone Encounter (Signed)
Will do on 5-29

## 2016-08-09 ENCOUNTER — Encounter: Payer: Self-pay | Admitting: Radiation Oncology

## 2016-08-09 ENCOUNTER — Ambulatory Visit
Admission: RE | Admit: 2016-08-09 | Discharge: 2016-08-09 | Disposition: A | Discharge status: Home / Self Care | Payer: Medicare HMO | Source: Ambulatory Visit | Attending: Radiation Oncology | Admitting: Radiation Oncology

## 2016-08-09 ENCOUNTER — Other Ambulatory Visit: Payer: Self-pay | Admitting: *Deleted

## 2016-08-09 VITALS — BP 125/75 | HR 98 | Temp 99.1°F | Resp 20 | Wt 154.1 lb

## 2016-08-09 DIAGNOSIS — Z170 Estrogen receptor positive status [ER+]: Secondary | ICD-10-CM | POA: Insufficient documentation

## 2016-08-09 DIAGNOSIS — C50911 Malignant neoplasm of unspecified site of right female breast: Secondary | ICD-10-CM | POA: Insufficient documentation

## 2016-08-09 DIAGNOSIS — C7951 Secondary malignant neoplasm of bone: Secondary | ICD-10-CM | POA: Insufficient documentation

## 2016-08-09 DIAGNOSIS — Z510 Encounter for antineoplastic radiation therapy: Secondary | ICD-10-CM | POA: Diagnosis not present

## 2016-08-09 DIAGNOSIS — Z923 Personal history of irradiation: Secondary | ICD-10-CM | POA: Insufficient documentation

## 2016-08-09 MED ORDER — FENTANYL 25 MCG/HR TD PT72: 25.0000 ug | MEDICATED_PATCH | TRANSDERMAL | 0 refills | Status: DC

## 2016-08-09 NOTE — Progress Notes (Signed)
Radiation Oncology Follow up Note  Name: Melinda Tanner   Date:   08/09/2016 MRN:  [id number] DOB: 27-Oct-1970    This 46 y.o. female presents to the clinic today for follow-up for palliative radiation therapy to her spine for stage IV metastatic breast cancer.  REFERRING PROVIDER: Theotis Burrow*  HPI: Patient is a 46 year old Spanish-speaking female accompanied by interpreter and her husband who is now 1 month out from receiving palliative radiation therapy from to T12-L1 for stage IV metastatic breast cancer. She isn't significant pain today emanating from her left hip in the region that does have a increased activity on last bone scan performed I believe in March 2018. She's had excellent pallor results of her spine with almost complete resolution of pain. She is also currently on tramadol although this is not covering her pain.  COMPLICATIONS OF TREATMENT: none  FOLLOW UP COMPLIANCE: keeps appointments   PHYSICAL EXAM:  BP 125/75   Pulse 98   Temp 99.1 F (37.3 C)   Resp 20   Wt 154 lb 1.6 oz (69.9 kg)   LMP 03/19/2015 Comment: Partial Hyst  BMI 27.30 kg/m  Range of motion of her left lower extremity is does elicit pain she does have point tenderness in the acetabular region. Motor sensory and DTR levels are equal and symmetric in the lower extremities bilaterally. Well-developed well-nourished patient in NAD. HEENT reveals PERLA, EOMI, discs not visualized.  Oral cavity is clear. No oral mucosal lesions are identified. Neck is clear without evidence of cervical or supraclavicular adenopathy. Lungs are clear to A&P. Cardiac examination is essentially unremarkable with regular rate and rhythm without murmur rub or thrill. Abdomen is benign with no organomegaly or masses noted. Motor sensory and DTR levels are equal and symmetric in the upper and lower extremities. Cranial nerves II through XII are grossly intact. Proprioception is intact. No peripheral adenopathy or  edema is identified. No motor or sensory levels are noted. Crude visual fields are within normal range.  RADIOLOGY RESULTS: Bone scan is reviewed  PLAN: Present time I to go ahead with palliative radiation therapy to her left hip. Would plan on delivering 3000 cGy in 10 fractions. I have personally ordered and scheduled CT simulation for tomorrow. I discussed the case with medical oncology and will start the patient on a 25 g fentanyl patch to increase to 50 g should her pain did not respond. Instructions for patch placement were given by nurse an interpreter to both patient and her husband. Patient is scheduled for an MRI scan of her left hip I believe later this week will review those results prior to final treatment planning.  I would like to take this opportunity to thank you for allowing me to participate in the care of your patient.Armstead Peaks., MD

## 2016-08-10 ENCOUNTER — Ambulatory Visit
Admission: RE | Admit: 2016-08-10 | Discharge: 2016-08-10 | Disposition: A | Discharge status: Home / Self Care | Payer: Medicare HMO | Source: Ambulatory Visit | Attending: Radiation Oncology | Admitting: Radiation Oncology

## 2016-08-10 DIAGNOSIS — Z170 Estrogen receptor positive status [ER+]: Secondary | ICD-10-CM | POA: Diagnosis not present

## 2016-08-10 DIAGNOSIS — C7931 Secondary malignant neoplasm of brain: Secondary | ICD-10-CM | POA: Diagnosis not present

## 2016-08-10 DIAGNOSIS — Z510 Encounter for antineoplastic radiation therapy: Secondary | ICD-10-CM | POA: Diagnosis not present

## 2016-08-10 DIAGNOSIS — C7951 Secondary malignant neoplasm of bone: Secondary | ICD-10-CM | POA: Diagnosis not present

## 2016-08-10 DIAGNOSIS — C50911 Malignant neoplasm of unspecified site of right female breast: Secondary | ICD-10-CM | POA: Diagnosis not present

## 2016-08-11 ENCOUNTER — Other Ambulatory Visit: Payer: Self-pay | Admitting: Hematology and Oncology

## 2016-08-11 ENCOUNTER — Ambulatory Visit
Admission: RE | Admit: 2016-08-11 | Discharge: 2016-08-11 | Disposition: A | Discharge status: Home / Self Care | Payer: Medicare HMO | Source: Ambulatory Visit | Attending: Hematology and Oncology | Admitting: Hematology and Oncology

## 2016-08-11 DIAGNOSIS — C7951 Secondary malignant neoplasm of bone: Secondary | ICD-10-CM | POA: Insufficient documentation

## 2016-08-11 DIAGNOSIS — C50919 Malignant neoplasm of unspecified site of unspecified female breast: Secondary | ICD-10-CM

## 2016-08-11 DIAGNOSIS — M533 Sacrococcygeal disorders, not elsewhere classified: Secondary | ICD-10-CM | POA: Insufficient documentation

## 2016-08-11 DIAGNOSIS — C7981 Secondary malignant neoplasm of breast: Secondary | ICD-10-CM | POA: Diagnosis not present

## 2016-08-11 MED ORDER — TECHNETIUM TC 99M MEDRONATE IV KIT
25.0000 | PACK | Freq: Once | INTRAVENOUS | Status: AC | PRN
  Administered 2016-08-11: 21.28 via INTRAVENOUS

## 2016-08-11 MED ORDER — IOPAMIDOL (ISOVUE-300) INJECTION 61%
85.0000 mL | Freq: Once | INTRAVENOUS | Status: AC | PRN
  Administered 2016-08-11: 85 mL via INTRAVENOUS

## 2016-08-14 DIAGNOSIS — C50919 Malignant neoplasm of unspecified site of unspecified female breast: Secondary | ICD-10-CM | POA: Diagnosis not present

## 2016-08-14 DIAGNOSIS — C7931 Secondary malignant neoplasm of brain: Secondary | ICD-10-CM | POA: Diagnosis not present

## 2016-08-14 DIAGNOSIS — C7951 Secondary malignant neoplasm of bone: Secondary | ICD-10-CM | POA: Diagnosis not present

## 2016-08-15 ENCOUNTER — Inpatient Hospital Stay (HOSPITAL_BASED_OUTPATIENT_CLINIC_OR_DEPARTMENT_OTHER): Payer: Medicare HMO | Admitting: Hematology and Oncology

## 2016-08-15 ENCOUNTER — Inpatient Hospital Stay: Payer: Medicare HMO

## 2016-08-15 VITALS — BP 111/72 | HR 81 | Temp 98.2°F | Ht 63.0 in | Wt 154.4 lb

## 2016-08-15 DIAGNOSIS — C7951 Secondary malignant neoplasm of bone: Secondary | ICD-10-CM | POA: Diagnosis not present

## 2016-08-15 DIAGNOSIS — Z85841 Personal history of malignant neoplasm of brain: Secondary | ICD-10-CM

## 2016-08-15 DIAGNOSIS — Z170 Estrogen receptor positive status [ER+]: Secondary | ICD-10-CM | POA: Diagnosis not present

## 2016-08-15 DIAGNOSIS — C50911 Malignant neoplasm of unspecified site of right female breast: Secondary | ICD-10-CM | POA: Diagnosis not present

## 2016-08-15 DIAGNOSIS — Z9221 Personal history of antineoplastic chemotherapy: Secondary | ICD-10-CM

## 2016-08-15 DIAGNOSIS — Z7189 Other specified counseling: Secondary | ICD-10-CM

## 2016-08-15 DIAGNOSIS — Z9013 Acquired absence of bilateral breasts and nipples: Secondary | ICD-10-CM

## 2016-08-15 DIAGNOSIS — Z510 Encounter for antineoplastic radiation therapy: Secondary | ICD-10-CM | POA: Diagnosis not present

## 2016-08-15 DIAGNOSIS — C50919 Malignant neoplasm of unspecified site of unspecified female breast: Secondary | ICD-10-CM

## 2016-08-15 DIAGNOSIS — G893 Neoplasm related pain (acute) (chronic): Secondary | ICD-10-CM

## 2016-08-15 DIAGNOSIS — E538 Deficiency of other specified B group vitamins: Secondary | ICD-10-CM

## 2016-08-15 DIAGNOSIS — Z95828 Presence of other vascular implants and grafts: Secondary | ICD-10-CM

## 2016-08-15 DIAGNOSIS — Z923 Personal history of irradiation: Secondary | ICD-10-CM | POA: Diagnosis not present

## 2016-08-15 DIAGNOSIS — D649 Anemia, unspecified: Secondary | ICD-10-CM | POA: Diagnosis not present

## 2016-08-15 DIAGNOSIS — C7931 Secondary malignant neoplasm of brain: Secondary | ICD-10-CM | POA: Diagnosis not present

## 2016-08-15 MED ORDER — SODIUM CHLORIDE 0.9% FLUSH
10.0000 mL | INTRAVENOUS | Status: DC | PRN
  Administered 2016-08-15: 10 mL via INTRAVENOUS
  Filled 2016-08-15: qty 10

## 2016-08-15 MED ORDER — HEPARIN SOD (PORK) LOCK FLUSH 100 UNIT/ML IV SOLN
500.0000 [IU] | Freq: Once | INTRAVENOUS | Status: AC
  Administered 2016-08-15: 500 [IU] via INTRAVENOUS

## 2016-08-15 NOTE — Progress Notes (Signed)
Patient here fro follow up. She reports that her pain is much better controlled this week. She has her medication log and her pain medications for review.

## 2016-08-15 NOTE — Progress Notes (Signed)
[facility] day:  08/15/16   Chief Complaint: Melinda Tanner is a 46 y.o. 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   HPI:  The patient was last seen in the medical oncology clinic on 07/31/2016.  At that time, pain was poorly controlled.  Pain was predominantly in the left hips with radiation.  We discussed various pain medication options.  She had a history of sensitivity to pain medications.  She was given a prescription for oxycodone 5/cetaminopahen 325 mg.  She received Xgeva.  Restaging studies were ordered.  Chest, abdomen, and pelvic CT on 08/11/2016 revealed  new 8 mm right supraclavicular, 12 mm right paratracheal and 10 mm subcarinal lymphadenopathy suspicious for metastatic nodal recurrence.  There was extensive patchy sclerotic osseous metastases throughout the axial and proximal appendicular skeleton appear stable in size and distribution although generally increased in sclerosis since 02/18/2016 PET-CT, which was a nonspecific change that could reflect treatment effect or progression.  There was no additional sites of metastatic disease in the chest, abdomen or pelvis.  Bone scan on 08/11/2016 revealed scattered foci of abnormal osseous tracer accumulation consistent with osseous metastatic disease with new sites of abnormal uptake in the thoracic spine at T5 in the posterior LEFT sixth rib.  Pain medications were adjusted during the interim.  She was started on a Fentanyl 25 mcg patch by [place] on 08/09/2016.  She began further palliative radiation to the left hip on 08/17/2016.  Radiation should complete on 08/30/2016.  Symptomatically, her pain is better controlled.  She needed an ibuprofen on on 08/12/2016.  She notes some hot flashes.   Past Medical History:  Diagnosis Date  . Brain cancer ([place]) 2012   Met.  from Breast  . Breast cancer ([place]) 2009  . Complication of anesthesia    nausea, "drops in potassium and magnesium"  . Seizures ([place])     Past Surgical History:  Procedure Laterality Date  . BRAIN SURGERY  2012, 2106  . CESAREAN SECTION    . LAPAROSCOPIC BILATERAL SALPINGO OOPHERECTOMY Bilateral 05/18/2015   Procedure: LAPAROSCOPIC BILATERAL SALPINGO OOPHORECTOMY;  Surgeon: Will Bonnet, MD;  Location: [facility] ORS;  Service: Gynecology;  Laterality: Bilateral;  . MASTECTOMY Bilateral 2010    Family History  Problem Relation Age of Onset  . Cancer Paternal Aunt   . Cancer Paternal Uncle   . Cancer Paternal Grandfather   . Hypertension Brother   . Diabetes Paternal Grandmother   A paternal aunt had breast cancer age 42, a paternal uncle had prostate cancer, and a paternal grandfather had leukemia.  Social History:  reports that she has never smoked. She has never used smokeless tobacco. She reports that she does not drink alcohol or use drugs.  She is from [place].  She moved to [place] in 2015.  She moved to [place] in 04/2014.  She recently moved into the [place] area.  She has 2 children (boy and girl).  Her family will likely be moving to [facility] in December.  They are looking for a home.  The patient is accompanied by her husband and the Spanish interpreter today.  Allergies:  Allergies  Allergen Reactions  . Taxol [Paclitaxel] Anaphylaxis  . Aspirin Swelling  . Penicillins Swelling  . Zofran [Ondansetron Hcl] Nausea And Vomiting    Current Medications: Current Outpatient Prescriptions  Medication  Sig Dispense Refill  . Cyanocobalamin (VITAMIN B-12 PO) Take by mouth.    . docusate sodium (COLACE) 100 MG capsule Take 1 tablet once or twice daily as needed for constipation while taking narcotic pain medicine 30 capsule 0  . fentaNYL (DURAGESIC - DOSED MCG/HR) 25 MCG/HR patch Place 1 patch (25 mcg total) onto the skin every 3 (three) days. 10 patch 0  .  HYDROcodone-acetaminophen (NORCO/VICODIN) 5-325 MG tablet Take 1-2 tablets by mouth every 4 (four) hours as needed for moderate pain. 30 tablet 0  . ibuprofen (ADVIL,MOTRIN) 800 MG tablet Take 800 mg by mouth every 8 (eight) hours as needed (Takes 1/2 tablet prn).    Marland Kitchen omeprazole (PRILOSEC) 20 MG capsule Take 20 mg by mouth daily.  2  . oxycodone (OXY-IR) 5 MG capsule Take 1 capsule (5 mg total) by mouth every 4 (four) hours as needed. 30 capsule 0  . oxyCODONE-acetaminophen (PERCOCET/ROXICET) 5-325 MG tablet Take 1 tablet by mouth every 4 (four) hours as needed for severe pain. 40 tablet 0  . palbociclib (IBRANCE) 100 MG capsule Take 1 capsule daily for 21 days and then take 1 week off and repeat cycle again (Take whole with food.) 21 capsule 1  . pantoprazole (PROTONIX) 20 MG tablet Take 1 tablet by mouth daily 30 tablet 3  . potassium chloride SA (K-DUR,KLOR-CON) 20 MEQ tablet Take 1 tablet (20 mEq total) by mouth 2 (two) times daily. for 1 days then 1 pill a day x 2 days. 10 tablet 0  . traMADol (ULTRAM) 50 MG tablet Take 50 mg by mouth every 12 (twelve) hours as needed.     No current facility-administered medications for this visit.     Review of Systems:  GENERAL:  Feels "better".  No fevers or sweats.  Weight down 2 pounds. PERFORMANCE STATUS (ECOG):  1 HEENT:  No visual changes, runny nose, sore throat, mouth sores or tenderness. Lungs: No shortness of breath or cough.  No hemoptysis. Cardiac:  No chest pain, palpitations, orthopnea, or PND. GI:  Epigastric discomfort.  Emesis last night.  No diarrhea, constipation, melena or hematochezia.  GU:  No urgency, frequency, dysuria or hematuria. Musculoskeletal: Left hip pain, improved.  No muscle tenderness. Extremities:  No pain or swelling. Skin:  No rashes or irritation. Neuro:  No headache, numbness or weakness, balance or coordination issues.  Endocrine:  No diabetes, thyroid issues, or night sweats.  Hot flashes. Psych:  No mood  changes, depression or anxiety. Pain:  Left hip pain, improved (see HPI). Review of systems:  All other systems reviewed and found to be negative.  Physical Exam: Blood pressure 111/72, pulse 81, temperature 98.2 F (36.8 C), temperature source Oral, height _0  (1.6 m), weight 154 lb 6.4 oz (70 kg), last menstrual period 03/19/2015. GENERAL:  Well developed, well nourished, woman sitting comfortably in the exam room in no acute distress.  MENTAL STATUS:  Alert and oriented to person, place and time. HEAD: Wearing a brown scarf.  Black short hair.  Normocephalic, atraumatic, face symmetric, no Cushingoid features. EYES:  Brown eyes. Pupils equal round and reactive to light and accomodation.  No conjunctivitis or scleral icterus. ENT:  Oropharynx clear without lesion.  Tongue normal. Mucous membranes moist.  RESPIRATORY:  Clear to auscultation without rales, wheezes or rhonchi. CARDIOVASCULAR:  Regular rate and rhythm without murmur, rub or gallop. ABDOMEN:  Soft, non-tender, with active bowel sounds, and no hepatosplenomegaly.  No masses. SKIN:  No rashes, bruises or ulcers.  EXTREMITIES:  No edema, no skin discoloration or tenderness.  No palpable cords. LYMPH NODES:  No palpable cervical, supraclavicular, axillary or inguinal adenopathy  NEUROLOGICAL:  Appropriate. PSYCH:  Appropriate.     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 No lymph invasion. Tumor cellularity 20%. [id number]. 07/13/2008: Left mastectomy: Residual ductal carcinoma in situ, high nuclear grade, deep margin negative ypTisNO(sn)MX. 10/25/2010: Abdominal and pelvic  ultrasound: questionable lesion near gallbladder fossa recommend MRI follow up. 03/02/2011: Left and right frontal excisional brain biopsy: Adenocarcinoma, metastatic, NOS: ER +, PR 5% +, Her 2 NEG. 03/04/2011: [facility] testing of IHC4 residual recurrence risk score of 114 showing an 8 year recurrence reate of 57%. ER POSTIVE, PR POSITIVE, HER 2 POSTIVE (previously documented negative in 2009) cutoff of 361 patient scores 426. ki67 71%.  08/05/2013: Right breast FNA supraclavicular region: positive for metastatic carcinoma. ER/PR/HER2 not reported. 08/19/2014: Right cervical lymph node. Adenocarcinoma with features consistent with metastatic breast carcinoma. ER/PR/HER2 insufficient tissue. 09/04/2014: Repeat craniotomy, resection of right frontal tumor (metastatic breast cancer). ER 90-100% PR 51-60% HER2 - 09/29/2014: Right cervical lymph node, metastatic carcinoma c/w breast primary, ER 100% PR 20% HER2-  Imaging: 08/15/2007: Bilateral Mammogram: Dense breasts with solid lesion at 6:00, 7:00, and 9:00 of right breast, solid lesion at 2:00 position left breast BIRADS 4B. 09/10/2007: Breast MRI: Three solid irregular enhancing lesions of the right breast 2.1 x 3.2, 2.8 x 2.7, and 1.8 x 1.6 cm. Mildly enlarged enhancing nodule right axilla. Left breast clumped enhancement midportion suspicious for malignancy. 03/01/2011: Brain MRI: At least two juxtacortical, intra-axial masses with extensive surrounding vasogenic edema most consistent with intracranial metastasis. 07/22/2013: Brain MRI +/- contrast: interval increase in enhancing component on T2 FLAIR now measuring 1.0cm x 1.1 cm worrisome for progression of disease. 01/21/2014:  PET scan: Hypermetabolic small right supraclavicular and right upper paratracheal lymph nodes suspicious for mets. Hypermetabolic lymph node in the right paratracheal upper mediastinum that measure approximately 0.9 x 0.5cm. (of note no impressions made of areas noted on  brain MRI 07/22/13).  01/21/2014: Head MRI: Right frontal enhancing lesion has shown interval increase in size with surrounding edema and local mass effect (measured 1.8x2.1x1.6, previously 1.2x1.1x1.0) 07/16/2014: Head MRI:  Right anterior frontal enhancing mass 2.5 x 2.0 cm compatible with a metastatic lesion has increased and changed in configuration from previous 2.0 x 1.8 cm, formerly multilobular. 08/06/2014: PET scan:  Multiple bilateral FDG avid low cervical, supraclavicular, upper mediastinal, and right internal mammary lymphadenopathy, likely representing metastatic disease. A cervical node in the right lower neck should be amenable to percutaneous sampling.  12/2014: Head MRI:  Evolution of right frontal postoperative changes status post tumor resection at the vertex. Expected postoperative changes. Minimal marginal enhancement resection bed with some adjacent posterior intrinsic T1 signal again seen. Decreasing T2/FLAIR adjacent parenchymal changes.  Stable resection cavity is left posterior frontal and right parieto-occipital regions. 04/20/2015 : PET scan: Cervical and thoracic nodal metastasis.  There was multifocal osseous metastasis (left transverse T4 process, left humeral head, right iliac wing, and left side of the sacrum).  Incidental findings included right nephrolithiasis.   04/22/2015: Bone scan: Corresponded with PET-CT with metastatic lesions evident in the left humeral head,  left posterior aspect of T4, left aspect of S1, and the right iliac crest.  There was no other definite foci of metastatic disease to bone.  Uptake in the skull was most suggestive of the previous right frontal craniotomy. 05/24/2015: Bone density:  T-score of -0.8 in the right femoral neck (normal). 07/07/2015:  Head MRI:  Metastatic breast cancer with 3 resection cavities. The anterior right frontal cavity was positive for nodular marginal enhancement, new from brain MRI report 01/07/2015, and consistent with  recurrent disease.  08/12/2015:  Lumbar Spine MRI: Osseous metastatic disease at L3, L4, L5, S1, and in the left iliac bone posteriorly. There was no definite epidural metastatic disease. 08/20/2015:  PET scan:  Response to therapy in the lower cervical and thoracic adenopathy.  There was a mixed response to multifocal osseous metastases (some new lesions).  12/06/2015:  Head MRI:  Decreased nodular contrast enhancement at the right frontal lobe treatment site.  There was unchanged appearance of treatment sites within the bilateral parietal lobe size without residual or recurrent disease.  There were no new metastatic lesions. 02/18/2016: PET scan: Mixed response to therapy. There hadbeen improvement in right cervical lymphadenopathy and in several osseous lesions. There wereseveral other osseous lesions in the pelvis with increasing hypermetabolism compared to the prior study. As the majority of the osseous lesions wereincreasingly sclerotic, some of this hypermetabolism could simply reflect bony healing. There was no new extraskeletal metastatic disease is noted in the neck, chest, abdomen or pelvis. 03/06/2016: Head MRI: Unchanged small focus of nodular enhancement at the right frontal resection site. There was unchanged appearance of the 2 other resection sites without abnormal enhancement. There was no evidence of new intracranial metastases. 05/23/2016:  Bone scan:  Stable focus of abnormal uptake seen in right frontal skull consistent with prior craniotomy.  There were areas of abnormal uptake identified in the left humeral head, T4 and S1 levels and right iliac crest that were present but decreased in intensity compared to prior exam suggesting improvement.  There was a new foci of abnormal uptake are noted in a right lower rib,T12 and L1 levels of spine concerning for metastatic disease. 06/07/2016:  Thoracic and lumbar spine MRI:  multiple low T1/T2 weighted signal, non enhancing lesions  within the thoracic spine, which corresponded in size and location to sclerotic lesions demonstrated on the PET CT of 02/18/2016.  There were no new thoracic lesions.  There was minimal residual contrast enhancement within the L5 vertebral body metastatic lesion, decreased compared to the MRI of 08/12/2015.  There was minimal contrast enhancement at the periphery of the lesion within the L1 vertebral body. This lesion was new compared to MRI of 08/12/2015, but unchanged in size and location compared to PET CT of 02/18/2016.  There was no epidural disease, spinal canal stenosis or neural foraminal encroachment. 06/13/2016:  Head MRI revealed no evidence of residual or recurrent disease. 08/11/2016:  Chest, abdomen, and pelvic CT revealed  new 8 mm right supraclavicular, 12 mm right paratracheal and 10 mm subcarinal lymphadenopathy suspicious for metastatic nodal recurrence.  There was extensive patchy sclerotic osseous metastases throughout the axial and proximal appendicular skeleton appear stable in size and distribution although generally increased in sclerosis since 02/18/2016 PET-CT, which was a nonspecific change that could reflect treatment effect or progression.  There was no additional sites of metastatic disease in the chest, abdomen or pelvis. 08/11/2016:  Bone scan revealed scattered foci of abnormal osseous tracer accumulation consistent with osseous metastatic disease with  new sites of abnormal uptake in the thoracic spine at T5 in the posterior LEFT sixth rib.  Labs:  No visits with results within 3 Day(s) from this visit.  Latest known visit with results is:  Appointment on 07/31/2016  Component Date Value Ref Range Status  . WBC 07/31/2016 6.2  3.6 - 11.0 K/uL Final  . RBC 07/31/2016 3.38* 3.80 - 5.20 MIL/uL Final  . Hemoglobin 07/31/2016 11.1* 12.0 - 16.0 g/dL Final  . HCT 07/31/2016 32.3* 35.0 - 47.0 % Final  . MCV 07/31/2016 95.6  80.0 - 100.0 fL Final  . MCH 07/31/2016 32.8  26.0 -  34.0 pg Final  . MCHC 07/31/2016 34.3  32.0 - 36.0 g/dL Final  . RDW 07/31/2016 17.7* 11.5 - 14.5 % Final  . Platelets 07/31/2016 318  150 - 440 K/uL Final  . Neutrophils Relative % 07/31/2016 77  % Final  . Neutro Abs 07/31/2016 4.8  1.4 - 6.5 K/uL Final  . Lymphocytes Relative 07/31/2016 11  % Final  . Lymphs Abs 07/31/2016 0.7* 1.0 - 3.6 K/uL Final  . Monocytes Relative 07/31/2016 9  % Final  . Monocytes Absolute 07/31/2016 0.6  0.2 - 0.9 K/uL Final  . Eosinophils Relative 07/31/2016 2  % Final  . Eosinophils Absolute 07/31/2016 0.1  0 - 0.7 K/uL Final  . Basophils Relative 07/31/2016 1  % Final  . Basophils Absolute 07/31/2016 0.1  0 - 0.1 K/uL Final  . Sodium 07/31/2016 137  135 - 145 mmol/L Final  . Potassium 07/31/2016 4.3  3.5 - 5.1 mmol/L Final  . Chloride 07/31/2016 105  101 - 111 mmol/L Final  . CO2 07/31/2016 26  22 - 32 mmol/L Final  . Glucose, Bld 07/31/2016 101* 65 - 99 mg/dL Final  . BUN 07/31/2016 13  6 - 20 mg/dL Final  . Creatinine, Ser 07/31/2016 0.55  0.44 - 1.00 mg/dL Final  . Calcium 07/31/2016 8.9  8.9 - 10.3 mg/dL Final  . Total Protein 07/31/2016 7.4  6.5 - 8.1 g/dL Final  . Albumin 07/31/2016 3.8  3.5 - 5.0 g/dL Final  . AST 07/31/2016 25  15 - 41 U/L Final  . ALT 07/31/2016 16  14 - 54 U/L Final  . Alkaline Phosphatase 07/31/2016 97  38 - 126 U/L Final  . Total Bilirubin 07/31/2016 0.6  0.3 - 1.2 mg/dL Final  . GFR calc non Af Amer 07/31/2016 >60  >60 mL/min Final  . GFR calc Af Amer 07/31/2016 >60  >60 mL/min Final   Comment: (NOTE) The eGFR has been calculated using the CKD EPI equation. This calculation has not been validated in all clinical situations. eGFR's persistently <60 mL/min signify possible Chronic Kidney Disease.   . Anion gap 07/31/2016 6  5 - 15 Final  . CA 27.29 07/31/2016 182.2* 0.0 - 38.6 U/mL Final   Comment: (NOTE) [facility]ayer Cent[facility]/ACS methodology Performed At: [facility] [address] [place], [place]  [id number] Lindon Romp MD YB:[id number]     Assessment:  Melinda Tanner is a 46 y.o. 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  She initially presented in 2009 while living in [place] with multi-focal right breast cancer with positive lymph node(s) which was ER+, PR+(low), and HER2/neu+. She received neoadjuvant chemotherapy (AC x 4 every 3 weeks followed by Taxol/Abraxane + carboplatin weekly x 12) without anti-HER2 treatment.   On 07/14/2008, she underwent bilateral mastectomies followed by reconstruction.  Pathology in the right breast  revealed a 1.7 cm grade III invasive ductal carcinoma.   Zero of 11 lymph nodes on the right were positive for malignancy. 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 Deep margin was negative. Zero of 2 lymph nodes were positive for metastatic disease.  She received adjuvant radiation to the right breast.  She received adjuvant tamoxifen and Zoladex.  She could not tolerate symptoms of joint pain and tamoxifen was discontinued after 1-2 months.  Zoladex was continued for approximately 1.5 years.  She was diagnosed with brain metastases in 02/2011.  Pathology revealed ER+, PR+(low), and HER2/neu-.  She underwent resection followed by [facility].  On 03/09/2011, original breast tissue (09/03/2007) sent to [facility] testing.  Testing revealed ER+, PR+(borderline), and HER2/neu positive (different than initial testing).  She received Herceptin for 1 year beginning in 2013.  She then began Tykerb and Xeloda after completion of Herceptin (2014).  She was on extremely low, and probably sub-therapeutic, dosing of lapatinib + capecitabine.   She developed right supraclavicular adenopathy in 2015. She was noted to have slow disease progression in the right frontal/parietal lesion with minimal presence of systemic disease on PET scans. Capecitabine + lapatinib were discontinued in 04/2014.   She underwent repeat craniotomy  with resection of brain metastasis on 09/04/2014.  Biopsy of the right supraclavicular lymph node was ER+,PR+,HER2/neu-.  She started tamoxifen in 09/2014, but discontinued it secondary to pain in hip, back, and shoulder.  She restarted tamoxifen on 04/23/2015.  Tamoxifen was discontinued on 08/23/2015 secondary to progressive disease.  She received Zoladex on 04/30/2015.  She underwent laparoscopic bilateral oophorectomy on 05/18/2015.  She receives monthly Xgeva (began 04/30/2015; last 07/31/2016).  She is post-menopausal.  She began Faslodex on 08/23/2015 (last 04/04/2016).  She received 7 cycles of Ibrance (09/20/2015 - 06/18/2016).  Patient received palliative radiation 30 Gy to the left humerus and lumbar spine from 08/25/2015 - 09/09/2015.  She is not eligible for a clinical trial.  CA27.29 has been followed: 177.8 on 04/16/2015, 276.4 on 05/28/2015, 287.5 on 07/13/2015, 333.8 on 07/26/2015, 412.9 on 08/23/2015, 315.6 on 09/20/2015, 188.8 on 10/18/2015, 151.5 on 11/15/2015, 122.7 on 12/10/2015, 94.6 on 01/10/2016, 80.9 on 02/07/2016, 79 on 03/06/2016, 84.8 on 04/04/2016, 85.2 on 05/02/2016, 91.1 on 05/30/2016, and 121 on 06/30/2016.  Head MRI on 06/13/2016 revealed no evidence of residual or recurrent disease.  Chest, abdomen, and pelvic CT on 08/11/2016 revealed  new 8 mm right supraclavicular, 12 mm right paratracheal and 10 mm subcarinal lymphadenopathy suspicious for metastatic nodal recurrence.  There was extensive patchy sclerotic osseous metastases throughout the axial and proximal appendicular skeleton appear stable in size and distribution although generally increased in sclerosis since 02/18/2016 PET-CT, which was a nonspecific change that could reflect treatment effect or progression.  There was no additional sites of metastatic disease in the chest, abdomen or pelvis.  Bone scan on 08/11/2016 revealed scattered foci of abnormal osseous tracer accumulation consistent with osseous  metastatic disease with new sites of abnormal uptake in the thoracic spine at T5 in the posterior LEFT sixth rib.  Bone density study on 05/24/2015 revealed a T-score of -0.8 in the right femoral neck (normal).  She completed palliative radiation to T12-L1 of 3000 cGy (06/21/2016 - 07/04/2016).  She began palliative radiation to the left hip on 08/17/2016.  She is on a Fentanyl 25 mcg/hr patch.  Pain is well controlled.  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  Work-up on 10/25/2015 revealed a B12  of 141 (low).  B12 was 205 on 12/13/2015 with an MMA of 171 (normal) on 01/10/2016.  Ferritin was 43. Iron studies included a saturation of 13% and a TIBC of 354. TSH and folate were normal.  Reticulocyte count was 2.2%.  She declined B12 injections.  She started oral B12 1000 mcg on 11/05/2015.  She takes her B12 sporadically.  B12 level was 205 (low normal) on 12/13/2015.  Diet is good.  She denies any melena or hematochezia.  Symptomatically, pain is well controlled. Exam is stable.  She is receiving palliative radiation.  Plan: 1.  Discuss results of CT scans and bone scan.  CT scans reveal a few new lymph nodes 8-10 mm in size.  Bone scan notes new areas in thoracic spine and left sixth rib.  Discuss plan for repeat biopsy of neck node to ensure disease is not Her2/neu + secondary to earlier testing in [place].  Discuss BRCA testing re: other treatment options. 2.  Discuss current palliative radiation. 3.  Discuss conversation with Dr. Glo Herring.  She has not clearly failed Xeloda. 4.  Discuss current pain medication regimen.  Pain is well controlled. 5.  Schedule ultrasound guided biopsy of neck node. 6.  BRCA1 and BRCA2 testing. 7.  Anticipate next head MRI on 09/13/2016. 8.  RTC after 3 days after biopsy.   Lequita Asal, MD  08/15/2016, 2:25 PM

## 2016-08-16 ENCOUNTER — Ambulatory Visit
Admission: RE | Admit: 2016-08-16 | Discharge: 2016-08-16 | Disposition: A | Discharge status: Home / Self Care | Payer: Medicare HMO | Source: Ambulatory Visit | Attending: Radiation Oncology | Admitting: Radiation Oncology

## 2016-08-16 DIAGNOSIS — Z170 Estrogen receptor positive status [ER+]: Secondary | ICD-10-CM | POA: Diagnosis not present

## 2016-08-16 DIAGNOSIS — C50911 Malignant neoplasm of unspecified site of right female breast: Secondary | ICD-10-CM | POA: Diagnosis not present

## 2016-08-16 DIAGNOSIS — C7951 Secondary malignant neoplasm of bone: Secondary | ICD-10-CM | POA: Diagnosis not present

## 2016-08-16 DIAGNOSIS — Z510 Encounter for antineoplastic radiation therapy: Secondary | ICD-10-CM | POA: Diagnosis not present

## 2016-08-16 DIAGNOSIS — C7931 Secondary malignant neoplasm of brain: Secondary | ICD-10-CM | POA: Diagnosis not present

## 2016-08-17 ENCOUNTER — Ambulatory Visit
Admission: RE | Admit: 2016-08-17 | Discharge: 2016-08-17 | Disposition: A | Discharge status: Home / Self Care | Payer: Medicare HMO | Source: Ambulatory Visit | Attending: Radiation Oncology | Admitting: Radiation Oncology

## 2016-08-17 DIAGNOSIS — Z510 Encounter for antineoplastic radiation therapy: Secondary | ICD-10-CM | POA: Diagnosis not present

## 2016-08-18 ENCOUNTER — Ambulatory Visit
Admission: RE | Admit: 2016-08-18 | Discharge: 2016-08-18 | Disposition: A | Discharge status: Home / Self Care | Payer: Medicare HMO | Source: Ambulatory Visit | Attending: Radiation Oncology | Admitting: Radiation Oncology

## 2016-08-18 DIAGNOSIS — Z510 Encounter for antineoplastic radiation therapy: Secondary | ICD-10-CM | POA: Diagnosis not present

## 2016-08-21 ENCOUNTER — Other Ambulatory Visit: Payer: Self-pay | Admitting: *Deleted

## 2016-08-21 ENCOUNTER — Ambulatory Visit
Admission: RE | Admit: 2016-08-21 | Discharge: 2016-08-21 | Disposition: A | Discharge status: Home / Self Care | Payer: Medicare HMO | Source: Ambulatory Visit | Attending: Radiation Oncology | Admitting: Radiation Oncology

## 2016-08-21 DIAGNOSIS — Z510 Encounter for antineoplastic radiation therapy: Secondary | ICD-10-CM | POA: Diagnosis not present

## 2016-08-21 MED ORDER — PROMETHAZINE HCL 25 MG PO TABS: 25.0000 mg | ORAL_TABLET | Freq: Four times a day (QID) | ORAL | 2 refills | Status: DC | PRN

## 2016-08-22 ENCOUNTER — Encounter: Payer: Self-pay | Admitting: Hematology and Oncology

## 2016-08-22 ENCOUNTER — Other Ambulatory Visit: Payer: Self-pay | Admitting: Student

## 2016-08-22 ENCOUNTER — Ambulatory Visit
Admission: RE | Admit: 2016-08-22 | Discharge: 2016-08-22 | Disposition: A | Discharge status: Home / Self Care | Payer: Medicare HMO | Source: Ambulatory Visit | Attending: Radiation Oncology | Admitting: Radiation Oncology

## 2016-08-22 DIAGNOSIS — Z510 Encounter for antineoplastic radiation therapy: Secondary | ICD-10-CM | POA: Diagnosis not present

## 2016-08-23 ENCOUNTER — Ambulatory Visit
Admission: RE | Admit: 2016-08-23 | Discharge: 2016-08-23 | Disposition: A | Discharge status: Home / Self Care | Payer: Medicare HMO | Source: Ambulatory Visit | Attending: Radiation Oncology | Admitting: Radiation Oncology

## 2016-08-23 ENCOUNTER — Ambulatory Visit
Admission: RE | Admit: 2016-08-23 | Discharge: 2016-08-23 | Disposition: A | Discharge status: Home / Self Care | Payer: Medicare HMO | Source: Ambulatory Visit | Attending: Hematology and Oncology | Admitting: Hematology and Oncology

## 2016-08-23 DIAGNOSIS — C7951 Secondary malignant neoplasm of bone: Secondary | ICD-10-CM | POA: Diagnosis not present

## 2016-08-23 DIAGNOSIS — Z170 Estrogen receptor positive status [ER+]: Secondary | ICD-10-CM | POA: Diagnosis not present

## 2016-08-23 DIAGNOSIS — C50919 Malignant neoplasm of unspecified site of unspecified female breast: Secondary | ICD-10-CM | POA: Diagnosis not present

## 2016-08-23 DIAGNOSIS — C7931 Secondary malignant neoplasm of brain: Secondary | ICD-10-CM | POA: Diagnosis not present

## 2016-08-23 DIAGNOSIS — C778 Secondary and unspecified malignant neoplasm of lymph nodes of multiple regions: Secondary | ICD-10-CM | POA: Diagnosis not present

## 2016-08-23 DIAGNOSIS — R590 Localized enlarged lymph nodes: Secondary | ICD-10-CM | POA: Diagnosis not present

## 2016-08-23 DIAGNOSIS — Z510 Encounter for antineoplastic radiation therapy: Secondary | ICD-10-CM | POA: Diagnosis not present

## 2016-08-23 DIAGNOSIS — Z853 Personal history of malignant neoplasm of breast: Secondary | ICD-10-CM | POA: Diagnosis not present

## 2016-08-23 LAB — PROTIME-INR
INR: 1.04
Prothrombin Time: 13.6 seconds (ref 11.4–15.2)

## 2016-08-23 LAB — CBC
HEMATOCRIT: 34.2 % — AB (ref 35.0–47.0)
Hemoglobin: 11.4 g/dL — ABNORMAL LOW (ref 12.0–16.0)
MCH: 31.4 pg (ref 26.0–34.0)
MCHC: 33.3 g/dL (ref 32.0–36.0)
MCV: 94.4 fL (ref 80.0–100.0)
PLATELETS: 335 10*3/uL (ref 150–440)
RBC: 3.62 MIL/uL — ABNORMAL LOW (ref 3.80–5.20)
RDW: 15.4 % — AB (ref 11.5–14.5)
WBC: 5.8 10*3/uL (ref 3.6–11.0)

## 2016-08-23 LAB — APTT: aPTT: 31 seconds (ref 24–36)

## 2016-08-23 MED ORDER — HEPARIN SOD (PORK) LOCK FLUSH 100 UNIT/ML IV SOLN
INTRAVENOUS | Status: AC
  Filled 2016-08-23: qty 5

## 2016-08-23 MED ORDER — MIDAZOLAM HCL 5 MG/5ML IJ SOLN
INTRAMUSCULAR | Status: AC | PRN
  Administered 2016-08-23: 0.5 mg via INTRAVENOUS

## 2016-08-23 MED ORDER — SODIUM CHLORIDE 0.9 % IV SOLN
INTRAVENOUS | Status: DC
  Administered 2016-08-23: 13:00:00 via INTRAVENOUS

## 2016-08-23 MED ORDER — FENTANYL CITRATE (PF) 100 MCG/2ML IJ SOLN
INTRAMUSCULAR | Status: AC | PRN
  Administered 2016-08-23: 25 ug via INTRAVENOUS

## 2016-08-23 NOTE — Procedures (Signed)
Interventional Radiology Procedure Note  Procedure: US guided core biopsy of right supraclavicular LN  Complications: None  Estimated Blood Loss: < 10 mL  US guided 18 G core biopsy x 4 of 1 cm right supraclavicular lymph node.  Venetia Night. Kathlene Cote, M.D Pager:  [phone]

## 2016-08-24 ENCOUNTER — Ambulatory Visit
Admission: RE | Admit: 2016-08-24 | Discharge: 2016-08-24 | Disposition: A | Discharge status: Home / Self Care | Payer: Medicare HMO | Source: Ambulatory Visit | Attending: Radiation Oncology | Admitting: Radiation Oncology

## 2016-08-24 DIAGNOSIS — Z510 Encounter for antineoplastic radiation therapy: Secondary | ICD-10-CM | POA: Diagnosis not present

## 2016-08-25 ENCOUNTER — Ambulatory Visit
Admission: RE | Admit: 2016-08-25 | Discharge: 2016-08-25 | Disposition: A | Discharge status: Home / Self Care | Payer: Medicare HMO | Source: Ambulatory Visit | Attending: Radiation Oncology | Admitting: Radiation Oncology

## 2016-08-25 DIAGNOSIS — Z510 Encounter for antineoplastic radiation therapy: Secondary | ICD-10-CM | POA: Diagnosis not present

## 2016-08-27 ENCOUNTER — Encounter: Payer: Self-pay | Admitting: Hematology and Oncology

## 2016-08-27 DIAGNOSIS — Z7189 Other specified counseling: Secondary | ICD-10-CM | POA: Insufficient documentation

## 2016-08-28 ENCOUNTER — Ambulatory Visit
Admission: RE | Admit: 2016-08-28 | Discharge: 2016-08-28 | Disposition: A | Discharge status: Home / Self Care | Payer: Medicare HMO | Source: Ambulatory Visit | Attending: Radiation Oncology | Admitting: Radiation Oncology

## 2016-08-28 DIAGNOSIS — Z510 Encounter for antineoplastic radiation therapy: Secondary | ICD-10-CM | POA: Diagnosis not present

## 2016-08-29 ENCOUNTER — Ambulatory Visit
Admission: RE | Admit: 2016-08-29 | Discharge: 2016-08-29 | Disposition: A | Discharge status: Home / Self Care | Payer: Medicare HMO | Source: Ambulatory Visit | Attending: Radiation Oncology | Admitting: Radiation Oncology

## 2016-08-29 DIAGNOSIS — Z510 Encounter for antineoplastic radiation therapy: Secondary | ICD-10-CM | POA: Diagnosis not present

## 2016-08-30 ENCOUNTER — Ambulatory Visit
Admission: RE | Admit: 2016-08-30 | Discharge: 2016-08-30 | Disposition: A | Discharge status: Home / Self Care | Payer: Medicare HMO | Source: Ambulatory Visit | Attending: Radiation Oncology | Admitting: Radiation Oncology

## 2016-08-30 DIAGNOSIS — Z510 Encounter for antineoplastic radiation therapy: Secondary | ICD-10-CM | POA: Diagnosis not present

## 2016-08-30 DIAGNOSIS — C7931 Secondary malignant neoplasm of brain: Secondary | ICD-10-CM | POA: Diagnosis not present

## 2016-08-30 DIAGNOSIS — C7951 Secondary malignant neoplasm of bone: Secondary | ICD-10-CM | POA: Diagnosis not present

## 2016-08-30 DIAGNOSIS — Z170 Estrogen receptor positive status [ER+]: Secondary | ICD-10-CM | POA: Diagnosis not present

## 2016-08-30 DIAGNOSIS — C50911 Malignant neoplasm of unspecified site of right female breast: Secondary | ICD-10-CM | POA: Diagnosis not present

## 2016-08-31 ENCOUNTER — Emergency Department: Payer: Medicare HMO

## 2016-08-31 ENCOUNTER — Telehealth: Payer: Self-pay | Admitting: *Deleted

## 2016-08-31 ENCOUNTER — Ambulatory Visit
Admission: RE | Admit: 2016-08-31 | Discharge: 2016-08-31 | Disposition: A | Discharge status: Home / Self Care | Payer: Medicare HMO | Source: Ambulatory Visit | Attending: Hematology and Oncology | Admitting: Hematology and Oncology

## 2016-08-31 ENCOUNTER — Other Ambulatory Visit: Payer: Self-pay

## 2016-08-31 ENCOUNTER — Encounter: Payer: Self-pay | Admitting: Emergency Medicine

## 2016-08-31 ENCOUNTER — Other Ambulatory Visit: Payer: Self-pay | Admitting: *Deleted

## 2016-08-31 ENCOUNTER — Emergency Department
Admission: EM | Admit: 2016-08-31 | Discharge: 2016-08-31 | Disposition: A | Discharge status: Home / Self Care | Payer: Medicare HMO | Attending: Emergency Medicine | Admitting: Emergency Medicine

## 2016-08-31 ENCOUNTER — Ambulatory Visit
Admission: RE | Admit: 2016-08-31 | Discharge: 2016-08-31 | Disposition: A | Discharge status: Home / Self Care | Payer: Medicare HMO | Source: Ambulatory Visit | Attending: Oncology | Admitting: Oncology

## 2016-08-31 DIAGNOSIS — R0602 Shortness of breath: Secondary | ICD-10-CM

## 2016-08-31 DIAGNOSIS — C50919 Malignant neoplasm of unspecified site of unspecified female breast: Secondary | ICD-10-CM

## 2016-08-31 DIAGNOSIS — Z880 Allergy status to penicillin: Secondary | ICD-10-CM | POA: Diagnosis not present

## 2016-08-31 DIAGNOSIS — C7951 Secondary malignant neoplasm of bone: Secondary | ICD-10-CM | POA: Insufficient documentation

## 2016-08-31 DIAGNOSIS — J181 Lobar pneumonia, unspecified organism: Secondary | ICD-10-CM | POA: Diagnosis not present

## 2016-08-31 DIAGNOSIS — C7931 Secondary malignant neoplasm of brain: Secondary | ICD-10-CM

## 2016-08-31 DIAGNOSIS — J9811 Atelectasis: Secondary | ICD-10-CM

## 2016-08-31 DIAGNOSIS — Z79899 Other long term (current) drug therapy: Secondary | ICD-10-CM | POA: Diagnosis not present

## 2016-08-31 DIAGNOSIS — R531 Weakness: Secondary | ICD-10-CM | POA: Diagnosis not present

## 2016-08-31 DIAGNOSIS — J986 Disorders of diaphragm: Secondary | ICD-10-CM

## 2016-08-31 DIAGNOSIS — J189 Pneumonia, unspecified organism: Secondary | ICD-10-CM

## 2016-08-31 DIAGNOSIS — Z452 Encounter for adjustment and management of vascular access device: Secondary | ICD-10-CM | POA: Diagnosis not present

## 2016-08-31 DIAGNOSIS — R5383 Other fatigue: Secondary | ICD-10-CM | POA: Insufficient documentation

## 2016-08-31 DIAGNOSIS — R079 Chest pain, unspecified: Secondary | ICD-10-CM | POA: Diagnosis not present

## 2016-08-31 LAB — CBC
HCT: 34.2 % — ABNORMAL LOW (ref 35.0–47.0)
Hemoglobin: 11.5 g/dL — ABNORMAL LOW (ref 12.0–16.0)
MCH: 31.2 pg (ref 26.0–34.0)
MCHC: 33.7 g/dL (ref 32.0–36.0)
MCV: 92.4 fL (ref 80.0–100.0)
PLATELETS: 344 10*3/uL (ref 150–440)
RBC: 3.7 MIL/uL — AB (ref 3.80–5.20)
RDW: 15.3 % — AB (ref 11.5–14.5)
WBC: 10.6 10*3/uL (ref 3.6–11.0)

## 2016-08-31 LAB — URINALYSIS, COMPLETE (UACMP) WITH MICROSCOPIC
BILIRUBIN URINE: NEGATIVE
Bacteria, UA: NONE SEEN
GLUCOSE, UA: NEGATIVE mg/dL
Hgb urine dipstick: NEGATIVE
KETONES UR: 20 mg/dL — AB
LEUKOCYTES UA: NEGATIVE
NITRITE: NEGATIVE
PH: 7 (ref 5.0–8.0)
Protein, ur: NEGATIVE mg/dL
Specific Gravity, Urine: >1.046 — ABNORMAL HIGH (ref 1.005–1.030)

## 2016-08-31 LAB — BASIC METABOLIC PANEL
ANION GAP: 8 (ref 5–15)
BUN: 8 mg/dL (ref 6–20)
CO2: 25 mmol/L (ref 22–32)
Calcium: 9.4 mg/dL (ref 8.9–10.3)
Chloride: 100 mmol/L — ABNORMAL LOW (ref 101–111)
Creatinine, Ser: 0.52 mg/dL (ref 0.44–1.00)
Glucose, Bld: 99 mg/dL (ref 65–99)
POTASSIUM: 3.8 mmol/L (ref 3.5–5.1)
SODIUM: 133 mmol/L — AB (ref 135–145)

## 2016-08-31 MED ORDER — LEVOFLOXACIN 750 MG PO TABS: 750.0000 mg | ORAL_TABLET | Freq: Every day | ORAL | 0 refills | Status: AC

## 2016-08-31 MED ORDER — SODIUM CHLORIDE 0.9 % IV BOLUS (SEPSIS)
1000.0000 mL | Freq: Once | INTRAVENOUS | Status: AC
  Administered 2016-08-31: 1000 mL via INTRAVENOUS

## 2016-08-31 MED ORDER — HYDROCODONE-ACETAMINOPHEN 5-325 MG PO TABS: 1.0000 | ORAL_TABLET | ORAL | 0 refills | Status: DC | PRN

## 2016-08-31 MED ORDER — MORPHINE SULFATE (PF) 4 MG/ML IV SOLN
4.0000 mg | Freq: Once | INTRAVENOUS | Status: DC
  Filled 2016-08-31: qty 1

## 2016-08-31 MED ORDER — IOPAMIDOL (ISOVUE-370) INJECTION 76%: 75.0000 mL | Freq: Once | INTRAVENOUS | Status: DC | PRN

## 2016-08-31 MED ORDER — IOPAMIDOL (ISOVUE-370) INJECTION 76%
75.0000 mL | Freq: Once | INTRAVENOUS | Status: AC | PRN
  Administered 2016-08-31: 75 mL via INTRAVENOUS

## 2016-08-31 MED ORDER — LEVOFLOXACIN IN D5W 750 MG/150ML IV SOLN
750.0000 mg | Freq: Once | INTRAVENOUS | Status: AC
  Administered 2016-08-31: 750 mg via INTRAVENOUS
  Filled 2016-08-31: qty 150

## 2016-08-31 MED ORDER — MORPHINE SULFATE (PF) 2 MG/ML IV SOLN
2.0000 mg | Freq: Once | INTRAVENOUS | Status: AC
  Administered 2016-08-31: 2 mg via INTRAVENOUS

## 2016-08-31 NOTE — ED Notes (Signed)
This RN attempted IV access with ultrasound twice and was unsuccessful twice. Charge RN informed and at bedside to attempt access. MD aware of delay.

## 2016-08-31 NOTE — ED Triage Notes (Signed)
Pt  Brought over from CT for further eval of increased weakness and pain starting on Sunday. Pt currently being treated for breast cancer. Last treatment was yesterday. Pt states she is very weak.

## 2016-08-31 NOTE — ED Notes (Signed)
Pt able to eat a sandwich tray. Pt reports it is feeling easier to breath and less painful to cough.

## 2016-08-31 NOTE — ED Provider Notes (Signed)
[facility] Emergency Department Provider Note   ____________________________________________   I have reviewed the triage vital signs and the nursing notes.   HISTORY  Chief Complaint Chest pain  History limited by: Not Limited   HPI Melinda Tanner is a 46 y.o. female who presents to the emergency department today because of concerns for chest pain. The patient states the pain started yesterday. It started around her right shoulder blade however today's wrapped over into her right chest. It does seem to be worse with movement. When she lies on that side it does get somewhat better. Has had some shortness of breath. The patient spoke to her oncologist who had ordered a CT angiogram however they were not able to obtain IV access so she was sent to the emergency department.    Past Medical History:  Diagnosis Date  . Brain cancer ([place]) 2012   Met. from Breast  . Breast cancer ([place]) 2009  . Complication of anesthesia    nausea, "drops in potassium and magnesium"  . Seizures [facility])     Patient Active Problem List   Diagnosis Date Noted  . Goals of care, counseling/discussion 08/27/2016  . B12 deficiency 10/25/2015  . Anemia 10/18/2015  . Reflux 07/01/2015  . Postoperative nausea and vomiting 04/23/2015  . 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 Bone metastasis ([place]) 04/22/2015  . 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 Blepharitis 02/03/2015  . Paralytic ptosis of right eyelid 02/03/2015  . Presbyopia 02/03/2015  . Meibomian gland disease 02/03/2015  . Encounter for prophylactic removal of breast 12/18/2014  . Encounter for prophylactic surgery for risk factor related to malignant neoplasm of breast 12/18/2014  . H/O dysmenorrhea 10/27/2014  . Brain lesion 08/25/2014  . Metastatic breast cancer ([place]) 06/25/2014  . Brain metastases ([place]) 03/01/2011    Past Surgical History:  Procedure Laterality Date   . BRAIN SURGERY  2012, 2106  . CESAREAN SECTION    . LAPAROSCOPIC BILATERAL SALPINGO OOPHERECTOMY Bilateral 05/18/2015   Procedure: LAPAROSCOPIC BILATERAL SALPINGO OOPHORECTOMY;  Surgeon: Will Bonnet, MD;  Location: [facility] ORS;  Service: Gynecology;  Laterality: Bilateral;  . MASTECTOMY Bilateral 2010    Prior to Admission medications   Medication Sig Start Date End Date Taking? Authorizing Provider  Cyanocobalamin (VITAMIN B-12 PO) Take by mouth.    [provider]  docusate sodium (COLACE) 100 MG capsule Take 1 tablet once or twice daily as needed for constipation while taking narcotic pain medicine 06/07/16   Hinda Kehr, MD  fentaNYL (DURAGESIC - DOSED MCG/HR) 25 MCG/HR patch Place 1 patch (25 mcg total) onto the skin every 3 (three) days. 08/09/16   Noreene Filbert, MD  HYDROcodone-acetaminophen (NORCO/VICODIN) 5-325 MG tablet Take 1-2 tablets by mouth every 4 (four) hours as needed for moderate pain. 06/07/16   Hinda Kehr, MD  ibuprofen (ADVIL,MOTRIN) 800 MG tablet Take 800 mg by mouth every 8 (eight) hours as needed (Takes 1/2 tablet prn).    [provider]  omeprazole (PRILOSEC) 20 MG capsule Take 20 mg by mouth daily. 09/07/15   [provider]  oxycodone (OXY-IR) 5 MG capsule Take 1 capsule (5 mg total) by mouth every 4 (four) hours as needed. 05/28/15   Lequita Asal, MD  oxyCODONE-acetaminophen (PERCOCET/ROXICET) 5-325 MG tablet Take 1 tablet by mouth every 4 (four) hours as needed for severe pain. 07/31/16   Lequita Asal, MD  palbociclib Leslee Home) 100 MG capsule Take  1 capsule daily for 21 days and then take 1 week off and repeat cycle again (Take whole with food.) 12/14/15   Lequita Asal, MD  pantoprazole (PROTONIX) 20 MG tablet Take 1 tablet by mouth daily 07/14/16   Lequita Asal, MD  potassium chloride SA (K-DUR,KLOR-CON) 20 MEQ tablet Take 1 tablet (20 mEq total) by mouth 2 (two) times daily. for 1 days then 1 pill a day x 2  days. 09/06/15   Lequita Asal, MD  promethazine (PHENERGAN) 25 MG tablet Take 1 tablet (25 mg total) by mouth every 6 (six) hours as needed for nausea or vomiting. 08/21/16   Noreene Filbert, MD  traMADol (ULTRAM) 50 MG tablet Take 50 mg by mouth every 12 (twelve) hours as needed.    [provider]    Allergies Taxol [paclitaxel]; Aspirin; Penicillins; and Zofran [ondansetron hcl]  Family History  Problem Relation Age of Onset  . Cancer Paternal Aunt   . Cancer Paternal Uncle   . Cancer Paternal Grandfather   . Hypertension Brother   . Diabetes Paternal Grandmother     Social History Social History  Substance Use Topics  . Smoking status: Never Smoker  . Smokeless tobacco: Never Used  . Alcohol use No    Review of Systems Constitutional: No fever/chills Eyes: No visual changes. ENT: No sore throat. Cardiovascular: Positive for chest pain. Respiratory: Positive for shortness of breath. Gastrointestinal: No abdominal pain.  No nausea, no vomiting.  No diarrhea.   Genitourinary: Negative for dysuria. Musculoskeletal: Negative for back pain. Skin: Negative for rash. Neurological: Negative for headaches, focal weakness or numbness.  ____________________________________________   PHYSICAL EXAM:  VITAL SIGNS: ED Triage Vitals  Enc Vitals Group     BP 08/31/16 1545 110/76     Pulse Rate 08/31/16 1545 (!) 121     Resp 08/31/16 1545 (!) 22     Temp 08/31/16 1545 98.9 F (37.2 C)     Temp Source 08/31/16 1545 Oral     SpO2 08/31/16 1545 98 %     Weight --      Height --      Head Circumference --      Peak Flow --      Pain Score 08/31/16 1540 10   Constitutional: Alert and oriented. Well appearing and in no distress. Eyes: Conjunctivae are normal.  ENT   Head: Normocephalic and atraumatic.   Nose: No congestion/rhinnorhea.   Mouth/Throat: Mucous membranes are moist.   Neck: No stridor. Hematological/Lymphatic/Immunilogical: No cervical  lymphadenopathy. Cardiovascular: Normal rate, regular rhythm.  No murmurs, rubs, or gallops.  Respiratory: Normal respiratory effort without tachypnea nor retractions. Breath sounds are clear and equal bilaterally. No wheezes/rales/rhonchi. Gastrointestinal: Soft and non tender. No rebound. No guarding.  Genitourinary: Deferred Musculoskeletal: Normal range of motion in all extremities. No lower extremity edema. Neurologic:  Normal speech and language. No gross focal neurologic deficits are appreciated.  Skin:  Skin is warm, dry and intact. No rash noted. Psychiatric: Mood and affect are normal. Speech and behavior are normal. Patient exhibits appropriate insight and judgment.  ____________________________________________    LABS (pertinent positives/negatives)  Labs Reviewed  BASIC METABOLIC PANEL - Abnormal; Notable for the following:       Result Value   Sodium 133 (*)    Chloride 100 (*)    All other components within normal limits  CBC - Abnormal; Notable for the following:    RBC 3.70 (*)    Hemoglobin 11.5 (*)  HCT 34.2 (*)    RDW 15.3 (*)    All other components within normal limits  URINALYSIS, COMPLETE (UACMP) WITH MICROSCOPIC - Abnormal; Notable for the following:    Color, Urine YELLOW (*)    APPearance CLEAR (*)    Specific Gravity, Urine >1.046 (*)    Ketones, ur 20 (*)    Squamous Epithelial / LPF 0-5 (*)    All other components within normal limits  CBG MONITORING, ED     ____________________________________________   EKG  I, Nance Pear, attending physician, personally viewed and interpreted this EKG  EKG Time: 1530 Rate: 120 Rhythm: sinus tachycardia Axis: normal Intervals: qtc 440 QRS: narrow ST changes: no st elevation Impression: sinus tachycardia otherwise normal ekg   ____________________________________________    RADIOLOGY  CT angio IMPRESSION: 1. No pulmonary emboli. 2. Interval confluent opacity in the posterior aspect of  the right upper lobe, suspicious for pneumonia. 3. Interval small right pleural effusion and adjacent right lower lobe atelectasis. 4. Additional linear atelectasis elsewhere in the right lower lobe and in the left lower lobe. 5. Improved previously demonstrated mild adenopathy and 3 mm left lower lobe nodule compatible with improved metastatic disease. 6. Stable sclerotic bone metastases.   ____________________________________________   PROCEDURES  Procedures  ____________________________________________   INITIAL IMPRESSION / ASSESSMENT AND PLAN / ED COURSE  Pertinent labs & imaging results that were available during my care of the patient were reviewed by me and considered in my medical decision making (see chart for details).  Patient presents to the emergency department today because of concerns for right sided chest pain, weakness. Patient did undergo CT angiogram here. No evidence of pulmonary embolism however it was consistent with pneumonia in the right upper lobe. He do think this could explain the patient's symptoms. Will plan on giving dose of IV antibiotics here. Will give IV fluids. Discussed with Dr. Mike Gip of oncology. They will plan on close follow-up.  ____________________________________________   FINAL CLINICAL IMPRESSION(S) / ED DIAGNOSES  Final diagnoses:  Weakness  Chest pain, unspecified type  Community acquired pneumonia of right upper lobe of lung ([place])     Note: This dictation was prepared with Dragon dictation. Any transcriptional errors that result from this process are unintentional     Nance Pear, MD 08/31/16 2017

## 2016-08-31 NOTE — Discharge Instructions (Signed)
Please seek medical attention for any high fevers, chest pain, shortness of breath, change in behavior, persistent vomiting, bloody stool or any other new or concerning symptoms.  

## 2016-08-31 NOTE — ED Notes (Signed)
Pt taken to car via wheelchair and able to ambulate independently and without difficulty. Pt and husband both verbalized understanding of treatment plan and medications.

## 2016-08-31 NOTE — ED Notes (Signed)
Pt has already been stuck six times by ct staff and IV team. Obtaining labs have been unsuccessful.

## 2016-08-31 NOTE — ED Notes (Signed)
Patient transported to CT 

## 2016-08-31 NOTE — Telephone Encounter (Signed)
Pt's husband called to say that pt has been having a lot of pain just breathing. It has been for 1 week and pt feels that it is getting worse. They asked radiation and they said they do not know why it is happening.  I will speak to [place] and call pt back. Called back and spoke to husband and told him that dr Mike Gip  Says to do a scan to make sure she does not have blood clot in her lungs. , they want pt to come over to hospital now and go to [facility]. Mall to register and then she will have scan.  I have called ct to tell them to keep pt until we get results and the results will be called to [facility]. Husband understands

## 2016-08-31 NOTE — ED Provider Notes (Signed)
-----------------------------------------   9:05 PM on 08/31/2016 -----------------------------------------  The patient is stable after her additional fluids with a heart rate around 100 and a diagnosis of pneumonia.  She has close follow-up available with Dr. Mike Gip.  For her generalized pain I will prescribe some Norco.I reviewed the patient's prescription history over the last 12 months in the multi-state controlled substances database(s) that includes [place], [place], [place], [place], [place], [place], [place], [place], [place], [place], [place], [place], [place], and [place].  The patient has filled no controlled substances during that time.  We will discharge as per Dr. Gennette Pac recommendations for outpatient follow-up with Dr. Mike Gip.   Hinda Kehr, MD 08/31/16 2106

## 2016-08-31 NOTE — ED Notes (Signed)
Pt up to the bathroom. PT able to ambulate independently and without difficulty. Pt reports head pain continues and leg cramping at this time. Pt also requesting to have food and drink if possible.

## 2016-09-01 ENCOUNTER — Telehealth: Payer: Self-pay | Admitting: *Deleted

## 2016-09-01 LAB — SURGICAL PATHOLOGY

## 2016-09-01 NOTE — Telephone Encounter (Signed)
Called patient and discussed with the husband r/t her recent diagnosis of pneumonia, he reported that she was feeling slightly better, informed to go back to the ED if she got worse or to call the physician on call voiced understanding.

## 2016-09-05 ENCOUNTER — Other Ambulatory Visit: Payer: Self-pay

## 2016-09-06 ENCOUNTER — Telehealth: Payer: Self-pay | Admitting: *Deleted

## 2016-09-06 ENCOUNTER — Other Ambulatory Visit: Payer: Self-pay | Admitting: Hematology and Oncology

## 2016-09-06 NOTE — Telephone Encounter (Signed)
Discussed with patient through Kennyth Lose the interpreter that the results of her biopsy were her 2 -, and Dr. Mike Gip would like to restart xeloda at this time if she is willing.  Patient also informed of IV choices available.  Patient voiced understanding. Patient verbalized that she would be willing to try the xeloda again at this time.  Patient informed that we would begin the authorization process and we would call her with more information when we had it. Voiced understanding.

## 2016-09-06 NOTE — Progress Notes (Signed)
START ON PATHWAY REGIMEN - Breast     A cycle is every 21 days:     Capecitabine   **Always confirm dose/schedule in your pharmacy ordering system**    Patient Characteristics: Metastatic Chemotherapy, HER2 Negative/Unknown/Equivocal, ER +, First Line Therapeutic Status: Distant Metastases BRCA Mutation Status: Awaiting Test Results ER Status: Positive (+) HER2 Status: Negative (-) Would you be surprised if this patient died  in the next year? I would be surprised if this patient died in the next year PR Status: Negative (-) Line of therapy: First Line Intent of Therapy: Non-Curative / Palliative Intent, Discussed with Patient

## 2016-09-08 ENCOUNTER — Other Ambulatory Visit: Payer: Self-pay | Admitting: Hematology and Oncology

## 2016-09-08 DIAGNOSIS — C50919 Malignant neoplasm of unspecified site of unspecified female breast: Secondary | ICD-10-CM

## 2016-09-08 MED ORDER — CAPECITABINE 150 MG PO TABS: 150.0000 mg | ORAL_TABLET | Freq: Two times a day (BID) | ORAL | 0 refills | Status: DC

## 2016-09-08 MED ORDER — CAPECITABINE 500 MG PO TABS: 1500.0000 mg | ORAL_TABLET | Freq: Two times a day (BID) | ORAL | 0 refills | Status: DC

## 2016-09-11 ENCOUNTER — Ambulatory Visit
Admission: RE | Admit: 2016-09-11 | Discharge: 2016-09-11 | Disposition: A | Discharge status: Home / Self Care | Payer: Medicare HMO | Source: Ambulatory Visit | Attending: Oncology | Admitting: Oncology

## 2016-09-11 ENCOUNTER — Telehealth: Payer: Self-pay | Admitting: *Deleted

## 2016-09-11 ENCOUNTER — Other Ambulatory Visit: Payer: Self-pay | Admitting: *Deleted

## 2016-09-11 DIAGNOSIS — C50919 Malignant neoplasm of unspecified site of unspecified female breast: Secondary | ICD-10-CM | POA: Insufficient documentation

## 2016-09-11 DIAGNOSIS — J90 Pleural effusion, not elsewhere classified: Secondary | ICD-10-CM | POA: Insufficient documentation

## 2016-09-11 DIAGNOSIS — R0602 Shortness of breath: Secondary | ICD-10-CM | POA: Diagnosis not present

## 2016-09-11 DIAGNOSIS — R05 Cough: Secondary | ICD-10-CM | POA: Diagnosis not present

## 2016-09-11 NOTE — Telephone Encounter (Signed)
Patient and husband was a drop in in clinic today, reports left sided lung pain at scapula with breathing and coughing, patient noted to be coughing, pulse ox 100% on room air, temp 98.2.  Dr. Grayland Ormond notified, CXR ordered and patient and husband instructed via interpreter to go to [facility] for CXR and to take OTC cough medications, and we would call with CXR results voiced understanding.

## 2016-09-11 NOTE — Telephone Encounter (Signed)
Called patient with the use of interpreter and informed her that the cxr was without changes and that Dr. Grayland Ormond did not want to add medications at this time, voiced understanding, pt instructed to call back if she worsened voiced understanding.

## 2016-09-14 ENCOUNTER — Telehealth: Payer: Self-pay | Admitting: *Deleted

## 2016-09-14 NOTE — Telephone Encounter (Signed)
Lattie Haw from [facility] left vm notifying us that patient has been approved for capecitabine (XELODA)  Approval from 09/13/16 to 03/15/17, approval #[id number].

## 2016-09-19 DIAGNOSIS — R69 Illness, unspecified: Secondary | ICD-10-CM | POA: Diagnosis not present

## 2016-09-25 ENCOUNTER — Ambulatory Visit
Admission: RE | Admit: 2016-09-25 | Discharge: 2016-09-25 | Disposition: A | Discharge status: Home / Self Care | Payer: Medicare HMO | Source: Ambulatory Visit | Attending: Radiation Oncology | Admitting: Radiation Oncology

## 2016-09-25 ENCOUNTER — Encounter: Payer: Self-pay | Admitting: Radiation Oncology

## 2016-09-25 VITALS — BP 119/83 | HR 97 | Temp 97.4°F | Resp 20 | Wt 151.5 lb

## 2016-09-25 DIAGNOSIS — Z923 Personal history of irradiation: Secondary | ICD-10-CM | POA: Insufficient documentation

## 2016-09-25 DIAGNOSIS — Z170 Estrogen receptor positive status [ER+]: Secondary | ICD-10-CM | POA: Diagnosis not present

## 2016-09-25 DIAGNOSIS — C7951 Secondary malignant neoplasm of bone: Secondary | ICD-10-CM | POA: Diagnosis not present

## 2016-09-25 DIAGNOSIS — C50911 Malignant neoplasm of unspecified site of right female breast: Secondary | ICD-10-CM | POA: Diagnosis not present

## 2016-09-25 NOTE — Progress Notes (Signed)
Radiation Oncology Follow up Note  Name: Melinda Tanner   Date:   09/25/2016 MRN:  [id number] DOB: [birth year]    This 46 y.o. female presents to the clinic today for one-month follow-up status post palliative radiation therapy to her left hip in patient with stage IV metastatic breast cancer.  REFERRING PROVIDER: Theotis Burrow*  HPI: Patient is a 46 year old female previously treated with palliative radiation therapy to her spine for stage IV metastatic breast cancer. She is now 1 month out receiving palliative radiation therapy to her left hip. She is doing fairly well. She states the left hip is improved also her back is doing well she really is having very little pain or discomfort at this time and under very low little pain management..  COMPLICATIONS OF TREATMENT: none  FOLLOW UP COMPLIANCE: keeps appointments   PHYSICAL EXAM:  BP 119/83   Pulse 97   Temp (!) 97.4 F (36.3 C)   Resp 20   Wt 151 lb 7.3 oz (68.7 kg)   LMP 03/19/2015 Comment: Partial Hyst  BMI 26.83 kg/m  Range of motion of her lower extremities does not elicit pain deep palpation of her spine does not elicit pain motor sensory and DTR levels are equal symmetric in the upper lower extremities. Well-developed well-nourished patient in NAD. HEENT reveals PERLA, EOMI, discs not visualized.  Oral cavity is clear. No oral mucosal lesions are identified. Neck is clear without evidence of cervical or supraclavicular adenopathy. Lungs are clear to A&P. Cardiac examination is essentially unremarkable with regular rate and rhythm without murmur rub or thrill. Abdomen is benign with no organomegaly or masses noted. Motor sensory and DTR levels are equal and symmetric in the upper and lower extremities. Cranial nerves II through XII are grossly intact. Proprioception is intact. No peripheral adenopathy or edema is identified. No motor or sensory levels are noted. Crude visual fields are within normal  range.  RADIOLOGY RESULTS: No current films for review  PLAN: At the present time she achieved excellent palliative benefit from radiation therapy. I will turn follow-up care over to medical oncology. I would be happy to reevaluate the patient any time should further palliative treatment be indicated. Patient is to call with any concerns.  I would like to take this opportunity to thank you for allowing me to participate in the care of your patient.Armstead Peaks., MD

## 2016-09-29 ENCOUNTER — Ambulatory Visit
Admission: RE | Admit: 2016-09-29 | Discharge: 2016-09-29 | Disposition: A | Discharge status: Home / Self Care | Payer: Medicare HMO | Source: Ambulatory Visit | Attending: Hematology and Oncology | Admitting: Hematology and Oncology

## 2016-09-29 ENCOUNTER — Inpatient Hospital Stay: Payer: Medicare HMO | Attending: Hematology and Oncology | Admitting: Hematology and Oncology

## 2016-09-29 VITALS — BP 120/75 | HR 120 | Temp 98.6°F | Resp 20 | Wt 150.6 lb

## 2016-09-29 DIAGNOSIS — R05 Cough: Secondary | ICD-10-CM | POA: Insufficient documentation

## 2016-09-29 DIAGNOSIS — C50919 Malignant neoplasm of unspecified site of unspecified female breast: Secondary | ICD-10-CM | POA: Diagnosis not present

## 2016-09-29 DIAGNOSIS — G893 Neoplasm related pain (acute) (chronic): Secondary | ICD-10-CM

## 2016-09-29 DIAGNOSIS — C7951 Secondary malignant neoplasm of bone: Secondary | ICD-10-CM | POA: Insufficient documentation

## 2016-09-29 DIAGNOSIS — J986 Disorders of diaphragm: Secondary | ICD-10-CM | POA: Diagnosis not present

## 2016-09-29 DIAGNOSIS — Z780 Asymptomatic menopausal state: Secondary | ICD-10-CM | POA: Diagnosis not present

## 2016-09-29 DIAGNOSIS — D649 Anemia, unspecified: Secondary | ICD-10-CM | POA: Insufficient documentation

## 2016-09-29 DIAGNOSIS — Z170 Estrogen receptor positive status [ER+]: Secondary | ICD-10-CM | POA: Diagnosis not present

## 2016-09-29 DIAGNOSIS — J90 Pleural effusion, not elsewhere classified: Secondary | ICD-10-CM | POA: Insufficient documentation

## 2016-09-29 DIAGNOSIS — C7931 Secondary malignant neoplasm of brain: Secondary | ICD-10-CM | POA: Insufficient documentation

## 2016-09-29 DIAGNOSIS — R059 Cough, unspecified: Secondary | ICD-10-CM

## 2016-09-29 DIAGNOSIS — C50911 Malignant neoplasm of unspecified site of right female breast: Secondary | ICD-10-CM | POA: Insufficient documentation

## 2016-09-29 DIAGNOSIS — Z7189 Other specified counseling: Secondary | ICD-10-CM

## 2016-09-29 DIAGNOSIS — Z923 Personal history of irradiation: Secondary | ICD-10-CM | POA: Diagnosis not present

## 2016-09-29 DIAGNOSIS — Z9013 Acquired absence of bilateral breasts and nipples: Secondary | ICD-10-CM | POA: Diagnosis not present

## 2016-09-29 DIAGNOSIS — E538 Deficiency of other specified B group vitamins: Secondary | ICD-10-CM | POA: Insufficient documentation

## 2016-09-29 DIAGNOSIS — Z79811 Long term (current) use of aromatase inhibitors: Secondary | ICD-10-CM

## 2016-09-29 DIAGNOSIS — D0512 Intraductal carcinoma in situ of left breast: Secondary | ICD-10-CM

## 2016-09-29 DIAGNOSIS — C770 Secondary and unspecified malignant neoplasm of lymph nodes of head, face and neck: Secondary | ICD-10-CM | POA: Insufficient documentation

## 2016-09-29 MED ORDER — PANTOPRAZOLE SODIUM 20 MG PO TBEC: DELAYED_RELEASE_TABLET | ORAL | 3 refills | Status: DC

## 2016-09-29 NOTE — Patient Instructions (Signed)
Vinorelbine injection Qu es este medicamento? La VINORELBINA es un agente quimioteraputico. Este medicamento acta sobre las clulas que se dividen rpidamente, como las clulas cancergenas, y finalmente provoca la muerte de estas clulas. Se utiliza en el tratamiento de cncer, como cncer de pulmn. Este medicamento puede ser utilizado para otros usos; si tiene alguna pregunta consulte con su proveedor de atencin mdica o con su farmacutico. MARCAS COMUNES: [place] Qu le debo informar a mi profesional de la salud antes de tomar este medicamento? Necesita saber si usted presenta alguno de los [employer] o situaciones: -trastornos sanguneos -infecciones (especialmente varicela y herpes) -enfermedad heptica -enfermedad pulmonar -enfermedad del sistema nervioso -radioterapia previa o en curso -una reaccin alrgica o inusual a la vinorelbina, a otros agentes quimioteraputicos, a otros medicamentos, alimentos, colorantes o conservantes -si est embarazada o buscando quedar embarazada -si est amamantando a un beb Cmo debo utilizar este medicamento? Este medicamento se administra mediante infusin por va intravenosa. Lo administra un profesional de la salud calificado en un hospital o en un entorno clnico. Si experimenta dolor, hinchazn, ardor o cualquier sensacin inusual alrededor del lugar de la inyeccin, informe inmediatamente a su profesional de KB Home	[place]. Hable con su pediatra para informarse acerca del uso de este medicamento en nios. Puede requerir atencin especial. Sobredosis: Pngase en contacto inmediatamente con un centro toxicolgico o una sala de urgencia si usted cree que haya tomado demasiado medicamento. ATENCIN: [employer] es solo para usted. No comparta este medicamento con nadie. Qu sucede si me olvido de una dosis? Es importante no olvidar ninguna dosis. Informe a su mdico o a su profesional de la salud si no puede asistir a Academic librarian. Qu puede interactuar con este medicamento? No tome esta medicina con ninguno de los siguientes medicamentos: -itraconazol -voriconazol Esta medicina tambin puede interactuar con los siguientes medicamentos: -ciclosporina -eritromicina -fluconazol -quetoconazol -medicamentos que se utilizan para tratar el VIH, incluyendo delavirdina, efavirenz, nevirapina -medicamentos para las convulsiones, tales como etotona, fenobarbital o fenitona -medicamentos para incrementar los conteos sanguneos, tales como filgrastim, pegfilgrastim, sargramostim -otros agentes quimioteraputicos, tales como cisplatino, mitomicina, paclitaxel -vacunas Consulte a su mdico o a su profesional de la salud antes de tomar cualquiera de los siguientes medicamentos: -acetaminofeno -aspirina -ibuprofeno -quetoprofeno -naproxeno Puede ser que esta lista no menciona todas las posibles interacciones. Informe a su profesional de KB Home	[place] de [employer] productos a base de hierbas, medicamentos de [place] o suplementos nutritivos que est tomando. Si usted fuma, consume bebidas alcohlicas o si utiliza drogas ilegales, indqueselo tambin a su profesional de KB Home	[place]. Algunas sustancias pueden interactuar con su medicamento. A qu debo estar atento al usar Coca[employer]Cola? Se supervisar su condicin atentamente mientras reciba este medicamento. Tendr que hacerse anlisis de sangre peridicos mientras est tomando este medicamento. Este medicamento puede hacerle sentir un Nurse, mental health. Esto es normal ya que la quimioterapia afecta tanto a las clulas sanas como a las clulas cancerosas. Si presenta alguno de los [employer], infrmelos. Sin embargo, contine con el tratamiento aun si se siente enfermo, a menos que su mdico le indique que lo suspenda. En algunos casos, podr recibir [employer] para ayudarle con los efectos secundarios. Siga las instrucciones para usarlos. Consulte a su  mdico o a su profesional de la salud por asesoramiento si tiene fiebre, escalofros, dolor de garganta o cualquier otro sntoma de resfro o gripe. No se trate usted mismo. Este medicamento puede reducir la capacidad del cuerpo para combatir infecciones. Trate de no acercarse  a Scientist, product/process development. [employer] puede aumentar el riesgo de magulladuras o sangrado. Consulte a su mdico o a su profesional de la salud si observa sangrados inusuales. Proceda con cuidado al cepillar sus dientes, usar hilo dental o Risk manager palillos para los dientes, ya que puede contraer una infeccin o Therapist, art con mayor facilidad. Si se somete a algn tratamiento dental, informe a su dentista que est [employer]. Evite tomar productos que contienen aspirina, acetaminofeno, ibuprofeno, naproxeno o quetoprofeno a menos que as lo indique su mdico. Estos productos pueden disimular la fiebre. No se debe quedar embarazada mientras est recibiendo Coca-Cola. Las mujeres deben informar a su mdico si estn buscando quedar embarazadas o si creen que estn embarazadas. Existe la posibilidad de efectos secundarios graves a un beb sin nacer. Para ms informacin hable con su profesional de la salud o su farmacutico. No debe amamantar a un beb mientras est tomando este medicamento. Hombres deben Risk manager un condn de ltex durante contacto sexual con Ardelia Mems mujer mientras estn tomando este medicamento y durante 3 meses despus de dejar de Systems developer. Un condn de ltex es necesario incluso si usted ha tenido [employer]. Comunquese con su mdico de inmediato si su pareja se queda embarazada. No done esperma mientras est tomando este medicamento y durante 3 meses despus de dejar de tomar Coca[employer]a. Los hombres deben informar a sus mdicos si desean tener hijos. Este medicamento puede disminuir el conteo de esperma. Qu efectos secundarios puedo tener al [employer] este  medicamento? Efectos secundarios que debe informar a su mdico o a Barrister's clerk de la salud tan pronto como sea posible: -reacciones alrgicas como erupcin cutnea, picazn o urticarias, hinchazn de la cara, labios o lengua -conteos sanguneos bajos - este medicamento puede reducir la cantidad de glbulos blancos, glbulos rojos y plaquetas. Su riesgo de infeccin y [place]. -signos de infeccin - fiebre o escalofros, tos, dolor de garganta, Social research officer, government o dificultad para Garment/textile technologist -signos de reduccin de plaquetas o sangrado - magulladuras, puntos rojos en la piel, heces de color oscuro o con aspecto alquitranado, sangrado por la nariz -signos de reduccin de glbulos rojos - cansancio o debilidad inusual, desmayos, sensacin de mareo -problemas respiratorios -dolor en el pecho -estreimiento -tos -llagas en la boca -nuseas y vmito -dolor, hinchazn, enrojecimiento o irritacin en el lugar de la inyeccin -dolor, hormigueo, entumecimiento de manos o pies -dolor estomacal -dificultad para orinar o cambios en el volumen de orina Efectos secundarios que, por lo general, no requieren atencin mdica (debe informarlos a su mdico o a su profesional de la salud si persisten o si son molestos): -diarrea -cada del cabello -dolor de la mandibula -prdida del apetito Puede ser que esta lista no menciona todos los posibles efectos secundarios. Comunquese a su mdico por asesoramiento mdico [employer] Usted puede informar los efectos secundarios a la FDA por telfono al 1-800-FDA-1088. Dnde debo guardar mi medicina? Este medicamento se administra en hospitales o clnicas y no necesitar guardarlo en su domicilio. ATENCIN: Este folleto es un resumen. Puede ser que no cubra toda la posible informacin. Si usted tiene preguntas acerca de esta medicina, consulte con su mdico, su farmacutico o su profesional de Technical sales engineer.  2018 Elsevier/Gold Standard (2014-04-28  00:00:00) Gemcitabine injection Qu es este medicamento? La GEMCITABINA es un agente quimioteraputico. Este medicamento se [place] en el tratamiento de muchos tipos de cncer, como el cncer de pulmn, de mama, de pncreas y de ovarios.  Este medicamento puede ser utilizado para otros usos; si tiene alguna pregunta consulte con su proveedor de atencin mdica o con su farmacutico. MARCAS COMUNES: [place]zar Qu le debo informar a mi profesional de la salud antes de tomar este medicamento? Necesita saber si usted presenta alguno de los siguientes problemas o situaciones: -trastornos sanguneos -infeccin -enfermedad renal -enfermedad heptica -radioterapia reciente o continua -una reaccin alrgica o inusual a la gemcitabina, a otros agentes quimioteraputicos, a otros medicamentos, alimentos, colorantes o conservantes -si est embarazada o buscando quedar embarazada -si est amamantando a un beb Cmo debo utilizar este medicamento? Este medicamento se administra mediante infusin por va intravenosa. Lo administra un profesional de la salud calificado en un hospital o en un entorno clnico. Hable con su pediatra para informarse acerca del uso de este medicamento en nios. Puede requerir atencin especial. Sobredosis: Pngase en contacto inmediatamente con un centro toxicolgico o una sala de urgencia si usted cree que haya tomado demasiado medicamento. ATENCIN: [employer] es solo para usted. No comparta este medicamento con nadie. Qu sucede si me olvido de una dosis? Es importante no olvidar ninguna dosis. Informe a su mdico o a su profesional de la salud si no puede asistir a Photographer. Qu puede interactuar con este medicamento? -medicamentos para incrementar los conteos sanguneos, tales como filgrastim, pegfilgrastim, sargramostim -algunos otros agentes quimioteraputicos como cisplatino -vacunas Consulte a su mdico o a su profesional de la salud antes de tomar cualquiera  de los siguientes medicamentos: -acetaminofeno -aspirina -ibuprofeno -quetoprofeno -naproxeno Puede ser que esta lista no menciona todas las posibles interacciones. Informe a su profesional de KB Home	[place] de [employer] productos a base de hierbas, medicamentos de [place] o suplementos nutritivos que est tomando. Si usted fuma, consume bebidas alcohlicas o si utiliza drogas ilegales, indqueselo tambin a su profesional de KB Home	[place]. Algunas sustancias pueden interactuar con su medicamento. A qu debo estar atento al usar Coca-Cola? Visite a su mdico para chequear su evolucin. Este medicamento puede hacerle sentir un Nurse, mental health. Esto es normal ya que la quimioterapia afecta tanto a las clulas sanas como a las clulas cancerosas. Si presenta alguno de los [employer], infrmelos. Sin embargo, contine con el tratamiento aun si se siente enfermo, a menos que su mdico le indique que lo suspenda. En algunos casos, podr recibir [employer] para ayudarle con los efectos secundarios. Siga las instrucciones para usarlos. Consulte a su mdico o a su profesional de la salud por asesoramiento si tiene fiebre, escalofros, dolor de garganta o cualquier otro sntoma de resfro o gripe. No se trate usted mismo. Este medicamento puede reducir la capacidad del cuerpo para combatir infecciones. Trate de no acercarse a personas que estn enfermas. [employer] puede aumentar el riesgo de magulladuras o sangrado. Consulte a su mdico o a su profesional de la salud si observa sangrados inusuales. Proceda con cuidado al cepillar sus dientes, usar hilo dental o Risk manager palillos para los dientes, ya que puede contraer una infeccin o Therapist, art con mayor facilidad. Si se somete a algn tratamiento dental, informe a su dentista que est [employer]. Evite tomar productos que contienen aspirina, acetaminofeno, ibuprofeno, naproxeno o quetoprofeno a menos que as lo indique  su mdico. Estos productos pueden disimular la fiebre. Las mujeres deben informar a su mdico si estn buscando quedar embarazadas o si creen que estn embarazadas. Existe la posibilidad de efectos secundarios graves a un beb sin nacer. Para ms informacin hable con su profesional de KB Home	[place] o  su farmacutico. No debe amamantar a un beb mientras est tomando este medicamento. Qu efectos secundarios puedo tener al [employer] este medicamento? Efectos secundarios que debe informar a su mdico o a Barrister's clerk de la salud tan pronto como sea posible: -reacciones alrgicas como erupcin cutnea, picazn o urticarias, hinchazn de la cara, labios o lengua -conteos sanguneos bajos - este medicamento puede reducir la cantidad de glbulos blancos, glbulos rojos y plaquetas. Su riesgo de infeccin y [place]. -signos de infeccin - fiebre o escalofros, tos, dolor de garganta, Social research officer, government o dificultad para orinar -signos de reduccin de plaquetas o sangrado - magulladuras, puntos rojos en la piel, heces de color oscuro o con aspecto alquitranado, sangre en la orina -signos de reduccin de glbulos rojos - cansancio o debilidad inusual, desmayos, sensacin de mareo -problemas respiratorios -dolor en el pecho -llagas en la boca -nuseas y vmito -dolor, enrojecimiento, hinchazn en el lugar de la inyeccin -dolor, hormigueo, entumecimiento de manos o pies -dolor estomacal -hinchazn de tobillos, pies o manos -sangrado inusuales Efectos secundarios que, por lo general, no requieren atencin mdica (debe informarlos a su mdico o a su profesional de la salud si persisten o si son molestos): -estreimiento -diarrea -cada del cabello -prdida del apetito -dolor estomacal Puede ser que esta lista no menciona todos los posibles efectos secundarios. Comunquese a su mdico por asesoramiento mdico [employer] Usted puede informar los efectos secundarios a la FDA por  telfono al 1-800-FDA-1088. Dnde debo guardar mi medicina? Este medicamento se administra en hospitales o clnicas y no necesitar guardarlo en su domicilio. ATENCIN: Este folleto es un resumen. Puede ser que no cubra toda la posible informacin. Si usted tiene preguntas acerca de esta medicina, consulte con su mdico, su farmacutico o su profesional de Technical sales engineer.  2018 Elsevier/Gold Standard (2014-04-28 00:00:00) Eribulin solution for injection Qu es este medicamento? La ERIBULINA es un agente quimioteraputico. Se utiliza para tratar el cncer de mama y el liposarcoma. Este medicamento puede ser utilizado para otros usos; si tiene alguna pregunta consulte con su proveedor de atencin mdica o con su farmacutico. MARCAS COMUNES: [place] Qu le debo informar a mi profesional de la salud antes de tomar este medicamento? Necesitan saber si usted presenta alguno de los siguientes problemas o situaciones: enfermedad cardiaca antecedentes de ritmo cardiaco irregular enfermedad renal enfermedad heptica recuentos sanguneos bajos, como baja cantidad de glbulos blancos, plaquetas o glbulos rojos bajos niveles de potasio o magnesio en la sangre una reaccin alrgica o inusual a la eribulina, a otros medicamentos, alimentos, colorantes o conservantes si est embarazada o buscando quedar embarazada si est amamantando a un beb Cmo debo utilizar este medicamento? Este medicamento se administra mediante infusin por va intravenosa. Lo administra un profesional de Technical sales engineer en un hospital o en un entorno clnico. Hable con su pediatra para informarse acerca del uso de este medicamento en nios. Puede requerir atencin especial. Sobredosis: Pngase en contacto inmediatamente con un centro toxicolgico o una sala de urgencia si usted cree que haya tomado demasiado medicamento. ATENCIN: [employer] es solo para usted. No comparta este medicamento con nadie. Qu sucede si me olvido de una  dosis? Es importante no olvidar ninguna dosis. Informe a su mdico o a su profesional de la salud si no puede asistir a Photographer. Qu puede interactuar con este medicamento? No tome esta medicina con ninguno de los siguientes medicamentos: amiodarona astemizol trixido de arsnico bepridil bretilio cloroquina clorpromacina cisapride claritromicina dextromethorphan, quinidina disopiramida dofetilida  droperidol dronedarona eritromicina grepafloxacino halofantrina haloperidol ibutilida levometadilo mesoridazina metadona pentamidina procainamida quinidina pimozida posaconazol probucol propafenona saquinavir sotalol esparfloxacino terfenadina tioridazina troleandomicina ziprasidona Puede ser que esta lista no menciona todas las posibles interacciones. Informe a su profesional de KB Home	[place] de [employer] productos a base de hierbas, medicamentos de [place] o suplementos nutritivos que est tomando. Si usted fuma, consume bebidas alcohlicas o si utiliza drogas ilegales, indqueselo tambin a su profesional de KB Home	[place]. Algunas sustancias pueden interactuar con su medicamento. A qu debo estar atento al usar Coca-Cola? Este medicamento podra hacerle sentir un Nurse, mental health. Esto es normal ya que la quimioterapia puede Print production planner tanto a las clulas sanas como a las clulas cancerosas. Si presenta algn efecto secundario, infrmelo. Contine con el tratamiento aun si se siente enfermo, a menos que su mdico le indique que lo suspenda. Consulte a su mdico o a su profesional de la salud si tiene fiebre, escalofros o dolor de garganta, o cualquier otro sntoma de resfro o gripe. No se trate usted mismo. Este medicamento reduce la capacidad del cuerpo para combatir infecciones. Trate de no acercarse a personas que estn enfermas. Este medicamento podra aumentar el riesgo de moretones o sangrado. Consulte a su mdico o a su profesional de la salud si observa sangrados inusuales. Usted podra necesitar  realizarse [employer] de sangre mientras est usando [place]. No debe quedar embarazada mientras est tomando este medicamento o por 2 semanas despus de dejar de usarlo. Las mujeres deben informar a su mdico si estn buscando quedar embarazadas o si creen que podran estar embarazadas. Los hombres no deben tener hijos mientras estn recibiendo Coca-Cola y durante 3.5 meses despus de dejar de usarlo. Existe la posibilidad de efectos secundarios graves en un beb sin nacer. Para obtener ms informacin, hable con su profesional de la salud o su farmacutico. No debe amamantar a un beb mientras est tomando este medicamento o por 2 semanas despus de dejar de usarlo. Qu efectos secundarios puedo tener al [employer] este medicamento? Efectos secundarios que debe informar a su mdico o a Barrister's clerk de la salud tan pronto como sea posible: Chief of Staff, como erupcin cutnea, picazn o urticarias, e hinchazn de la cara, los labios o la lengua conteos sanguneos bajos: este medicamento podra reducir la cantidad de glbulos blancos, glbulos rojos y plaquetas. Su riesgo de infeccin y sangrado podra ser mayor. signos de infeccin: fiebre o escalofros, tos, dolor de garganta, Social research officer, government o dificultad para orinar signos de disminucin en la cantidad de plaquetas o sangrado: moretones, puntos rojos en la piel, heces de color negro y aspecto alquitranado, sangre en la orina signos de disminucin en la cantidad de glbulos rojos: cansancio o debilidad inusual, desmayos, aturdimiento Social research officer, government, hormigueo o entumecimiento de las manos o los pies Efectos secundarios que generalmente no requieren atencin mdica (infrmelos a su mdico o a su profesional de la salud si persisten o si son molestos): estreimiento cada del cabello dolor de cabeza prdida del apetito dolores musculares o articulares nuseas, vmito dolor estomacal Puede ser que esta lista no menciona todos los posibles efectos  secundarios. Comunquese a su mdico por asesoramiento mdico [employer] Usted puede informar los efectos secundarios a la FDA por telfono al 1-800-FDA-1088. Dnde debo guardar mi medicina? Este medicamento se administra en hospitales o clnicas y no necesitar guardarlo en su domicilio. ATENCIN: Este folleto es un resumen. Puede ser que no cubra toda la posible informacin. Si usted tiene Engineer, drilling de esta  medicina, consulte con su mdico, su farmacutico o su profesional de KB Home	[place].  2018 Elsevier/Gold Standard (2016-04-06 00:00:00) Nanoparticle Albumin-Bound Paclitaxel injection Qu es este medicamento? El NANOPARTICULA DE PACLITAXEL UNIDO A ALBMINA es un agente quimioteraputico. Este medicamento acta sobre las clulas que se dividen rpidamente, como las clulas cancerosas, y finalmente provoca la muerte de estas clulas. Este medicamento se [employer] tratamiento del cncer de mama avanzado y el cncer de pulmn avanzado. Este medicamento puede ser utilizado para otros usos; si tiene alguna pregunta consulte con su proveedor de atencin mdica o con su farmacutico. MARCAS COMUNES: [place] Qu le debo informar a mi profesional de la salud antes de tomar este medicamento? Necesita saber si usted presenta alguno de los siguientes problemas o situaciones: -enfermedad renal -enfermedad heptica -conteos sanguneos bajos, como baja cantidad de glbulos blancos, glbulos rojos y plaquetas -radioterapia reciente o en curso -una reaccin alrgica o inusual al paclitaxel, albmina, otros agentes quimioteraputicos, a otros medicamentos, alimentos, colorantes o conservantes -si est embarazada o buscando quedar embarazada -si est amamantando a un beb Cmo debo utilizar este medicamento? Este medicamento se administra como infusin en una vena. Un profesional de la salud especialmente capacitado lo administra en un hospital o clnica. Hable con su pediatra  para informarse acerca del uso de este medicamento en nios. Puede requerir atencin especial. Sobredosis: Pngase en contacto inmediatamente con un centro toxicolgico o una sala de urgencia si usted cree que haya tomado demasiado medicamento. ATENCIN: [employer] es solo para usted. No comparta este medicamento con nadie. Qu sucede si me olvido de una dosis? Es importante no olvidar ninguna dosis. Informe a su mdico o a su profesional de la salud si no puede asistir a Photographer. Qu puede interactuar con este medicamento? -ciclosporina -diazepam -quetoconazol -medicamentos para incrementar los conteos sanguneos, tales como filgrastim, pegfilgrastim, sargramostim -otros agentes quimioteraputicos, tales como cisplatino, doxorrubicina, epirubicina, etopsido, teniposida, vincristina -quinidina -testosterona -vacunas -verapamilo Consulte a su mdico o a su profesional de la salud antes de tomar cualquiera de los siguientes medicamentos: -acetaminofeno -aspirina -ibuprofeno -quetoprofeno -naproxeno Puede ser que esta lista no menciona todas las posibles interacciones. Informe a su profesional de KB Home	[place] de [employer] productos a base de hierbas, medicamentos de [place] o suplementos nutritivos que est tomando. Si usted fuma, consume bebidas alcohlicas o si utiliza drogas ilegales, indqueselo tambin a su profesional de KB Home	[place]. Algunas sustancias pueden interactuar con su medicamento. A qu debo estar atento al usar Coca-Cola? Se supervisar su estado de salud atentamente mientras reciba este medicamento. Tendr que hacerse anlisis de sangre importantes mientras est tomando este medicamento. Este medicamento puede causar Chief of Staff graves. Si tiene [employer] erupcin cutnea, comezn/picazn o urticaria, hinchazn del rostro, los labios, o la [place], informe de inmediato a su mdico o profesional de Technical sales engineer. En algunos casos, podra  recibir [employer] para ayudarlo con los efectos secundarios. Siga todas las instrucciones para usarlos. Este medicamento podra hacerle sentir un Nurse, mental health. Esto es normal ya que la quimioterapia afecta tanto a las clulas sanas como a las clulas cancerosas. Si presenta algn efecto secundario, infrmelo. Sin embargo, contine con el tratamiento aun si se siente enfermo, a menos que su mdico le indique que lo suspenda. Consulte a su mdico o a su profesional de la salud si tiene fiebre, escalofros o dolor de garganta, o cualquier otro sntoma de resfro o gripe. No se trate usted mismo. Este medicamento reduce la capacidad del cuerpo para  combatir infecciones. Trate de no acercarse a personas que estn enfermas. Este medicamento podra aumentar el riesgo de moretones o sangrado. Consulte a su mdico o a su profesional de la salud si observa sangrados inusuales. Proceda con cuidado al cepillar sus dientes, usar hilo dental o Risk manager palillos para los dientes, ya que podra contraer una infeccin o Therapist, art con mayor facilidad. Si se somete a algn tratamiento dental, informe a su dentista que est [employer]. Evite tomar productos que contienen aspirina, acetaminofeno, ibuprofeno, naproxeno o ketoprofeno, a menos que as lo indique su mdico. Estos productos pueden ocultar la fiebre. No debe quedar embarazada mientras reciba este medicamento. Las mujeres deben informar a su mdico si estn buscando quedar embarazadas o si creen que estn embarazadas. Existe la posibilidad de efectos secundarios graves en un beb sin nacer. Para obtener ms informacin, hable con su profesional de la salud o su farmacutico. No debe amamantar a un beb mientras est tomando este medicamento. Para los hombres, se desaconseja tener nios mientras reciben [employer]. Qu efectos secundarios puedo tener al [employer] este medicamento? Efectos secundarios que debe informar a su mdico o a  Barrister's clerk de la salud tan pronto como sea posible: -reacciones alrgicas como erupcin cutnea, picazn o urticarias, hinchazn de la cara, labios o lengua -conteos sanguneos bajos - Este medicamento puede reducir la cantidad de glbulos blancos, glbulos rojos y plaquetas. Su riesgo de infeccin y [place]. -signos de infeccin - fiebre o escalofros, tos, dolor de garganta, Social research officer, government o dificultad para orinar -signos de reduccin de plaquetas o sangrado - magulladuras, puntos rojos en la piel, heces de color oscuro o con aspecto alquitranado, sangrando por la nariz -signos de reduccin de glbulos rojos - cansancio o debilidad inusual, desmayos, sensacin de mareo -problemas respiratorios -cambios en la visin -dolor en el pecho -alta o baja presin sangunea -llagas en la boca -nuseas, vmito -dolor, enrojecimiento, hinchazn o irritacin en el lugar de la inyeccin -dolor, hormigueo, entumecimiento de manos o pies -pulso cardiaco lento o irregular -hinchazn de los tobillos, pies, manos Efectos secundarios que, por lo general, no requieren atencin mdica (debe informarlos a su mdico o a su profesional de la salud si persisten o si son molestos): -molestias, dolores -cambios en el color de las uas de las manos -diarrea -cada del cabello -prdida del apetito Puede ser que esta lista no menciona todos los posibles efectos secundarios. Comunquese a su mdico por asesoramiento mdico [employer] Usted puede informar los efectos secundarios a la FDA por telfono al 1-800-FDA-1088. Dnde debo guardar mi medicina? Este medicamento se administra en hospitales o clnicas y no necesitar guardarlo en su domicilio. ATENCIN: Este folleto es un resumen. Puede ser que no cubra toda la posible informacin. Si usted tiene preguntas acerca de esta medicina, consulte con su mdico, su farmacutico o su profesional de Technical sales engineer.  2018 [employer]  (2016-04-06 00:00:00)

## 2016-09-29 NOTE — Progress Notes (Addendum)
[name] day:  09/29/16   Chief Complaint: Melinda Tanner is a 46 y.o. 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   HPI:  The patient was last seen in the medical oncology clinic on 08/15/2016.  At that time, she was receiving palliative radiation.  We discussed obtaining a biopsy to assess ER/PR and Her2/neu status given the discrepancy in prior Her2/neu testing.  We discussed several treatment options.  She had not failed Xeloda per Dr. Glo Herring.  We discussed initiation of treatment after completion of radiation.   BRCA1/2 testing was negative on 08/15/2016.  Ultrasound guided biopsy of a right supraclavicular node on 08/23/2016 revealed metastatic adenocarcinoma morphologically consistent with mammary carcinoma. Tumor was ER positive (> 90%) PR negative and HER-2/neu negative (1+ IHC).    A prescription for Xeloda was written, but never filled as the cost was prohibitive.  No assistance was available.  She was seen in the emergency room on 08/31/2016 and diagnosed with pneumonia. She presented with right chest pain.  Chest x-ray revealed asymmetric elevation of the right hemidiaphragm with right base atelectasis.  Chest CT angiogram revealed interval confluent opacity in the posterior aspect of the right upper lobe suspicious for pneumonia. There was a small right-sided pleural effusion and adjacent right lower lobe atelectasis.  She had a persistent cough after treatment of pneumonia.  She denies any fever.  Pain is controlled.   Past Medical History:  Diagnosis Date  . Brain cancer ([place]) 2012   Met. from Breast  . Breast cancer ([place]) 2009  . Complication of anesthesia    nausea, "drops in potassium and magnesium"  . Seizures ([place])     Past Surgical History:  Procedure Laterality Date  . BRAIN SURGERY  2012,  2106  . CESAREAN SECTION    . LAPAROSCOPIC BILATERAL SALPINGO OOPHERECTOMY Bilateral 05/18/2015   Procedure: LAPAROSCOPIC BILATERAL SALPINGO OOPHORECTOMY;  Surgeon: Will Bonnet, MD;  Location: [facility] ORS;  Service: Gynecology;  Laterality: Bilateral;  . MASTECTOMY Bilateral 2010    Family History  Problem Relation Age of Onset  . Cancer Paternal Aunt   . Cancer Paternal Uncle   . Cancer Paternal Grandfather   . Hypertension Brother   . Diabetes Paternal Grandmother   A paternal aunt had breast cancer age 48, a paternal uncle had prostate cancer, and a paternal grandfather had leukemia.  Social History:  reports that she has never smoked. She has never used smokeless tobacco. She reports that she does not drink alcohol or use drugs.  She is from [place].  She moved to [place] in 2015.  She moved to [place] in 04/2014.  She recently moved into the [place] area.  She has 2 children (boy and girl).  Her family will likely be moving to [facility] in December.  They are looking for a home.  The patient is accompanied by her husband and the Spanish interpreter today.  Allergies:  Allergies  Allergen Reactions  . Taxol [Paclitaxel] Anaphylaxis  . Aspirin Swelling  . Penicillins Swelling  . Zofran [Ondansetron Hcl] Nausea And Vomiting    Current Medications: Current Outpatient Prescriptions  Medication Sig Dispense Refill  . capecitabine (XELODA) 150 MG tablet Take 1 tablet (150 mg total) by mouth 2 (two) times daily after a meal. Take with 500 mg prescription 28 tablet 0  . capecitabine (  XELODA) 500 MG tablet Take 3 tablets (1,500 mg total) by mouth 2 (two) times daily after a meal. Take with 150 mg prescription 84 tablet 0  . Cyanocobalamin (VITAMIN B-12 PO) Take by mouth.    . docusate sodium (COLACE) 100 MG capsule Take 1 tablet once or twice daily as needed for constipation while taking narcotic pain medicine 30 capsule 0  . fentaNYL (DURAGESIC - DOSED MCG/HR) 25  MCG/HR patch Place 1 patch (25 mcg total) onto the skin every 3 (three) days. (Patient not taking: Reported on 09/25/2016) 10 patch 0  . HYDROcodone-acetaminophen (NORCO/VICODIN) 5-325 MG tablet Take 1-2 tablets by mouth every 4 (four) hours as needed for moderate pain. (Patient not taking: Reported on 09/25/2016) 30 tablet 0  . ibuprofen (ADVIL,MOTRIN) 800 MG tablet Take 800 mg by mouth every 8 (eight) hours as needed (Takes 1/2 tablet prn).    Marland Kitchen omeprazole (PRILOSEC) 20 MG capsule Take 20 mg by mouth daily.  2  . oxycodone (OXY-IR) 5 MG capsule Take 1 capsule (5 mg total) by mouth every 4 (four) hours as needed. (Patient not taking: Reported on 09/25/2016) 30 capsule 0  . oxyCODONE-acetaminophen (PERCOCET/ROXICET) 5-325 MG tablet Take 1 tablet by mouth every 4 (four) hours as needed for severe pain. (Patient not taking: Reported on 09/25/2016) 40 tablet 0  . palbociclib (IBRANCE) 100 MG capsule Take 1 capsule daily for 21 days and then take 1 week off and repeat cycle again (Take whole with food.) (Patient not taking: Reported on 09/25/2016) 21 capsule 1  . pantoprazole (PROTONIX) 20 MG tablet Take 1 tablet by mouth daily 30 tablet 3  . potassium chloride SA (K-DUR,KLOR-CON) 20 MEQ tablet Take 1 tablet (20 mEq total) by mouth 2 (two) times daily. for 1 days then 1 pill a day x 2 days. 10 tablet 0  . promethazine (PHENERGAN) 25 MG tablet Take 1 tablet (25 mg total) by mouth every 6 (six) hours as needed for nausea or vomiting. 30 tablet 2  . traMADol (ULTRAM) 50 MG tablet Take 50 mg by mouth every 12 (twelve) hours as needed.     No current facility-administered medications for this visit.     Review of Systems:  GENERAL:  Feels "ok".  No fevers or sweats.  Weight down 4 pounds. PERFORMANCE STATUS (ECOG):  1 HEENT:  No visual changes, runny nose, sore throat, mouth sores or tenderness. Lungs: Cough, persistent.  No shortness of breath.  No hemoptysis. Cardiac:  No chest pain, palpitations, orthopnea, or  PND. GI:  Epigastric discomfort.  No diarrhea, constipation, melena or hematochezia.  GU:  No urgency, frequency, dysuria or hematuria. Musculoskeletal: Left hip pain, improved s/p radiation.  No muscle tenderness. Extremities:  No pain or swelling. Skin:  No rashes or irritation. Neuro:  No headache, numbness or weakness, balance or coordination issues.  Endocrine:  No diabetes, thyroid issues, or night sweats.  Hot flashes. Psych:  No mood changes, depression or anxiety. Pain:  No pain. Review of systems:  All other systems reviewed and found to be negative.  Physical Exam: Last menstrual period 03/19/2015. GENERAL:  Well developed, well nourished, woman sitting comfortably in the exam room in no acute distress. She is tearful at times. MENTAL STATUS:  Alert and oriented to person, place and time. HEAD: Wearing a scarf.  Black short hair.  Normocephalic, atraumatic, face symmetric, no Cushingoid features. EYES:  Brown eyes. Pupils equal round and reactive to light and accomodation.  No conjunctivitis or scleral icterus.  ENT:  Oropharynx clear without lesion.  Tongue normal. Mucous membranes moist.  RESPIRATORY:  Decreased breath sounds right sided 1/3 way up.  Otherwise, clear to auscultation without rales, wheezes or rhonchi. CARDIOVASCULAR:  Regular rate and rhythm without murmur, rub or gallop. ABDOMEN:  Soft, non-tender, with active bowel sounds, and no hepatosplenomegaly.  No masses. SKIN:  No rashes, bruises or ulcers. EXTREMITIES:  No edema, no skin discoloration or tenderness.  No palpable cords. LYMPH NODES:  No palpable cervical, supraclavicular, axillary or inguinal adenopathy  NEUROLOGICAL:  Appropriate. PSYCH:  Appropriate.     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 No lymph invasion. Tumor cellularity 20%. [id number]. 07/13/2008: Left mastectomy: Residual ductal carcinoma in situ, high nuclear grade, deep margin negative ypTisNO(sn)MX. 10/25/2010: Abdominal and pelvic ultrasound: questionable lesion near gallbladder fossa recommend MRI follow up. 03/02/2011: Left and right frontal excisional brain biopsy: Adenocarcinoma, metastatic, NOS: ER +, PR 5% +, Her 2 NEG. 03/04/2011: [facility] testing of IHC4 residual recurrence risk score of 114 showing an 8 year recurrence reate of 57%. ER POSTIVE, PR POSITIVE, HER 2 POSTIVE (previously documented negative in 2009) cutoff of 361 patient scores 426. ki67 71%.  08/05/2013: Right breast FNA supraclavicular region: positive for metastatic carcinoma. ER/PR/HER2 not reported. 08/19/2014: Right cervical lymph node. Adenocarcinoma with features consistent with metastatic breast carcinoma. ER/PR/HER2 insufficient tissue. 09/04/2014: Repeat craniotomy, resection of right frontal tumor (metastatic breast cancer). ER 90-100% PR 51-60% HER2 - 09/29/2014: Right cervical lymph node, metastatic carcinoma c/w breast primary, ER 100% PR 20% HER2-  Imaging: 08/15/2007: Bilateral Mammogram: Dense breasts with solid lesion at 6:00, 7:00, and 9:00 of right breast, solid lesion at 2:00 position left breast BIRADS 4B. 09/10/2007: Breast MRI: Three solid irregular enhancing lesions of the right breast 2.1 x 3.2, 2.8 x 2.7, and 1.8 x 1.6 cm. Mildly enlarged enhancing nodule right axilla. Left breast clumped enhancement midportion suspicious for malignancy. 03/01/2011: Brain MRI: At least two juxtacortical, intra-axial masses with extensive surrounding vasogenic edema most consistent with intracranial metastasis. 07/22/2013: Brain MRI +/- contrast: interval increase in enhancing component on  T2 FLAIR now measuring 1.0cm x 1.1 cm worrisome for progression of disease. 01/21/2014:  PET scan: Hypermetabolic small right supraclavicular and right upper paratracheal lymph nodes suspicious for mets. Hypermetabolic lymph node in the right paratracheal upper mediastinum that measure approximately 0.9 x 0.5cm. (of note no impressions made of areas noted on brain MRI 07/22/13).  01/21/2014: Head MRI: Right frontal enhancing lesion has shown interval increase in size with surrounding edema and local mass effect (measured 1.8x2.1x1.6, previously 1.2x1.1x1.0) 07/16/2014: Head MRI:  Right anterior frontal enhancing mass 2.5 x 2.0 cm compatible with a metastatic lesion has increased and changed in configuration from previous 2.0 x 1.8 cm, formerly multilobular. 08/06/2014: PET scan:  Multiple bilateral FDG avid low cervical, supraclavicular, upper mediastinal, and right internal mammary lymphadenopathy, likely representing metastatic disease. A cervical node in the right lower neck should be amenable to percutaneous sampling.  12/2014: Head MRI:  Evolution of right frontal postoperative changes status post tumor resection at the vertex. Expected postoperative changes. Minimal marginal enhancement resection bed with some adjacent posterior intrinsic T1 signal again seen. Decreasing T2/FLAIR adjacent parenchymal changes.  Stable resection cavity is left posterior frontal and right parieto-occipital regions. 04/20/2015:  PET scan: Cervical and thoracic nodal metastasis.  There was multifocal osseous metastasis (left transverse T4 process, left humeral head, right iliac wing, and left side of the sacrum).  Incidental findings included right nephrolithiasis.   04/22/2015: Bone scan: Corresponded with PET-CT with metastatic lesions evident in the left humeral head, left posterior aspect of T4, left aspect of S1, and the right iliac crest.  There was no other definite foci of metastatic disease to bone.  Uptake in the  skull was most suggestive of the previous right frontal craniotomy. 05/24/2015: Bone density:  T-score of -0.8 in the right femoral neck (normal). 07/07/2015:  Head MRI:  Metastatic breast cancer with 3 resection cavities. The anterior right frontal cavity was positive for nodular marginal enhancement, new from brain MRI report 01/07/2015, and consistent with recurrent disease.  08/12/2015:  Lumbar Spine MRI: Osseous metastatic disease at L3, L4, L5, S1, and in the left iliac bone posteriorly. There was no definite epidural metastatic disease. 08/20/2015:  PET scan:  Response to therapy in the lower cervical and thoracic adenopathy.  There was a mixed response to multifocal osseous metastases (some new lesions).  12/06/2015:  Head MRI:  Decreased nodular contrast enhancement at the right frontal lobe treatment site.  There was unchanged appearance of treatment sites within the bilateral parietal lobe size without residual or recurrent disease.  There were no new metastatic lesions. 02/18/2016: PET scan: Mixed response to therapy. There hadbeen improvement in right cervical lymphadenopathy and in several osseous lesions. There wereseveral other osseous lesions in the pelvis with increasing hypermetabolism compared to the prior study. As the majority of the osseous lesions wereincreasingly sclerotic, some of this hypermetabolism could simply reflect bony healing. There was no new extraskeletal metastatic disease is noted in the neck, chest, abdomen or pelvis. 03/06/2016: Head MRI: Unchanged small focus of nodular enhancement at the right frontal resection site. There was unchanged appearance of the 2 other resection sites without abnormal enhancement. There was no evidence of new intracranial metastases. 05/23/2016:  Bone scan:  Stable focus of abnormal uptake seen in right frontal skull consistent with prior craniotomy.  There were areas of abnormal uptake identified in the left humeral head, T4 and  S1 levels and right iliac crest that were present but decreased in intensity compared to prior exam suggesting improvement.  There was a new foci of abnormal uptake are noted in a right lower rib,T12 and L1 levels of spine concerning for metastatic disease. 06/07/2016:  Thoracic and lumbar spine MRI:  multiple low T1/T2 weighted signal, non enhancing lesions within the thoracic spine, which corresponded in size and location to sclerotic lesions demonstrated on the PET CT of 02/18/2016.  There were no new thoracic lesions.  There was minimal residual contrast enhancement within the L5 vertebral body metastatic lesion, decreased compared to the MRI of 08/12/2015.  There was minimal contrast enhancement at the periphery of the lesion within the L1 vertebral body. This lesion was new compared to MRI of 08/12/2015, but unchanged in size and location compared to PET CT of 02/18/2016.  There was no epidural disease, spinal canal stenosis or neural foraminal encroachment. 06/13/2016:  Head MRI revealed no evidence of residual or recurrent disease. 08/11/2016:  Chest, abdomen, and pelvic CT revealed  new 8 mm right supraclavicular, 12 mm right paratracheal and 10 mm subcarinal lymphadenopathy suspicious for metastatic nodal recurrence.  There was extensive patchy sclerotic osseous metastases throughout  the axial and proximal appendicular skeleton appear stable in size and distribution although generally increased in sclerosis since 02/18/2016 PET-CT, which was a nonspecific change that could reflect treatment effect or progression.  There was no additional sites of metastatic disease in the chest, abdomen or pelvis. 08/11/2016:  Bone scan revealed scattered foci of abnormal osseous tracer accumulation consistent with osseous metastatic disease with new sites of abnormal uptake in the thoracic spine at T5 in the posterior LEFT sixth rib. 08/31/2016:  Chest CT angiogram revealed interval confluent opacity in the posterior  aspect of the right upper lobe suspicious for pneumonia. There was a small right-sided pleural effusion and adjacent right lower lobe atelectasis.  Labs:  No visits with results within 3 Day(s) from this visit.  Latest known visit with results is:  Admission on 08/31/2016, Discharged on 08/31/2016  Component Date Value Ref Range Status  . Sodium 08/31/2016 133* 135 - 145 mmol/L Final  . Potassium 08/31/2016 3.8  3.5 - 5.1 mmol/L Final  . Chloride 08/31/2016 100* 101 - 111 mmol/L Final  . CO2 08/31/2016 25  22 - 32 mmol/L Final  . Glucose, Bld 08/31/2016 99  65 - 99 mg/dL Final  . BUN 08/31/2016 8  6 - 20 mg/dL Final  . Creatinine, Ser 08/31/2016 0.52  0.44 - 1.00 mg/dL Final  . Calcium 08/31/2016 9.4  8.9 - 10.3 mg/dL Final  . GFR calc non Af Amer 08/31/2016 >60  >60 mL/min Final  . GFR calc Af Amer 08/31/2016 >60  >60 mL/min Final   Comment: (NOTE) The eGFR has been calculated using the CKD EPI equation. This calculation has not been validated in all clinical situations. eGFR's persistently <60 mL/min signify possible Chronic Kidney Disease.   . Anion gap 08/31/2016 8  5 - 15 Final  . WBC 08/31/2016 10.6  3.6 - 11.0 K/uL Final  . RBC 08/31/2016 3.70* 3.80 - 5.20 MIL/uL Final  . Hemoglobin 08/31/2016 11.5* 12.0 - 16.0 g/dL Final  . HCT 08/31/2016 34.2* 35.0 - 47.0 % Final  . MCV 08/31/2016 92.4  80.0 - 100.0 fL Final  . MCH 08/31/2016 31.2  26.0 - 34.0 pg Final  . MCHC 08/31/2016 33.7  32.0 - 36.0 g/dL Final  . RDW 08/31/2016 15.3* 11.5 - 14.5 % Final  . Platelets 08/31/2016 344  150 - 440 K/uL Final  . Color, Urine 08/31/2016 YELLOW* YELLOW Final  . APPearance 08/31/2016 CLEAR* CLEAR Final  . Specific Gravity, Urine 08/31/2016 >1.046* 1.005 - 1.030 Final  . pH 08/31/2016 7.0  5.0 - 8.0 Final  . Glucose, UA 08/31/2016 NEGATIVE  NEGATIVE mg/dL Final  . Hgb urine dipstick 08/31/2016 NEGATIVE  NEGATIVE Final  . Bilirubin Urine 08/31/2016 NEGATIVE  NEGATIVE Final  . Ketones, ur  08/31/2016 20* NEGATIVE mg/dL Final  . Protein, ur 08/31/2016 NEGATIVE  NEGATIVE mg/dL Final  . Nitrite 08/31/2016 NEGATIVE  NEGATIVE Final  . Leukocytes, UA 08/31/2016 NEGATIVE  NEGATIVE Final  . RBC / HPF 08/31/2016 0-5  0 - 5 RBC/hpf Final  . WBC, UA 08/31/2016 0-5  0 - 5 WBC/hpf Final  . Bacteria, UA 08/31/2016 NONE SEEN  NONE SEEN Final  . Squamous Epithelial / LPF 08/31/2016 0-5* NONE SEEN Final    Assessment:  Melinda Tanner is a 46 y.o. 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  She initially presented in 2009 while living in [place] with multi-focal right breast cancer with positive lymph node(s) which was ER+, PR+(low),  and HER2/neu+. She received neoadjuvant chemotherapy (AC x 4 every 3 weeks followed by Taxol/Abraxane + carboplatin weekly x 12) without anti-HER2 treatment. BRCA1/2 testing was negative on 08/15/2016.  On 07/14/2008, she underwent bilateral mastectomies followed by reconstruction.  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   Zero of 11 lymph nodes on the right were positive for malignancy. 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 Deep margin was negative. Zero of 2 lymph nodes were positive for metastatic disease.  She received adjuvant radiation to the right breast.  She received adjuvant tamoxifen and Zoladex.  She could not tolerate symptoms of joint pain and tamoxifen was discontinued after 1-2 months.  Zoladex was continued for approximately 1.5 years.  She was diagnosed with brain metastases in 02/2011.  Pathology revealed ER+, PR+(low), and HER2/neu-.  She underwent resection followed by [facility].  On 03/09/2011, original breast tissue (09/03/2007) sent to [facility] testing.  Testing revealed ER+, PR+(borderline), and HER2/neu positive (different than initial testing).  She received Herceptin for 1 year beginning in 2013.  She then began Tykerb and Xeloda after completion  of Herceptin (2014).  She was on extremely low, and probably sub-therapeutic, dosing of lapatinib + capecitabine.   She developed right supraclavicular adenopathy in 2015. She was noted to have slow disease progression in the right frontal/parietal lesion with minimal presence of systemic disease on PET scans. Capecitabine + lapatinib were discontinued in 04/2014.   She underwent repeat craniotomy with resection of brain metastasis on 09/04/2014.  Biopsy of the right supraclavicular lymph node was ER+,PR+,HER2/neu-.  She started tamoxifen in 09/2014, but discontinued it secondary to pain in hip, back, and shoulder.  She restarted tamoxifen on 04/23/2015.  Tamoxifen was discontinued on 08/23/2015 secondary to progressive disease.  She received Zoladex on 04/30/2015.  She underwent laparoscopic bilateral oophorectomy on 05/18/2015.  She receives monthly Xgeva (began 04/30/2015; last 07/31/2016).  She is post-menopausal.  She began Faslodex on 08/23/2015 (last 04/04/2016).  She received 7 cycles of Ibrance (09/20/2015 - 06/18/2016).  Patient received palliative radiation 30 Gy to the left humerus and lumbar spine from 08/25/2015 - 09/09/2015.  She is not eligible for a clinical trial.  CA27.29 has been followed: 177.8 on 04/16/2015, 276.4 on 05/28/2015, 287.5 on 07/13/2015, 333.8 on 07/26/2015, 412.9 on 08/23/2015, 315.6 on 09/20/2015, 188.8 on 10/18/2015, 151.5 on 11/15/2015, 122.7 on 12/10/2015, 94.6 on 01/10/2016, 80.9 on 02/07/2016, 79 on 03/06/2016, 84.8 on 04/04/2016, 85.2 on 05/02/2016, 91.1 on 05/30/2016, and 121 on 06/30/2016.  Head MRI on 06/13/2016 revealed no evidence of residual or recurrent disease.  Chest, abdomen, and pelvic CT on 08/11/2016 revealed  new 8 mm right supraclavicular, 12 mm right paratracheal and 10 mm subcarinal lymphadenopathy suspicious for metastatic nodal recurrence.  There was extensive patchy sclerotic osseous metastases throughout the axial and proximal appendicular  skeleton appear stable in size and distribution although generally increased in sclerosis since 02/18/2016 PET-CT, which was a nonspecific change that could reflect treatment effect or progression.  There was no additional sites of metastatic disease in the chest, abdomen or pelvis.  Chest CT angiogram on 08/31/2016 revealed interval confluent opacity in the posterior aspect of the right upper lobe suspicious for pneumonia. There was a small right-sided pleural effusion and adjacent right lower lobe atelectasis.  She was treated with Levaquin.  Bone scan on 08/11/2016 revealed scattered foci of abnormal osseous tracer accumulation consistent with osseous metastatic disease with new sites of abnormal uptake in the  thoracic spine at T5 in the posterior LEFT sixth rib.  She completed palliative radiation to T12-L1 of 3000 cGy (06/21/2016 - 07/04/2016).  She received palliative radiation to the left hip from 08/17/2016 - 08/30/2016.  She is on a Fentanyl 25 mcg/hr patch.  Pain is well controlled.  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 Tumor was ER positive (> 90%) PR negative and HER-2/neu negative (1+ IHC).    Xeloda is cost prohibitive.  Bone density study on 05/24/2015 revealed a T-score of -0.8 in the right femoral neck (normal).  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  Work-up on 10/25/2015 revealed a B12 of 141 (low).  B12 was 205 on 12/13/2015 with an MMA of 171 (normal) on 01/10/2016.  Ferritin was 43. Iron studies included a saturation of 13% and a TIBC of 354. TSH and folate were normal.  Reticulocyte count was 2.2%.  She declined B12 injections.  She started oral B12 1000 mcg on 11/05/2015.  She takes her B12 sporadically.  B12 level was 205 (low normal) on 12/13/2015.  Diet is good.  She denies any melena or hematochezia.  Symptomatically, she has a  persistent cough following treatment for pneumonia.  Exam reveals decreased breath sounds in the RLL (? effusion or elevated hemidiaphragm).  Plan: 1.  Discuss results of BRCA1/2 testing- negative. 2.  Discuss patient's thoughts about treatment.  Discuss prohibitive cost of Xeloda.  Discuss other treatment options including single agent gemcitabine, Abraxane, navelbine, and [place].  Discuss everolimus + Aromasin.  Side effects reviewed in detail.  Patient not interested in everolimus + Aromasin given possible pneumonitis with everolimus.  Information provided on single agent chemotherapy drugs.  Patient to consider with plan for short term follow-up and initiation of therapy. 3.  Discuss patient's concern for persistent cough.  Obtain CXR. 4.  CXR (PA and lateral) today. 5.  Anticipate head MRI in near future (due 09/13/2016). 6.  Rx:  Protonix. 7.  RTC on 10/09/2016 for MD assessment and decision regarding direction of therapy.  Addendum:  CXR today revealed resolved right-sided pleural effusion.  There was persistent right hemidiaphragm elevation.   Over 40 minutes were spent with the patient today.   Lequita Asal, MD  09/29/2016, 12:52 PM

## 2016-09-29 NOTE — Progress Notes (Signed)
Patient states her appetite has not been good.  She is taking Centrum vitamins and drinking ensure.  Patient states she has a lot of coughing at night with increased saliva.  Also states she has had pain in her right shoulder.  She also c/o pain in her upper back that feels like muscular pain.  Patient also states she has felt an area under the left breast that she calls a mole.  She states she is monitoring it.

## 2016-10-02 ENCOUNTER — Encounter: Payer: Self-pay | Admitting: Hematology and Oncology

## 2016-10-02 ENCOUNTER — Inpatient Hospital Stay (HOSPITAL_BASED_OUTPATIENT_CLINIC_OR_DEPARTMENT_OTHER): Payer: Medicare HMO | Admitting: Hematology and Oncology

## 2016-10-02 VITALS — BP 107/72 | HR 102 | Temp 98.7°F | Resp 18 | Wt 150.5 lb

## 2016-10-02 DIAGNOSIS — C7951 Secondary malignant neoplasm of bone: Secondary | ICD-10-CM

## 2016-10-02 DIAGNOSIS — C50919 Malignant neoplasm of unspecified site of unspecified female breast: Secondary | ICD-10-CM

## 2016-10-02 DIAGNOSIS — Z79811 Long term (current) use of aromatase inhibitors: Secondary | ICD-10-CM

## 2016-10-02 DIAGNOSIS — Z170 Estrogen receptor positive status [ER+]: Secondary | ICD-10-CM

## 2016-10-02 DIAGNOSIS — C50911 Malignant neoplasm of unspecified site of right female breast: Secondary | ICD-10-CM

## 2016-10-02 DIAGNOSIS — C7931 Secondary malignant neoplasm of brain: Secondary | ICD-10-CM | POA: Diagnosis not present

## 2016-10-02 DIAGNOSIS — Z9013 Acquired absence of bilateral breasts and nipples: Secondary | ICD-10-CM | POA: Diagnosis not present

## 2016-10-02 DIAGNOSIS — Z923 Personal history of irradiation: Secondary | ICD-10-CM | POA: Diagnosis not present

## 2016-10-02 DIAGNOSIS — Z7189 Other specified counseling: Secondary | ICD-10-CM

## 2016-10-02 DIAGNOSIS — D0512 Intraductal carcinoma in situ of left breast: Secondary | ICD-10-CM | POA: Diagnosis not present

## 2016-10-02 DIAGNOSIS — D649 Anemia, unspecified: Secondary | ICD-10-CM | POA: Diagnosis not present

## 2016-10-02 NOTE — Progress Notes (Signed)
Patient states she awakened this morning "feeling like she had been beat up overnight". States she had generalized all over pain. She took a pain pill and states she is much better.

## 2016-10-02 NOTE — Progress Notes (Signed)
[facility] day:  10/02/16   Chief Complaint: Melinda Tanner is a 46 y.o. 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   HPI:  The patient was last seen in the medical oncology clinic on 09/29/2016.  At that time, palliative radiation was complete.  She had not started Xeloda secondary to cost issues.  We discussed other treatment options.  Information was provided on Abraxane, [place], navelbine, and gemcitabine.   She had a persistent cough after treatment of pneumonia.  CXR revealed resolved right-sided pleural effusion.  There was persistent right hemidiaphragm elevation.  Symptomatically, she notes generalized pain.  She is taking ibuprofen.  She denies any cough.  She has decided not to pursue navelbine and gemcitabine secondary to out of pocket costs.  She is interested in Abraxane, but wishes to postpone secondary to payment issues (bills).   Past Medical History:  Diagnosis Date  . Brain cancer ([place]) 2012   Met. from Breast  . Breast cancer ([place]) 2009  . Complication of anesthesia    nausea, "drops in potassium and magnesium"  . Seizures ([place])     Past Surgical History:  Procedure Laterality Date  . BRAIN SURGERY  2012, 2106  . CESAREAN SECTION    . LAPAROSCOPIC BILATERAL SALPINGO OOPHERECTOMY Bilateral 05/18/2015   Procedure: LAPAROSCOPIC BILATERAL SALPINGO OOPHORECTOMY;  Surgeon: Will Bonnet, MD;  Location: [facility] ORS;  Service: Gynecology;  Laterality: Bilateral;  . MASTECTOMY Bilateral 2010    Family History  Problem Relation Age of Onset  . Cancer Paternal Aunt   . Cancer Paternal Uncle   . Cancer Paternal Grandfather   . Hypertension Brother   . Diabetes Paternal Grandmother   A paternal aunt had breast cancer age 82, a paternal uncle had prostate cancer, and a paternal grandfather had leukemia.  Social  History:  reports that she has never smoked. She has never used smokeless tobacco. She reports that she does not drink alcohol or use drugs.  She is from [place].  She moved to [place] in 2015.  She moved to [place] in 04/2014.  She recently moved into the [place] area.  She has 2 children (boy and girl).  Her family will likely be moving to [facility] in December.  They are looking for a home.  The patient is accompanied by the Spanish interpreter today.  Allergies:  Allergies  Allergen Reactions  . Taxol [Paclitaxel] Anaphylaxis  . Aspirin Swelling  . Penicillins Swelling  . Zofran [Ondansetron Hcl] Nausea And Vomiting    Current Medications: Current Outpatient Prescriptions  Medication Sig Dispense Refill  . Cyanocobalamin (VITAMIN B-12 PO) Take by mouth.    . fentaNYL (DURAGESIC - DOSED MCG/HR) 25 MCG/HR patch Place 1 patch (25 mcg total) onto the skin every 3 (three) days. 10 patch 0  . ibuprofen (ADVIL,MOTRIN) 800 MG tablet Take 800 mg by mouth every 8 (eight) hours as needed (Takes 1/2 tablet prn).    . Multiple Vitamins-Minerals (CENTRUM ADULTS PO) Take 1 tablet by mouth daily.    Marland Kitchen omeprazole (PRILOSEC) 20 MG capsule Take 20 mg by mouth daily.  2  . pantoprazole (PROTONIX) 20 MG tablet Take 1 tablet by mouth daily 30 tablet 3  . traMADol (ULTRAM) 50 MG tablet Take 50 mg by mouth every 12 (twelve) hours as needed.    . capecitabine (XELODA) 150 MG tablet  Take 1 tablet (150 mg total) by mouth 2 (two) times daily after a meal. Take with 500 mg prescription (Patient not taking: Reported on 09/29/2016) 28 tablet 0  . capecitabine (XELODA) 500 MG tablet Take 3 tablets (1,500 mg total) by mouth 2 (two) times daily after a meal. Take with 150 mg prescription (Patient not taking: Reported on 09/29/2016) 84 tablet 0  . docusate sodium (COLACE) 100 MG capsule Take 1 tablet once or twice daily as needed for constipation while taking narcotic pain medicine (Patient not taking:  Reported on 09/29/2016) 30 capsule 0  . HYDROcodone-acetaminophen (NORCO/VICODIN) 5-325 MG tablet Take 1-2 tablets by mouth every 4 (four) hours as needed for moderate pain. (Patient not taking: Reported on 09/25/2016) 30 tablet 0  . oxycodone (OXY-IR) 5 MG capsule Take 1 capsule (5 mg total) by mouth every 4 (four) hours as needed. (Patient not taking: Reported on 09/25/2016) 30 capsule 0  . oxyCODONE-acetaminophen (PERCOCET/ROXICET) 5-325 MG tablet Take 1 tablet by mouth every 4 (four) hours as needed for severe pain. (Patient not taking: Reported on 09/25/2016) 40 tablet 0  . palbociclib (IBRANCE) 100 MG capsule Take 1 capsule daily for 21 days and then take 1 week off and repeat cycle again (Take whole with food.) (Patient not taking: Reported on 09/25/2016) 21 capsule 1  . potassium chloride SA (K-DUR,KLOR-CON) 20 MEQ tablet Take 1 tablet (20 mEq total) by mouth 2 (two) times daily. for 1 days then 1 pill a day x 2 days. (Patient not taking: Reported on 09/29/2016) 10 tablet 0  . promethazine (PHENERGAN) 25 MG tablet Take 1 tablet (25 mg total) by mouth every 6 (six) hours as needed for nausea or vomiting. (Patient not taking: Reported on 09/29/2016) 30 tablet 2   No current facility-administered medications for this visit.     Review of Systems:  GENERAL:  Feels "pk".  No fevers or sweats.  Weight stable. PERFORMANCE STATUS (ECOG):  1 HEENT:  No visual changes, runny nose, sore throat, mouth sores or tenderness. Lungs: No shortness of breath or cough.  No hemoptysis. Cardiac:  No chest pain, palpitations, orthopnea, or PND. GI:  Epigastric discomfort improved with Protonix.  No diarrhea, constipation, melena or hematochezia.  GU:  No urgency, frequency, dysuria or hematuria. Musculoskeletal: Diffuse pain.  No muscle tenderness. Extremities:  No pain or swelling. Skin:  No rashes or irritation. Neuro:  No headache, numbness or weakness, balance or coordination issues.  Endocrine:  No diabetes,  thyroid issues, or night sweats.  Hot flashes. Psych:  Tearful.  No mood changes, depression or anxiety. Pain:  Diffuse pain, improved with ibuprofen (see HPI). Review of systems:  All other systems reviewed and found to be negative.  Physical Exam: Blood pressure 107/72, pulse (!) 102, temperature 98.7 F (37.1 C), temperature source Tympanic, resp. rate 18, weight 150 lb 8 oz (68.3 kg), last menstrual period 03/19/2015. GENERAL:  Well developed, well nourished, woman sitting comfortably in the exam room in no acute distress. She is tearful at times. MENTAL STATUS:  Alert and oriented to person, place and time. HEAD: Wearing a scarf.  Black short hair.  Normocephalic, atraumatic, face symmetric, no Cushingoid features. EYES:  Brown eyes. No conjunctivitis or scleral icterus. NEUROLOGICAL:  Appropriate. PSYCH:  Appropriate.     Pathology: 09/03/2007: Right breast core biopsy: infiltrating duct cell carcinoma high grade ER/PR (reportedly positive) Her2 (?) 10/02/2007: Right axillary node core biopsy: Fragment with metastatic carcinoma 10/23/2007: Left breast core biopsy: DCIS high grade  ER/PR (?) 07/13/2008: Right mastectomy: Invasive ductal carcinoma Grade III Nottingham score = tubules 3+ Nuclei 3+ mitoses 2+) 1.7 cm size. No lymph invasion. Tumor cellularity 20%. [id number]. 07/13/2008: Left mastectomy: Residual ductal carcinoma in situ, high nuclear grade, deep margin negative ypTisNO(sn)MX. 10/25/2010: Abdominal and pelvic ultrasound: questionable lesion near gallbladder fossa recommend MRI follow up. 03/02/2011: Left and right frontal excisional brain biopsy: Adenocarcinoma, metastatic, NOS: ER +, PR 5% +, Her 2 NEG. 03/04/2011: [facility] testing of IHC4 residual recurrence risk score of 114 showing an 8 year recurrence reate of 57%. ER POSTIVE, PR POSITIVE, HER 2 POSTIVE (previously documented negative in 2009) cutoff of 361 patient scores 426. ki67 71%.  08/05/2013: Right breast FNA  supraclavicular region: positive for metastatic carcinoma. ER/PR/HER2 not reported. 08/19/2014: Right cervical lymph node. Adenocarcinoma with features consistent with metastatic breast carcinoma. ER/PR/HER2 insufficient tissue. 09/04/2014: Repeat craniotomy, resection of right frontal tumor (metastatic breast cancer). ER 90-100% PR 51-60% HER2 - 09/29/2014: Right cervical lymph node, metastatic carcinoma c/w breast primary, ER 100% PR 20% HER2-  Imaging: 08/15/2007: Bilateral Mammogram: Dense breasts with solid lesion at 6:00, 7:00, and 9:00 of right breast, solid lesion at 2:00 position left breast BIRADS 4B. 09/10/2007: Breast MRI: Three solid irregular enhancing lesions of the right breast 2.1 x 3.2, 2.8 x 2.7, and 1.8 x 1.6 cm. Mildly enlarged enhancing nodule right axilla. Left breast clumped enhancement midportion suspicious for malignancy. 03/01/2011: Brain MRI: At least two juxtacortical, intra-axial masses with extensive surrounding vasogenic edema most consistent with intracranial metastasis. 07/22/2013: Brain MRI +/- contrast: interval increase in enhancing component on T2 FLAIR now measuring 1.0cm x 1.1 cm worrisome for progression of disease. 01/21/2014:  PET scan: Hypermetabolic small right supraclavicular and right upper paratracheal lymph nodes suspicious for mets. Hypermetabolic lymph node in the right paratracheal upper mediastinum that measure approximately 0.9 x 0.5cm. (of note no impressions made of areas noted on brain MRI 07/22/13).  01/21/2014: Head MRI: Right frontal enhancing lesion has shown interval increase in size with surrounding edema and local mass effect (measured 1.8x2.1x1.6, previously 1.2x1.1x1.0) 07/16/2014: Head MRI:  Right anterior frontal enhancing mass 2.5 x 2.0 cm compatible with a metastatic lesion has increased and changed in configuration from previous 2.0 x 1.8 cm, formerly multilobular. 08/06/2014: PET scan:  Multiple bilateral FDG avid low cervical,  supraclavicular, upper mediastinal, and right internal mammary lymphadenopathy, likely representing metastatic disease. A cervical node in the right lower neck should be amenable to percutaneous sampling.  12/2014: Head MRI:  Evolution of right frontal postoperative changes status post tumor resection at the vertex. Expected postoperative changes. Minimal marginal enhancement resection bed with some adjacent posterior intrinsic T1 signal again seen. Decreasing T2/FLAIR adjacent parenchymal changes.  Stable resection cavity is left posterior frontal and right parieto-occipital regions. 04/20/2015 : PET scan: Cervical and thoracic nodal metastasis.  There was multifocal osseous metastasis (left transverse T4 process, left humeral head, right iliac wing, and left side of the sacrum).  Incidental findings included right nephrolithiasis.   04/22/2015: Bone scan: Corresponded with PET-CT with metastatic lesions evident in the left humeral head, left posterior aspect of T4, left aspect of S1, and the right iliac crest.  There was no other definite foci of metastatic disease to bone.  Uptake in the skull was most suggestive of the previous right frontal craniotomy. 05/24/2015: Bone density:  T-score of -0.8 in the right femoral neck (normal). 07/07/2015:  Head MRI:  Metastatic breast cancer with 3 resection cavities. The anterior right frontal cavity  was positive for nodular marginal enhancement, new from brain MRI report 01/07/2015, and consistent with recurrent disease.  08/12/2015:  Lumbar Spine MRI: Osseous metastatic disease at L3, L4, L5, S1, and in the left iliac bone posteriorly. There was no definite epidural metastatic disease. 08/20/2015:  PET scan:  Response to therapy in the lower cervical and thoracic adenopathy.  There was a mixed response to multifocal osseous metastases (some new lesions).  12/06/2015:  Head MRI:  Decreased nodular contrast enhancement at the right frontal lobe treatment site.   There was unchanged appearance of treatment sites within the bilateral parietal lobe size without residual or recurrent disease.  There were no new metastatic lesions. 02/18/2016: PET scan: Mixed response to therapy. There hadbeen improvement in right cervical lymphadenopathy and in several osseous lesions. There wereseveral other osseous lesions in the pelvis with increasing hypermetabolism compared to the prior study. As the majority of the osseous lesions wereincreasingly sclerotic, some of this hypermetabolism could simply reflect bony healing. There was no new extraskeletal metastatic disease is noted in the neck, chest, abdomen or pelvis. 03/06/2016: Head MRI: Unchanged small focus of nodular enhancement at the right frontal resection site. There was unchanged appearance of the 2 other resection sites without abnormal enhancement. There was no evidence of new intracranial metastases. 05/23/2016:  Bone scan:  Stable focus of abnormal uptake seen in right frontal skull consistent with prior craniotomy.  There were areas of abnormal uptake identified in the left humeral head, T4 and S1 levels and right iliac crest that were present but decreased in intensity compared to prior exam suggesting improvement.  There was a new foci of abnormal uptake are noted in a right lower rib,T12 and L1 levels of spine concerning for metastatic disease. 06/07/2016:  Thoracic and lumbar spine MRI:  multiple low T1/T2 weighted signal, non enhancing lesions within the thoracic spine, which corresponded in size and location to sclerotic lesions demonstrated on the PET CT of 02/18/2016.  There were no new thoracic lesions.  There was minimal residual contrast enhancement within the L5 vertebral body metastatic lesion, decreased compared to the MRI of 08/12/2015.  There was minimal contrast enhancement at the periphery of the lesion within the L1 vertebral body. This lesion was new compared to MRI of 08/12/2015, but  unchanged in size and location compared to PET CT of 02/18/2016.  There was no epidural disease, spinal canal stenosis or neural foraminal encroachment. 06/13/2016:  Head MRI revealed no evidence of residual or recurrent disease. 08/11/2016:  Chest, abdomen, and pelvic CT revealed  new 8 mm right supraclavicular, 12 mm right paratracheal and 10 mm subcarinal lymphadenopathy suspicious for metastatic nodal recurrence.  There was extensive patchy sclerotic osseous metastases throughout the axial and proximal appendicular skeleton appear stable in size and distribution although generally increased in sclerosis since 02/18/2016 PET-CT, which was a nonspecific change that could reflect treatment effect or progression.  There was no additional sites of metastatic disease in the chest, abdomen or pelvis. 08/11/2016:  Bone scan revealed scattered foci of abnormal osseous tracer accumulation consistent with osseous metastatic disease with new sites of abnormal uptake in the thoracic spine at T5 in the posterior LEFT sixth rib. 08/31/2016:  Chest CT angiogram revealed interval confluent opacity in the posterior aspect of the right upper lobe suspicious for pneumonia. There was a small right-sided pleural effusion and adjacent right lower lobe atelectasis.  Labs:  No visits with results within 3 Day(s) from this visit.  Latest known visit with results  is:  Admission on 08/31/2016, Discharged on 08/31/2016  Component Date Value Ref Range Status  . Sodium 08/31/2016 133* 135 - 145 mmol/L Final  . Potassium 08/31/2016 3.8  3.5 - 5.1 mmol/L Final  . Chloride 08/31/2016 100* 101 - 111 mmol/L Final  . CO2 08/31/2016 25  22 - 32 mmol/L Final  . Glucose, Bld 08/31/2016 99  65 - 99 mg/dL Final  . BUN 08/31/2016 8  6 - 20 mg/dL Final  . Creatinine, Ser 08/31/2016 0.52  0.44 - 1.00 mg/dL Final  . Calcium 08/31/2016 9.4  8.9 - 10.3 mg/dL Final  . GFR calc non Af Amer 08/31/2016 >60  >60 mL/min Final  . GFR calc Af Amer  08/31/2016 >60  >60 mL/min Final   Comment: (NOTE) The eGFR has been calculated using the CKD EPI equation. This calculation has not been validated in all clinical situations. eGFR's persistently <60 mL/min signify possible Chronic Kidney Disease.   . Anion gap 08/31/2016 8  5 - 15 Final  . WBC 08/31/2016 10.6  3.6 - 11.0 K/uL Final  . RBC 08/31/2016 3.70* 3.80 - 5.20 MIL/uL Final  . Hemoglobin 08/31/2016 11.5* 12.0 - 16.0 g/dL Final  . HCT 08/31/2016 34.2* 35.0 - 47.0 % Final  . MCV 08/31/2016 92.4  80.0 - 100.0 fL Final  . MCH 08/31/2016 31.2  26.0 - 34.0 pg Final  . MCHC 08/31/2016 33.7  32.0 - 36.0 g/dL Final  . RDW 08/31/2016 15.3* 11.5 - 14.5 % Final  . Platelets 08/31/2016 344  150 - 440 K/uL Final  . Color, Urine 08/31/2016 YELLOW* YELLOW Final  . APPearance 08/31/2016 CLEAR* CLEAR Final  . Specific Gravity, Urine 08/31/2016 >1.046* 1.005 - 1.030 Final  . pH 08/31/2016 7.0  5.0 - 8.0 Final  . Glucose, UA 08/31/2016 NEGATIVE  NEGATIVE mg/dL Final  . Hgb urine dipstick 08/31/2016 NEGATIVE  NEGATIVE Final  . Bilirubin Urine 08/31/2016 NEGATIVE  NEGATIVE Final  . Ketones, ur 08/31/2016 20* NEGATIVE mg/dL Final  . Protein, ur 08/31/2016 NEGATIVE  NEGATIVE mg/dL Final  . Nitrite 08/31/2016 NEGATIVE  NEGATIVE Final  . Leukocytes, UA 08/31/2016 NEGATIVE  NEGATIVE Final  . RBC / HPF 08/31/2016 0-5  0 - 5 RBC/hpf Final  . WBC, UA 08/31/2016 0-5  0 - 5 WBC/hpf Final  . Bacteria, UA 08/31/2016 NONE SEEN  NONE SEEN Final  . Squamous Epithelial / LPF 08/31/2016 0-5* NONE SEEN Final    Assessment:  Melinda Tanner is a 46 y.o. 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  She initially presented in 2009 while living in [place] with multi-focal right breast cancer with positive lymph node(s) which was ER+, PR+(low), and HER2/neu+. She received neoadjuvant chemotherapy (AC x 4 every 3 weeks followed by Taxol/Abraxane + carboplatin weekly x 12) without anti-HER2  treatment. BRCA1/2 testing was negative on 08/15/2016.  On 07/14/2008, she underwent bilateral mastectomies followed by reconstruction.  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   Zero of 11 lymph nodes on the right were positive for malignancy. 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 Deep margin was negative. Zero of 2 lymph nodes were positive for metastatic disease.  She received adjuvant radiation to the right breast.  She received adjuvant tamoxifen and Zoladex.  She could not tolerate symptoms of joint pain and tamoxifen was discontinued after 1-2 months.  Zoladex was continued for approximately 1.5 years.  She was diagnosed with brain  metastases in 02/2011.  Pathology revealed ER+, PR+(low), and HER2/neu-.  She underwent resection followed by [facility].  On 03/09/2011, original breast tissue (09/03/2007) sent to [facility] testing.  Testing revealed ER+, PR+(borderline), and HER2/neu positive (different than initial testing).  She received Herceptin for 1 year beginning in 2013.  She then began Tykerb and Xeloda after completion of Herceptin (2014).  She was on extremely low, and probably sub-therapeutic, dosing of lapatinib + capecitabine.   She developed right supraclavicular adenopathy in 2015. She was noted to have slow disease progression in the right frontal/parietal lesion with minimal presence of systemic disease on PET scans. Capecitabine + lapatinib were discontinued in 04/2014.   She underwent repeat craniotomy with resection of brain metastasis on 09/04/2014.  Biopsy of the right supraclavicular lymph node was ER+,PR+,HER2/neu-.  She started tamoxifen in 09/2014, but discontinued it secondary to pain in hip, back, and shoulder.  She restarted tamoxifen on 04/23/2015.  Tamoxifen was discontinued on 08/23/2015 secondary to progressive disease.  She received Zoladex on 04/30/2015.  She underwent laparoscopic bilateral  oophorectomy on 05/18/2015.  She receives monthly Xgeva (began 04/30/2015; last 07/31/2016).  She is post-menopausal.  She began Faslodex on 08/23/2015 (last 04/04/2016).  She received 7 cycles of Ibrance (09/20/2015 - 06/18/2016).  Patient received palliative radiation 30 Gy to the left humerus and lumbar spine from 08/25/2015 - 09/09/2015.  She is not eligible for a clinical trial.  CA27.29 has been followed: 177.8 on 04/16/2015, 276.4 on 05/28/2015, 287.5 on 07/13/2015, 333.8 on 07/26/2015, 412.9 on 08/23/2015, 315.6 on 09/20/2015, 188.8 on 10/18/2015, 151.5 on 11/15/2015, 122.7 on 12/10/2015, 94.6 on 01/10/2016, 80.9 on 02/07/2016, 79 on 03/06/2016, 84.8 on 04/04/2016, 85.2 on 05/02/2016, 91.1 on 05/30/2016, 121 on 06/30/2016, and 182.2 on 07/31/2016.  Head MRI on 06/13/2016 revealed no evidence of residual or recurrent disease.  Chest, abdomen, and pelvic CT on 08/11/2016 revealed  new 8 mm right supraclavicular, 12 mm right paratracheal and 10 mm subcarinal lymphadenopathy suspicious for metastatic nodal recurrence.  There was extensive patchy sclerotic osseous metastases throughout the axial and proximal appendicular skeleton appear stable in size and distribution although generally increased in sclerosis since 02/18/2016 PET-CT, which was a nonspecific change that could reflect treatment effect or progression.  There was no additional sites of metastatic disease in the chest, abdomen or pelvis.  Chest CT angiogram on 08/31/2016 revealed interval confluent opacity in the posterior aspect of the right upper lobe suspicious for pneumonia. There was a small right-sided pleural effusion and adjacent right lower lobe atelectasis.  She was treated with Levaquin.  Bone scan on 08/11/2016 revealed scattered foci of abnormal osseous tracer accumulation consistent with osseous metastatic disease with new sites of abnormal uptake in the thoracic spine at T5 in the posterior LEFT sixth rib.  Bone density  study on 05/24/2015 revealed a T-score of -0.8 in the right femoral neck (normal).  She completed palliative radiation to T12-L1 of 3000 cGy (06/21/2016 - 07/04/2016).  She began palliative radiation to the left hip from 08/17/2016 - 08/30/2016.  She is on a Fentanyl 25 mcg/hr patch.  Pain is well controlled.  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 Tumor was ER positive (> 90%) PR negative and HER-2/neu negative (1+ IHC).    Xeloda is cost prohibitive.  She does not wish to pursue navelbine or gemcitabine.  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  Work-up on 10/25/2015 revealed a  B12 of 141 (low).  B12 was 205 on 12/13/2015 with an MMA of 171 (normal) on 01/10/2016.  Ferritin was 43. Iron studies included a saturation of 13% and a TIBC of 354. TSH and folate were normal.  Reticulocyte count was 2.2%.  She declined B12 injections.  She started oral B12 1000 mcg on 11/05/2015.  She takes her B12 sporadically.  B12 level was 205 (low normal) on 12/13/2015.  Diet is good.  She denies any melena or hematochezia.  Symptomatically, she has generalized bone pain relieved with ibuprofen.  She is concerned about continuing therapy because of cost issues.  Plan: 1.  Discuss patient's thoughts about therapy. She stated that she did not wish to pursue navelbine or gemcitabine as it was not covered by her insurance. She was interested in Abraxane but did not feel that she can pursue treatment at this time given her bills. She is overwhelmed. She does not want to burden her family with cost of treatment after her death.  We discussed obtaining assistance.  She will talk to Elease Etienne, Education officer, museum, today. [place], social worker, to talk to patient today. 3.  Preauth Abraxane 4.  Anticipate reinitiation of Xgeva (last received 07/31/2016). 5.  Anticipate head MRI  in near future (due 09/13/2016). 6.  RTC in 1 week for MD assessment, labs (CBC with diff, CMP, Mg, CA27.29) and week #1 Abraxane.   Lequita Asal, MD  10/02/2016, 3:01 PM

## 2016-10-08 ENCOUNTER — Other Ambulatory Visit: Payer: Self-pay | Admitting: Hematology and Oncology

## 2016-10-08 ENCOUNTER — Encounter: Payer: Self-pay | Admitting: Hematology and Oncology

## 2016-10-08 DIAGNOSIS — Z5111 Encounter for antineoplastic chemotherapy: Secondary | ICD-10-CM | POA: Insufficient documentation

## 2016-10-08 NOTE — Progress Notes (Signed)
[facility] day:  10/09/16   Chief Complaint: Melinda Tanner is a 46 y.o. 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   HPI:  The patient was last seen in the medical oncology clinic on 10/02/2016.  At that time, decision was made to pursue Abraxane.  She met with Elease Etienne, social worker.  She had generalized achiness relieved with ibuprofen.  During the interim, she has had a little pain on her left side.  She is taking ibuprofen.  She has a little headache.  She states that previously, she took Reglan and Decadron as a premed.  Phenergan works well.  She comments that she is going on a trip to [place] 10/18/2016 - 10/24/2016.   Past Medical History:  Diagnosis Date  . Brain cancer ([place]) 2012   Met. from Breast  . Breast cancer ([place]) 2009  . Complication of anesthesia    nausea, "drops in potassium and magnesium"  . Seizures ([place])     Past Surgical History:  Procedure Laterality Date  . BRAIN SURGERY  2012, 2106  . CESAREAN SECTION    . LAPAROSCOPIC BILATERAL SALPINGO OOPHERECTOMY Bilateral 05/18/2015   Procedure: LAPAROSCOPIC BILATERAL SALPINGO OOPHORECTOMY;  Surgeon: Will Bonnet, MD;  Location: [facility] ORS;  Service: Gynecology;  Laterality: Bilateral;  . MASTECTOMY Bilateral 2010    Family History  Problem Relation Age of Onset  . Cancer Paternal Aunt   . Cancer Paternal Uncle   . Cancer Paternal Grandfather   . Hypertension Brother   . Diabetes Paternal Grandmother   A paternal aunt had breast cancer age 4, a paternal uncle had prostate cancer, and a paternal grandfather had leukemia.  Social History:  reports that she has never smoked. She has never used smokeless tobacco. She reports that she does not drink alcohol or use drugs.  She is from [place].  She moved to [place] in 2015.  She moved to [place] in 04/2014.   She recently moved into the [place] area.  She has 2 children (boy and girl).  Her family will likely be moving to [facility] in December.  They are looking for a home.  She is going on a trip to [place] 10/18/2016 - 10/24/2016.  The patient is accompanied by the Spanish interpreter today.  Allergies:  Allergies  Allergen Reactions  . Taxol [Paclitaxel] Anaphylaxis  . Aspirin Swelling  . Penicillins Swelling  . Zofran [Ondansetron Hcl] Nausea And Vomiting    Current Medications: Current Outpatient Prescriptions  Medication Sig Dispense Refill  . Cyanocobalamin (VITAMIN B-12 PO) Take by mouth.    . fentaNYL (DURAGESIC - DOSED MCG/HR) 25 MCG/HR patch Place 1 patch (25 mcg total) onto the skin every 3 (three) days. 10 patch 0  . ibuprofen (ADVIL,MOTRIN) 800 MG tablet Take 800 mg by mouth every 8 (eight) hours as needed (Takes 1/2 tablet prn).    . Multiple Vitamins-Minerals (CENTRUM ADULTS PO) Take 1 tablet by mouth daily.    Marland Kitchen omeprazole (PRILOSEC) 20 MG capsule Take 20 mg by mouth daily.  2  . pantoprazole (PROTONIX) 20 MG tablet Take 1 tablet by mouth daily 30 tablet 3  . traMADol (ULTRAM) 50 MG tablet Take 50 mg by mouth every 12 (twelve) hours as needed.    . capecitabine (XELODA) 150 MG tablet Take 1 tablet (150 mg total) by mouth 2 (two)  times daily after a meal. Take with 500 mg prescription (Patient not taking: Reported on 09/29/2016) 28 tablet 0  . capecitabine (XELODA) 500 MG tablet Take 3 tablets (1,500 mg total) by mouth 2 (two) times daily after a meal. Take with 150 mg prescription (Patient not taking: Reported on 09/29/2016) 84 tablet 0  . docusate sodium (COLACE) 100 MG capsule Take 1 tablet once or twice daily as needed for constipation while taking narcotic pain medicine (Patient not taking: Reported on 09/29/2016) 30 capsule 0  . HYDROcodone-acetaminophen (NORCO/VICODIN) 5-325 MG tablet Take 1-2 tablets by mouth every 4 (four) hours as needed for moderate pain. (Patient  not taking: Reported on 09/25/2016) 30 tablet 0  . oxycodone (OXY-IR) 5 MG capsule Take 1 capsule (5 mg total) by mouth every 4 (four) hours as needed. (Patient not taking: Reported on 09/25/2016) 30 capsule 0  . oxyCODONE-acetaminophen (PERCOCET/ROXICET) 5-325 MG tablet Take 1 tablet by mouth every 4 (four) hours as needed for severe pain. (Patient not taking: Reported on 09/25/2016) 40 tablet 0  . palbociclib (IBRANCE) 100 MG capsule Take 1 capsule daily for 21 days and then take 1 week off and repeat cycle again (Take whole with food.) (Patient not taking: Reported on 09/25/2016) 21 capsule 1  . potassium chloride SA (K-DUR,KLOR-CON) 20 MEQ tablet Take 1 tablet (20 mEq total) by mouth 2 (two) times daily. for 1 days then 1 pill a day x 2 days. (Patient not taking: Reported on 09/29/2016) 10 tablet 0  . promethazine (PHENERGAN) 25 MG tablet Take 1 tablet (25 mg total) by mouth every 6 (six) hours as needed for nausea or vomiting. (Patient not taking: Reported on 09/29/2016) 30 tablet 2   No current facility-administered medications for this visit.     Review of Systems:  GENERAL:  Feels "the same".  No fevers or sweats.  Weight stable. PERFORMANCE STATUS (ECOG):  1 HEENT:  No visual changes, runny nose, sore throat, mouth sores or tenderness. Lungs: No shortness of breath or cough.  No hemoptysis. Cardiac:  No chest pain, palpitations, orthopnea, or PND. GI:  Epigastric discomfort on Protonix.  No diarrhea, constipation, melena or hematochezia.  GU:  No urgency, frequency, dysuria or hematuria. Musculoskeletal: Diffuse pain, mostly on left side.  No muscle tenderness. Extremities:  No pain or swelling. Skin:  No rashes or irritation. Neuro:  No headache, numbness or weakness, balance or coordination issues.  Endocrine:  No diabetes, thyroid issues, or night sweats.  Hot flashes. Psych:  No mood changes, depression or anxiety. Pain:  Diffuse pain, improved with ibuprofen (see HPI). Review of systems:   All other systems reviewed and found to be negative.  Physical Exam: Blood pressure 114/79, pulse (!) 116, temperature (!) 97.2 F (36.2 C), temperature source Tympanic, weight 150 lb 1 oz (68.1 kg), last menstrual period 03/19/2015. GENERAL:  Well developed, well nourished, woman sitting comfortably in the exam room in no acute distress. She is tearful at times. MENTAL STATUS:  Alert and oriented to person, place and time. HEAD: Wearing a scarf.  Short black hair.  Normocephalic, atraumatic, face symmetric, no Cushingoid features. EYES:  Brown eyes. Pupils equal round and reactive to light and accomodation.  No conjunctivitis or scleral icterus. ENT:  Oropharynx clear without lesion.  Tongue normal. Mucous membranes moist.  RESPIRATORY:  Decreased breath sounds right sided 1/3 way up.  Otherwise, clear to auscultation without rales, wheezes or rhonchi. CARDIOVASCULAR:  Regular rate and rhythm without murmur, rub or gallop. ABDOMEN:  Soft, non-tender, with active bowel sounds, and no hepatosplenomegaly.  No masses. SKIN:  No rashes, bruises or ulcers. EXTREMITIES:  No edema, no skin discoloration or tenderness.  No palpable cords. LYMPH NODES:  No palpable cervical, supraclavicular, axillary or inguinal adenopathy  NEUROLOGICAL:  Appropriate. PSYCH:  Appropriate.     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 No lymph invasion. Tumor cellularity 20%. [id number]. 07/13/2008: Left mastectomy: Residual ductal carcinoma in situ, high nuclear grade, deep margin negative ypTisNO(sn)MX. 10/25/2010: Abdominal and pelvic ultrasound: questionable  lesion near gallbladder fossa recommend MRI follow up. 03/02/2011: Left and right frontal excisional brain biopsy: Adenocarcinoma, metastatic, NOS: ER +, PR 5% +, Her 2 NEG. 03/04/2011: [facility] testing of IHC4 residual recurrence risk score of 114 showing an 8 year recurrence reate of 57%. ER POSTIVE, PR POSITIVE, HER 2 POSTIVE (previously documented negative in 2009) cutoff of 361 patient scores 426. ki67 71%.  08/05/2013: Right breast FNA supraclavicular region: positive for metastatic carcinoma. ER/PR/HER2 not reported. 08/19/2014: Right cervical lymph node. Adenocarcinoma with features consistent with metastatic breast carcinoma. ER/PR/HER2 insufficient tissue. 09/04/2014: Repeat craniotomy, resection of right frontal tumor (metastatic breast cancer). ER 90-100% PR 51-60% HER2 - 09/29/2014: Right cervical lymph node, metastatic carcinoma c/w breast primary, ER 100% PR 20% HER2-  Imaging: 08/15/2007: Bilateral Mammogram: Dense breasts with solid lesion at 6:00, 7:00, and 9:00 of right breast, solid lesion at 2:00 position left breast BIRADS 4B. 09/10/2007: Breast MRI: Three solid irregular enhancing lesions of the right breast 2.1 x 3.2, 2.8 x 2.7, and 1.8 x 1.6 cm. Mildly enlarged enhancing nodule right axilla. Left breast clumped enhancement midportion suspicious for malignancy. 03/01/2011: Brain MRI: At least two juxtacortical, intra-axial masses with extensive surrounding vasogenic edema most consistent with intracranial metastasis. 07/22/2013: Brain MRI +/- contrast: interval increase in enhancing component on T2 FLAIR now measuring 1.0cm x 1.1 cm worrisome for progression of disease. 01/21/2014:  PET scan: Hypermetabolic small right supraclavicular and right upper paratracheal lymph nodes suspicious for mets. Hypermetabolic lymph node in the right paratracheal upper mediastinum that measure approximately 0.9 x 0.5cm. (of note no impressions made of areas noted on brain MRI 07/22/13).   01/21/2014: Head MRI: Right frontal enhancing lesion has shown interval increase in size with surrounding edema and local mass effect (measured 1.8x2.1x1.6, previously 1.2x1.1x1.0) 07/16/2014: Head MRI:  Right anterior frontal enhancing mass 2.5 x 2.0 cm compatible with a metastatic lesion has increased and changed in configuration from previous 2.0 x 1.8 cm, formerly multilobular. 08/06/2014: PET scan:  Multiple bilateral FDG avid low cervical, supraclavicular, upper mediastinal, and right internal mammary lymphadenopathy, likely representing metastatic disease. A cervical node in the right lower neck should be amenable to percutaneous sampling.  12/2014: Head MRI:  Evolution of right frontal postoperative changes status post tumor resection at the vertex. Expected postoperative changes. Minimal marginal enhancement resection bed with some adjacent posterior intrinsic T1 signal again seen. Decreasing T2/FLAIR adjacent parenchymal changes.  Stable resection cavity is left posterior frontal and right parieto-occipital regions. 04/20/2015 : PET scan: Cervical and thoracic nodal metastasis.  There was multifocal osseous metastasis (left transverse T4 process, left humeral head, right iliac wing, and left side of the sacrum).  Incidental findings included  right nephrolithiasis.   04/22/2015: Bone scan: Corresponded with PET-CT with metastatic lesions evident in the left humeral head, left posterior aspect of T4, left aspect of S1, and the right iliac crest.  There was no other definite foci of metastatic disease to bone.  Uptake in the skull was most suggestive of the previous right frontal craniotomy. 05/24/2015: Bone density:  T-score of -0.8 in the right femoral neck (normal). 07/07/2015:  Head MRI:  Metastatic breast cancer with 3 resection cavities. The anterior right frontal cavity was positive for nodular marginal enhancement, new from brain MRI report 01/07/2015, and consistent with recurrent disease.   08/12/2015:  Lumbar Spine MRI: Osseous metastatic disease at L3, L4, L5, S1, and in the left iliac bone posteriorly. There was no definite epidural metastatic disease. 08/20/2015:  PET scan:  Response to therapy in the lower cervical and thoracic adenopathy.  There was a mixed response to multifocal osseous metastases (some new lesions).  12/06/2015:  Head MRI:  Decreased nodular contrast enhancement at the right frontal lobe treatment site.  There was unchanged appearance of treatment sites within the bilateral parietal lobe size without residual or recurrent disease.  There were no new metastatic lesions. 02/18/2016: PET scan: Mixed response to therapy. There hadbeen improvement in right cervical lymphadenopathy and in several osseous lesions. There wereseveral other osseous lesions in the pelvis with increasing hypermetabolism compared to the prior study. As the majority of the osseous lesions wereincreasingly sclerotic, some of this hypermetabolism could simply reflect bony healing. There was no new extraskeletal metastatic disease is noted in the neck, chest, abdomen or pelvis. 03/06/2016: Head MRI: Unchanged small focus of nodular enhancement at the right frontal resection site. There was unchanged appearance of the 2 other resection sites without abnormal enhancement. There was no evidence of new intracranial metastases. 05/23/2016:  Bone scan:  Stable focus of abnormal uptake seen in right frontal skull consistent with prior craniotomy.  There were areas of abnormal uptake identified in the left humeral head, T4 and S1 levels and right iliac crest that were present but decreased in intensity compared to prior exam suggesting improvement.  There was a new foci of abnormal uptake are noted in a right lower rib,T12 and L1 levels of spine concerning for metastatic disease. 06/07/2016:  Thoracic and lumbar spine MRI:  multiple low T1/T2 weighted signal, non enhancing lesions within the thoracic  spine, which corresponded in size and location to sclerotic lesions demonstrated on the PET CT of 02/18/2016.  There were no new thoracic lesions.  There was minimal residual contrast enhancement within the L5 vertebral body metastatic lesion, decreased compared to the MRI of 08/12/2015.  There was minimal contrast enhancement at the periphery of the lesion within the L1 vertebral body. This lesion was new compared to MRI of 08/12/2015, but unchanged in size and location compared to PET CT of 02/18/2016.  There was no epidural disease, spinal canal stenosis or neural foraminal encroachment. 06/13/2016:  Head MRI revealed no evidence of residual or recurrent disease. 08/11/2016:  Chest, abdomen, and pelvic CT revealed  new 8 mm right supraclavicular, 12 mm right paratracheal and 10 mm subcarinal lymphadenopathy suspicious for metastatic nodal recurrence.  There was extensive patchy sclerotic osseous metastases throughout the axial and proximal appendicular skeleton appear stable in size and distribution although generally increased in sclerosis since 02/18/2016 PET-CT, which was a nonspecific change that could reflect treatment effect or progression.  There was no additional sites of metastatic disease in the chest, abdomen or  pelvis. 08/11/2016:  Bone scan revealed scattered foci of abnormal osseous tracer accumulation consistent with osseous metastatic disease with new sites of abnormal uptake in the thoracic spine at T5 in the posterior LEFT sixth rib. 08/31/2016:  Chest CT angiogram revealed interval confluent opacity in the posterior aspect of the right upper lobe suspicious for pneumonia. There was a small right-sided pleural effusion and adjacent right lower lobe atelectasis.  Labs:  Appointment on 10/09/2016  Component Date Value Ref Range Status  . WBC 10/09/2016 7.3  3.6 - 11.0 K/uL Final  . RBC 10/09/2016 3.43* 3.80 - 5.20 MIL/uL Final  . Hemoglobin 10/09/2016 10.3* 12.0 - 16.0 g/dL Final  .  HCT 10/09/2016 30.0* 35.0 - 47.0 % Final  . MCV 10/09/2016 87.7  80.0 - 100.0 fL Final  . MCH 10/09/2016 30.0  26.0 - 34.0 pg Final  . MCHC 10/09/2016 34.2  32.0 - 36.0 g/dL Final  . RDW 10/09/2016 14.0  11.5 - 14.5 % Final  . Platelets 10/09/2016 371  150 - 440 K/uL Final  . Neutrophils Relative % 10/09/2016 82  % Final  . Neutro Abs 10/09/2016 6.0  1.4 - 6.5 K/uL Final  . Lymphocytes Relative 10/09/2016 9  % Final  . Lymphs Abs 10/09/2016 0.6* 1.0 - 3.6 K/uL Final  . Monocytes Relative 10/09/2016 7  % Final  . Monocytes Absolute 10/09/2016 0.5  0.2 - 0.9 K/uL Final  . Eosinophils Relative 10/09/2016 1  % Final  . Eosinophils Absolute 10/09/2016 0.1  0 - 0.7 K/uL Final  . Basophils Relative 10/09/2016 1  % Final  . Basophils Absolute 10/09/2016 0.0  0 - 0.1 K/uL Final  . Sodium 10/09/2016 137  135 - 145 mmol/L Final  . Potassium 10/09/2016 3.5  3.5 - 5.1 mmol/L Final  . Chloride 10/09/2016 105  101 - 111 mmol/L Final  . CO2 10/09/2016 26  22 - 32 mmol/L Final  . Glucose, Bld 10/09/2016 119* 65 - 99 mg/dL Final  . BUN 10/09/2016 13  6 - 20 mg/dL Final  . Creatinine, Ser 10/09/2016 0.57  0.44 - 1.00 mg/dL Final  . Calcium 10/09/2016 8.8* 8.9 - 10.3 mg/dL Final  . Total Protein 10/09/2016 7.5  6.5 - 8.1 g/dL Final  . Albumin 10/09/2016 3.4* 3.5 - 5.0 g/dL Final  . AST 10/09/2016 35  15 - 41 U/L Final  . ALT 10/09/2016 17  14 - 54 U/L Final  . Alkaline Phosphatase 10/09/2016 137* 38 - 126 U/L Final  . Total Bilirubin 10/09/2016 0.5  0.3 - 1.2 mg/dL Final  . GFR calc non Af Amer 10/09/2016 >60  >60 mL/min Final  . GFR calc Af Amer 10/09/2016 >60  >60 mL/min Final   Comment: (NOTE) The eGFR has been calculated using the CKD EPI equation. This calculation has not been validated in all clinical situations. eGFR's persistently <60 mL/min signify possible Chronic Kidney Disease.   . Anion gap 10/09/2016 6  5 - 15 Final  . Magnesium 10/09/2016 2.0  1.7 - 2.4 mg/dL Final    Assessment:   Micala Saltsman [name] is a 46 y.o. 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  She initially presented in 2009 while living in [place] with multi-focal right breast cancer with positive lymph node(s) which was ER+, PR+(low), and HER2/neu+. She received neoadjuvant chemotherapy (AC x 4 every 3 weeks followed by Taxol/Abraxane + carboplatin weekly x 12) without anti-HER2 treatment. BRCA1/2 testing was negative on 08/15/2016.  On  07/14/2008, she underwent bilateral mastectomies followed by reconstruction.  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   Zero of 11 lymph nodes on the right were positive for malignancy. 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 Deep margin was negative. Zero of 2 lymph nodes were positive for metastatic disease.  She received adjuvant radiation to the right breast.  She received adjuvant tamoxifen and Zoladex.  She could not tolerate symptoms of joint pain and tamoxifen was discontinued after 1-2 months.  Zoladex was continued for approximately 1.5 years.  She was diagnosed with brain metastases in 02/2011.  Pathology revealed ER+, PR+(low), and HER2/neu-.  She underwent resection followed by [facility].  On 03/09/2011, original breast tissue (09/03/2007) sent to [facility] testing.  Testing revealed ER+, PR+(borderline), and HER2/neu positive (different than initial testing).  She received Herceptin for 1 year beginning in 2013.  She then began Tykerb and Xeloda after completion of Herceptin (2014).  She was on extremely low, and probably sub-therapeutic, dosing of lapatinib + capecitabine.   She developed right supraclavicular adenopathy in 2015. She was noted to have slow disease progression in the right frontal/parietal lesion with minimal presence of systemic disease on PET scans. Capecitabine + lapatinib were discontinued in 04/2014.   She underwent repeat craniotomy with resection of brain  metastasis on 09/04/2014.  Biopsy of the right supraclavicular lymph node was ER+,PR+,HER2/neu-.  She started tamoxifen in 09/2014, but discontinued it secondary to pain in hip, back, and shoulder.  She restarted tamoxifen on 04/23/2015.  Tamoxifen was discontinued on 08/23/2015 secondary to progressive disease.  She received Zoladex on 04/30/2015.  She underwent laparoscopic bilateral oophorectomy on 05/18/2015.  She receives monthly Xgeva (began 04/30/2015; last 07/31/2016).  She is post-menopausal.  She began Faslodex on 08/23/2015 (last 04/04/2016).  She received 7 cycles of Ibrance (09/20/2015 - 06/18/2016).  Patient received palliative radiation 30 Gy to the left humerus and lumbar spine from 08/25/2015 - 09/09/2015.  She is not eligible for a clinical trial.  CA27.29 has been followed: 177.8 on 04/16/2015, 276.4 on 05/28/2015, 287.5 on 07/13/2015, 333.8 on 07/26/2015, 412.9 on 08/23/2015, 315.6 on 09/20/2015, 188.8 on 10/18/2015, 151.5 on 11/15/2015, 122.7 on 12/10/2015, 94.6 on 01/10/2016, 80.9 on 02/07/2016, 79 on 03/06/2016, 84.8 on 04/04/2016, 85.2 on 05/02/2016, 91.1 on 05/30/2016, 121 on 06/30/2016, 182.2 on 07/31/2016, and 420.5 on 10/09/2016.  Head MRI on 06/13/2016 revealed no evidence of residual or recurrent disease.  Chest, abdomen, and pelvic CT on 08/11/2016 revealed  new 8 mm right supraclavicular, 12 mm right paratracheal and 10 mm subcarinal lymphadenopathy suspicious for metastatic nodal recurrence.  There was extensive patchy sclerotic osseous metastases throughout the axial and proximal appendicular skeleton appear stable in size and distribution although generally increased in sclerosis since 02/18/2016 PET-CT, which was a nonspecific change that could reflect treatment effect or progression.  There was no additional sites of metastatic disease in the chest, abdomen or pelvis.  Chest CT angiogram on 08/31/2016 revealed interval confluent opacity in the posterior aspect of the  right upper lobe suspicious for pneumonia. There was a small right-sided pleural effusion and adjacent right lower lobe atelectasis.  She was treated with Levaquin.  Bone scan on 08/11/2016 revealed scattered foci of abnormal osseous tracer accumulation consistent with osseous metastatic disease with new sites of abnormal uptake in the thoracic spine at T5 in the posterior LEFT sixth rib.  Bone density study on 05/24/2015 revealed a T-score of -0.8 in the right femoral  neck (normal).  She completed palliative radiation to T12-L1 of 3000 cGy (06/21/2016 - 07/04/2016).  She began palliative radiation to the left hip from 08/17/2016 - 08/30/2016.  She is on a Fentanyl 25 mcg/hr patch.  Pain is well controlled.  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 Tumor was ER positive (> 90%) PR negative and HER-2/neu negative (1+ IHC).    Xeloda is cost prohibitive.  She does not wish to pursue navelbine or gemcitabine.  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  Work-up on 10/25/2015 revealed a B12 of 141 (low).  B12 was 205 on 12/13/2015 with an MMA of 171 (normal) on 01/10/2016.  Ferritin was 43. Iron studies included a saturation of 13% and a TIBC of 354. TSH and folate were normal.  Reticulocyte count was 2.2%.  She declined B12 injections.  She started oral B12 1000 mcg on 11/05/2015.  She takes her B12 sporadically.  B12 level was 205 (low normal) on 12/13/2015.  Diet is good.  She denies any melena or hematochezia.  Symptomatically, she has left sided bone pain relieved with ibuprofen.  Exam is stable.  Plan: 1.  Labs today: CBC with diff, CMP, Mg, CA27.29. 2.  Discuss plan for chemotherapy.  Potential side effects reviewed.  Patient consented to treatment. 3.  Discuss need for head MRI every 6 months.  Patient would like to defer until after trip. 4.  Discuss  Xgeva.  Patient would like to postpone until next week. 5.  Day 1 of cycle #1 Abraxane today. 6.  Head MRI on 08/09 or 08/10. 7.  RTC in 1 week for MD assessment, labs (CBC with diff, CMP), Xgeva, and week #2 Abraxane. 8.  RTC on 10/30/2016 for MD assessment, labs (CBC with diff, CMP, CA27.29), and day 1 of cycle #2 Abraxane.   Lequita Asal, MD  10/09/2016, 9:42 AM

## 2016-10-09 ENCOUNTER — Encounter: Payer: Self-pay | Admitting: Hematology and Oncology

## 2016-10-09 ENCOUNTER — Inpatient Hospital Stay: Payer: Medicare HMO

## 2016-10-09 ENCOUNTER — Inpatient Hospital Stay (HOSPITAL_BASED_OUTPATIENT_CLINIC_OR_DEPARTMENT_OTHER): Payer: Medicare HMO | Admitting: Hematology and Oncology

## 2016-10-09 VITALS — BP 114/79 | HR 116 | Temp 97.2°F | Wt 150.1 lb

## 2016-10-09 DIAGNOSIS — C770 Secondary and unspecified malignant neoplasm of lymph nodes of head, face and neck: Secondary | ICD-10-CM | POA: Diagnosis not present

## 2016-10-09 DIAGNOSIS — C7951 Secondary malignant neoplasm of bone: Secondary | ICD-10-CM | POA: Diagnosis not present

## 2016-10-09 DIAGNOSIS — Z5111 Encounter for antineoplastic chemotherapy: Secondary | ICD-10-CM

## 2016-10-09 DIAGNOSIS — Z170 Estrogen receptor positive status [ER+]: Secondary | ICD-10-CM | POA: Diagnosis not present

## 2016-10-09 DIAGNOSIS — Z79811 Long term (current) use of aromatase inhibitors: Secondary | ICD-10-CM | POA: Diagnosis not present

## 2016-10-09 DIAGNOSIS — C7931 Secondary malignant neoplasm of brain: Secondary | ICD-10-CM

## 2016-10-09 DIAGNOSIS — D649 Anemia, unspecified: Secondary | ICD-10-CM | POA: Diagnosis not present

## 2016-10-09 DIAGNOSIS — C50919 Malignant neoplasm of unspecified site of unspecified female breast: Secondary | ICD-10-CM

## 2016-10-09 DIAGNOSIS — Z7189 Other specified counseling: Secondary | ICD-10-CM

## 2016-10-09 DIAGNOSIS — C50911 Malignant neoplasm of unspecified site of right female breast: Secondary | ICD-10-CM

## 2016-10-09 DIAGNOSIS — Z9013 Acquired absence of bilateral breasts and nipples: Secondary | ICD-10-CM | POA: Diagnosis not present

## 2016-10-09 DIAGNOSIS — E538 Deficiency of other specified B group vitamins: Secondary | ICD-10-CM

## 2016-10-09 DIAGNOSIS — Z923 Personal history of irradiation: Secondary | ICD-10-CM | POA: Diagnosis not present

## 2016-10-09 LAB — COMPREHENSIVE METABOLIC PANEL
ALT: 17 U/L (ref 14–54)
AST: 35 U/L (ref 15–41)
Albumin: 3.4 g/dL — ABNORMAL LOW (ref 3.5–5.0)
Alkaline Phosphatase: 137 U/L — ABNORMAL HIGH (ref 38–126)
Anion gap: 6 (ref 5–15)
BUN: 13 mg/dL (ref 6–20)
CO2: 26 mmol/L (ref 22–32)
Calcium: 8.8 mg/dL — ABNORMAL LOW (ref 8.9–10.3)
Chloride: 105 mmol/L (ref 101–111)
Creatinine, Ser: 0.57 mg/dL (ref 0.44–1.00)
GFR calc Af Amer: >60 mL/min (ref >60)
GFR calc non Af Amer: >60 mL/min (ref >60)
Glucose, Bld: 119 mg/dL — ABNORMAL HIGH (ref 65–99)
Potassium: 3.5 mmol/L (ref 3.5–5.1)
Sodium: 137 mmol/L (ref 135–145)
Total Bilirubin: 0.5 mg/dL (ref 0.3–1.2)
Total Protein: 7.5 g/dL (ref 6.5–8.1)

## 2016-10-09 LAB — CBC WITH DIFFERENTIAL/PLATELET
Basophils Absolute: 0 10*3/uL (ref 0–0.1)
Basophils Relative: 1 %
Eosinophils Absolute: 0.1 10*3/uL (ref 0–0.7)
Eosinophils Relative: 1 %
HCT: 30 % — ABNORMAL LOW (ref 35.0–47.0)
Hemoglobin: 10.3 g/dL — ABNORMAL LOW (ref 12.0–16.0)
Lymphocytes Relative: 9 %
Lymphs Abs: 0.6 10*3/uL — ABNORMAL LOW (ref 1.0–3.6)
MCH: 30 pg (ref 26.0–34.0)
MCHC: 34.2 g/dL (ref 32.0–36.0)
MCV: 87.7 fL (ref 80.0–100.0)
Monocytes Absolute: 0.5 10*3/uL (ref 0.2–0.9)
Monocytes Relative: 7 %
Neutro Abs: 6 10*3/uL (ref 1.4–6.5)
Neutrophils Relative %: 82 %
Platelets: 371 10*3/uL (ref 150–440)
RBC: 3.43 MIL/uL — ABNORMAL LOW (ref 3.80–5.20)
RDW: 14 % (ref 11.5–14.5)
WBC: 7.3 10*3/uL (ref 3.6–11.0)

## 2016-10-09 LAB — MAGNESIUM: Magnesium: 2 mg/dL (ref 1.7–2.4)

## 2016-10-09 MED ORDER — SODIUM CHLORIDE 0.9% FLUSH
10.0000 mL | INTRAVENOUS | Status: DC | PRN
  Administered 2016-10-09: 10 mL
  Filled 2016-10-09: qty 10

## 2016-10-09 MED ORDER — SODIUM CHLORIDE 0.9 % IV SOLN
Freq: Once | INTRAVENOUS | Status: AC
  Administered 2016-10-09: 11:00:00 via INTRAVENOUS
  Filled 2016-10-09: qty 1000

## 2016-10-09 MED ORDER — PROMETHAZINE HCL 25 MG PO TABS
25.0000 mg | ORAL_TABLET | Freq: Four times a day (QID) | ORAL | Status: DC | PRN
  Administered 2016-10-09: 25 mg via ORAL
  Filled 2016-10-09: qty 1

## 2016-10-09 MED ORDER — PACLITAXEL PROTEIN-BOUND CHEMO INJECTION 100 MG
100.0000 mg/m2 | Freq: Once | INTRAVENOUS | Status: AC
  Administered 2016-10-09: 175 mg via INTRAVENOUS
  Filled 2016-10-09: qty 35

## 2016-10-09 MED ORDER — IBUPROFEN 200 MG PO TABS
800.0000 mg | ORAL_TABLET | Freq: Once | ORAL | Status: AC
  Administered 2016-10-09: 800 mg via ORAL
  Filled 2016-10-09: qty 4

## 2016-10-09 MED ORDER — HEPARIN SOD (PORK) LOCK FLUSH 100 UNIT/ML IV SOLN
500.0000 [IU] | Freq: Once | INTRAVENOUS | Status: AC | PRN
  Administered 2016-10-09: 500 [IU]
  Filled 2016-10-09: qty 5

## 2016-10-09 NOTE — Progress Notes (Signed)
Patient is having pain in her left hip that radiates into her hip and leg.  She states she gets a lot of saliva in her mouth at night.  Wonders if she needs to take protonix to help with that?

## 2016-10-10 LAB — CANCER ANTIGEN 27.29: CA 27.29: 420.5 U/mL — ABNORMAL HIGH (ref 0.0–38.6)

## 2016-10-15 NOTE — Progress Notes (Signed)
[facility] day:  10/16/16   Chief Complaint: Melinda Tanner is a 46 y.o. 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   HPI:  The patient was last seen in the medical oncology clinic on 10/09/2016.  At that time, she had a little pain on her left side.  She was taking ibuprofen.  She had a little headache.  She began chemotherapy.  She received Phenergan for her nausea medication as ondansetron causes nausea/vomiting.  During the interim, she has done well.  She notes no pain since last week.  She had nausea the first day only, well managed with Phenergan.  She notes saliva and cough at night.  She also coughs in the cold air and after taking.   Past Medical History:  Diagnosis Date  . Brain cancer ([place]) 2012   Met. from Breast  . Breast cancer ([place]) 2009  . Complication of anesthesia    nausea, "drops in potassium and magnesium"  . Seizures ([place])     Past Surgical History:  Procedure Laterality Date  . BRAIN SURGERY  2012, 2106  . CESAREAN SECTION    . LAPAROSCOPIC BILATERAL SALPINGO OOPHERECTOMY Bilateral 05/18/2015   Procedure: LAPAROSCOPIC BILATERAL SALPINGO OOPHORECTOMY;  Surgeon: Will Bonnet, MD;  Location: [facility] ORS;  Service: Gynecology;  Laterality: Bilateral;  . MASTECTOMY Bilateral 2010    Family History  Problem Relation Age of Onset  . Cancer Paternal Aunt   . Cancer Paternal Uncle   . Cancer Paternal Grandfather   . Hypertension Brother   . Diabetes Paternal Grandmother   A paternal aunt had breast cancer age 33, a paternal uncle had prostate cancer, and a paternal grandfather had leukemia.  Social History:  reports that she has never smoked. She has never used smokeless tobacco. She reports that she does not drink alcohol or use drugs.  She is from [place].  She moved to [place] in 2015.  She moved to [place]  in 04/2014.  She recently moved into the [place] area.  She has 2 children (boy and girl).  Her family will likely be moving to [facility] in December.  They are looking for a home.  She is going on a trip to [place] 10/18/2016 - 10/24/2016.  The patient is accompanied by her husband and the Spanish interpreter today.  Allergies:  Allergies  Allergen Reactions  . Taxol [Paclitaxel] Anaphylaxis  . Aspirin Swelling  . Penicillins Swelling  . Zofran [Ondansetron Hcl] Nausea And Vomiting    Current Medications: Current Outpatient Prescriptions  Medication Sig Dispense Refill  . Cyanocobalamin (VITAMIN B-12 PO) Take by mouth.    . fentaNYL (DURAGESIC - DOSED MCG/HR) 25 MCG/HR patch Place 1 patch (25 mcg total) onto the skin every 3 (three) days. 10 patch 0  . ibuprofen (ADVIL,MOTRIN) 800 MG tablet Take 800 mg by mouth every 8 (eight) hours as needed (Takes 1/2 tablet prn).    . Multiple Vitamins-Minerals (CENTRUM ADULTS PO) Take 1 tablet by mouth daily.    Marland Kitchen omeprazole (PRILOSEC) 20 MG capsule Take 20 mg by mouth daily.  2  . pantoprazole (PROTONIX) 20 MG tablet Take 1 tablet by mouth daily 30 tablet 3  . traMADol (ULTRAM) 50 MG tablet Take 50 mg by mouth every 12 (twelve) hours as needed.    . capecitabine (XELODA) 150 MG tablet Take 1 tablet (  150 mg total) by mouth 2 (two) times daily after a meal. Take with 500 mg prescription (Patient not taking: Reported on 09/29/2016) 28 tablet 0  . capecitabine (XELODA) 500 MG tablet Take 3 tablets (1,500 mg total) by mouth 2 (two) times daily after a meal. Take with 150 mg prescription (Patient not taking: Reported on 09/29/2016) 84 tablet 0  . docusate sodium (COLACE) 100 MG capsule Take 1 tablet once or twice daily as needed for constipation while taking narcotic pain medicine (Patient not taking: Reported on 09/29/2016) 30 capsule 0  . HYDROcodone-acetaminophen (NORCO/VICODIN) 5-325 MG tablet Take 1-2 tablets by mouth every 4 (four) hours as needed  for moderate pain. (Patient not taking: Reported on 09/25/2016) 30 tablet 0  . oxycodone (OXY-IR) 5 MG capsule Take 1 capsule (5 mg total) by mouth every 4 (four) hours as needed. (Patient not taking: Reported on 09/25/2016) 30 capsule 0  . oxyCODONE-acetaminophen (PERCOCET/ROXICET) 5-325 MG tablet Take 1 tablet by mouth every 4 (four) hours as needed for severe pain. (Patient not taking: Reported on 09/25/2016) 40 tablet 0  . palbociclib (IBRANCE) 100 MG capsule Take 1 capsule daily for 21 days and then take 1 week off and repeat cycle again (Take whole with food.) (Patient not taking: Reported on 09/25/2016) 21 capsule 1  . potassium chloride SA (K-DUR,KLOR-CON) 20 MEQ tablet Take 1 tablet (20 mEq total) by mouth 2 (two) times daily. for 1 days then 1 pill a day x 2 days. (Patient not taking: Reported on 09/29/2016) 10 tablet 0  . promethazine (PHENERGAN) 25 MG tablet Take 1 tablet (25 mg total) by mouth every 6 (six) hours as needed for nausea or vomiting. (Patient not taking: Reported on 09/29/2016) 30 tablet 2   No current facility-administered medications for this visit.     Review of Systems:  GENERAL:  Feels "better".  No fevers or sweats.  Weight down 1 pound. PERFORMANCE STATUS (ECOG):  1 HEENT:  No visual changes, runny nose, sore throat, mouth sores or tenderness.  Excess saliva. Lungs: No shortness of breath.  Cough.  No hemoptysis. Cardiac:  No chest pain, palpitations, orthopnea, or PND. GI:  Nausea x 1 day with chemotherapy.  No diarrhea, constipation, melena or hematochezia.  GU:  No urgency, frequency, dysuria or hematuria. Musculoskeletal: No pain.  No muscle tenderness. Extremities:  No pain or swelling. Skin:  No rashes or irritation. Neuro:  No headache, numbness or weakness, balance or coordination issues.  Endocrine:  No diabetes, thyroid issues, or night sweats.  Hot flashes. Psych:  No mood changes, depression or anxiety. Pain:  No pain (see HPI). Review of systems:  All  other systems reviewed and found to be negative.  Physical Exam: Blood pressure 109/77, pulse (!) 106, temperature 98 F (36.7 C), temperature source Tympanic, resp. rate 18, weight 149 lb 9 oz (67.8 kg), last menstrual period 03/19/2015. GENERAL:  Well developed, well nourished, woman sitting comfortably in the exam room in no acute distress.  Intermittent cough. MENTAL STATUS:  Alert and oriented to person, place and time. HEAD: Wearing a brown crochet hat.  Short black hair.  Normocephalic, atraumatic, face symmetric, no Cushingoid features. EYES:  Brown eyes. Pupils equal round and reactive to light and accomodation.  No conjunctivitis or scleral icterus. ENT:  Oropharynx clear without lesion.  Tongue normal. Mucous membranes moist.  RESPIRATORY:  Decreased breath sounds right base (old secondary to elevated hemidiaphragm).  Clear to auscultation without rales, wheezes or rhonchi. CARDIOVASCULAR:  Regular  rate and rhythm without murmur, rub or gallop. ABDOMEN:  Soft, non-tender, with active bowel sounds, and no hepatosplenomegaly.  No masses. SKIN:  No rashes, bruises or ulcers. EXTREMITIES:  No edema, no skin discoloration or tenderness.  No palpable cords. LYMPH NODES:  No palpable cervical, supraclavicular, axillary or inguinal adenopathy  NEUROLOGICAL:  Appropriate. PSYCH:  Appropriate.     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 No lymph invasion. Tumor cellularity 20%. [id number]. 07/13/2008: Left mastectomy: Residual ductal carcinoma in situ, high nuclear grade, deep margin negative ypTisNO(sn)MX. 10/25/2010: Abdominal and  pelvic ultrasound: questionable lesion near gallbladder fossa recommend MRI follow up. 03/02/2011: Left and right frontal excisional brain biopsy: Adenocarcinoma, metastatic, NOS: ER +, PR 5% +, Her 2 NEG. 03/04/2011: [facility] testing of IHC4 residual recurrence risk score of 114 showing an 8 year recurrence reate of 57%. ER POSTIVE, PR POSITIVE, HER 2 POSTIVE (previously documented negative in 2009) cutoff of 361 patient scores 426. ki67 71%.  08/05/2013: Right breast FNA supraclavicular region: positive for metastatic carcinoma. ER/PR/HER2 not reported. 08/19/2014: Right cervical lymph node. Adenocarcinoma with features consistent with metastatic breast carcinoma. ER/PR/HER2 insufficient tissue. 09/04/2014: Repeat craniotomy, resection of right frontal tumor (metastatic breast cancer). ER 90-100% PR 51-60% HER2 - 09/29/2014: Right cervical lymph node, metastatic carcinoma c/w breast primary, ER 100% PR 20% HER2-  Imaging: 08/15/2007: Bilateral Mammogram: Dense breasts with solid lesion at 6:00, 7:00, and 9:00 of right breast, solid lesion at 2:00 position left breast BIRADS 4B. 09/10/2007: Breast MRI: Three solid irregular enhancing lesions of the right breast 2.1 x 3.2, 2.8 x 2.7, and 1.8 x 1.6 cm. Mildly enlarged enhancing nodule right axilla. Left breast clumped enhancement midportion suspicious for malignancy. 03/01/2011: Brain MRI: At least two juxtacortical, intra-axial masses with extensive surrounding vasogenic edema most consistent with intracranial metastasis. 07/22/2013: Brain MRI +/- contrast: interval increase in enhancing component on T2 FLAIR now measuring 1.0cm x 1.1 cm worrisome for progression of disease. 01/21/2014:  PET scan: Hypermetabolic small right supraclavicular and right upper paratracheal lymph nodes suspicious for mets. Hypermetabolic lymph node in the right paratracheal upper mediastinum that measure approximately 0.9 x 0.5cm. (of note no impressions made of areas noted  on brain MRI 07/22/13).  01/21/2014: Head MRI: Right frontal enhancing lesion has shown interval increase in size with surrounding edema and local mass effect (measured 1.8x2.1x1.6, previously 1.2x1.1x1.0) 07/16/2014: Head MRI:  Right anterior frontal enhancing mass 2.5 x 2.0 cm compatible with a metastatic lesion has increased and changed in configuration from previous 2.0 x 1.8 cm, formerly multilobular. 08/06/2014: PET scan:  Multiple bilateral FDG avid low cervical, supraclavicular, upper mediastinal, and right internal mammary lymphadenopathy, likely representing metastatic disease. A cervical node in the right lower neck should be amenable to percutaneous sampling.  12/2014: Head MRI:  Evolution of right frontal postoperative changes status post tumor resection at the vertex. Expected postoperative changes. Minimal marginal enhancement resection bed with some adjacent posterior intrinsic T1 signal again seen. Decreasing T2/FLAIR adjacent parenchymal changes.  Stable resection cavity is left posterior frontal and right parieto-occipital regions. 04/20/2015 : PET scan: Cervical and thoracic nodal metastasis.  There was multifocal osseous metastasis (left transverse T4 process, left humeral head, right iliac wing,  and left side of the sacrum).  Incidental findings included right nephrolithiasis.   04/22/2015: Bone scan: Corresponded with PET-CT with metastatic lesions evident in the left humeral head, left posterior aspect of T4, left aspect of S1, and the right iliac crest.  There was no other definite foci of metastatic disease to bone.  Uptake in the skull was most suggestive of the previous right frontal craniotomy. 05/24/2015: Bone density:  T-score of -0.8 in the right femoral neck (normal). 07/07/2015:  Head MRI:  Metastatic breast cancer with 3 resection cavities. The anterior right frontal cavity was positive for nodular marginal enhancement, new from brain MRI report 01/07/2015, and consistent  with recurrent disease.  08/12/2015:  Lumbar Spine MRI: Osseous metastatic disease at L3, L4, L5, S1, and in the left iliac bone posteriorly. There was no definite epidural metastatic disease. 08/20/2015:  PET scan:  Response to therapy in the lower cervical and thoracic adenopathy.  There was a mixed response to multifocal osseous metastases (some new lesions).  12/06/2015:  Head MRI:  Decreased nodular contrast enhancement at the right frontal lobe treatment site.  There was unchanged appearance of treatment sites within the bilateral parietal lobe size without residual or recurrent disease.  There were no new metastatic lesions. 02/18/2016: PET scan: Mixed response to therapy. There hadbeen improvement in right cervical lymphadenopathy and in several osseous lesions. There wereseveral other osseous lesions in the pelvis with increasing hypermetabolism compared to the prior study. As the majority of the osseous lesions wereincreasingly sclerotic, some of this hypermetabolism could simply reflect bony healing. There was no new extraskeletal metastatic disease is noted in the neck, chest, abdomen or pelvis. 03/06/2016: Head MRI: Unchanged small focus of nodular enhancement at the right frontal resection site. There was unchanged appearance of the 2 other resection sites without abnormal enhancement. There was no evidence of new intracranial metastases. 05/23/2016:  Bone scan:  Stable focus of abnormal uptake seen in right frontal skull consistent with prior craniotomy.  There were areas of abnormal uptake identified in the left humeral head, T4 and S1 levels and right iliac crest that were present but decreased in intensity compared to prior exam suggesting improvement.  There was a new foci of abnormal uptake are noted in a right lower rib,T12 and L1 levels of spine concerning for metastatic disease. 06/07/2016:  Thoracic and lumbar spine MRI:  multiple low T1/T2 weighted signal, non enhancing  lesions within the thoracic spine, which corresponded in size and location to sclerotic lesions demonstrated on the PET CT of 02/18/2016.  There were no new thoracic lesions.  There was minimal residual contrast enhancement within the L5 vertebral body metastatic lesion, decreased compared to the MRI of 08/12/2015.  There was minimal contrast enhancement at the periphery of the lesion within the L1 vertebral body. This lesion was new compared to MRI of 08/12/2015, but unchanged in size and location compared to PET CT of 02/18/2016.  There was no epidural disease, spinal canal stenosis or neural foraminal encroachment. 06/13/2016:  Head MRI revealed no evidence of residual or recurrent disease. 08/11/2016:  Chest, abdomen, and pelvic CT revealed  new 8 mm right supraclavicular, 12 mm right paratracheal and 10 mm subcarinal lymphadenopathy suspicious for metastatic nodal recurrence.  There was extensive patchy sclerotic osseous metastases throughout the axial and proximal appendicular skeleton appear stable in size and distribution although generally increased in sclerosis since 02/18/2016 PET-CT, which was a nonspecific change that could reflect treatment effect or progression.  There was no  additional sites of metastatic disease in the chest, abdomen or pelvis. 08/11/2016:  Bone scan revealed scattered foci of abnormal osseous tracer accumulation consistent with osseous metastatic disease with new sites of abnormal uptake in the thoracic spine at T5 in the posterior LEFT sixth rib. 08/31/2016:  Chest CT angiogram revealed interval confluent opacity in the posterior aspect of the right upper lobe suspicious for pneumonia. There was a small right-sided pleural effusion and adjacent right lower lobe atelectasis.  Labs:  Appointment on 10/16/2016  Component Date Value Ref Range Status  . WBC 10/16/2016 5.2  3.6 - 11.0 K/uL Final  . RBC 10/16/2016 3.45* 3.80 - 5.20 MIL/uL Final  . Hemoglobin 10/16/2016 10.1*  12.0 - 16.0 g/dL Final  . HCT 10/16/2016 30.0* 35.0 - 47.0 % Final  . MCV 10/16/2016 87.0  80.0 - 100.0 fL Final  . MCH 10/16/2016 29.4  26.0 - 34.0 pg Final  . MCHC 10/16/2016 33.8  32.0 - 36.0 g/dL Final  . RDW 10/16/2016 13.7  11.5 - 14.5 % Final  . Platelets 10/16/2016 406  150 - 440 K/uL Final  . Neutrophils Relative % 10/16/2016 77  % Final  . Neutro Abs 10/16/2016 4.0  1.4 - 6.5 K/uL Final  . Lymphocytes Relative 10/16/2016 14  % Final  . Lymphs Abs 10/16/2016 0.7* 1.0 - 3.6 K/uL Final  . Monocytes Relative 10/16/2016 6  % Final  . Monocytes Absolute 10/16/2016 0.3  0.2 - 0.9 K/uL Final  . Eosinophils Relative 10/16/2016 2  % Final  . Eosinophils Absolute 10/16/2016 0.1  0 - 0.7 K/uL Final  . Basophils Relative 10/16/2016 1  % Final  . Basophils Absolute 10/16/2016 0.0  0 - 0.1 K/uL Final  . Sodium 10/16/2016 138  135 - 145 mmol/L Final  . Potassium 10/16/2016 3.8  3.5 - 5.1 mmol/L Final  . Chloride 10/16/2016 104  101 - 111 mmol/L Final  . CO2 10/16/2016 25  22 - 32 mmol/L Final  . Glucose, Bld 10/16/2016 91  65 - 99 mg/dL Final  . BUN 10/16/2016 9  6 - 20 mg/dL Final  . Creatinine, Ser 10/16/2016 0.55  0.44 - 1.00 mg/dL Final  . Calcium 10/16/2016 9.1  8.9 - 10.3 mg/dL Final  . Total Protein 10/16/2016 7.5  6.5 - 8.1 g/dL Final  . Albumin 10/16/2016 3.6  3.5 - 5.0 g/dL Final  . AST 10/16/2016 40  15 - 41 U/L Final  . ALT 10/16/2016 22  14 - 54 U/L Final  . Alkaline Phosphatase 10/16/2016 124  38 - 126 U/L Final  . Total Bilirubin 10/16/2016 0.5  0.3 - 1.2 mg/dL Final  . GFR calc non Af Amer 10/16/2016 >60  >60 mL/min Final  . GFR calc Af Amer 10/16/2016 >60  >60 mL/min Final   Comment: (NOTE) The eGFR has been calculated using the CKD EPI equation. This calculation has not been validated in all clinical situations. eGFR's persistently <60 mL/min signify possible Chronic Kidney Disease.   . Anion gap 10/16/2016 9  5 - 15 Final    Assessment:  Melinda Tanner is  a 46 y.o. 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  She initially presented in 2009 while living in [place] with multi-focal right breast cancer with positive lymph node(s) which was ER+, PR+(low), and HER2/neu+. She received neoadjuvant chemotherapy (AC x 4 every 3 weeks followed by Taxol/Abraxane + carboplatin weekly x 12) without anti-HER2 treatment. BRCA1/2 testing was negative on  08/15/2016.  On 07/14/2008, she underwent bilateral mastectomies followed by reconstruction.  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   Zero of 11 lymph nodes on the right were positive for malignancy. 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 Deep margin was negative. Zero of 2 lymph nodes were positive for metastatic disease.  She received adjuvant radiation to the right breast.  She received adjuvant tamoxifen and Zoladex.  She could not tolerate symptoms of joint pain and tamoxifen was discontinued after 1-2 months.  Zoladex was continued for approximately 1.5 years.  She was diagnosed with brain metastases in 02/2011.  Pathology revealed ER+, PR+(low), and HER2/neu-.  She underwent resection followed by [facility].  On 03/09/2011, original breast tissue (09/03/2007) sent to [facility] testing.  Testing revealed ER+, PR+(borderline), and HER2/neu positive (different than initial testing).  She received Herceptin for 1 year beginning in 2013.  She then began Tykerb and Xeloda after completion of Herceptin (2014).  She was on extremely low, and probably sub-therapeutic, dosing of lapatinib + capecitabine.   She developed right supraclavicular adenopathy in 2015. She was noted to have slow disease progression in the right frontal/parietal lesion with minimal presence of systemic disease on PET scans. Capecitabine + lapatinib were discontinued in 04/2014.   She underwent repeat craniotomy with resection of brain metastasis on 09/04/2014.   Biopsy of the right supraclavicular lymph node was ER+,PR+,HER2/neu-.  She started tamoxifen in 09/2014, but discontinued it secondary to pain in hip, back, and shoulder.  She restarted tamoxifen on 04/23/2015.  Tamoxifen was discontinued on 08/23/2015 secondary to progressive disease.  She received Zoladex on 04/30/2015.  She underwent laparoscopic bilateral oophorectomy on 05/18/2015.  She receives monthly Xgeva (began 04/30/2015; last 07/31/2016).  She is post-menopausal.  She began Faslodex on 08/23/2015 (last 04/04/2016).  She received 7 cycles of Ibrance (09/20/2015 - 06/18/2016).  Patient received palliative radiation 30 Gy to the left humerus and lumbar spine from 08/25/2015 - 09/09/2015.  She is not eligible for a clinical trial.  CA27.29 has been followed: 177.8 on 04/16/2015, 276.4 on 05/28/2015, 287.5 on 07/13/2015, 333.8 on 07/26/2015, 412.9 on 08/23/2015, 315.6 on 09/20/2015, 188.8 on 10/18/2015, 151.5 on 11/15/2015, 122.7 on 12/10/2015, 94.6 on 01/10/2016, 80.9 on 02/07/2016, 79 on 03/06/2016, 84.8 on 04/04/2016, 85.2 on 05/02/2016, 91.1 on 05/30/2016, 121 on 06/30/2016, 182.2 on 07/31/2016, and 420.5 on 10/09/2016.  Head MRI on 06/13/2016 revealed no evidence of residual or recurrent disease.  Chest, abdomen, and pelvic CT on 08/11/2016 revealed  new 8 mm right supraclavicular, 12 mm right paratracheal and 10 mm subcarinal lymphadenopathy suspicious for metastatic nodal recurrence.  There was extensive patchy sclerotic osseous metastases throughout the axial and proximal appendicular skeleton appear stable in size and distribution although generally increased in sclerosis since 02/18/2016 PET-CT, which was a nonspecific change that could reflect treatment effect or progression.  There was no additional sites of metastatic disease in the chest, abdomen or pelvis.  Chest CT angiogram on 08/31/2016 revealed interval confluent opacity in the posterior aspect of the right upper lobe suspicious  for pneumonia. There was a small right-sided pleural effusion and adjacent right lower lobe atelectasis.  She was treated with Levaquin.  Bone scan on 08/11/2016 revealed scattered foci of abnormal osseous tracer accumulation consistent with osseous metastatic disease with new sites of abnormal uptake in the thoracic spine at T5 in the posterior LEFT sixth rib.  Bone density study on 05/24/2015 revealed a T-score of -0.8 in  the right femoral neck (normal).  She completed palliative radiation to T12-L1 of 3000 cGy (06/21/2016 - 07/04/2016).  She began palliative radiation to the left hip from 08/17/2016 - 08/30/2016.  She is on a Fentanyl 25 mcg/hr patch.  Pain is well controlled.  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 Tumor was ER positive (> 90%) PR negative and HER-2/neu negative (1+ IHC).    Xeloda is cost prohibitive.  She does not wish to pursue navelbine or gemcitabine.  She is day 8 of cycle #1 Abraxane (10/09/2016).  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  Work-up on 10/25/2015 revealed a B12 of 141 (low).  B12 was 205 on 12/13/2015 with an MMA of 171 (normal) on 01/10/2016.  Ferritin was 43. Iron studies included a saturation of 13% and a TIBC of 354. TSH and folate were normal.  Reticulocyte count was 2.2%.  She declined B12 injections.  She started oral B12 1000 mcg on 11/05/2015.  She takes her B12 sporadically.  B12 level was 205 (low normal) on 12/13/2015.  Diet is good.  She denies any melena or hematochezia.  Symptomatically, her pain has resolved since initiation of chemotherapy.  She has a cough aggravated by talking.  Exam is stable.  Plan: 1.  Labs today: CBC with diff, CMP. 2.  Day 8 of cycle #1 Abraxane today. 3.  Xgeva today. 4.  Discuss pain management.  Pain denies pain.  Discuss avoidance of excess ibuprofen. 5.  Discuss  cough.  Patient wishes to try OTC cough drops. 6.  Head MRI on 10/27/2016. 7.  RTC on 10/30/2016 for MD assessment, labs (CBC with diff, CMP, CA27.29), and day 1 of cycle #2 Abraxane.   Lequita Asal, MD  10/16/2016, 10:24 AM

## 2016-10-16 ENCOUNTER — Inpatient Hospital Stay: Payer: Medicare HMO

## 2016-10-16 ENCOUNTER — Inpatient Hospital Stay (HOSPITAL_BASED_OUTPATIENT_CLINIC_OR_DEPARTMENT_OTHER): Payer: Medicare HMO | Admitting: Hematology and Oncology

## 2016-10-16 ENCOUNTER — Encounter: Payer: Self-pay | Admitting: Hematology and Oncology

## 2016-10-16 VITALS — BP 109/77 | HR 106 | Temp 98.0°F | Resp 18 | Wt 149.6 lb

## 2016-10-16 DIAGNOSIS — Z923 Personal history of irradiation: Secondary | ICD-10-CM

## 2016-10-16 DIAGNOSIS — Z79811 Long term (current) use of aromatase inhibitors: Secondary | ICD-10-CM

## 2016-10-16 DIAGNOSIS — D649 Anemia, unspecified: Secondary | ICD-10-CM | POA: Diagnosis not present

## 2016-10-16 DIAGNOSIS — C7951 Secondary malignant neoplasm of bone: Secondary | ICD-10-CM

## 2016-10-16 DIAGNOSIS — E538 Deficiency of other specified B group vitamins: Secondary | ICD-10-CM | POA: Diagnosis not present

## 2016-10-16 DIAGNOSIS — Z9013 Acquired absence of bilateral breasts and nipples: Secondary | ICD-10-CM

## 2016-10-16 DIAGNOSIS — C50919 Malignant neoplasm of unspecified site of unspecified female breast: Secondary | ICD-10-CM

## 2016-10-16 DIAGNOSIS — C770 Secondary and unspecified malignant neoplasm of lymph nodes of head, face and neck: Secondary | ICD-10-CM | POA: Diagnosis not present

## 2016-10-16 DIAGNOSIS — C50911 Malignant neoplasm of unspecified site of right female breast: Secondary | ICD-10-CM

## 2016-10-16 DIAGNOSIS — Z5111 Encounter for antineoplastic chemotherapy: Secondary | ICD-10-CM

## 2016-10-16 DIAGNOSIS — Z170 Estrogen receptor positive status [ER+]: Secondary | ICD-10-CM | POA: Diagnosis not present

## 2016-10-16 DIAGNOSIS — Z7189 Other specified counseling: Secondary | ICD-10-CM

## 2016-10-16 DIAGNOSIS — G893 Neoplasm related pain (acute) (chronic): Secondary | ICD-10-CM

## 2016-10-16 DIAGNOSIS — C7931 Secondary malignant neoplasm of brain: Secondary | ICD-10-CM

## 2016-10-16 LAB — CBC WITH DIFFERENTIAL/PLATELET
Basophils Absolute: 0 10*3/uL (ref 0–0.1)
Basophils Relative: 1 %
Eosinophils Absolute: 0.1 10*3/uL (ref 0–0.7)
Eosinophils Relative: 2 %
HCT: 30 % — ABNORMAL LOW (ref 35.0–47.0)
Hemoglobin: 10.1 g/dL — ABNORMAL LOW (ref 12.0–16.0)
Lymphocytes Relative: 14 %
Lymphs Abs: 0.7 10*3/uL — ABNORMAL LOW (ref 1.0–3.6)
MCH: 29.4 pg (ref 26.0–34.0)
MCHC: 33.8 g/dL (ref 32.0–36.0)
MCV: 87 fL (ref 80.0–100.0)
Monocytes Absolute: 0.3 10*3/uL (ref 0.2–0.9)
Monocytes Relative: 6 %
Neutro Abs: 4 10*3/uL (ref 1.4–6.5)
Neutrophils Relative %: 77 %
Platelets: 406 10*3/uL (ref 150–440)
RBC: 3.45 MIL/uL — ABNORMAL LOW (ref 3.80–5.20)
RDW: 13.7 % (ref 11.5–14.5)
WBC: 5.2 10*3/uL (ref 3.6–11.0)

## 2016-10-16 LAB — COMPREHENSIVE METABOLIC PANEL
ALT: 22 U/L (ref 14–54)
AST: 40 U/L (ref 15–41)
Albumin: 3.6 g/dL (ref 3.5–5.0)
Alkaline Phosphatase: 124 U/L (ref 38–126)
Anion gap: 9 (ref 5–15)
BUN: 9 mg/dL (ref 6–20)
CO2: 25 mmol/L (ref 22–32)
Calcium: 9.1 mg/dL (ref 8.9–10.3)
Chloride: 104 mmol/L (ref 101–111)
Creatinine, Ser: 0.55 mg/dL (ref 0.44–1.00)
GFR calc Af Amer: >60 mL/min (ref >60)
GFR calc non Af Amer: >60 mL/min (ref >60)
Glucose, Bld: 91 mg/dL (ref 65–99)
Potassium: 3.8 mmol/L (ref 3.5–5.1)
Sodium: 138 mmol/L (ref 135–145)
Total Bilirubin: 0.5 mg/dL (ref 0.3–1.2)
Total Protein: 7.5 g/dL (ref 6.5–8.1)

## 2016-10-16 MED ORDER — PROMETHAZINE HCL 25 MG PO TABS
25.0000 mg | ORAL_TABLET | Freq: Four times a day (QID) | ORAL | Status: DC | PRN
  Administered 2016-10-16: 25 mg via ORAL
  Filled 2016-10-16: qty 1

## 2016-10-16 MED ORDER — SODIUM CHLORIDE 0.9 % IV SOLN
Freq: Once | INTRAVENOUS | Status: AC
  Administered 2016-10-16: 11:00:00 via INTRAVENOUS
  Filled 2016-10-16: qty 1000

## 2016-10-16 MED ORDER — HEPARIN SOD (PORK) LOCK FLUSH 100 UNIT/ML IV SOLN
500.0000 [IU] | Freq: Once | INTRAVENOUS | Status: AC | PRN
  Administered 2016-10-16: 500 [IU]
  Filled 2016-10-16: qty 5

## 2016-10-16 MED ORDER — DENOSUMAB 120 MG/1.7ML ~~LOC~~ SOLN
120.0000 mg | Freq: Once | SUBCUTANEOUS | Status: AC
  Administered 2016-10-16: 120 mg via SUBCUTANEOUS
  Filled 2016-10-16: qty 1.7

## 2016-10-16 MED ORDER — PACLITAXEL PROTEIN-BOUND CHEMO INJECTION 100 MG
100.0000 mg/m2 | Freq: Once | INTRAVENOUS | Status: AC
  Administered 2016-10-16: 175 mg via INTRAVENOUS
  Filled 2016-10-16: qty 35

## 2016-10-16 NOTE — Progress Notes (Signed)
Patient states she has been eating better. States she continues to have some shortness of breath.

## 2016-10-26 ENCOUNTER — Ambulatory Visit: Payer: Medicare HMO

## 2016-10-27 ENCOUNTER — Ambulatory Visit
Admission: RE | Admit: 2016-10-27 | Discharge: 2016-10-27 | Disposition: A | Discharge status: Home / Self Care | Payer: Medicare HMO | Source: Ambulatory Visit | Attending: Hematology and Oncology | Admitting: Hematology and Oncology

## 2016-10-27 ENCOUNTER — Telehealth: Payer: Self-pay | Admitting: *Deleted

## 2016-10-27 DIAGNOSIS — Z7189 Other specified counseling: Secondary | ICD-10-CM

## 2016-10-27 DIAGNOSIS — C7931 Secondary malignant neoplasm of brain: Secondary | ICD-10-CM | POA: Diagnosis not present

## 2016-10-27 DIAGNOSIS — C7951 Secondary malignant neoplasm of bone: Secondary | ICD-10-CM

## 2016-10-27 DIAGNOSIS — Z5111 Encounter for antineoplastic chemotherapy: Secondary | ICD-10-CM | POA: Diagnosis not present

## 2016-10-27 DIAGNOSIS — C50919 Malignant neoplasm of unspecified site of unspecified female breast: Secondary | ICD-10-CM

## 2016-10-27 MED ORDER — GADOBENATE DIMEGLUMINE 529 MG/ML IV SOLN
15.0000 mL | Freq: Once | INTRAVENOUS | Status: AC | PRN
  Administered 2016-10-27: 14 mL via INTRAVENOUS

## 2016-10-27 NOTE — Telephone Encounter (Addendum)
Called report of MRI results:  IMPRESSION: 1. Positive for 9 new small brain metastases since March. The largest is a 10 mm metastasis in the posterior right cerebellum. The smallest lesions are punctate, and 1 of these along the posterior inferior left cerebellar hemisphere is suspicious for Leptomeningeal based disease. 2. No associated edema or mass effect. 3. The 3 previous craniotomy sites remain stable.

## 2016-10-30 ENCOUNTER — Inpatient Hospital Stay: Payer: Medicare HMO | Attending: Hematology and Oncology

## 2016-10-30 ENCOUNTER — Inpatient Hospital Stay: Payer: Medicare HMO

## 2016-10-30 ENCOUNTER — Inpatient Hospital Stay (HOSPITAL_BASED_OUTPATIENT_CLINIC_OR_DEPARTMENT_OTHER): Payer: Medicare HMO | Admitting: Hematology and Oncology

## 2016-10-30 ENCOUNTER — Ambulatory Visit
Admission: RE | Admit: 2016-10-30 | Discharge: 2016-10-30 | Disposition: A | Discharge status: Home / Self Care | Payer: Medicare HMO | Source: Ambulatory Visit | Attending: Radiation Oncology | Admitting: Radiation Oncology

## 2016-10-30 ENCOUNTER — Other Ambulatory Visit: Payer: Self-pay | Admitting: Hematology and Oncology

## 2016-10-30 ENCOUNTER — Encounter: Payer: Self-pay | Admitting: Hematology and Oncology

## 2016-10-30 VITALS — BP 119/84 | HR 101 | Temp 98.6°F | Resp 18 | Wt 151.0 lb

## 2016-10-30 DIAGNOSIS — C50919 Malignant neoplasm of unspecified site of unspecified female breast: Secondary | ICD-10-CM

## 2016-10-30 DIAGNOSIS — Z5111 Encounter for antineoplastic chemotherapy: Secondary | ICD-10-CM | POA: Diagnosis not present

## 2016-10-30 DIAGNOSIS — C7931 Secondary malignant neoplasm of brain: Secondary | ICD-10-CM | POA: Insufficient documentation

## 2016-10-30 DIAGNOSIS — D649 Anemia, unspecified: Secondary | ICD-10-CM

## 2016-10-30 DIAGNOSIS — Z510 Encounter for antineoplastic radiation therapy: Secondary | ICD-10-CM | POA: Insufficient documentation

## 2016-10-30 DIAGNOSIS — C7951 Secondary malignant neoplasm of bone: Secondary | ICD-10-CM

## 2016-10-30 DIAGNOSIS — Z79811 Long term (current) use of aromatase inhibitors: Secondary | ICD-10-CM

## 2016-10-30 DIAGNOSIS — C770 Secondary and unspecified malignant neoplasm of lymph nodes of head, face and neck: Secondary | ICD-10-CM

## 2016-10-30 DIAGNOSIS — Z170 Estrogen receptor positive status [ER+]: Secondary | ICD-10-CM | POA: Insufficient documentation

## 2016-10-30 DIAGNOSIS — Z7189 Other specified counseling: Secondary | ICD-10-CM

## 2016-10-30 DIAGNOSIS — Z9013 Acquired absence of bilateral breasts and nipples: Secondary | ICD-10-CM | POA: Diagnosis not present

## 2016-10-30 DIAGNOSIS — E538 Deficiency of other specified B group vitamins: Secondary | ICD-10-CM | POA: Insufficient documentation

## 2016-10-30 DIAGNOSIS — Z780 Asymptomatic menopausal state: Secondary | ICD-10-CM | POA: Diagnosis not present

## 2016-10-30 DIAGNOSIS — C50911 Malignant neoplasm of unspecified site of right female breast: Secondary | ICD-10-CM

## 2016-10-30 DIAGNOSIS — Z923 Personal history of irradiation: Secondary | ICD-10-CM | POA: Insufficient documentation

## 2016-10-30 DIAGNOSIS — J90 Pleural effusion, not elsewhere classified: Secondary | ICD-10-CM | POA: Insufficient documentation

## 2016-10-30 DIAGNOSIS — G893 Neoplasm related pain (acute) (chronic): Secondary | ICD-10-CM

## 2016-10-30 DIAGNOSIS — Z9221 Personal history of antineoplastic chemotherapy: Secondary | ICD-10-CM | POA: Insufficient documentation

## 2016-10-30 LAB — CBC WITH DIFFERENTIAL/PLATELET
Basophils Absolute: 0 10*3/uL (ref 0–0.1)
Basophils Relative: 1 %
Eosinophils Absolute: 0.1 10*3/uL (ref 0–0.7)
Eosinophils Relative: 1 %
HCT: 30.5 % — ABNORMAL LOW (ref 35.0–47.0)
Hemoglobin: 10.3 g/dL — ABNORMAL LOW (ref 12.0–16.0)
Lymphocytes Relative: 12 %
Lymphs Abs: 0.6 10*3/uL — ABNORMAL LOW (ref 1.0–3.6)
MCH: 29.8 pg (ref 26.0–34.0)
MCHC: 33.9 g/dL (ref 32.0–36.0)
MCV: 87.9 fL (ref 80.0–100.0)
Monocytes Absolute: 0.5 10*3/uL (ref 0.2–0.9)
Monocytes Relative: 10 %
Neutro Abs: 4.1 10*3/uL (ref 1.4–6.5)
Neutrophils Relative %: 76 %
Platelets: 348 10*3/uL (ref 150–440)
RBC: 3.46 MIL/uL — ABNORMAL LOW (ref 3.80–5.20)
RDW: 14.8 % — ABNORMAL HIGH (ref 11.5–14.5)
WBC: 5.4 10*3/uL (ref 3.6–11.0)

## 2016-10-30 LAB — COMPREHENSIVE METABOLIC PANEL
ALT: 26 U/L (ref 14–54)
AST: 42 U/L — ABNORMAL HIGH (ref 15–41)
Albumin: 3.7 g/dL (ref 3.5–5.0)
Alkaline Phosphatase: 118 U/L (ref 38–126)
Anion gap: 7 (ref 5–15)
BUN: 8 mg/dL (ref 6–20)
CO2: 25 mmol/L (ref 22–32)
Calcium: 8.4 mg/dL — ABNORMAL LOW (ref 8.9–10.3)
Chloride: 105 mmol/L (ref 101–111)
Creatinine, Ser: 0.54 mg/dL (ref 0.44–1.00)
GFR calc Af Amer: >60 mL/min (ref >60)
GFR calc non Af Amer: >60 mL/min (ref >60)
Glucose, Bld: 111 mg/dL — ABNORMAL HIGH (ref 65–99)
Potassium: 3.8 mmol/L (ref 3.5–5.1)
Sodium: 137 mmol/L (ref 135–145)
Total Bilirubin: 0.7 mg/dL (ref 0.3–1.2)
Total Protein: 7.3 g/dL (ref 6.5–8.1)

## 2016-10-30 MED ORDER — PACLITAXEL PROTEIN-BOUND CHEMO INJECTION 100 MG
100.0000 mg/m2 | Freq: Once | INTRAVENOUS | Status: AC
  Administered 2016-10-30: 175 mg via INTRAVENOUS
  Filled 2016-10-30: qty 35

## 2016-10-30 MED ORDER — PROMETHAZINE HCL 25 MG PO TABS
25.0000 mg | ORAL_TABLET | Freq: Four times a day (QID) | ORAL | Status: DC | PRN
  Administered 2016-10-30: 25 mg via ORAL
  Filled 2016-10-30: qty 1

## 2016-10-30 MED ORDER — HEPARIN SOD (PORK) LOCK FLUSH 100 UNIT/ML IV SOLN
500.0000 [IU] | Freq: Once | INTRAVENOUS | Status: AC
  Administered 2016-10-30: 500 [IU] via INTRAVENOUS
  Filled 2016-10-30: qty 5

## 2016-10-30 MED ORDER — SODIUM CHLORIDE 0.9% FLUSH
10.0000 mL | INTRAVENOUS | Status: DC | PRN
  Administered 2016-10-30: 10 mL via INTRAVENOUS
  Filled 2016-10-30: qty 10

## 2016-10-30 MED ORDER — SODIUM CHLORIDE 0.9 % IV SOLN
Freq: Once | INTRAVENOUS | Status: AC
  Administered 2016-10-30: 11:00:00 via INTRAVENOUS
  Filled 2016-10-30: qty 1000

## 2016-10-30 NOTE — Progress Notes (Signed)
[facility] day:  10/30/16   Chief Complaint: Melinda Tanner is a 46 y.o. 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   HPI:  The patient was last seen in the medical oncology clinic on 10/16/2016.  At that time, she was doing well.  She had no pain.  Nausea was well managed.  She received day 8 Abraxane.  She received Xgeva.  Head MRI for surveillance was ordered.  Head MRI on 10/27/2016 revealed 9 new small brain metastases since 05/2016. The largest was a 10 mm metastasis in the posterior right cerebellum. The smallest lesions were punctate, and 1 of these along the posterior inferior left cerebellar hemisphere was suspicious for leptomeningeal based disease.  There was no associated edema or mass effect.  Symptomatically, she notes some nausea this morning after eating breakfast.  She denies any visual changes, numbness, weakness, balance or coordination symptoms.  She denies any pain.  She has not had any pain medications since 10/13/2016.   Past Medical History:  Diagnosis Date  . Brain cancer ([place]) 2012   Met. from Breast  . Breast cancer ([place]) 2009  . Complication of anesthesia    nausea, "drops in potassium and magnesium"  . Seizures ([place])     Past Surgical History:  Procedure Laterality Date  . BRAIN SURGERY  2012, 2106  . CESAREAN SECTION    . LAPAROSCOPIC BILATERAL SALPINGO OOPHERECTOMY Bilateral 05/18/2015   Procedure: LAPAROSCOPIC BILATERAL SALPINGO OOPHORECTOMY;  Surgeon: Will Bonnet, MD;  Location: [facility] ORS;  Service: Gynecology;  Laterality: Bilateral;  . MASTECTOMY Bilateral 2010    Family History  Problem Relation Age of Onset  . Cancer Paternal Aunt   . Cancer Paternal Uncle   . Cancer Paternal Grandfather   . Hypertension Brother   . Diabetes Paternal Grandmother   A paternal aunt had breast cancer age 35, a  paternal uncle had prostate cancer, and a paternal grandfather had leukemia.  Social History:  reports that she has never smoked. She has never used smokeless tobacco. She reports that she does not drink alcohol or use drugs.  She is from [place].  She moved to [place] in 2015.  She moved to [place] in 04/2014.  She recently moved into the [place] area.  She has 2 children (boy and girl).  Her family will likely be moving to [facility] in December.  They are looking for a home.  She is going on a trip to [place] 10/18/2016 - 10/24/2016.  The patient is accompanied by her husband and the Spanish interpreter today.  Allergies:  Allergies  Allergen Reactions  . Taxol [Paclitaxel] Anaphylaxis  . Aspirin Swelling  . Penicillins Swelling  . Zofran [Ondansetron Hcl] Nausea And Vomiting    Current Medications: Current Outpatient Prescriptions  Medication Sig Dispense Refill  . omeprazole (PRILOSEC) 20 MG capsule Take 20 mg by mouth daily.  2  . pantoprazole (PROTONIX) 20 MG tablet Take 1 tablet by mouth daily 30 tablet 3  . promethazine (PHENERGAN) 25 MG tablet Take 1 tablet (25 mg total) by mouth every 6 (six) hours as needed for nausea or vomiting. 30 tablet 2  . capecitabine (XELODA) 150 MG tablet Take 1 tablet (150 mg total) by mouth 2 (two) times daily after a meal. Take with 500 mg prescription (Patient not taking: Reported on 09/29/2016) 28 tablet 0  .  capecitabine (XELODA) 500 MG tablet Take 3 tablets (1,500 mg total) by mouth 2 (two) times daily after a meal. Take with 150 mg prescription (Patient not taking: Reported on 09/29/2016) 84 tablet 0  . Cyanocobalamin (VITAMIN B-12 PO) Take by mouth.    . docusate sodium (COLACE) 100 MG capsule Take 1 tablet once or twice daily as needed for constipation while taking narcotic pain medicine (Patient not taking: Reported on 09/29/2016) 30 capsule 0  . fentaNYL (DURAGESIC - DOSED MCG/HR) 25 MCG/HR patch Place 1 patch (25 mcg total) onto  the skin every 3 (three) days. (Patient not taking: Reported on 10/30/2016) 10 patch 0  . HYDROcodone-acetaminophen (NORCO/VICODIN) 5-325 MG tablet Take 1-2 tablets by mouth every 4 (four) hours as needed for moderate pain. (Patient not taking: Reported on 09/25/2016) 30 tablet 0  . ibuprofen (ADVIL,MOTRIN) 800 MG tablet Take 800 mg by mouth every 8 (eight) hours as needed (Takes 1/2 tablet prn).    . Multiple Vitamins-Minerals (CENTRUM ADULTS PO) Take 1 tablet by mouth daily.    Marland Kitchen oxycodone (OXY-IR) 5 MG capsule Take 1 capsule (5 mg total) by mouth every 4 (four) hours as needed. (Patient not taking: Reported on 09/25/2016) 30 capsule 0  . oxyCODONE-acetaminophen (PERCOCET/ROXICET) 5-325 MG tablet Take 1 tablet by mouth every 4 (four) hours as needed for severe pain. (Patient not taking: Reported on 09/25/2016) 40 tablet 0  . palbociclib (IBRANCE) 100 MG capsule Take 1 capsule daily for 21 days and then take 1 week off and repeat cycle again (Take whole with food.) (Patient not taking: Reported on 09/25/2016) 21 capsule 1  . potassium chloride SA (K-DUR,KLOR-CON) 20 MEQ tablet Take 1 tablet (20 mEq total) by mouth 2 (two) times daily. for 1 days then 1 pill a day x 2 days. (Patient not taking: Reported on 09/29/2016) 10 tablet 0  . traMADol (ULTRAM) 50 MG tablet Take 50 mg by mouth every 12 (twelve) hours as needed.     No current facility-administered medications for this visit.    Facility-Administered Medications Ordered in Other Visits  Medication Dose Route Frequency Provider Last Rate Last Dose  . heparin lock flush 100 unit/mL  500 Units Intravenous Once Corcoran, Melissa C, MD      . sodium chloride flush (NS) 0.9 % injection 10 mL  10 mL Intravenous PRN Lequita Asal, MD   10 mL at 10/30/16 0920    Review of Systems:  GENERAL:  Feels "good".  No fevers or sweats.  Weight up 2 pounds. PERFORMANCE STATUS (ECOG):  1 HEENT:  No visual changes, runny nose, sore throat, mouth sores or  tenderness.  Excess saliva. Lungs: No shortness of breath.  Cough.  No hemoptysis. Cardiac:  No chest pain, palpitations, orthopnea, or PND. GI:  Nausea this AM with breakfast.  No diarrhea, constipation, melena or hematochezia.  GU:  No urgency, frequency, dysuria or hematuria. Musculoskeletal: No pain.  No muscle tenderness. Extremities:  No pain or swelling. Skin:  No rashes or irritation. Neuro:  No headache, numbness or weakness, balance or coordination issues.  Endocrine:  No diabetes, thyroid issues, or night sweats.  Hot flashes. Psych:  No mood changes, depression or anxiety. Pain:  No pain pills since 10/13/2016 (see HPI). Review of systems:  All other systems reviewed and found to be negative.  Physical Exam: Blood pressure 119/84, pulse (!) 101, temperature 98.6 F (37 C), temperature source Tympanic, resp. rate 18, weight 151 lb (68.5 kg), last menstrual period 03/19/2015.  GENERAL:  Well developed, well nourished, woman sitting comfortably in the exam room in no acute distress.  Intermittent cough. MENTAL STATUS:  Alert and oriented to person, place and time. HEAD: Wearing a brown scarf.  Short black hair.  Normocephalic, atraumatic, face symmetric, no Cushingoid features. EYES:  Brown eyes. Pupils equal round and reactive to light and accomodation.  No conjunctivitis or scleral icterus. ENT:  Oropharynx clear without lesion.  Tongue normal. Mucous membranes moist.  RESPIRATORY:  Decreased breath sounds right base (old secondary to elevated hemidiaphragm).  Clear to auscultation without rales, wheezes or rhonchi. CARDIOVASCULAR:  Regular rate and rhythm without murmur, rub or gallop. ABDOMEN:  Soft, non-tender, with active bowel sounds, and no hepatosplenomegaly.  No masses. SKIN:  No rashes, bruises or ulcers. EXTREMITIES:  No edema, no skin discoloration or tenderness.  No palpable cords. LYMPH NODES:  No palpable cervical, supraclavicular, axillary or inguinal adenopathy   NEUROLOGICAL:  Appropriate. PSYCH:  Appropriate.     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 No lymph invasion. Tumor cellularity 20%. [id number]. 07/13/2008: Left mastectomy: Residual ductal carcinoma in situ, high nuclear grade, deep margin negative ypTisNO(sn)MX. 10/25/2010: Abdominal and pelvic ultrasound: questionable lesion near gallbladder fossa recommend MRI follow up. 03/02/2011: Left and right frontal excisional brain biopsy: Adenocarcinoma, metastatic, NOS: ER +, PR 5% +, Her 2 NEG. 03/04/2011: [facility] testing of IHC4 residual recurrence risk score of 114 showing an 8 year recurrence reate of 57%. ER POSTIVE, PR POSITIVE, HER 2 POSTIVE (previously documented negative in 2009) cutoff of 361 patient scores 426. ki67 71%.  08/05/2013: Right breast FNA supraclavicular region: positive for metastatic carcinoma. ER/PR/HER2 not reported. 08/19/2014: Right cervical lymph node. Adenocarcinoma with features consistent with metastatic breast carcinoma. ER/PR/HER2 insufficient tissue. 09/04/2014: Repeat craniotomy, resection of right frontal tumor (metastatic breast cancer). ER 90-100% PR 51-60% HER2 - 09/29/2014: Right cervical lymph node, metastatic carcinoma c/w breast primary, ER 100% PR 20% HER2-  Imaging: 08/15/2007: Bilateral Mammogram: Dense breasts with solid lesion at 6:00, 7:00, and 9:00 of right breast, solid lesion at 2:00 position left breast BIRADS 4B. 09/10/2007: Breast MRI: Three solid irregular enhancing lesions of the right breast 2.1 x 3.2, 2.8 x 2.7, and 1.8 x 1.6 cm. Mildly enlarged enhancing nodule right axilla.  Left breast clumped enhancement midportion suspicious for malignancy. 03/01/2011: Brain MRI: At least two juxtacortical, intra-axial masses with extensive surrounding vasogenic edema most consistent with intracranial metastasis. 07/22/2013: Brain MRI +/- contrast: interval increase in enhancing component on T2 FLAIR now measuring 1.0cm x 1.1 cm worrisome for progression of disease. 01/21/2014:  PET scan: Hypermetabolic small right supraclavicular and right upper paratracheal lymph nodes suspicious for mets. Hypermetabolic lymph node in the right paratracheal upper mediastinum that measure approximately 0.9 x 0.5cm. (of note no impressions made of areas noted on brain MRI 07/22/13).  01/21/2014: Head MRI: Right frontal enhancing lesion has shown interval increase in size with surrounding edema and local mass effect (measured 1.8x2.1x1.6, previously 1.2x1.1x1.0) 07/16/2014: Head MRI:  Right anterior frontal enhancing mass 2.5 x 2.0 cm compatible with a metastatic lesion has increased and changed in configuration from previous 2.0 x 1.8 cm, formerly multilobular. 08/06/2014: PET scan:  Multiple bilateral FDG avid low  cervical, supraclavicular, upper mediastinal, and right internal mammary lymphadenopathy, likely representing metastatic disease. A cervical node in the right lower neck should be amenable to percutaneous sampling.  12/2014: Head MRI:  Evolution of right frontal postoperative changes status post tumor resection at the vertex. Expected postoperative changes. Minimal marginal enhancement resection bed with some adjacent posterior intrinsic T1 signal again seen. Decreasing T2/FLAIR adjacent parenchymal changes.  Stable resection cavity is left posterior frontal and right parieto-occipital regions. 04/20/2015 : PET scan: Cervical and thoracic nodal metastasis.  There was multifocal osseous metastasis (left transverse T4 process, left humeral head, right iliac wing, and left side of the sacrum).   Incidental findings included right nephrolithiasis.   04/22/2015: Bone scan: Corresponded with PET-CT with metastatic lesions evident in the left humeral head, left posterior aspect of T4, left aspect of S1, and the right iliac crest.  There was no other definite foci of metastatic disease to bone.  Uptake in the skull was most suggestive of the previous right frontal craniotomy. 05/24/2015: Bone density:  T-score of -0.8 in the right femoral neck (normal). 07/07/2015:  Head MRI:  Metastatic breast cancer with 3 resection cavities. The anterior right frontal cavity was positive for nodular marginal enhancement, new from brain MRI report 01/07/2015, and consistent with recurrent disease.  08/12/2015:  Lumbar Spine MRI: Osseous metastatic disease at L3, L4, L5, S1, and in the left iliac bone posteriorly. There was no definite epidural metastatic disease. 08/20/2015:  PET scan:  Response to therapy in the lower cervical and thoracic adenopathy.  There was a mixed response to multifocal osseous metastases (some new lesions).  12/06/2015:  Head MRI:  Decreased nodular contrast enhancement at the right frontal lobe treatment site.  There was unchanged appearance of treatment sites within the bilateral parietal lobe size without residual or recurrent disease.  There were no new metastatic lesions. 02/18/2016: PET scan: Mixed response to therapy. There hadbeen improvement in right cervical lymphadenopathy and in several osseous lesions. There wereseveral other osseous lesions in the pelvis with increasing hypermetabolism compared to the prior study. As the majority of the osseous lesions wereincreasingly sclerotic, some of this hypermetabolism could simply reflect bony healing. There was no new extraskeletal metastatic disease is noted in the neck, chest, abdomen or pelvis. 03/06/2016: Head MRI: Unchanged small focus of nodular enhancement at the right frontal resection site. There was unchanged appearance  of the 2 other resection sites without abnormal enhancement. There was no evidence of new intracranial metastases. 05/23/2016:  Bone scan:  Stable focus of abnormal uptake seen in right frontal skull consistent with prior craniotomy.  There were areas of abnormal uptake identified in the left humeral head, T4 and S1 levels and right iliac crest that were present but decreased in intensity compared to prior exam suggesting improvement.  There was a new foci of abnormal uptake are noted in a right lower rib,T12 and L1 levels of spine concerning for metastatic disease. 06/07/2016:  Thoracic and lumbar spine MRI:  multiple low T1/T2 weighted signal, non enhancing lesions within the thoracic spine, which corresponded in size and location to sclerotic lesions demonstrated on the PET CT of 02/18/2016.  There were no new thoracic lesions.  There was minimal residual contrast enhancement within the L5 vertebral body metastatic lesion, decreased compared to the MRI of 08/12/2015.  There was minimal contrast enhancement at the periphery of the lesion within the L1 vertebral body. This lesion was new compared to MRI of 08/12/2015, but unchanged in size and location  compared to PET CT of 02/18/2016.  There was no epidural disease, spinal canal stenosis or neural foraminal encroachment. 06/13/2016:  Head MRI revealed no evidence of residual or recurrent disease. 08/11/2016:  Chest, abdomen, and pelvic CT revealed  new 8 mm right supraclavicular, 12 mm right paratracheal and 10 mm subcarinal lymphadenopathy suspicious for metastatic nodal recurrence.  There was extensive patchy sclerotic osseous metastases throughout the axial and proximal appendicular skeleton appear stable in size and distribution although generally increased in sclerosis since 02/18/2016 PET-CT, which was a nonspecific change that could reflect treatment effect or progression.  There was no additional sites of metastatic disease in the chest, abdomen or  pelvis. 08/11/2016:  Bone scan revealed scattered foci of abnormal osseous tracer accumulation consistent with osseous metastatic disease with new sites of abnormal uptake in the thoracic spine at T5 in the posterior LEFT sixth rib. 08/31/2016:  Chest CT angiogram revealed interval confluent opacity in the posterior aspect of the right upper lobe suspicious for pneumonia. There was a small right-sided pleural effusion and adjacent right lower lobe atelectasis. 10/27/2016:  Head MRI revealed 9 new small brain metastases since 05/2016. The largest was a 10 mm metastasis in the posterior right cerebellum. The smallest lesions were punctate, and 1 of these along the posterior inferior left cerebellar hemisphere was suspicious for leptomeningeal based disease.  There was no associated edema or mass effect.   Labs:  Infusion on 10/30/2016  Component Date Value Ref Range Status  . WBC 10/30/2016 5.4  3.6 - 11.0 K/uL Final  . RBC 10/30/2016 3.46* 3.80 - 5.20 MIL/uL Final  . Hemoglobin 10/30/2016 10.3* 12.0 - 16.0 g/dL Final  . HCT 10/30/2016 30.5* 35.0 - 47.0 % Final  . MCV 10/30/2016 87.9  80.0 - 100.0 fL Final  . MCH 10/30/2016 29.8  26.0 - 34.0 pg Final  . MCHC 10/30/2016 33.9  32.0 - 36.0 g/dL Final  . RDW 10/30/2016 14.8* 11.5 - 14.5 % Final  . Platelets 10/30/2016 348  150 - 440 K/uL Final  . Neutrophils Relative % 10/30/2016 76  % Final  . Neutro Abs 10/30/2016 4.1  1.4 - 6.5 K/uL Final  . Lymphocytes Relative 10/30/2016 12  % Final  . Lymphs Abs 10/30/2016 0.6* 1.0 - 3.6 K/uL Final  . Monocytes Relative 10/30/2016 10  % Final  . Monocytes Absolute 10/30/2016 0.5  0.2 - 0.9 K/uL Final  . Eosinophils Relative 10/30/2016 1  % Final  . Eosinophils Absolute 10/30/2016 0.1  0 - 0.7 K/uL Final  . Basophils Relative 10/30/2016 1  % Final  . Basophils Absolute 10/30/2016 0.0  0 - 0.1 K/uL Final  . Sodium 10/30/2016 137  135 - 145 mmol/L Final  . Potassium 10/30/2016 3.8  3.5 - 5.1 mmol/L Final   . Chloride 10/30/2016 105  101 - 111 mmol/L Final  . CO2 10/30/2016 25  22 - 32 mmol/L Final  . Glucose, Bld 10/30/2016 111* 65 - 99 mg/dL Final  . BUN 10/30/2016 8  6 - 20 mg/dL Final  . Creatinine, Ser 10/30/2016 0.54  0.44 - 1.00 mg/dL Final  . Calcium 10/30/2016 8.4* 8.9 - 10.3 mg/dL Final  . Total Protein 10/30/2016 7.3  6.5 - 8.1 g/dL Final  . Albumin 10/30/2016 3.7  3.5 - 5.0 g/dL Final  . AST 10/30/2016 42* 15 - 41 U/L Final  . ALT 10/30/2016 26  14 - 54 U/L Final  . Alkaline Phosphatase 10/30/2016 118  38 - 126 U/L Final  .  Total Bilirubin 10/30/2016 0.7  0.3 - 1.2 mg/dL Final  . GFR calc non Af Amer 10/30/2016 >60  >60 mL/min Final  . GFR calc Af Amer 10/30/2016 >60  >60 mL/min Final   Comment: (NOTE) The eGFR has been calculated using the CKD EPI equation. This calculation has not been validated in all clinical situations. eGFR's persistently <60 mL/min signify possible Chronic Kidney Disease.   . Anion gap 10/30/2016 7  5 - 15 Final    Assessment:  Melinda Tanner is a 46 y.o. 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  She initially presented in 2009 while living in [place] with multi-focal right breast cancer with positive lymph node(s) which was ER+, PR+(low), and HER2/neu+. She received neoadjuvant chemotherapy (AC x 4 every 3 weeks followed by Taxol/Abraxane + carboplatin weekly x 12) without anti-HER2 treatment. BRCA1/2 testing was negative on 08/15/2016.  On 07/14/2008, she underwent bilateral mastectomies followed by reconstruction.  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   Zero of 11 lymph nodes on the right were positive for malignancy. 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 Deep margin was negative. Zero of 2 lymph nodes were positive for metastatic disease.  She received adjuvant radiation to the right breast.  She received adjuvant tamoxifen and Zoladex.  She could not tolerate  symptoms of joint pain and tamoxifen was discontinued after 1-2 months.  Zoladex was continued for approximately 1.5 years.  She was diagnosed with brain metastases in 02/2011.  Pathology revealed ER+, PR+(low), and HER2/neu-.  She underwent resection followed by [facility].  On 03/09/2011, original breast tissue (09/03/2007) sent to [facility] testing.  Testing revealed ER+, PR+(borderline), and HER2/neu positive (different than initial testing).  She received Herceptin for 1 year beginning in 2013.  She then began Tykerb and Xeloda after completion of Herceptin (2014).  She was on extremely low, and probably sub-therapeutic, dosing of lapatinib + capecitabine.   She developed right supraclavicular adenopathy in 2015. She was noted to have slow disease progression in the right frontal/parietal lesion with minimal presence of systemic disease on PET scans. Capecitabine + lapatinib were discontinued in 04/2014.   She underwent repeat craniotomy with resection of brain metastasis on 09/04/2014.  Biopsy of the right supraclavicular lymph node was ER+,PR+,HER2/neu-.  She started tamoxifen in 09/2014, but discontinued it secondary to pain in hip, back, and shoulder.  She restarted tamoxifen on 04/23/2015.  Tamoxifen was discontinued on 08/23/2015 secondary to progressive disease.  She received Zoladex on 04/30/2015.  She underwent laparoscopic bilateral oophorectomy on 05/18/2015.  She receives monthly Xgeva (began 04/30/2015; last 10/16/2016).  She is post-menopausal.  She began Faslodex on 08/23/2015 (last 04/04/2016).  She received 7 cycles of Ibrance (09/20/2015 - 06/18/2016).  Patient received palliative radiation 30 Gy to the left humerus and lumbar spine from 08/25/2015 - 09/09/2015.  She is not eligible for a clinical trial.  CA27.29 has been followed: 177.8 on 04/16/2015, 276.4 on 05/28/2015, 287.5 on 07/13/2015, 333.8 on 07/26/2015, 412.9 on 08/23/2015, 315.6 on 09/20/2015, 188.8 on  10/18/2015, 151.5 on 11/15/2015, 122.7 on 12/10/2015, 94.6 on 01/10/2016, 80.9 on 02/07/2016, 79 on 03/06/2016, 84.8 on 04/04/2016, 85.2 on 05/02/2016, 91.1 on 05/30/2016, 121 on 06/30/2016, 182.2 on 07/31/2016, 420.5 on 10/09/2016, and 564.6 on 10/30/2016.  Head MRI on 06/13/2016 revealed no evidence of residual or recurrent disease.  Head MRI on 10/27/2016 revealed 9 new small brain metastases since 05/2016. The largest was  a 10 mm metastasis in the posterior right cerebellum. The smallest lesions were punctate, and 1 of these along the posterior inferior left cerebellar hemisphere was suspicious for leptomeningeal based disease.  There was no associated edema or mass effect.  Chest, abdomen, and pelvic CT on 08/11/2016 revealed  new 8 mm right supraclavicular, 12 mm right paratracheal and 10 mm subcarinal lymphadenopathy suspicious for metastatic nodal recurrence.  There was extensive patchy sclerotic osseous metastases throughout the axial and proximal appendicular skeleton appear stable in size and distribution although generally increased in sclerosis since 02/18/2016 PET-CT, which was a nonspecific change that could reflect treatment effect or progression.  There was no additional sites of metastatic disease in the chest, abdomen or pelvis.  Chest CT angiogram on 08/31/2016 revealed interval confluent opacity in the posterior aspect of the right upper lobe suspicious for pneumonia. There was a small right-sided pleural effusion and adjacent right lower lobe atelectasis.  She was treated with Levaquin.  Bone scan on 08/11/2016 revealed scattered foci of abnormal osseous tracer accumulation consistent with osseous metastatic disease with new sites of abnormal uptake in the thoracic spine at T5 in the posterior LEFT sixth rib.  Bone density study on 05/24/2015 revealed a T-score of -0.8 in the right femoral neck (normal).  She completed palliative radiation to T12-L1 of 3000 cGy (06/21/2016 -  07/04/2016).  She began palliative radiation to the left hip from 08/17/2016 - 08/30/2016.  She is on a Fentanyl 25 mcg/hr patch.  Pain is well controlled.  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 Tumor was ER positive (> 90%) PR negative and HER-2/neu negative (1+ IHC).    Xeloda is cost prohibitive.  She does not wish to pursue navelbine or gemcitabine.  She is s/p 1 cycle of Abraxane (10/09/2016).  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  Work-up on 10/25/2015 revealed a B12 of 141 (low).  B12 was 205 on 12/13/2015 with an MMA of 171 (normal) on 01/10/2016.  Ferritin was 43. Iron studies included a saturation of 13% and a TIBC of 354. TSH and folate were normal.  Reticulocyte count was 2.2%.  She declined B12 injections.  She started oral B12 1000 mcg on 11/05/2015.  She takes her B12 sporadically.  B12 level was 205 (low normal) on 12/13/2015.  Diet is good.  She denies any melena or hematochezia.  Symptomatically, she denies any pain.  Exam is stable.  Plan: 1.  Labs today: CBC with diff, CMP, CA27.29. 2.  Review interval head MRI.  Discuss new metastasis.  Discuss referral to radiation oncology.  Discuss holding chemotherapy during whole brain radiation. 3.  Day 1 of cycle #2 Abraxane today. 4.  Radiation Oncology consult today. 5.  RTC on 11/13/2016 for MD assessment, labs (CBC with diff, BMP), and Xgeva.   Lequita Asal, MD  10/30/2016, 9:54 AM

## 2016-10-30 NOTE — Progress Notes (Signed)
Radiation Oncology Follow up Note Old patient new area  Name: Melinda Tanner   Date:   10/30/2016 MRN:  [id number] DOB: [birth year]    This 46 y.o. 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  REFERRING PROVIDER: Theotis Burrow*  HPI: patient is a 46 year old female well known to our department having been treated multiple courses for palliative radiation therapy. 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 She developed in 2013 3 separate brain metastasis and left frontal parietal and anterior right frontal received CyberKnife therapy. Those treatments were performed in [place]. She developed.recurrent disease in her right frontal lobe which was resected at [facility] and again consistent with metastatic breast cancer. She then presented here in May 2017 with progressive disease in the resection cavity underwent 3000 cGy in 10 fractions using I am RT treatment planning and delivery.she is recently completed palliative ration therapy to her left hip for metastatic disease. Recently has developed some headacheswith MRI scan demonstrating at least 9 new small brain metastasis with one lesion along the posterior inferior left cerebral hemisphere suspicious for leptomeningeal-based disease. She has no significant edema or mass effect.she is currently undergoing Abraxane chemotherapy as well as X [place]. She was having some crude visual field deficits mostly in her peripheral vision but that has improved. Other than the left frontal headaches no other neurologic complaints noted.  COMPLICATIONS OF TREATMENT: none  FOLLOW UP COMPLIANCE: keeps appointments   PHYSICAL EXAM:  LMP 03/19/2015 Comment: Partial Hyst Crude visual fields are within normal range proprioception is intact motor sensory and DTR levels are equal and symmetric in the  upper lower extremities. Well-developed well-nourished patient in NAD. HEENT reveals PERLA, EOMI, discs not visualized.  Oral cavity is clear. No oral mucosal lesions are identified. Neck is clear without evidence of cervical or supraclavicular adenopathy. Lungs are clear to A&P. Cardiac examination is essentially unremarkable with regular rate and rhythm without murmur rub or thrill. Abdomen is benign with no organomegaly or masses noted. Motor sensory and DTR levels are equal and symmetric in the upper and lower extremities. Cranial nerves II through XII are grossly intact. Proprioception is intact. No peripheral adenopathy or edema is identified. No motor or sensory levels are noted. Crude visual fields are within normal range.  RADIOLOGY RESULTS: MRI scan of brain reviewed and compatible with the above-stated findings  PLAN: at this time I to go ahead with a course of whole brain radiation. Would plan on delivering 3000 cGy in 12 fractions. Would reduce the dose slightly based on her prior radiation therapy although I believe based on the amount of time interval over the course of all her radiation therapy treatments to her brain side effects will be minimal. I did discuss that there may be some increased side effects secondary to multiple courses of brain radiation therapy and previous surgery. Patient and husband have agreed to go through therapy and I have personally set up and ordered CT simulation for this week. We'll also keep the patient on steroids during treatment. Case was discussed with medical oncology. She will be on a short treatment break from chemotherapy during radiation and resume once that is over.  I would like to take this opportunity to thank you for allowing me to participate in the care of your patient.Armstead Peaks., MD

## 2016-10-30 NOTE — Progress Notes (Signed)
Patient states she had a little nausea this morning but after eating breakfast she felt fine.  She remembers a conversation that mentioned she should take Calcium and Vitamin D.  She plans to start those meds.  She has no pain today.  BM this morning was diarrhea.

## 2016-10-31 LAB — CANCER ANTIGEN 27.29: CA 27.29: 564.6 U/mL — ABNORMAL HIGH (ref 0.0–38.6)

## 2016-11-02 ENCOUNTER — Ambulatory Visit
Admission: RE | Admit: 2016-11-02 | Discharge: 2016-11-02 | Disposition: A | Discharge status: Home / Self Care | Payer: Medicare HMO | Source: Ambulatory Visit | Attending: Radiation Oncology | Admitting: Radiation Oncology

## 2016-11-02 ENCOUNTER — Other Ambulatory Visit: Payer: Self-pay | Admitting: *Deleted

## 2016-11-02 DIAGNOSIS — Z9221 Personal history of antineoplastic chemotherapy: Secondary | ICD-10-CM | POA: Diagnosis not present

## 2016-11-02 DIAGNOSIS — C7951 Secondary malignant neoplasm of bone: Secondary | ICD-10-CM | POA: Diagnosis not present

## 2016-11-02 DIAGNOSIS — C7931 Secondary malignant neoplasm of brain: Secondary | ICD-10-CM | POA: Diagnosis not present

## 2016-11-02 DIAGNOSIS — Z510 Encounter for antineoplastic radiation therapy: Secondary | ICD-10-CM | POA: Diagnosis present

## 2016-11-02 DIAGNOSIS — C50911 Malignant neoplasm of unspecified site of right female breast: Secondary | ICD-10-CM | POA: Diagnosis not present

## 2016-11-02 DIAGNOSIS — Z170 Estrogen receptor positive status [ER+]: Secondary | ICD-10-CM | POA: Diagnosis not present

## 2016-11-02 MED ORDER — DEXAMETHASONE 4 MG PO TABS: 4.0000 mg | ORAL_TABLET | Freq: Every day | ORAL | 0 refills | Status: AC

## 2016-11-03 DIAGNOSIS — C7951 Secondary malignant neoplasm of bone: Secondary | ICD-10-CM | POA: Diagnosis not present

## 2016-11-03 DIAGNOSIS — C7931 Secondary malignant neoplasm of brain: Secondary | ICD-10-CM | POA: Diagnosis not present

## 2016-11-03 DIAGNOSIS — Z9221 Personal history of antineoplastic chemotherapy: Secondary | ICD-10-CM | POA: Diagnosis not present

## 2016-11-03 DIAGNOSIS — C50911 Malignant neoplasm of unspecified site of right female breast: Secondary | ICD-10-CM | POA: Diagnosis not present

## 2016-11-03 DIAGNOSIS — Z510 Encounter for antineoplastic radiation therapy: Secondary | ICD-10-CM | POA: Diagnosis not present

## 2016-11-03 DIAGNOSIS — Z170 Estrogen receptor positive status [ER+]: Secondary | ICD-10-CM | POA: Diagnosis not present

## 2016-11-07 ENCOUNTER — Ambulatory Visit
Admission: RE | Admit: 2016-11-07 | Discharge: 2016-11-07 | Disposition: A | Discharge status: Home / Self Care | Payer: Medicare HMO | Source: Ambulatory Visit | Attending: Radiation Oncology | Admitting: Radiation Oncology

## 2016-11-07 DIAGNOSIS — C7931 Secondary malignant neoplasm of brain: Secondary | ICD-10-CM | POA: Diagnosis not present

## 2016-11-07 DIAGNOSIS — Z510 Encounter for antineoplastic radiation therapy: Secondary | ICD-10-CM | POA: Diagnosis not present

## 2016-11-07 DIAGNOSIS — C7951 Secondary malignant neoplasm of bone: Secondary | ICD-10-CM | POA: Diagnosis not present

## 2016-11-07 DIAGNOSIS — Z170 Estrogen receptor positive status [ER+]: Secondary | ICD-10-CM | POA: Diagnosis not present

## 2016-11-07 DIAGNOSIS — C50911 Malignant neoplasm of unspecified site of right female breast: Secondary | ICD-10-CM | POA: Diagnosis not present

## 2016-11-07 DIAGNOSIS — Z9221 Personal history of antineoplastic chemotherapy: Secondary | ICD-10-CM | POA: Diagnosis not present

## 2016-11-08 ENCOUNTER — Ambulatory Visit
Admission: RE | Admit: 2016-11-08 | Discharge: 2016-11-08 | Disposition: A | Discharge status: Home / Self Care | Payer: Medicare HMO | Source: Ambulatory Visit | Attending: Radiation Oncology | Admitting: Radiation Oncology

## 2016-11-08 DIAGNOSIS — C50911 Malignant neoplasm of unspecified site of right female breast: Secondary | ICD-10-CM | POA: Diagnosis not present

## 2016-11-08 DIAGNOSIS — Z170 Estrogen receptor positive status [ER+]: Secondary | ICD-10-CM | POA: Diagnosis not present

## 2016-11-08 DIAGNOSIS — C7931 Secondary malignant neoplasm of brain: Secondary | ICD-10-CM | POA: Diagnosis not present

## 2016-11-08 DIAGNOSIS — C7951 Secondary malignant neoplasm of bone: Secondary | ICD-10-CM | POA: Diagnosis not present

## 2016-11-08 DIAGNOSIS — Z510 Encounter for antineoplastic radiation therapy: Secondary | ICD-10-CM | POA: Diagnosis not present

## 2016-11-08 DIAGNOSIS — Z9221 Personal history of antineoplastic chemotherapy: Secondary | ICD-10-CM | POA: Diagnosis not present

## 2016-11-09 ENCOUNTER — Ambulatory Visit
Admission: RE | Admit: 2016-11-09 | Discharge: 2016-11-09 | Disposition: A | Discharge status: Home / Self Care | Payer: Medicare HMO | Source: Ambulatory Visit | Attending: Radiation Oncology | Admitting: Radiation Oncology

## 2016-11-09 DIAGNOSIS — C7951 Secondary malignant neoplasm of bone: Secondary | ICD-10-CM | POA: Diagnosis not present

## 2016-11-09 DIAGNOSIS — C7931 Secondary malignant neoplasm of brain: Secondary | ICD-10-CM | POA: Diagnosis not present

## 2016-11-09 DIAGNOSIS — Z510 Encounter for antineoplastic radiation therapy: Secondary | ICD-10-CM | POA: Diagnosis not present

## 2016-11-09 DIAGNOSIS — C50911 Malignant neoplasm of unspecified site of right female breast: Secondary | ICD-10-CM | POA: Diagnosis not present

## 2016-11-09 DIAGNOSIS — Z9221 Personal history of antineoplastic chemotherapy: Secondary | ICD-10-CM | POA: Diagnosis not present

## 2016-11-09 DIAGNOSIS — Z170 Estrogen receptor positive status [ER+]: Secondary | ICD-10-CM | POA: Diagnosis not present

## 2016-11-10 ENCOUNTER — Ambulatory Visit
Admission: RE | Admit: 2016-11-10 | Discharge: 2016-11-10 | Disposition: A | Discharge status: Home / Self Care | Payer: Medicare HMO | Source: Ambulatory Visit | Attending: Radiation Oncology | Admitting: Radiation Oncology

## 2016-11-10 DIAGNOSIS — Z9221 Personal history of antineoplastic chemotherapy: Secondary | ICD-10-CM | POA: Diagnosis not present

## 2016-11-10 DIAGNOSIS — C7951 Secondary malignant neoplasm of bone: Secondary | ICD-10-CM | POA: Diagnosis not present

## 2016-11-10 DIAGNOSIS — C7931 Secondary malignant neoplasm of brain: Secondary | ICD-10-CM | POA: Diagnosis not present

## 2016-11-10 DIAGNOSIS — C50911 Malignant neoplasm of unspecified site of right female breast: Secondary | ICD-10-CM | POA: Diagnosis not present

## 2016-11-10 DIAGNOSIS — Z170 Estrogen receptor positive status [ER+]: Secondary | ICD-10-CM | POA: Diagnosis not present

## 2016-11-10 DIAGNOSIS — Z510 Encounter for antineoplastic radiation therapy: Secondary | ICD-10-CM | POA: Diagnosis not present

## 2016-11-13 ENCOUNTER — Inpatient Hospital Stay: Payer: Medicare HMO

## 2016-11-13 ENCOUNTER — Encounter: Payer: Self-pay | Admitting: Hematology and Oncology

## 2016-11-13 ENCOUNTER — Inpatient Hospital Stay (HOSPITAL_BASED_OUTPATIENT_CLINIC_OR_DEPARTMENT_OTHER): Payer: Medicare HMO | Admitting: Hematology and Oncology

## 2016-11-13 ENCOUNTER — Ambulatory Visit
Admission: RE | Admit: 2016-11-13 | Discharge: 2016-11-13 | Disposition: A | Discharge status: Home / Self Care | Payer: Medicare HMO | Source: Ambulatory Visit | Attending: Radiation Oncology | Admitting: Radiation Oncology

## 2016-11-13 VITALS — BP 116/80 | HR 80 | Temp 98.3°F | Resp 18 | Wt 150.6 lb

## 2016-11-13 DIAGNOSIS — C7931 Secondary malignant neoplasm of brain: Secondary | ICD-10-CM

## 2016-11-13 DIAGNOSIS — Z7189 Other specified counseling: Secondary | ICD-10-CM

## 2016-11-13 DIAGNOSIS — E538 Deficiency of other specified B group vitamins: Secondary | ICD-10-CM

## 2016-11-13 DIAGNOSIS — Z79811 Long term (current) use of aromatase inhibitors: Secondary | ICD-10-CM

## 2016-11-13 DIAGNOSIS — C50919 Malignant neoplasm of unspecified site of unspecified female breast: Secondary | ICD-10-CM

## 2016-11-13 DIAGNOSIS — Z5111 Encounter for antineoplastic chemotherapy: Secondary | ICD-10-CM | POA: Diagnosis not present

## 2016-11-13 DIAGNOSIS — Z923 Personal history of irradiation: Secondary | ICD-10-CM

## 2016-11-13 DIAGNOSIS — Z9013 Acquired absence of bilateral breasts and nipples: Secondary | ICD-10-CM

## 2016-11-13 DIAGNOSIS — C50911 Malignant neoplasm of unspecified site of right female breast: Secondary | ICD-10-CM

## 2016-11-13 DIAGNOSIS — G893 Neoplasm related pain (acute) (chronic): Secondary | ICD-10-CM

## 2016-11-13 DIAGNOSIS — C770 Secondary and unspecified malignant neoplasm of lymph nodes of head, face and neck: Secondary | ICD-10-CM | POA: Diagnosis not present

## 2016-11-13 DIAGNOSIS — Z9221 Personal history of antineoplastic chemotherapy: Secondary | ICD-10-CM | POA: Diagnosis not present

## 2016-11-13 DIAGNOSIS — Z170 Estrogen receptor positive status [ER+]: Secondary | ICD-10-CM

## 2016-11-13 DIAGNOSIS — C7951 Secondary malignant neoplasm of bone: Secondary | ICD-10-CM

## 2016-11-13 DIAGNOSIS — D649 Anemia, unspecified: Secondary | ICD-10-CM

## 2016-11-13 DIAGNOSIS — Z510 Encounter for antineoplastic radiation therapy: Secondary | ICD-10-CM | POA: Diagnosis not present

## 2016-11-13 LAB — CBC WITH DIFFERENTIAL/PLATELET
Basophils Absolute: 0 10*3/uL (ref 0–0.1)
Basophils Relative: 0 %
Eosinophils Absolute: 0 10*3/uL (ref 0–0.7)
Eosinophils Relative: 0 %
HCT: 31.7 % — ABNORMAL LOW (ref 35.0–47.0)
Hemoglobin: 10.6 g/dL — ABNORMAL LOW (ref 12.0–16.0)
Lymphocytes Relative: 5 %
Lymphs Abs: 0.6 10*3/uL — ABNORMAL LOW (ref 1.0–3.6)
MCH: 29.3 pg (ref 26.0–34.0)
MCHC: 33.4 g/dL (ref 32.0–36.0)
MCV: 87.6 fL (ref 80.0–100.0)
Monocytes Absolute: 0.5 10*3/uL (ref 0.2–0.9)
Monocytes Relative: 5 %
Neutro Abs: 9.8 10*3/uL — ABNORMAL HIGH (ref 1.4–6.5)
Neutrophils Relative %: 90 %
Platelets: 368 10*3/uL (ref 150–440)
RBC: 3.62 MIL/uL — ABNORMAL LOW (ref 3.80–5.20)
RDW: 15.7 % — ABNORMAL HIGH (ref 11.5–14.5)
WBC: 10.9 10*3/uL (ref 3.6–11.0)

## 2016-11-13 LAB — BASIC METABOLIC PANEL
Anion gap: 9 (ref 5–15)
BUN: 12 mg/dL (ref 6–20)
CO2: 26 mmol/L (ref 22–32)
Calcium: 8.9 mg/dL (ref 8.9–10.3)
Chloride: 100 mmol/L — ABNORMAL LOW (ref 101–111)
Creatinine, Ser: 0.6 mg/dL (ref 0.44–1.00)
GFR calc Af Amer: >60 mL/min (ref >60)
GFR calc non Af Amer: >60 mL/min (ref >60)
Glucose, Bld: 127 mg/dL — ABNORMAL HIGH (ref 65–99)
Potassium: 3.9 mmol/L (ref 3.5–5.1)
Sodium: 135 mmol/L (ref 135–145)

## 2016-11-13 MED ORDER — DENOSUMAB 120 MG/1.7ML ~~LOC~~ SOLN
120.0000 mg | Freq: Once | SUBCUTANEOUS | Status: AC
  Administered 2016-11-13: 120 mg via SUBCUTANEOUS
  Filled 2016-11-13: qty 1.7

## 2016-11-13 NOTE — Progress Notes (Signed)
[facility] day:  11/13/16   Chief Complaint: Melinda Tanner is a 46 y.o. 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   HPI:  The patient was last seen in the medical oncology clinic on 10/30/2016.  At that time, she denied any pain.  Head MRI on 10/27/2016 revealed 9 small brain metastasis.  She received day 1 of cycle #2 Abraxane.  She was seen by radiation oncology.  Plan is for 3000 cGy in 12 fractions of whole brain radiation.  She began radiation on 11/08/2016.  Symptomatically, patient feels well. She has not had any pain medications since 10/13/2016.  She denies any physical complaints of anything today. She denies any visual changes, numbness, weakness, balance or coordination symptoms.  Patient is scheduled for 4th radiation fraction after her clinic visit today.    Past Medical History:  Diagnosis Date  . Brain cancer ([place]) 2012   Met. from Breast  . Breast cancer ([place]) 2009  . Complication of anesthesia    nausea, "drops in potassium and magnesium"  . Seizures ([place])     Past Surgical History:  Procedure Laterality Date  . BRAIN SURGERY  2012, 2106  . CESAREAN SECTION    . LAPAROSCOPIC BILATERAL SALPINGO OOPHERECTOMY Bilateral 05/18/2015   Procedure: LAPAROSCOPIC BILATERAL SALPINGO OOPHORECTOMY;  Surgeon: Will Bonnet, MD;  Location: [facility] ORS;  Service: Gynecology;  Laterality: Bilateral;  . MASTECTOMY Bilateral 2010    Family History  Problem Relation Age of Onset  . Cancer Paternal Aunt   . Cancer Paternal Uncle   . Cancer Paternal Grandfather   . Hypertension Brother   . Diabetes Paternal Grandmother   A paternal aunt had breast cancer age 53, a paternal uncle had prostate cancer, and a paternal grandfather had leukemia.  Social History:  reports that she has never smoked. She has never used smokeless tobacco. She reports that she does  not drink alcohol or use drugs.  She is from [place].  She moved to [place] in 2015.  She moved to [place] in 04/2014.  She recently moved into the [place] area.  She has 2 children (boy and girl).  Her family will likely be moving to [facility] in December.  They are looking for a home.  She is going on a trip to [place] 10/18/2016 - 10/24/2016.  The patient is accompanied by the Spanish interpreter today.  Allergies:  Allergies  Allergen Reactions  . Taxol [Paclitaxel] Anaphylaxis  . Aspirin Swelling  . Penicillins Swelling  . Zofran [Ondansetron Hcl] Nausea And Vomiting    Current Medications: Current Outpatient Prescriptions  Medication Sig Dispense Refill  . CALCIUM-VITAMIN D PO Take 1 tablet by mouth daily.    . Cyanocobalamin (VITAMIN B-12 PO) Take by mouth.    . dexamethasone (DECADRON) 4 MG tablet Take 1 tablet (4 mg total) by mouth daily. 25 tablet 0  . Multiple Vitamins-Minerals (CENTRUM ADULTS PO) Take 1 tablet by mouth daily.    . pantoprazole (PROTONIX) 20 MG tablet Take 1 tablet by mouth daily 30 tablet 3  . promethazine (PHENERGAN) 25 MG tablet Take 1 tablet (25 mg total) by mouth every 6 (six) hours as needed for nausea or vomiting. 30 tablet 2  . capecitabine (XELODA) 150 MG tablet Take 1 tablet (150 mg total) by mouth 2 (two) times daily after a meal. Take with 500 mg prescription (Patient not  taking: Reported on 09/29/2016) 28 tablet 0  . capecitabine (XELODA) 500 MG tablet Take 3 tablets (1,500 mg total) by mouth 2 (two) times daily after a meal. Take with 150 mg prescription (Patient not taking: Reported on 09/29/2016) 84 tablet 0  . docusate sodium (COLACE) 100 MG capsule Take 1 tablet once or twice daily as needed for constipation while taking narcotic pain medicine (Patient not taking: Reported on 09/29/2016) 30 capsule 0  . fentaNYL (DURAGESIC - DOSED MCG/HR) 25 MCG/HR patch Place 1 patch (25 mcg total) onto the skin every 3 (three) days. (Patient not  taking: Reported on 10/30/2016) 10 patch 0  . HYDROcodone-acetaminophen (NORCO/VICODIN) 5-325 MG tablet Take 1-2 tablets by mouth every 4 (four) hours as needed for moderate pain. (Patient not taking: Reported on 09/25/2016) 30 tablet 0  . ibuprofen (ADVIL,MOTRIN) 800 MG tablet Take 800 mg by mouth every 8 (eight) hours as needed (Takes 1/2 tablet prn).    Marland Kitchen omeprazole (PRILOSEC) 20 MG capsule Take 20 mg by mouth daily.  2  . oxycodone (OXY-IR) 5 MG capsule Take 1 capsule (5 mg total) by mouth every 4 (four) hours as needed. (Patient not taking: Reported on 09/25/2016) 30 capsule 0  . oxyCODONE-acetaminophen (PERCOCET/ROXICET) 5-325 MG tablet Take 1 tablet by mouth every 4 (four) hours as needed for severe pain. (Patient not taking: Reported on 09/25/2016) 40 tablet 0  . palbociclib (IBRANCE) 100 MG capsule Take 1 capsule daily for 21 days and then take 1 week off and repeat cycle again (Take whole with food.) (Patient not taking: Reported on 09/25/2016) 21 capsule 1  . potassium chloride SA (K-DUR,KLOR-CON) 20 MEQ tablet Take 1 tablet (20 mEq total) by mouth 2 (two) times daily. for 1 days then 1 pill a day x 2 days. (Patient not taking: Reported on 09/29/2016) 10 tablet 0  . traMADol (ULTRAM) 50 MG tablet Take 50 mg by mouth every 12 (twelve) hours as needed.     No current facility-administered medications for this visit.     Review of Systems:  GENERAL:  Feels "good".  No fevers or sweats.  Weight is down 1 pound. PERFORMANCE STATUS (ECOG):  1 HEENT:  No visual changes, runny nose, sore throat, mouth sores or tenderness.  Excess saliva. Lungs: No shortness of breath.  Cough.  No hemoptysis. Cardiac:  No chest pain, palpitations, orthopnea, or PND. GI:  Nausea, controlled with meds.  No diarrhea, constipation, melena or hematochezia.  GU:  No urgency, frequency, dysuria or hematuria. Musculoskeletal: No pain.  No muscle tenderness. Extremities:  No pain or swelling. Skin:  No rashes or  irritation. Neuro:  No headache, numbness or weakness, balance or coordination issues.  Endocrine:  No diabetes, thyroid issues, or night sweats.  Hot flashes. Psych:  No mood changes, depression or anxiety. Pain:  No pain pills since 10/13/2016 (see HPI). Review of systems:  All other systems reviewed and found to be negative.  Physical Exam: Blood pressure 116/80, pulse 80, temperature 98.3 F (36.8 C), temperature source Tympanic, resp. rate 18, weight 150 lb 9 oz (68.3 kg), last menstrual period 03/19/2015. GENERAL:  Well developed, well nourished, woman sitting comfortably in the exam room in no acute distress. MENTAL STATUS:  Alert and oriented to person, place and time. HEAD: Wearing a brown cap.  Short black hair.  Normocephalic, atraumatic, face symmetric, no Cushingoid features. EYES:  Brown eyes. Pupils equal round and reactive to light and accomodation.  No conjunctivitis or scleral icterus. ENT:  Oropharynx clear without lesion.  Tongue normal. Mucous membranes moist.  RESPIRATORY:  Decreased breath sounds right base (chronic secondary to elevated hemidiaphragm).  Clear to auscultation without rales, wheezes or rhonchi. CARDIOVASCULAR:  Regular rate and rhythm without murmur, rub or gallop. ABDOMEN:  Soft, non-tender, with active bowel sounds, and no hepatosplenomegaly.  No masses. SKIN:  No rashes, bruises or ulcers. EXTREMITIES:  No edema, no skin discoloration or tenderness.  No palpable cords. LYMPH NODES:  No palpable cervical, supraclavicular, axillary or inguinal adenopathy  NEUROLOGICAL:  Appropriate. PSYCH:  Appropriate.      Pathology: 09/03/2007: Right breast core biopsy: infiltrating duct cell carcinoma high grade ER/PR (reportedly positive) Her2 (?) 10/02/2007: Right axillary node core biopsy: Fragment with metastatic carcinoma 10/23/2007: Left breast core biopsy: DCIS high grade ER/PR (?) 07/13/2008: Right mastectomy: Invasive ductal carcinoma Grade III  Nottingham score = tubules 3+ Nuclei 3+ mitoses 2+) 1.7 cm size. No lymph invasion. Tumor cellularity 20%. [id number]. 07/13/2008: Left mastectomy: Residual ductal carcinoma in situ, high nuclear grade, deep margin negative ypTisNO(sn)MX. 10/25/2010: Abdominal and pelvic ultrasound: questionable lesion near gallbladder fossa recommend MRI follow up. 03/02/2011: Left and right frontal excisional brain biopsy: Adenocarcinoma, metastatic, NOS: ER +, PR 5% +, Her 2 NEG. 03/04/2011: [facility] testing of IHC4 residual recurrence risk score of 114 showing an 8 year recurrence reate of 57%. ER POSTIVE, PR POSITIVE, HER 2 POSTIVE (previously documented negative in 2009) cutoff of 361 patient scores 426. ki67 71%.  08/05/2013: Right breast FNA supraclavicular region: positive for metastatic carcinoma. ER/PR/HER2 not reported. 08/19/2014: Right cervical lymph node. Adenocarcinoma with features consistent with metastatic breast carcinoma. ER/PR/HER2 insufficient tissue. 09/04/2014: Repeat craniotomy, resection of right frontal tumor (metastatic breast cancer). ER 90-100% PR 51-60% HER2 - 09/29/2014: Right cervical lymph node, metastatic carcinoma c/w breast primary, ER 100% PR 20% HER2-  Imaging: 08/15/2007: Bilateral Mammogram: Dense breasts with solid lesion at 6:00, 7:00, and 9:00 of right breast, solid lesion at 2:00 position left breast BIRADS 4B. 09/10/2007: Breast MRI: Three solid irregular enhancing lesions of the right breast 2.1 x 3.2, 2.8 x 2.7, and 1.8 x 1.6 cm. Mildly enlarged enhancing nodule right axilla. Left breast clumped enhancement midportion suspicious for malignancy. 03/01/2011: Brain MRI: At least two juxtacortical, intra-axial masses with extensive surrounding vasogenic edema most consistent with intracranial metastasis. 07/22/2013: Brain MRI +/- contrast: interval increase in enhancing component on T2 FLAIR now measuring 1.0cm x 1.1 cm worrisome for progression of disease. 01/21/2014:  PET  scan: Hypermetabolic small right supraclavicular and right upper paratracheal lymph nodes suspicious for mets. Hypermetabolic lymph node in the right paratracheal upper mediastinum that measure approximately 0.9 x 0.5cm. (of note no impressions made of areas noted on brain MRI 07/22/13).  01/21/2014: Head MRI: Right frontal enhancing lesion has shown interval increase in size with surrounding edema and local mass effect (measured 1.8x2.1x1.6, previously 1.2x1.1x1.0) 07/16/2014: Head MRI:  Right anterior frontal enhancing mass 2.5 x 2.0 cm compatible with a metastatic lesion has increased and changed in configuration from previous 2.0 x 1.8 cm, formerly multilobular. 08/06/2014: PET scan:  Multiple bilateral FDG avid low cervical, supraclavicular, upper mediastinal, and right internal mammary lymphadenopathy, likely representing metastatic disease. A cervical node in the right lower neck should be amenable to percutaneous sampling.  12/2014: Head MRI:  Evolution of right frontal postoperative changes status post tumor resection at the vertex. Expected postoperative changes. Minimal marginal enhancement resection bed with some adjacent posterior intrinsic T1 signal again seen. Decreasing T2/FLAIR adjacent parenchymal changes.  Stable resection cavity is left posterior frontal and right parieto-occipital regions. 04/20/2015 : PET scan: Cervical and thoracic nodal metastasis.  There was multifocal osseous metastasis (left transverse T4 process, left humeral head, right iliac wing, and left side of the sacrum).  Incidental findings included right nephrolithiasis.   04/22/2015: Bone scan: Corresponded with PET-CT with metastatic lesions evident in the left humeral head, left posterior aspect of T4, left aspect of S1, and the right iliac crest.  There was no other definite foci of metastatic disease to bone.  Uptake in the skull was most suggestive of the previous right frontal craniotomy. 05/24/2015: Bone density:   T-score of -0.8 in the right femoral neck (normal). 07/07/2015:  Head MRI:  Metastatic breast cancer with 3 resection cavities. The anterior right frontal cavity was positive for nodular marginal enhancement, new from brain MRI report 01/07/2015, and consistent with recurrent disease.  08/12/2015:  Lumbar Spine MRI: Osseous metastatic disease at L3, L4, L5, S1, and in the left iliac bone posteriorly. There was no definite epidural metastatic disease. 08/20/2015:  PET scan:  Response to therapy in the lower cervical and thoracic adenopathy.  There was a mixed response to multifocal osseous metastases (some new lesions).  12/06/2015:  Head MRI:  Decreased nodular contrast enhancement at the right frontal lobe treatment site.  There was unchanged appearance of treatment sites within the bilateral parietal lobe size without residual or recurrent disease.  There were no new metastatic lesions. 02/18/2016: PET scan: Mixed response to therapy. There hadbeen improvement in right cervical lymphadenopathy and in several osseous lesions. There wereseveral other osseous lesions in the pelvis with increasing hypermetabolism compared to the prior study. As the majority of the osseous lesions wereincreasingly sclerotic, some of this hypermetabolism could simply reflect bony healing. There was no new extraskeletal metastatic disease is noted in the neck, chest, abdomen or pelvis. 03/06/2016: Head MRI: Unchanged small focus of nodular enhancement at the right frontal resection site. There was unchanged appearance of the 2 other resection sites without abnormal enhancement. There was no evidence of new intracranial metastases. 05/23/2016:  Bone scan:  Stable focus of abnormal uptake seen in right frontal skull consistent with prior craniotomy.  There were areas of abnormal uptake identified in the left humeral head, T4 and S1 levels and right iliac crest that were present but decreased in intensity compared to prior  exam suggesting improvement.  There was a new foci of abnormal uptake are noted in a right lower rib,T12 and L1 levels of spine concerning for metastatic disease. 06/07/2016:  Thoracic and lumbar spine MRI:  multiple low T1/T2 weighted signal, non enhancing lesions within the thoracic spine, which corresponded in size and location to sclerotic lesions demonstrated on the PET CT of 02/18/2016.  There were no new thoracic lesions.  There was minimal residual contrast enhancement within the L5 vertebral body metastatic lesion, decreased compared to the MRI of 08/12/2015.  There was minimal contrast enhancement at the periphery of the lesion within the L1 vertebral body. This lesion was new compared to MRI of 08/12/2015, but unchanged in size and location compared to PET CT of 02/18/2016.  There was no epidural disease, spinal canal stenosis or neural foraminal encroachment. 06/13/2016:  Head MRI revealed no evidence of residual or recurrent disease. 08/11/2016:  Chest, abdomen, and pelvic CT revealed  new 8 mm right supraclavicular, 12 mm right paratracheal and 10 mm subcarinal lymphadenopathy suspicious for metastatic nodal recurrence.  There was extensive patchy sclerotic osseous metastases throughout  the axial and proximal appendicular skeleton appear stable in size and distribution although generally increased in sclerosis since 02/18/2016 PET-CT, which was a nonspecific change that could reflect treatment effect or progression.  There was no additional sites of metastatic disease in the chest, abdomen or pelvis. 08/11/2016:  Bone scan revealed scattered foci of abnormal osseous tracer accumulation consistent with osseous metastatic disease with new sites of abnormal uptake in the thoracic spine at T5 in the posterior LEFT sixth rib. 08/31/2016:  Chest CT angiogram revealed interval confluent opacity in the posterior aspect of the right upper lobe suspicious for pneumonia. There was a small right-sided pleural  effusion and adjacent right lower lobe atelectasis. 10/27/2016:  Head MRI revealed 9 new small brain metastases since 05/2016. The largest was a 10 mm metastasis in the posterior right cerebellum. The smallest lesions were punctate, and 1 of these along the posterior inferior left cerebellar hemisphere was suspicious for leptomeningeal based disease.  There was no associated edema or mass effect.   Labs:  Appointment on 11/13/2016  Component Date Value Ref Range Status  . WBC 11/13/2016 10.9  3.6 - 11.0 K/uL Final  . RBC 11/13/2016 3.62* 3.80 - 5.20 MIL/uL Final  . Hemoglobin 11/13/2016 10.6* 12.0 - 16.0 g/dL Final  . HCT 11/13/2016 31.7* 35.0 - 47.0 % Final  . MCV 11/13/2016 87.6  80.0 - 100.0 fL Final  . MCH 11/13/2016 29.3  26.0 - 34.0 pg Final  . MCHC 11/13/2016 33.4  32.0 - 36.0 g/dL Final  . RDW 11/13/2016 15.7* 11.5 - 14.5 % Final  . Platelets 11/13/2016 368  150 - 440 K/uL Final  . Neutrophils Relative % 11/13/2016 90  % Final  . Neutro Abs 11/13/2016 9.8* 1.4 - 6.5 K/uL Final  . Lymphocytes Relative 11/13/2016 5  % Final  . Lymphs Abs 11/13/2016 0.6* 1.0 - 3.6 K/uL Final  . Monocytes Relative 11/13/2016 5  % Final  . Monocytes Absolute 11/13/2016 0.5  0.2 - 0.9 K/uL Final  . Eosinophils Relative 11/13/2016 0  % Final  . Eosinophils Absolute 11/13/2016 0.0  0 - 0.7 K/uL Final  . Basophils Relative 11/13/2016 0  % Final  . Basophils Absolute 11/13/2016 0.0  0 - 0.1 K/uL Final  . Sodium 11/13/2016 135  135 - 145 mmol/L Final  . Potassium 11/13/2016 3.9  3.5 - 5.1 mmol/L Final  . Chloride 11/13/2016 100* 101 - 111 mmol/L Final  . CO2 11/13/2016 26  22 - 32 mmol/L Final  . Glucose, Bld 11/13/2016 127* 65 - 99 mg/dL Final  . BUN 11/13/2016 12  6 - 20 mg/dL Final  . Creatinine, Ser 11/13/2016 0.60  0.44 - 1.00 mg/dL Final  . Calcium 11/13/2016 8.9  8.9 - 10.3 mg/dL Final  . GFR calc non Af Amer 11/13/2016 >60  >60 mL/min Final  . GFR calc Af Amer 11/13/2016 >60  >60 mL/min Final    Comment: (NOTE) The eGFR has been calculated using the CKD EPI equation. This calculation has not been validated in all clinical situations. eGFR's persistently <60 mL/min signify possible Chronic Kidney Disease.   . Anion gap 11/13/2016 9  5 - 15 Final    Assessment:  Gunnar Bulla [name] is a 46 y.o. 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  She initially presented in 2009 while living in [place] with multi-focal right breast cancer with positive lymph node(s) which was ER+, PR+(low), and HER2/neu+. She received neoadjuvant chemotherapy (AC x 4  every 3 weeks followed by Taxol/Abraxane + carboplatin weekly x 12) without anti-HER2 treatment. BRCA1/2 testing was negative on 08/15/2016.  On 07/14/2008, she underwent bilateral mastectomies followed by reconstruction.  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   Zero of 11 lymph nodes on the right were positive for malignancy. 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 Deep margin was negative. Zero of 2 lymph nodes were positive for metastatic disease.  She received adjuvant radiation to the right breast.  She received adjuvant tamoxifen and Zoladex.  She could not tolerate symptoms of joint pain and tamoxifen was discontinued after 1-2 months.  Zoladex was continued for approximately 1.5 years.  She was diagnosed with brain metastases in 02/2011.  Pathology revealed ER+, PR+(low), and HER2/neu-.  She underwent resection followed by [facility].  On 03/09/2011, original breast tissue (09/03/2007) sent to [facility] testing.  Testing revealed ER+, PR+(borderline), and HER2/neu positive (different than initial testing).  She received Herceptin for 1 year beginning in 2013.  She then began Tykerb and Xeloda after completion of Herceptin (2014).  She was on extremely low, and probably sub-therapeutic, dosing of lapatinib + capecitabine.   She developed right  supraclavicular adenopathy in 2015. She was noted to have slow disease progression in the right frontal/parietal lesion with minimal presence of systemic disease on PET scans. Capecitabine + lapatinib were discontinued in 04/2014.   She underwent repeat craniotomy with resection of brain metastasis on 09/04/2014.  Biopsy of the right supraclavicular lymph node was ER+,PR+,HER2/neu-.  She started tamoxifen in 09/2014, but discontinued it secondary to pain in hip, back, and shoulder.  She restarted tamoxifen on 04/23/2015.  Tamoxifen was discontinued on 08/23/2015 secondary to progressive disease.  She received Zoladex on 04/30/2015.  She underwent laparoscopic bilateral oophorectomy on 05/18/2015.  She receives monthly Xgeva (began 04/30/2015; last 10/16/2016).  She is post-menopausal.  She began Faslodex on 08/23/2015 (last 04/04/2016).  She received 7 cycles of Ibrance (09/20/2015 - 06/18/2016).  Patient received palliative radiation 30 Gy to the left humerus and lumbar spine from 08/25/2015 - 09/09/2015.  She is not eligible for a clinical trial.  CA27.29 has been followed: 177.8 on 04/16/2015, 276.4 on 05/28/2015, 287.5 on 07/13/2015, 333.8 on 07/26/2015, 412.9 on 08/23/2015, 315.6 on 09/20/2015, 188.8 on 10/18/2015, 151.5 on 11/15/2015, 122.7 on 12/10/2015, 94.6 on 01/10/2016, 80.9 on 02/07/2016, 79 on 03/06/2016, 84.8 on 04/04/2016, 85.2 on 05/02/2016, 91.1 on 05/30/2016, 121 on 06/30/2016, 182.2 on 07/31/2016, 420.5 on 10/09/2016, and 564.6 on 10/30/2016.  Head MRI on 06/13/2016 revealed no evidence of residual or recurrent disease.  Head MRI on 10/27/2016 revealed 9 new small brain metastases since 05/2016. The largest was a 10 mm metastasis in the posterior right cerebellum. The smallest lesions were punctate, and 1 of these along the posterior inferior left cerebellar hemisphere was suspicious for leptomeningeal based disease.  There was no associated edema or mass effect.  She began whole brain  radiation on 11/08/2016.  Chest, abdomen, and pelvic CT on 08/11/2016 revealed  new 8 mm right supraclavicular, 12 mm right paratracheal and 10 mm subcarinal lymphadenopathy suspicious for metastatic nodal recurrence.  There was extensive patchy sclerotic osseous metastases throughout the axial and proximal appendicular skeleton appear stable in size and distribution although generally increased in sclerosis since 02/18/2016 PET-CT, which was a nonspecific change that could reflect treatment effect or progression.  There was no additional sites of metastatic disease in the chest, abdomen or pelvis.  Chest CT  angiogram on 08/31/2016 revealed interval confluent opacity in the posterior aspect of the right upper lobe suspicious for pneumonia. There was a small right-sided pleural effusion and adjacent right lower lobe atelectasis.  She was treated with Levaquin.  Bone scan on 08/11/2016 revealed scattered foci of abnormal osseous tracer accumulation consistent with osseous metastatic disease with new sites of abnormal uptake in the thoracic spine at T5 in the posterior LEFT sixth rib.  Bone density study on 05/24/2015 revealed a T-score of -0.8 in the right femoral neck (normal).  She completed palliative radiation to T12-L1 of 3000 cGy (06/21/2016 - 07/04/2016).  She began palliative radiation to the left hip from 08/17/2016 - 08/30/2016.  She is on a Fentanyl 25 mcg/hr patch.  Pain is well controlled.  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 Tumor was ER positive (> 90%) PR negative and HER-2/neu negative (1+ IHC).    Xeloda is cost prohibitive.  She does not wish to pursue navelbine or gemcitabine.  She is s/p day 8 of cycle #2 Abraxane (10/09/2016 - 10/30/2016).  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  Work-up on 10/25/2015 revealed a B12 of 141 (low).  B12  was 205 on 12/13/2015 with an MMA of 171 (normal) on 01/10/2016.  Ferritin was 43. Iron studies included a saturation of 13% and a TIBC of 354. TSH and folate were normal.  Reticulocyte count was 2.2%.  She declined B12 injections.  She started oral B12 1000 mcg on 11/05/2015.  She takes her B12 sporadically.  B12 level was 205 (low normal) on 12/13/2015.  Diet is good.  She denies any melena or hematochezia.  Symptomatically, she denies any pain.  Exam is stable.  Plan: 1.  Labs today: CBC with diff, BMP. 2.  Xgeva today. 3.  Continue whole brain radiation.  4.  Continue calcium supplements as previously directed.  Coordinate with your antiemetics to control nausea.  5.  Patient encouraged to eat small frequent meals. For gas, patient can use Gaviscon or Mylicon. 6.  RTC on 11/27/2016 for MD assessment, labs (CBC with diff, CMP, CA27.29), and week 1 of cycle #3 Abraxane.    Eliasar Hlavaty C. Mike Gip, MD  11/13/2016, 11:25 AM

## 2016-11-13 NOTE — Progress Notes (Signed)
Patient states Dr. Donella Stade advised her to take Calcium with Vitamin D.  She has started that. States she had a little nausea over the past few days but managed it by eating something.  Offers no other complaints today.

## 2016-11-14 ENCOUNTER — Ambulatory Visit
Admission: RE | Admit: 2016-11-14 | Discharge: 2016-11-14 | Disposition: A | Discharge status: Home / Self Care | Payer: Medicare HMO | Source: Ambulatory Visit | Attending: Radiation Oncology | Admitting: Radiation Oncology

## 2016-11-14 DIAGNOSIS — C7931 Secondary malignant neoplasm of brain: Secondary | ICD-10-CM | POA: Diagnosis not present

## 2016-11-14 DIAGNOSIS — Z510 Encounter for antineoplastic radiation therapy: Secondary | ICD-10-CM | POA: Diagnosis not present

## 2016-11-14 DIAGNOSIS — C7951 Secondary malignant neoplasm of bone: Secondary | ICD-10-CM | POA: Diagnosis not present

## 2016-11-14 DIAGNOSIS — C50911 Malignant neoplasm of unspecified site of right female breast: Secondary | ICD-10-CM | POA: Diagnosis not present

## 2016-11-14 DIAGNOSIS — Z170 Estrogen receptor positive status [ER+]: Secondary | ICD-10-CM | POA: Diagnosis not present

## 2016-11-14 DIAGNOSIS — Z9221 Personal history of antineoplastic chemotherapy: Secondary | ICD-10-CM | POA: Diagnosis not present

## 2016-11-15 ENCOUNTER — Ambulatory Visit
Admission: RE | Admit: 2016-11-15 | Discharge: 2016-11-15 | Disposition: A | Discharge status: Home / Self Care | Payer: Medicare HMO | Source: Ambulatory Visit | Attending: Radiation Oncology | Admitting: Radiation Oncology

## 2016-11-15 DIAGNOSIS — Z510 Encounter for antineoplastic radiation therapy: Secondary | ICD-10-CM | POA: Diagnosis not present

## 2016-11-15 DIAGNOSIS — Z9221 Personal history of antineoplastic chemotherapy: Secondary | ICD-10-CM | POA: Diagnosis not present

## 2016-11-15 DIAGNOSIS — C7951 Secondary malignant neoplasm of bone: Secondary | ICD-10-CM | POA: Diagnosis not present

## 2016-11-15 DIAGNOSIS — C7931 Secondary malignant neoplasm of brain: Secondary | ICD-10-CM | POA: Diagnosis not present

## 2016-11-15 DIAGNOSIS — Z170 Estrogen receptor positive status [ER+]: Secondary | ICD-10-CM | POA: Diagnosis not present

## 2016-11-15 DIAGNOSIS — C50911 Malignant neoplasm of unspecified site of right female breast: Secondary | ICD-10-CM | POA: Diagnosis not present

## 2016-11-16 ENCOUNTER — Ambulatory Visit
Admission: RE | Admit: 2016-11-16 | Discharge: 2016-11-16 | Disposition: A | Discharge status: Home / Self Care | Payer: Medicare HMO | Source: Ambulatory Visit | Attending: Radiation Oncology | Admitting: Radiation Oncology

## 2016-11-16 DIAGNOSIS — C7931 Secondary malignant neoplasm of brain: Secondary | ICD-10-CM | POA: Diagnosis not present

## 2016-11-16 DIAGNOSIS — C7951 Secondary malignant neoplasm of bone: Secondary | ICD-10-CM | POA: Diagnosis not present

## 2016-11-16 DIAGNOSIS — Z510 Encounter for antineoplastic radiation therapy: Secondary | ICD-10-CM | POA: Diagnosis not present

## 2016-11-16 DIAGNOSIS — Z9221 Personal history of antineoplastic chemotherapy: Secondary | ICD-10-CM | POA: Diagnosis not present

## 2016-11-16 DIAGNOSIS — C50911 Malignant neoplasm of unspecified site of right female breast: Secondary | ICD-10-CM | POA: Diagnosis not present

## 2016-11-16 DIAGNOSIS — Z170 Estrogen receptor positive status [ER+]: Secondary | ICD-10-CM | POA: Diagnosis not present

## 2016-11-17 ENCOUNTER — Ambulatory Visit
Admission: RE | Admit: 2016-11-17 | Discharge: 2016-11-17 | Disposition: A | Discharge status: Home / Self Care | Payer: Medicare HMO | Source: Ambulatory Visit | Attending: Radiation Oncology | Admitting: Radiation Oncology

## 2016-11-17 DIAGNOSIS — Z510 Encounter for antineoplastic radiation therapy: Secondary | ICD-10-CM | POA: Diagnosis not present

## 2016-11-17 DIAGNOSIS — C7951 Secondary malignant neoplasm of bone: Secondary | ICD-10-CM | POA: Diagnosis not present

## 2016-11-17 DIAGNOSIS — Z170 Estrogen receptor positive status [ER+]: Secondary | ICD-10-CM | POA: Diagnosis not present

## 2016-11-17 DIAGNOSIS — C50911 Malignant neoplasm of unspecified site of right female breast: Secondary | ICD-10-CM | POA: Diagnosis not present

## 2016-11-17 DIAGNOSIS — Z9221 Personal history of antineoplastic chemotherapy: Secondary | ICD-10-CM | POA: Diagnosis not present

## 2016-11-17 DIAGNOSIS — C7931 Secondary malignant neoplasm of brain: Secondary | ICD-10-CM | POA: Diagnosis not present

## 2016-11-21 ENCOUNTER — Ambulatory Visit
Admission: RE | Admit: 2016-11-21 | Discharge: 2016-11-21 | Disposition: A | Discharge status: Home / Self Care | Payer: Medicare HMO | Source: Ambulatory Visit | Attending: Radiation Oncology | Admitting: Radiation Oncology

## 2016-11-21 DIAGNOSIS — C7951 Secondary malignant neoplasm of bone: Secondary | ICD-10-CM | POA: Diagnosis not present

## 2016-11-21 DIAGNOSIS — C7931 Secondary malignant neoplasm of brain: Secondary | ICD-10-CM | POA: Diagnosis not present

## 2016-11-21 DIAGNOSIS — Z9221 Personal history of antineoplastic chemotherapy: Secondary | ICD-10-CM | POA: Diagnosis not present

## 2016-11-21 DIAGNOSIS — Z170 Estrogen receptor positive status [ER+]: Secondary | ICD-10-CM | POA: Diagnosis not present

## 2016-11-21 DIAGNOSIS — Z510 Encounter for antineoplastic radiation therapy: Secondary | ICD-10-CM | POA: Diagnosis not present

## 2016-11-21 DIAGNOSIS — C50911 Malignant neoplasm of unspecified site of right female breast: Secondary | ICD-10-CM | POA: Diagnosis not present

## 2016-11-22 ENCOUNTER — Ambulatory Visit
Admission: RE | Admit: 2016-11-22 | Discharge: 2016-11-22 | Disposition: A | Discharge status: Home / Self Care | Payer: Medicare HMO | Source: Ambulatory Visit | Attending: Radiation Oncology | Admitting: Radiation Oncology

## 2016-11-22 DIAGNOSIS — C7951 Secondary malignant neoplasm of bone: Secondary | ICD-10-CM | POA: Diagnosis not present

## 2016-11-22 DIAGNOSIS — C50911 Malignant neoplasm of unspecified site of right female breast: Secondary | ICD-10-CM | POA: Diagnosis not present

## 2016-11-22 DIAGNOSIS — Z510 Encounter for antineoplastic radiation therapy: Secondary | ICD-10-CM | POA: Diagnosis not present

## 2016-11-22 DIAGNOSIS — Z170 Estrogen receptor positive status [ER+]: Secondary | ICD-10-CM | POA: Diagnosis not present

## 2016-11-22 DIAGNOSIS — Z9221 Personal history of antineoplastic chemotherapy: Secondary | ICD-10-CM | POA: Diagnosis not present

## 2016-11-22 DIAGNOSIS — C7931 Secondary malignant neoplasm of brain: Secondary | ICD-10-CM | POA: Diagnosis not present

## 2016-11-23 ENCOUNTER — Ambulatory Visit
Admission: RE | Admit: 2016-11-23 | Discharge: 2016-11-23 | Disposition: A | Discharge status: Home / Self Care | Payer: Medicare HMO | Source: Ambulatory Visit | Attending: Radiation Oncology | Admitting: Radiation Oncology

## 2016-11-23 DIAGNOSIS — C7931 Secondary malignant neoplasm of brain: Secondary | ICD-10-CM | POA: Diagnosis not present

## 2016-11-23 DIAGNOSIS — Z510 Encounter for antineoplastic radiation therapy: Secondary | ICD-10-CM | POA: Diagnosis not present

## 2016-11-23 DIAGNOSIS — C7951 Secondary malignant neoplasm of bone: Secondary | ICD-10-CM | POA: Diagnosis not present

## 2016-11-23 DIAGNOSIS — Z9221 Personal history of antineoplastic chemotherapy: Secondary | ICD-10-CM | POA: Diagnosis not present

## 2016-11-23 DIAGNOSIS — C50911 Malignant neoplasm of unspecified site of right female breast: Secondary | ICD-10-CM | POA: Diagnosis not present

## 2016-11-23 DIAGNOSIS — Z170 Estrogen receptor positive status [ER+]: Secondary | ICD-10-CM | POA: Diagnosis not present

## 2016-11-24 ENCOUNTER — Ambulatory Visit
Admission: RE | Admit: 2016-11-24 | Discharge: 2016-11-24 | Disposition: A | Discharge status: Home / Self Care | Payer: Medicare HMO | Source: Ambulatory Visit | Attending: Radiation Oncology | Admitting: Radiation Oncology

## 2016-11-24 DIAGNOSIS — C7951 Secondary malignant neoplasm of bone: Secondary | ICD-10-CM | POA: Diagnosis not present

## 2016-11-24 DIAGNOSIS — Z510 Encounter for antineoplastic radiation therapy: Secondary | ICD-10-CM | POA: Diagnosis not present

## 2016-11-24 DIAGNOSIS — C50911 Malignant neoplasm of unspecified site of right female breast: Secondary | ICD-10-CM | POA: Diagnosis not present

## 2016-11-24 DIAGNOSIS — Z9221 Personal history of antineoplastic chemotherapy: Secondary | ICD-10-CM | POA: Diagnosis not present

## 2016-11-24 DIAGNOSIS — Z170 Estrogen receptor positive status [ER+]: Secondary | ICD-10-CM | POA: Diagnosis not present

## 2016-11-24 DIAGNOSIS — C7931 Secondary malignant neoplasm of brain: Secondary | ICD-10-CM | POA: Diagnosis not present

## 2016-11-26 NOTE — Progress Notes (Signed)
[facility] day:  11/27/16   Chief Complaint: Melinda Tanner is a 46 y.o. 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   HPI:  The patient was last seen in the medical oncology clinic on 11/13/2016.  At that time, she denied any pain.  She had received her 4th fraction of whole brain radiation.  She received Xgeva.  During the interim, she completed radiation on 11/24/2016.  She is feeling "great".   She denies any headache, focal weakness or numbness.  She is feeling "hyper" with the corticosteroids she is on for her radiation treatments.  She denies any pain.  She was using herbal tea at night to help with sleep. Herbal tea caused "burning" when she voided, therefore it was ultimately discontinued. She is scheduled to begin alternating doses of Decadron (one day on and one day off).  She is taking the recommended amount of calcium and vitamin D.  She is not taking oral B12.  She denies fevers, sweats, weight loss, or recent infections.  She denies any numbness or tingling in her fingers/toes.  She denies alcohol use.    Past Medical History:  Diagnosis Date  . Brain cancer ([place]) 2012   Met. from Breast  . Breast cancer ([place]) 2009  . Complication of anesthesia    nausea, "drops in potassium and magnesium"  . Seizures ([place])     Past Surgical History:  Procedure Laterality Date  . BRAIN SURGERY  2012, 2106  . CESAREAN SECTION    . LAPAROSCOPIC BILATERAL SALPINGO OOPHERECTOMY Bilateral 05/18/2015   Procedure: LAPAROSCOPIC BILATERAL SALPINGO OOPHORECTOMY;  Surgeon: Will Bonnet, MD;  Location: [facility] ORS;  Service: Gynecology;  Laterality: Bilateral;  . MASTECTOMY Bilateral 2010    Family History  Problem Relation Age of Onset  . Cancer Paternal Aunt   . Cancer Paternal Uncle   . Cancer Paternal Grandfather   . Hypertension Brother   . Diabetes  Paternal Grandmother   A paternal aunt had breast cancer age 61, a paternal uncle had prostate cancer, and a paternal grandfather had leukemia.  Social History:  reports that she has never smoked. She has never used smokeless tobacco. She reports that she does not drink alcohol or use drugs.  She is from [place].  She moved to [place] in 2015.  She moved to [place] in 04/2014.  She recently moved into the [place] area.  She has 2 children (boy and girl).  Her family will likely be moving to [facility] in December.  They are looking for a home.  She is going on a trip to [place] 10/18/2016 - 10/24/2016.  The patient is accompanied by the Spanish interpreter today.  Allergies:  Allergies  Allergen Reactions  . Taxol [Paclitaxel] Anaphylaxis  . Aspirin Swelling  . Penicillins Swelling  . Zofran [Ondansetron Hcl] Nausea And Vomiting    Current Medications: Current Outpatient Prescriptions  Medication Sig Dispense Refill  . CALCIUM-VITAMIN D PO Take 1 tablet by mouth daily.    . Cyanocobalamin (VITAMIN B-12 PO) Take by mouth.    . dexamethasone (DECADRON) 4 MG tablet Take 1 tablet (4 mg total) by mouth daily. 25 tablet 0  . ibuprofen (ADVIL,MOTRIN) 800 MG tablet Take 800 mg by mouth every 8 (eight) hours as needed (Takes 1/2 tablet prn).    . Multiple Vitamins-Minerals (CENTRUM ADULTS PO) Take 1 tablet by  mouth daily.    Marland Kitchen omeprazole (PRILOSEC) 20 MG capsule Take 20 mg by mouth daily.  2  . pantoprazole (PROTONIX) 20 MG tablet Take 1 tablet by mouth daily 30 tablet 3  . potassium chloride SA (K-DUR,KLOR-CON) 20 MEQ tablet Take 1 tablet (20 mEq total) by mouth 2 (two) times daily. for 1 days then 1 pill a day x 2 days. 10 tablet 0  . promethazine (PHENERGAN) 25 MG tablet Take 1 tablet (25 mg total) by mouth every 6 (six) hours as needed for nausea or vomiting. 30 tablet 2  . traMADol (ULTRAM) 50 MG tablet Take 50 mg by mouth every 12 (twelve) hours as needed.    . capecitabine  (XELODA) 150 MG tablet Take 1 tablet (150 mg total) by mouth 2 (two) times daily after a meal. Take with 500 mg prescription (Patient not taking: Reported on 09/29/2016) 28 tablet 0  . capecitabine (XELODA) 500 MG tablet Take 3 tablets (1,500 mg total) by mouth 2 (two) times daily after a meal. Take with 150 mg prescription (Patient not taking: Reported on 09/29/2016) 84 tablet 0  . docusate sodium (COLACE) 100 MG capsule Take 1 tablet once or twice daily as needed for constipation while taking narcotic pain medicine (Patient not taking: Reported on 09/29/2016) 30 capsule 0  . fentaNYL (DURAGESIC - DOSED MCG/HR) 25 MCG/HR patch Place 1 patch (25 mcg total) onto the skin every 3 (three) days. (Patient not taking: Reported on 10/30/2016) 10 patch 0  . HYDROcodone-acetaminophen (NORCO/VICODIN) 5-325 MG tablet Take 1-2 tablets by mouth every 4 (four) hours as needed for moderate pain. (Patient not taking: Reported on 09/25/2016) 30 tablet 0  . oxycodone (OXY-IR) 5 MG capsule Take 1 capsule (5 mg total) by mouth every 4 (four) hours as needed. (Patient not taking: Reported on 09/25/2016) 30 capsule 0  . oxyCODONE-acetaminophen (PERCOCET/ROXICET) 5-325 MG tablet Take 1 tablet by mouth every 4 (four) hours as needed for severe pain. (Patient not taking: Reported on 09/25/2016) 40 tablet 0  . palbociclib (IBRANCE) 100 MG capsule Take 1 capsule daily for 21 days and then take 1 week off and repeat cycle again (Take whole with food.) (Patient not taking: Reported on 09/25/2016) 21 capsule 1   No current facility-administered medications for this visit.    Facility-Administered Medications Ordered in Other Visits  Medication Dose Route Frequency Provider Last Rate Last Dose  . heparin lock flush 100 unit/mL  500 Units Intravenous Once Lequita Asal, MD        Review of Systems:  GENERAL:  Feels "good".  "Hyper on steroids".  No fevers or sweats.  Weight down 1 pound since last appointment. PERFORMANCE STATUS  (ECOG):  1 HEENT:  No visual changes, runny nose, sore throat, mouth sores or tenderness.  Excess saliva. Lungs: No shortness of breath.  Cough.  No hemoptysis. Cardiac:  No chest pain, palpitations, orthopnea, or PND. GI:  Nausea before she eats breakfast.  No vomiting, diarrhea, constipation, melena or hematochezia.  GU:  No urgency, frequency, dysuria or hematuria. Musculoskeletal: No pain.  No muscle tenderness. Extremities:  Little right hip pain.  No pain or swelling. Skin:  No rashes or irritation. Neuro:  No headache, numbness or weakness, balance or coordination issues.  Endocrine:  No diabetes, thyroid issues, or night sweats.  Hot flashes. Psych:  No mood changes, depression or anxiety. Pain:  No pain pills since 10/13/2016. Review of systems:  All other systems reviewed and found to be negative.  Physical Exam: Blood pressure 110/79, pulse 96, temperature 98.3 F (36.8 C), temperature source Tympanic, resp. rate 18, weight 149 lb 3.2 oz (67.7 kg), last menstrual period 03/19/2015. GENERAL:  Well developed, well nourished, woman sitting comfortably in the exam room in no acute distress. MENTAL STATUS:  Alert and oriented to person, place and time. HEAD: Wearing a black wrap.  Normocephalic, atraumatic, face symmetric, no Cushingoid features. EYES:  Brown eyes. Pupils equal round and reactive to light and accomodation.  No conjunctivitis or scleral icterus. ENT:  Oropharynx clear without lesion.  Tongue normal. Mucous membranes moist.  RESPIRATORY:  Decreased breath sounds right base (chronic secondary to elevated hemidiaphragm).  Clear to auscultation without rales, wheezes or rhonchi. CARDIOVASCULAR:  Regular rate and rhythm without murmur, rub or gallop. ABDOMEN:  Soft, non-tender, with active bowel sounds, and no hepatosplenomegaly.  No masses. SKIN:  No rashes, bruises or ulcers. EXTREMITIES:  No edema, no skin discoloration or tenderness.  No palpable cords. LYMPH NODES:   No palpable cervical, supraclavicular, axillary or inguinal adenopathy  NEUROLOGICAL:  Appropriate. PSYCH:  Appropriate.      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 No lymph invasion. Tumor cellularity 20%. [id number]. 07/13/2008: Left mastectomy: Residual ductal carcinoma in situ, high nuclear grade, deep margin negative ypTisNO(sn)MX. 10/25/2010: Abdominal and pelvic ultrasound: questionable lesion near gallbladder fossa recommend MRI follow up. 03/02/2011: Left and right frontal excisional brain biopsy: Adenocarcinoma, metastatic, NOS: ER +, PR 5% +, Her 2 NEG. 03/04/2011: [facility] testing of IHC4 residual recurrence risk score of 114 showing an 8 year recurrence reate of 57%. ER POSTIVE, PR POSITIVE, HER 2 POSTIVE (previously documented negative in 2009) cutoff of 361 patient scores 426. ki67 71%.  08/05/2013: Right breast FNA supraclavicular region: positive for metastatic carcinoma. ER/PR/HER2 not reported. 08/19/2014: Right cervical lymph node. Adenocarcinoma with features consistent with metastatic breast carcinoma. ER/PR/HER2 insufficient tissue. 09/04/2014: Repeat craniotomy, resection of right frontal tumor (metastatic breast cancer). ER 90-100% PR 51-60% HER2 - 09/29/2014: Right cervical lymph node, metastatic carcinoma c/w breast primary, ER 100% PR 20% HER2-  Imaging: 08/15/2007: Bilateral Mammogram: Dense breasts with solid lesion at 6:00, 7:00, and 9:00 of right breast, solid lesion at 2:00 position left breast BIRADS 4B. 09/10/2007: Breast MRI: Three solid irregular enhancing lesions of the right breast 2.1 x 3.2, 2.8 x  2.7, and 1.8 x 1.6 cm. Mildly enlarged enhancing nodule right axilla. Left breast clumped enhancement midportion suspicious for malignancy. 03/01/2011: Brain MRI: At least two juxtacortical, intra-axial masses with extensive surrounding vasogenic edema most consistent with intracranial metastasis. 07/22/2013: Brain MRI +/- contrast: interval increase in enhancing component on T2 FLAIR now measuring 1.0cm x 1.1 cm worrisome for progression of disease. 01/21/2014:  PET scan: Hypermetabolic small right supraclavicular and right upper paratracheal lymph nodes suspicious for mets. Hypermetabolic lymph node in the right paratracheal upper mediastinum that measure approximately 0.9 x 0.5cm. (of note no impressions made of areas noted on brain MRI 07/22/13).  01/21/2014: Head MRI: Right frontal enhancing lesion has shown interval increase in size with surrounding edema and local mass effect (measured 1.8x2.1x1.6, previously 1.2x1.1x1.0) 07/16/2014: Head MRI:  Right anterior frontal enhancing mass 2.5 x 2.0 cm compatible with a metastatic lesion  has increased and changed in configuration from previous 2.0 x 1.8 cm, formerly multilobular. 08/06/2014: PET scan:  Multiple bilateral FDG avid low cervical, supraclavicular, upper mediastinal, and right internal mammary lymphadenopathy, likely representing metastatic disease. A cervical node in the right lower neck should be amenable to percutaneous sampling.  12/2014: Head MRI:  Evolution of right frontal postoperative changes status post tumor resection at the vertex. Expected postoperative changes. Minimal marginal enhancement resection bed with some adjacent posterior intrinsic T1 signal again seen. Decreasing T2/FLAIR adjacent parenchymal changes.  Stable resection cavity is left posterior frontal and right parieto-occipital regions. 04/20/2015 : PET scan: Cervical and thoracic nodal metastasis.  There was multifocal osseous metastasis (left transverse T4 process, left  humeral head, right iliac wing, and left side of the sacrum).  Incidental findings included right nephrolithiasis.   04/22/2015: Bone scan: Corresponded with PET-CT with metastatic lesions evident in the left humeral head, left posterior aspect of T4, left aspect of S1, and the right iliac crest.  There was no other definite foci of metastatic disease to bone.  Uptake in the skull was most suggestive of the previous right frontal craniotomy. 05/24/2015: Bone density:  T-score of -0.8 in the right femoral neck (normal). 07/07/2015:  Head MRI:  Metastatic breast cancer with 3 resection cavities. The anterior right frontal cavity was positive for nodular marginal enhancement, new from brain MRI report 01/07/2015, and consistent with recurrent disease.  08/12/2015:  Lumbar Spine MRI: Osseous metastatic disease at L3, L4, L5, S1, and in the left iliac bone posteriorly. There was no definite epidural metastatic disease. 08/20/2015:  PET scan:  Response to therapy in the lower cervical and thoracic adenopathy.  There was a mixed response to multifocal osseous metastases (some new lesions).  12/06/2015:  Head MRI:  Decreased nodular contrast enhancement at the right frontal lobe treatment site.  There was unchanged appearance of treatment sites within the bilateral parietal lobe size without residual or recurrent disease.  There were no new metastatic lesions. 02/18/2016: PET scan: Mixed response to therapy. There hadbeen improvement in right cervical lymphadenopathy and in several osseous lesions. There wereseveral other osseous lesions in the pelvis with increasing hypermetabolism compared to the prior study. As the majority of the osseous lesions wereincreasingly sclerotic, some of this hypermetabolism could simply reflect bony healing. There was no new extraskeletal metastatic disease is noted in the neck, chest, abdomen or pelvis. 03/06/2016: Head MRI: Unchanged small focus of nodular enhancement at the  right frontal resection site. There was unchanged appearance of the 2 other resection sites without abnormal enhancement. There was no evidence of new intracranial metastases. 05/23/2016:  Bone scan:  Stable focus of abnormal uptake seen in right frontal skull consistent with prior craniotomy.  There were areas of abnormal uptake identified in the left humeral head, T4 and S1 levels and right iliac crest that were present but decreased in intensity compared to prior exam suggesting improvement.  There was a new foci of abnormal uptake are noted in a right lower rib,T12 and L1 levels of spine concerning for metastatic disease. 06/07/2016:  Thoracic and lumbar spine MRI:  multiple low T1/T2 weighted signal, non enhancing lesions within the thoracic spine, which corresponded in size and location to sclerotic lesions demonstrated on the PET CT of 02/18/2016.  There were no new thoracic lesions.  There was minimal residual contrast enhancement within the L5 vertebral body metastatic lesion, decreased compared to the MRI of 08/12/2015.  There was minimal contrast enhancement at the periphery  of the lesion within the L1 vertebral body. This lesion was new compared to MRI of 08/12/2015, but unchanged in size and location compared to PET CT of 02/18/2016.  There was no epidural disease, spinal canal stenosis or neural foraminal encroachment. 06/13/2016:  Head MRI revealed no evidence of residual or recurrent disease. 08/11/2016:  Chest, abdomen, and pelvic CT revealed  new 8 mm right supraclavicular, 12 mm right paratracheal and 10 mm subcarinal lymphadenopathy suspicious for metastatic nodal recurrence.  There was extensive patchy sclerotic osseous metastases throughout the axial and proximal appendicular skeleton appear stable in size and distribution although generally increased in sclerosis since 02/18/2016 PET-CT, which was a nonspecific change that could reflect treatment effect or progression.  There was no  additional sites of metastatic disease in the chest, abdomen or pelvis. 08/11/2016:  Bone scan revealed scattered foci of abnormal osseous tracer accumulation consistent with osseous metastatic disease with new sites of abnormal uptake in the thoracic spine at T5 in the posterior LEFT sixth rib. 08/31/2016:  Chest CT angiogram revealed interval confluent opacity in the posterior aspect of the right upper lobe suspicious for pneumonia. There was a small right-sided pleural effusion and adjacent right lower lobe atelectasis. 10/27/2016:  Head MRI revealed 9 new small brain metastases since 05/2016. The largest was a 10 mm metastasis in the posterior right cerebellum. The smallest lesions were punctate, and 1 of these along the posterior inferior left cerebellar hemisphere was suspicious for leptomeningeal based disease.  There was no associated edema or mass effect.   Labs:  Infusion on 11/27/2016  Component Date Value Ref Range Status  . Sodium 11/27/2016 135  135 - 145 mmol/L Final  . Potassium 11/27/2016 3.6  3.5 - 5.1 mmol/L Final  . Chloride 11/27/2016 101  101 - 111 mmol/L Final  . CO2 11/27/2016 27  22 - 32 mmol/L Final  . Glucose, Bld 11/27/2016 90  65 - 99 mg/dL Final  . BUN 11/27/2016 24* 6 - 20 mg/dL Final  . Creatinine, Ser 11/27/2016 0.62  0.44 - 1.00 mg/dL Final  . Calcium 11/27/2016 9.1  8.9 - 10.3 mg/dL Final  . Total Protein 11/27/2016 6.7  6.5 - 8.1 g/dL Final  . Albumin 11/27/2016 3.4* 3.5 - 5.0 g/dL Final  . AST 11/27/2016 64* 15 - 41 U/L Final  . ALT 11/27/2016 100* 14 - 54 U/L Final  . Alkaline Phosphatase 11/27/2016 140* 38 - 126 U/L Final  . Total Bilirubin 11/27/2016 0.9  0.3 - 1.2 mg/dL Final  . GFR calc non Af Amer 11/27/2016 >60  >60 mL/min Final  . GFR calc Af Amer 11/27/2016 >60  >60 mL/min Final   Comment: (NOTE) The eGFR has been calculated using the CKD EPI equation. This calculation has not been validated in all clinical situations. eGFR's persistently <60  mL/min signify possible Chronic Kidney Disease.   . Anion gap 11/27/2016 7  5 - 15 Final  . WBC 11/27/2016 12.8* 3.6 - 11.0 K/uL Final  . RBC 11/27/2016 3.84  3.80 - 5.20 MIL/uL Final  . Hemoglobin 11/27/2016 11.3* 12.0 - 16.0 g/dL Final  . HCT 11/27/2016 33.5* 35.0 - 47.0 % Final  . MCV 11/27/2016 87.2  80.0 - 100.0 fL Final  . MCH 11/27/2016 29.4  26.0 - 34.0 pg Final  . MCHC 11/27/2016 33.7  32.0 - 36.0 g/dL Final  . RDW 11/27/2016 17.2* 11.5 - 14.5 % Final  . Platelets 11/27/2016 262  150 - 440 K/uL Final  . Neutrophils  Relative % 11/27/2016 85  % Final  . Neutro Abs 11/27/2016 10.9* 1.4 - 6.5 K/uL Final  . Lymphocytes Relative 11/27/2016 6  % Final  . Lymphs Abs 11/27/2016 0.7* 1.0 - 3.6 K/uL Final  . Monocytes Relative 11/27/2016 8  % Final  . Monocytes Absolute 11/27/2016 1.0* 0.2 - 0.9 K/uL Final  . Eosinophils Relative 11/27/2016 1  % Final  . Eosinophils Absolute 11/27/2016 0.1  0 - 0.7 K/uL Final  . Basophils Relative 11/27/2016 0  % Final  . Basophils Absolute 11/27/2016 0.0  0 - 0.1 K/uL Final  . Magnesium 11/27/2016 2.0  1.7 - 2.4 mg/dL Final    Assessment:  Melinda Tanner is a 46 y.o. 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  She initially presented in 2009 while living in [place] with multi-focal right breast cancer with positive lymph node(s) which was ER+, PR+(low), and HER2/neu+. She received neoadjuvant chemotherapy (AC x 4 every 3 weeks followed by Taxol/Abraxane + carboplatin weekly x 12) without anti-HER2 treatment. BRCA1/2 testing was negative on 08/15/2016.  On 07/14/2008, she underwent bilateral mastectomies followed by reconstruction.  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   Zero of 11 lymph nodes on the right were positive for malignancy. 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 Deep margin was negative. Zero of 2 lymph nodes were positive for metastatic disease.  She  received adjuvant radiation to the right breast.  She received adjuvant tamoxifen and Zoladex.  She could not tolerate symptoms of joint pain and tamoxifen was discontinued after 1-2 months.  Zoladex was continued for approximately 1.5 years.  She was diagnosed with brain metastases in 02/2011.  Pathology revealed ER+, PR+(low), and HER2/neu-.  She underwent resection followed by [facility].  On 03/09/2011, original breast tissue (09/03/2007) sent to [facility] testing.  Testing revealed ER+, PR+(borderline), and HER2/neu positive (different than initial testing).  She received Herceptin for 1 year beginning in 2013.  She then began Tykerb and Xeloda after completion of Herceptin (2014).  She was on extremely low, and probably sub-therapeutic, dosing of lapatinib + capecitabine.   She developed right supraclavicular adenopathy in 2015. She was noted to have slow disease progression in the right frontal/parietal lesion with minimal presence of systemic disease on PET scans. Capecitabine + lapatinib were discontinued in 04/2014.   She underwent repeat craniotomy with resection of brain metastasis on 09/04/2014.  Biopsy of the right supraclavicular lymph node was ER+,PR+,HER2/neu-.  She started tamoxifen in 09/2014, but discontinued it secondary to pain in hip, back, and shoulder.  She restarted tamoxifen on 04/23/2015.  Tamoxifen was discontinued on 08/23/2015 secondary to progressive disease.  She received Zoladex on 04/30/2015.  She underwent laparoscopic bilateral oophorectomy on 05/18/2015.  She receives monthly Xgeva (began 04/30/2015; last 11/13/2016).  She is post-menopausal.  She began Faslodex on 08/23/2015 (last 04/04/2016).  She received 7 cycles of Ibrance (09/20/2015 - 06/18/2016).  Patient received palliative radiation 30 Gy to the left humerus and lumbar spine from 08/25/2015 - 09/09/2015.  She is not eligible for a clinical trial.  CA27.29 has been followed: 177.8 on 04/16/2015,  276.4 on 05/28/2015, 287.5 on 07/13/2015, 333.8 on 07/26/2015, 412.9 on 08/23/2015, 315.6 on 09/20/2015, 188.8 on 10/18/2015, 151.5 on 11/15/2015, 122.7 on 12/10/2015, 94.6 on 01/10/2016, 80.9 on 02/07/2016, 79 on 03/06/2016, 84.8 on 04/04/2016, 85.2 on 05/02/2016, 91.1 on 05/30/2016, 121 on 06/30/2016, 182.2 on 07/31/2016, 420.5 on 10/09/2016, and 564.6  on 10/30/2016.  Head MRI on 06/13/2016 revealed no evidence of residual or recurrent disease.  Head MRI on 10/27/2016 revealed 9 new small brain metastases since 05/2016. The largest was a 10 mm metastasis in the posterior right cerebellum. The smallest lesions were punctate, and 1 of these along the posterior inferior left cerebellar hemisphere was suspicious for leptomeningeal based disease.  There was no associated edema or mass effect.  She completed whole brain radiation (11/08/2016 - 11/24/2016).  She is on a steroid taper.  Chest, abdomen, and pelvic CT on 08/11/2016 revealed a new 8 mm right supraclavicular, 12 mm right paratracheal and 10 mm subcarinal lymphadenopathy suspicious for metastatic nodal recurrence.  There was extensive patchy sclerotic osseous metastases throughout the axial and proximal appendicular skeleton appear stable in size and distribution although generally increased in sclerosis since 02/18/2016 PET-CT, which was a nonspecific change that could reflect treatment effect or progression.  There was no additional sites of metastatic disease in the chest, abdomen or pelvis.  Chest CT angiogram on 08/31/2016 revealed interval confluent opacity in the posterior aspect of the right upper lobe suspicious for pneumonia. There was a small right-sided pleural effusion and adjacent right lower lobe atelectasis.  She was treated with Levaquin.  Bone scan on 08/11/2016 revealed scattered foci of abnormal osseous tracer accumulation consistent with osseous metastatic disease with new sites of abnormal uptake in the thoracic spine at T5 in  the posterior LEFT sixth rib.  Bone density study on 05/24/2015 revealed a T-score of -0.8 in the right femoral neck (normal).  She completed palliative radiation to T12-L1 of 3000 cGy (06/21/2016 - 07/04/2016).  She received palliative radiation to the left hip from 08/17/2016 - 08/30/2016.  She is on a Fentanyl 25 mcg/hr patch.  Pain is well controlled.  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 Tumor was ER positive (> 90%) PR negative and HER-2/neu negative (1+ IHC).    Xeloda is cost prohibitive.  She does not wish to pursue navelbine or gemcitabine.  She is s/p day 8 of cycle #2 Abraxane (10/09/2016 - 10/30/2016).  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  Work-up on 10/25/2015 revealed a B12 of 141 (low).  B12 was 205 on 12/13/2015 with an MMA of 171 (normal) on 01/10/2016.  Ferritin was 43. Iron studies included a saturation of 13% and a TIBC of 354. TSH and folate were normal.  Reticulocyte count was 2.2%.  She declined B12 injections.  She started oral B12 1000 mcg on 11/05/2015.  She takes her B12 sporadically.  B12 level was 205 (low normal) on 12/13/2015.  Diet is good.  She denies any melena or hematochezia.  Symptomatically, she denies any physical complaints today. LFTs are increased (AST 64, ALT 100, and alkaline phosphatase 140). Exam is stable.  Plan: 1.  Labs today: CBC with diff, CMP, Mg, CA27.29. 2.  Additional labs secondary to elevated LFT:  hepatitis B core antibody, hepatitis B surface anticbody, hepatitis C antibody. 3.  Hold chemotherapy today. 4.  Continue calcium supplements as previously directed.  Coordinate with your antiemetics to control nausea.  5.  Review last imaging studies (08/11/2016) and new increased LFTs.  Discuss restaging prior to reinitiation of chemotherapy.   6.  Schedule chest, abdomen, and pelvis CT on  11/28/2016. 7.  Discuss routine dental cleaning (patient questioned).  Will need pre-treatment with antibiotics if oral bleeding anticipated as she has a port--a-cath.  Patient to discuss with her dentist.  8.  Discuss influenza and pneumonia vaccinations. Patient ok to receive given her adequate blood counts.  She is NOT to receive any live virus vaccines. 9.  RTC on 11/30/2016 for MD assessment, labs (CBC with diff, CMP), review of imaging studies, and week 1 of cycle # 3 Abraxane   Melissa C. Mike Gip, MD  11/27/2016, 10:37 AM

## 2016-11-27 ENCOUNTER — Inpatient Hospital Stay: Payer: Medicare HMO | Attending: Hematology and Oncology

## 2016-11-27 ENCOUNTER — Inpatient Hospital Stay (HOSPITAL_BASED_OUTPATIENT_CLINIC_OR_DEPARTMENT_OTHER): Payer: Medicare HMO | Admitting: Hematology and Oncology

## 2016-11-27 ENCOUNTER — Inpatient Hospital Stay: Payer: Medicare HMO

## 2016-11-27 ENCOUNTER — Other Ambulatory Visit: Payer: Self-pay | Admitting: *Deleted

## 2016-11-27 ENCOUNTER — Encounter: Payer: Self-pay | Admitting: Hematology and Oncology

## 2016-11-27 VITALS — BP 110/79 | HR 96 | Temp 98.3°F | Resp 18 | Wt 149.2 lb

## 2016-11-27 DIAGNOSIS — C50811 Malignant neoplasm of overlapping sites of right female breast: Secondary | ICD-10-CM | POA: Insufficient documentation

## 2016-11-27 DIAGNOSIS — C787 Secondary malignant neoplasm of liver and intrahepatic bile duct: Secondary | ICD-10-CM | POA: Diagnosis not present

## 2016-11-27 DIAGNOSIS — Z923 Personal history of irradiation: Secondary | ICD-10-CM | POA: Diagnosis not present

## 2016-11-27 DIAGNOSIS — R7989 Other specified abnormal findings of blood chemistry: Secondary | ICD-10-CM

## 2016-11-27 DIAGNOSIS — E538 Deficiency of other specified B group vitamins: Secondary | ICD-10-CM

## 2016-11-27 DIAGNOSIS — Z7952 Long term (current) use of systemic steroids: Secondary | ICD-10-CM | POA: Diagnosis not present

## 2016-11-27 DIAGNOSIS — Z5111 Encounter for antineoplastic chemotherapy: Secondary | ICD-10-CM | POA: Diagnosis not present

## 2016-11-27 DIAGNOSIS — C50911 Malignant neoplasm of unspecified site of right female breast: Secondary | ICD-10-CM | POA: Diagnosis not present

## 2016-11-27 DIAGNOSIS — C770 Secondary and unspecified malignant neoplasm of lymph nodes of head, face and neck: Secondary | ICD-10-CM

## 2016-11-27 DIAGNOSIS — Z806 Family history of leukemia: Secondary | ICD-10-CM

## 2016-11-27 DIAGNOSIS — C7951 Secondary malignant neoplasm of bone: Secondary | ICD-10-CM | POA: Insufficient documentation

## 2016-11-27 DIAGNOSIS — Z880 Allergy status to penicillin: Secondary | ICD-10-CM | POA: Diagnosis not present

## 2016-11-27 DIAGNOSIS — D649 Anemia, unspecified: Secondary | ICD-10-CM | POA: Diagnosis not present

## 2016-11-27 DIAGNOSIS — K869 Disease of pancreas, unspecified: Secondary | ICD-10-CM | POA: Insufficient documentation

## 2016-11-27 DIAGNOSIS — Z780 Asymptomatic menopausal state: Secondary | ICD-10-CM

## 2016-11-27 DIAGNOSIS — Z170 Estrogen receptor positive status [ER+]: Secondary | ICD-10-CM

## 2016-11-27 DIAGNOSIS — Z853 Personal history of malignant neoplasm of breast: Secondary | ICD-10-CM

## 2016-11-27 DIAGNOSIS — C7931 Secondary malignant neoplasm of brain: Secondary | ICD-10-CM

## 2016-11-27 DIAGNOSIS — Z7189 Other specified counseling: Secondary | ICD-10-CM

## 2016-11-27 DIAGNOSIS — Z803 Family history of malignant neoplasm of breast: Secondary | ICD-10-CM | POA: Insufficient documentation

## 2016-11-27 DIAGNOSIS — R5381 Other malaise: Secondary | ICD-10-CM | POA: Insufficient documentation

## 2016-11-27 DIAGNOSIS — M25551 Pain in right hip: Secondary | ICD-10-CM

## 2016-11-27 DIAGNOSIS — Z9221 Personal history of antineoplastic chemotherapy: Secondary | ICD-10-CM | POA: Diagnosis not present

## 2016-11-27 DIAGNOSIS — Z79899 Other long term (current) drug therapy: Secondary | ICD-10-CM

## 2016-11-27 DIAGNOSIS — C50919 Malignant neoplasm of unspecified site of unspecified female breast: Secondary | ICD-10-CM

## 2016-11-27 DIAGNOSIS — Z8042 Family history of malignant neoplasm of prostate: Secondary | ICD-10-CM

## 2016-11-27 DIAGNOSIS — R945 Abnormal results of liver function studies: Secondary | ICD-10-CM

## 2016-11-27 DIAGNOSIS — R110 Nausea: Secondary | ICD-10-CM | POA: Insufficient documentation

## 2016-11-27 DIAGNOSIS — Z9013 Acquired absence of bilateral breasts and nipples: Secondary | ICD-10-CM

## 2016-11-27 DIAGNOSIS — C7970 Secondary malignant neoplasm of unspecified adrenal gland: Secondary | ICD-10-CM | POA: Insufficient documentation

## 2016-11-27 DIAGNOSIS — D0512 Intraductal carcinoma in situ of left breast: Secondary | ICD-10-CM | POA: Insufficient documentation

## 2016-11-27 LAB — CBC WITH DIFFERENTIAL/PLATELET
Basophils Absolute: 0 10*3/uL (ref 0–0.1)
Basophils Relative: 0 %
Eosinophils Absolute: 0.1 10*3/uL (ref 0–0.7)
Eosinophils Relative: 1 %
HCT: 33.5 % — ABNORMAL LOW (ref 35.0–47.0)
Hemoglobin: 11.3 g/dL — ABNORMAL LOW (ref 12.0–16.0)
Lymphocytes Relative: 6 %
Lymphs Abs: 0.7 10*3/uL — ABNORMAL LOW (ref 1.0–3.6)
MCH: 29.4 pg (ref 26.0–34.0)
MCHC: 33.7 g/dL (ref 32.0–36.0)
MCV: 87.2 fL (ref 80.0–100.0)
Monocytes Absolute: 1 10*3/uL — ABNORMAL HIGH (ref 0.2–0.9)
Monocytes Relative: 8 %
Neutro Abs: 10.9 10*3/uL — ABNORMAL HIGH (ref 1.4–6.5)
Neutrophils Relative %: 85 %
Platelets: 262 10*3/uL (ref 150–440)
RBC: 3.84 MIL/uL (ref 3.80–5.20)
RDW: 17.2 % — ABNORMAL HIGH (ref 11.5–14.5)
WBC: 12.8 10*3/uL — ABNORMAL HIGH (ref 3.6–11.0)

## 2016-11-27 LAB — COMPREHENSIVE METABOLIC PANEL
ALT: 100 U/L — ABNORMAL HIGH (ref 14–54)
AST: 64 U/L — ABNORMAL HIGH (ref 15–41)
Albumin: 3.4 g/dL — ABNORMAL LOW (ref 3.5–5.0)
Alkaline Phosphatase: 140 U/L — ABNORMAL HIGH (ref 38–126)
Anion gap: 7 (ref 5–15)
BUN: 24 mg/dL — ABNORMAL HIGH (ref 6–20)
CO2: 27 mmol/L (ref 22–32)
Calcium: 9.1 mg/dL (ref 8.9–10.3)
Chloride: 101 mmol/L (ref 101–111)
Creatinine, Ser: 0.62 mg/dL (ref 0.44–1.00)
GFR calc Af Amer: >60 mL/min (ref >60)
GFR calc non Af Amer: >60 mL/min (ref >60)
Glucose, Bld: 90 mg/dL (ref 65–99)
Potassium: 3.6 mmol/L (ref 3.5–5.1)
Sodium: 135 mmol/L (ref 135–145)
Total Bilirubin: 0.9 mg/dL (ref 0.3–1.2)
Total Protein: 6.7 g/dL (ref 6.5–8.1)

## 2016-11-27 LAB — MAGNESIUM: Magnesium: 2 mg/dL (ref 1.7–2.4)

## 2016-11-27 MED ORDER — SODIUM CHLORIDE 0.9% FLUSH
10.0000 mL | Freq: Once | INTRAVENOUS | Status: AC
  Administered 2016-11-27: 10 mL via INTRAVENOUS
  Filled 2016-11-27: qty 10

## 2016-11-27 MED ORDER — HEPARIN SOD (PORK) LOCK FLUSH 100 UNIT/ML IV SOLN
500.0000 [IU] | Freq: Once | INTRAVENOUS | Status: AC
  Administered 2016-11-27: 500 [IU] via INTRAVENOUS
  Filled 2016-11-27: qty 5

## 2016-11-27 NOTE — Progress Notes (Signed)
Patient is having new right leg weakness that will usually last about 4 hours.  She explains the right leg sensation more as bone weakness that muscular with no pain and started Saturday.    Patient would like to know if/when she could get a pneumonia vaccine.    When could she get a dental cleaning.

## 2016-11-28 LAB — CANCER ANTIGEN 27.29: CA 27.29: 1322.6 U/mL — ABNORMAL HIGH (ref 0.0–38.6)

## 2016-11-29 ENCOUNTER — Other Ambulatory Visit: Payer: Self-pay | Admitting: *Deleted

## 2016-11-29 ENCOUNTER — Ambulatory Visit
Admission: RE | Admit: 2016-11-29 | Discharge: 2016-11-29 | Disposition: A | Discharge status: Home / Self Care | Payer: Medicare HMO | Source: Ambulatory Visit | Attending: Urgent Care | Admitting: Urgent Care

## 2016-11-29 ENCOUNTER — Inpatient Hospital Stay: Payer: Medicare HMO

## 2016-11-29 DIAGNOSIS — C7951 Secondary malignant neoplasm of bone: Secondary | ICD-10-CM | POA: Insufficient documentation

## 2016-11-29 DIAGNOSIS — Z170 Estrogen receptor positive status [ER+]: Secondary | ICD-10-CM | POA: Diagnosis not present

## 2016-11-29 DIAGNOSIS — Z5111 Encounter for antineoplastic chemotherapy: Secondary | ICD-10-CM | POA: Diagnosis not present

## 2016-11-29 DIAGNOSIS — C787 Secondary malignant neoplasm of liver and intrahepatic bile duct: Secondary | ICD-10-CM | POA: Diagnosis not present

## 2016-11-29 DIAGNOSIS — Z9013 Acquired absence of bilateral breasts and nipples: Secondary | ICD-10-CM | POA: Insufficient documentation

## 2016-11-29 DIAGNOSIS — R945 Abnormal results of liver function studies: Secondary | ICD-10-CM

## 2016-11-29 DIAGNOSIS — R918 Other nonspecific abnormal finding of lung field: Secondary | ICD-10-CM | POA: Diagnosis not present

## 2016-11-29 DIAGNOSIS — C50919 Malignant neoplasm of unspecified site of unspecified female breast: Secondary | ICD-10-CM | POA: Insufficient documentation

## 2016-11-29 DIAGNOSIS — Z923 Personal history of irradiation: Secondary | ICD-10-CM | POA: Diagnosis not present

## 2016-11-29 DIAGNOSIS — R079 Chest pain, unspecified: Secondary | ICD-10-CM | POA: Diagnosis not present

## 2016-11-29 DIAGNOSIS — C50811 Malignant neoplasm of overlapping sites of right female breast: Secondary | ICD-10-CM | POA: Diagnosis not present

## 2016-11-29 DIAGNOSIS — R7989 Other specified abnormal findings of blood chemistry: Secondary | ICD-10-CM

## 2016-11-29 DIAGNOSIS — C7970 Secondary malignant neoplasm of unspecified adrenal gland: Secondary | ICD-10-CM | POA: Diagnosis not present

## 2016-11-29 DIAGNOSIS — C770 Secondary and unspecified malignant neoplasm of lymph nodes of head, face and neck: Secondary | ICD-10-CM | POA: Diagnosis not present

## 2016-11-29 DIAGNOSIS — Z95828 Presence of other vascular implants and grafts: Secondary | ICD-10-CM

## 2016-11-29 DIAGNOSIS — D0512 Intraductal carcinoma in situ of left breast: Secondary | ICD-10-CM | POA: Diagnosis not present

## 2016-11-29 DIAGNOSIS — C7931 Secondary malignant neoplasm of brain: Secondary | ICD-10-CM | POA: Diagnosis not present

## 2016-11-29 MED ORDER — SODIUM CHLORIDE 0.9% FLUSH
10.0000 mL | INTRAVENOUS | Status: DC | PRN
  Administered 2016-11-29: 10 mL via INTRAVENOUS
  Filled 2016-11-29: qty 10

## 2016-11-29 MED ORDER — IOPAMIDOL (ISOVUE-300) INJECTION 61%
100.0000 mL | Freq: Once | INTRAVENOUS | Status: AC | PRN
  Administered 2016-11-29: 100 mL via INTRAVENOUS

## 2016-11-29 MED ORDER — HEPARIN SOD (PORK) LOCK FLUSH 100 UNIT/ML IV SOLN
500.0000 [IU] | Freq: Once | INTRAVENOUS | Status: AC
  Administered 2016-11-29: 500 [IU] via INTRAVENOUS

## 2016-11-30 ENCOUNTER — Inpatient Hospital Stay: Payer: Medicare HMO | Admitting: Hematology and Oncology

## 2016-11-30 NOTE — Progress Notes (Unsigned)
[facility] day:  11/30/16   Chief Complaint: Melinda Tanner is a 46 y.o. female with metastatic breast cancer with brain metastasis who is seen for review of interval CT scans and discussion regarding direction of therapy.   HPI:  The patient was last seen in the medical oncology clinic on 11/27/2016.  At that time, she had completed cranial radiation on 11/24/2016.  She denied pain.  Liver function tests were elevated (new).  Decision was made to proceed with restaging prior to reinitiation of chemotherapy.  Chest, abdomen, and pelvic CT on 11/29/2016 revealed multiple hepatic metastasis, a 10 mm adrenal metastasis, multiple abdominal nodes, 4.2 x 2.1 cm pancreatic head mass and a separate 1.1 cm lesion in the anterior pancreatic body, new 9 mm paraesophageal lymph node, trace pericardial thickening/fluid, 2 focal lung nodules (10 mm and 5 mm) in the RUL, and an enlarging blastic lesion of T5.   Past Medical History:  Diagnosis Date  . Brain cancer ([place]) 2012   Met. from Breast  . Breast cancer ([place]) 2009  . Complication of anesthesia    nausea, "drops in potassium and magnesium"  . Seizures ([place])     Past Surgical History:  Procedure Laterality Date  . BRAIN SURGERY  2012, 2106  . CESAREAN SECTION    . LAPAROSCOPIC BILATERAL SALPINGO OOPHERECTOMY Bilateral 05/18/2015   Procedure: LAPAROSCOPIC BILATERAL SALPINGO OOPHORECTOMY;  Surgeon: Will Bonnet, MD;  Location: [facility] ORS;  Service: Gynecology;  Laterality: Bilateral;  . MASTECTOMY Bilateral 2010    Family History  Problem Relation Age of Onset  . Cancer Paternal Aunt   . Cancer Paternal Uncle   . Cancer Paternal Grandfather   . Hypertension Brother   . Diabetes Paternal Grandmother   A paternal aunt had breast cancer age 22, a paternal uncle had prostate cancer, and a paternal grandfather had leukemia.  Social History:  reports that she has never smoked. She has never  used smokeless tobacco. She reports that she does not drink alcohol or use drugs.  She is from [place].  She moved to [place] in 2015.  She moved to [place] in 04/2014.  She recently moved into the [place] area.  She has 2 children (boy and girl).  Her family will likely be moving to [facility] in December.  They are looking for a home.  She is going on a trip to [place] 10/18/2016 - 10/24/2016.  The patient is accompanied by the Spanish interpreter today.  Allergies:  Allergies  Allergen Reactions  . Taxol [Paclitaxel] Anaphylaxis  . Aspirin Swelling  . Penicillins Swelling  . Zofran [Ondansetron Hcl] Nausea And Vomiting    Current Medications: Current Outpatient Prescriptions  Medication Sig Dispense Refill  . CALCIUM-VITAMIN D PO Take 1 tablet by mouth daily.    . Cyanocobalamin (VITAMIN B-12 PO) Take by mouth.    . dexamethasone (DECADRON) 4 MG tablet Take 1 tablet (4 mg total) by mouth daily. 25 tablet 0  . docusate sodium (COLACE) 100 MG capsule Take 1 tablet once or twice daily as needed for constipation while taking narcotic pain medicine (Patient not taking: Reported on 09/29/2016) 30 capsule 0  . ibuprofen (ADVIL,MOTRIN) 800 MG tablet Take 800 mg by mouth every 8 (eight) hours as needed (Takes 1/2 tablet prn).    . Multiple Vitamins-Minerals (CENTRUM ADULTS PO) Take 1 tablet by mouth daily.    Marland Kitchen omeprazole (PRILOSEC) 20 MG capsule Take 20 mg by  mouth daily.  2  . pantoprazole (PROTONIX) 20 MG tablet Take 1 tablet by mouth daily 30 tablet 3  . potassium chloride SA (K-DUR,KLOR-CON) 20 MEQ tablet Take 1 tablet (20 mEq total) by mouth 2 (two) times daily. for 1 days then 1 pill a day x 2 days. 10 tablet 0  . promethazine (PHENERGAN) 25 MG tablet Take 1 tablet (25 mg total) by mouth every 6 (six) hours as needed for nausea or vomiting. 30 tablet 2  . traMADol (ULTRAM) 50 MG tablet Take 50 mg by mouth every 12 (twelve) hours as needed.     No current  facility-administered medications for this visit.     Review of Systems:  GENERAL:  Feels "good".  "Hyper on steroids".  No fevers or sweats.  Weight down 1 pound since last appointment. PERFORMANCE STATUS (ECOG):  1 HEENT:  No visual changes, runny nose, sore throat, mouth sores or tenderness.  Excess saliva. Lungs: No shortness of breath.  Cough.  No hemoptysis. Cardiac:  No chest pain, palpitations, orthopnea, or PND. GI:  Nausea before she eats breakfast.  No vomiting, diarrhea, constipation, melena or hematochezia.  GU:  No urgency, frequency, dysuria or hematuria. Musculoskeletal: No pain.  No muscle tenderness. Extremities:  Little right hip pain.  No pain or swelling. Skin:  No rashes or irritation. Neuro:  No headache, numbness or weakness, balance or coordination issues.  Endocrine:  No diabetes, thyroid issues, or night sweats.  Hot flashes. Psych:  No mood changes, depression or anxiety. Pain:  No pain pills since 10/13/2016. Review of systems:  All other systems reviewed and found to be negative.  Physical Exam: Last menstrual period 03/19/2015. GENERAL:  Well developed, well nourished, woman sitting comfortably in the exam room in no acute distress. MENTAL STATUS:  Alert and oriented to person, place and time. HEAD: Wearing a black wrap.  Normocephalic, atraumatic, face symmetric, no Cushingoid features. EYES:  Brown eyes. Pupils equal round and reactive to light and accomodation.  No conjunctivitis or scleral icterus. ENT:  Oropharynx clear without lesion.  Tongue normal. Mucous membranes moist.  RESPIRATORY:  Decreased breath sounds right base (chronic secondary to elevated hemidiaphragm).  Clear to auscultation without rales, wheezes or rhonchi. CARDIOVASCULAR:  Regular rate and rhythm without murmur, rub or gallop. ABDOMEN:  Soft, non-tender, with active bowel sounds, and no hepatosplenomegaly.  No masses. SKIN:  No rashes, bruises or ulcers. EXTREMITIES:  No edema,  no skin discoloration or tenderness.  No palpable cords. LYMPH NODES:  No palpable cervical, supraclavicular, axillary or inguinal adenopathy  NEUROLOGICAL:  Appropriate. PSYCH:  Appropriate.      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 No lymph invasion. Tumor cellularity 20%. [id number]. 07/13/2008: Left mastectomy: Residual ductal carcinoma in situ, high nuclear grade, deep margin negative ypTisNO(sn)MX. 10/25/2010: Abdominal and pelvic ultrasound: questionable lesion near gallbladder fossa recommend MRI follow up. 03/02/2011: Left and right frontal excisional brain biopsy: Adenocarcinoma, metastatic, NOS: ER +, PR 5% +, Her 2 NEG. 03/04/2011: [facility] testing of IHC4 residual recurrence risk score of 114 showing an 8 year recurrence reate of 57%. ER POSTIVE, PR POSITIVE, HER 2 POSTIVE (previously documented negative in 2009) cutoff of 361 patient scores  426. ki67 71%.  08/05/2013: Right breast FNA supraclavicular region: positive for metastatic carcinoma. ER/PR/HER2 not reported. 08/19/2014: Right cervical lymph node. Adenocarcinoma with features consistent with metastatic breast carcinoma. ER/PR/HER2 insufficient tissue. 09/04/2014: Repeat craniotomy, resection of right frontal tumor (metastatic breast cancer). ER 90-100% PR 51-60% HER2 - 09/29/2014: Right cervical lymph node, metastatic carcinoma c/w breast primary, ER 100% PR 20% HER2-  Imaging: 08/15/2007: Bilateral Mammogram: Dense breasts with solid lesion at 6:00, 7:00, and 9:00 of right breast, solid lesion at 2:00 position left breast BIRADS 4B. 09/10/2007: Breast MRI: Three  solid irregular enhancing lesions of the right breast 2.1 x 3.2, 2.8 x 2.7, and 1.8 x 1.6 cm. Mildly enlarged enhancing nodule right axilla. Left breast clumped enhancement midportion suspicious for malignancy. 03/01/2011: Brain MRI: At least two juxtacortical, intra-axial masses with extensive surrounding vasogenic edema most consistent with intracranial metastasis. 07/22/2013: Brain MRI +/- contrast: interval increase in enhancing component on T2 FLAIR now measuring 1.0cm x 1.1 cm worrisome for progression of disease. 01/21/2014:  PET scan: Hypermetabolic small right supraclavicular and right upper paratracheal lymph nodes suspicious for mets. Hypermetabolic lymph node in the right paratracheal upper mediastinum that measure approximately 0.9 x 0.5cm. (of note no impressions made of areas noted on brain MRI 07/22/13).  01/21/2014: Head MRI: Right frontal enhancing lesion has shown interval increase in size with surrounding edema and local mass effect (measured 1.8x2.1x1.6, previously 1.2x1.1x1.0) 07/16/2014: Head MRI:  Right anterior frontal enhancing mass 2.5 x 2.0 cm compatible with a metastatic lesion has increased and changed in configuration from previous 2.0 x 1.8 cm, formerly multilobular. 08/06/2014: PET scan:  Multiple bilateral FDG avid low cervical, supraclavicular, upper mediastinal, and right internal mammary lymphadenopathy, likely representing metastatic disease. A cervical node in the right lower neck should be amenable to percutaneous sampling.  12/2014: Head MRI:  Evolution of right frontal postoperative changes status post tumor resection at the vertex. Expected postoperative changes. Minimal marginal enhancement resection bed with some adjacent posterior intrinsic T1 signal again seen. Decreasing T2/FLAIR adjacent parenchymal changes.  Stable resection cavity is left posterior frontal and right parieto-occipital regions. 04/20/2015 : PET scan: Cervical and thoracic nodal metastasis.   There was multifocal osseous metastasis (left transverse T4 process, left humeral head, right iliac wing, and left side of the sacrum).  Incidental findings included right nephrolithiasis.   04/22/2015: Bone scan: Corresponded with PET-CT with metastatic lesions evident in the left humeral head, left posterior aspect of T4, left aspect of S1, and the right iliac crest.  There was no other definite foci of metastatic disease to bone.  Uptake in the skull was most suggestive of the previous right frontal craniotomy. 05/24/2015: Bone density:  T-score of -0.8 in the right femoral neck (normal). 07/07/2015:  Head MRI:  Metastatic breast cancer with 3 resection cavities. The anterior right frontal cavity was positive for nodular marginal enhancement, new from brain MRI report 01/07/2015, and consistent with recurrent disease.  08/12/2015:  Lumbar Spine MRI: Osseous metastatic disease at L3, L4, L5, S1, and in the left iliac bone posteriorly. There was no definite epidural metastatic disease. 08/20/2015:  PET scan:  Response to therapy in the lower cervical and thoracic adenopathy.  There was a mixed response to multifocal osseous metastases (some new lesions).  12/06/2015:  Head MRI:  Decreased nodular contrast enhancement at the right frontal lobe treatment site.  There was unchanged appearance of treatment sites within the bilateral parietal lobe size without residual or recurrent disease.  There were no  new metastatic lesions. 02/18/2016: PET scan: Mixed response to therapy. There hadbeen improvement in right cervical lymphadenopathy and in several osseous lesions. There wereseveral other osseous lesions in the pelvis with increasing hypermetabolism compared to the prior study. As the majority of the osseous lesions wereincreasingly sclerotic, some of this hypermetabolism could simply reflect bony healing. There was no new extraskeletal metastatic disease is noted in the neck, chest, abdomen or  pelvis. 03/06/2016: Head MRI: Unchanged small focus of nodular enhancement at the right frontal resection site. There was unchanged appearance of the 2 other resection sites without abnormal enhancement. There was no evidence of new intracranial metastases. 05/23/2016:  Bone scan:  Stable focus of abnormal uptake seen in right frontal skull consistent with prior craniotomy.  There were areas of abnormal uptake identified in the left humeral head, T4 and S1 levels and right iliac crest that were present but decreased in intensity compared to prior exam suggesting improvement.  There was a new foci of abnormal uptake are noted in a right lower rib,T12 and L1 levels of spine concerning for metastatic disease. 06/07/2016:  Thoracic and lumbar spine MRI:  multiple low T1/T2 weighted signal, non enhancing lesions within the thoracic spine, which corresponded in size and location to sclerotic lesions demonstrated on the PET CT of 02/18/2016.  There were no new thoracic lesions.  There was minimal residual contrast enhancement within the L5 vertebral body metastatic lesion, decreased compared to the MRI of 08/12/2015.  There was minimal contrast enhancement at the periphery of the lesion within the L1 vertebral body. This lesion was new compared to MRI of 08/12/2015, but unchanged in size and location compared to PET CT of 02/18/2016.  There was no epidural disease, spinal canal stenosis or neural foraminal encroachment. 06/13/2016:  Head MRI revealed no evidence of residual or recurrent disease. 08/11/2016:  Chest, abdomen, and pelvic CT revealed  new 8 mm right supraclavicular, 12 mm right paratracheal and 10 mm subcarinal lymphadenopathy suspicious for metastatic nodal recurrence.  There was extensive patchy sclerotic osseous metastases throughout the axial and proximal appendicular skeleton appear stable in size and distribution although generally increased in sclerosis since 02/18/2016 PET-CT, which was a  nonspecific change that could reflect treatment effect or progression.  There was no additional sites of metastatic disease in the chest, abdomen or pelvis. 08/11/2016:  Bone scan revealed scattered foci of abnormal osseous tracer accumulation consistent with osseous metastatic disease with new sites of abnormal uptake in the thoracic spine at T5 in the posterior LEFT sixth rib. 08/31/2016:  Chest CT angiogram revealed interval confluent opacity in the posterior aspect of the right upper lobe suspicious for pneumonia. There was a small right-sided pleural effusion and adjacent right lower lobe atelectasis. 10/27/2016:  Head MRI revealed 9 new small brain metastases since 05/2016. The largest was a 10 mm metastasis in the posterior right cerebellum. The smallest lesions were punctate, and 1 of these along the posterior inferior left cerebellar hemisphere was suspicious for leptomeningeal based disease.  There was no associated edema or mass effect.   Labs:  No visits with results within 3 Day(s) from this visit.  Latest known visit with results is:  Infusion on 11/27/2016  Component Date Value Ref Range Status  . Sodium 11/27/2016 135  135 - 145 mmol/L Final  . Potassium 11/27/2016 3.6  3.5 - 5.1 mmol/L Final  . Chloride 11/27/2016 101  101 - 111 mmol/L Final  . CO2 11/27/2016 27  22 - 32 mmol/L Final  .  Glucose, Bld 11/27/2016 90  65 - 99 mg/dL Final  . BUN 11/27/2016 24* 6 - 20 mg/dL Final  . Creatinine, Ser 11/27/2016 0.62  0.44 - 1.00 mg/dL Final  . Calcium 11/27/2016 9.1  8.9 - 10.3 mg/dL Final  . Total Protein 11/27/2016 6.7  6.5 - 8.1 g/dL Final  . Albumin 11/27/2016 3.4* 3.5 - 5.0 g/dL Final  . AST 11/27/2016 64* 15 - 41 U/L Final  . ALT 11/27/2016 100* 14 - 54 U/L Final  . Alkaline Phosphatase 11/27/2016 140* 38 - 126 U/L Final  . Total Bilirubin 11/27/2016 0.9  0.3 - 1.2 mg/dL Final  . GFR calc non Af Amer 11/27/2016 >60  >60 mL/min Final  . GFR calc Af Amer 11/27/2016 >60  >60  mL/min Final   Comment: (NOTE) The eGFR has been calculated using the CKD EPI equation. This calculation has not been validated in all clinical situations. eGFR's persistently <60 mL/min signify possible Chronic Kidney Disease.   . Anion gap 11/27/2016 7  5 - 15 Final  . WBC 11/27/2016 12.8* 3.6 - 11.0 K/uL Final  . RBC 11/27/2016 3.84  3.80 - 5.20 MIL/uL Final  . Hemoglobin 11/27/2016 11.3* 12.0 - 16.0 g/dL Final  . HCT 11/27/2016 33.5* 35.0 - 47.0 % Final  . MCV 11/27/2016 87.2  80.0 - 100.0 fL Final  . MCH 11/27/2016 29.4  26.0 - 34.0 pg Final  . MCHC 11/27/2016 33.7  32.0 - 36.0 g/dL Final  . RDW 11/27/2016 17.2* 11.5 - 14.5 % Final  . Platelets 11/27/2016 262  150 - 440 K/uL Final  . Neutrophils Relative % 11/27/2016 85  % Final  . Neutro Abs 11/27/2016 10.9* 1.4 - 6.5 K/uL Final  . Lymphocytes Relative 11/27/2016 6  % Final  . Lymphs Abs 11/27/2016 0.7* 1.0 - 3.6 K/uL Final  . Monocytes Relative 11/27/2016 8  % Final  . Monocytes Absolute 11/27/2016 1.0* 0.2 - 0.9 K/uL Final  . Eosinophils Relative 11/27/2016 1  % Final  . Eosinophils Absolute 11/27/2016 0.1  0 - 0.7 K/uL Final  . Basophils Relative 11/27/2016 0  % Final  . Basophils Absolute 11/27/2016 0.0  0 - 0.1 K/uL Final  . CA 27.29 11/27/2016 1322.6* 0.0 - 38.6 U/mL Final   Comment: (NOTE) Specimen was diluted in order to obtain results. Results were repeated. Research scientist (life sciences) Performed At: [facility] [place], [place] [id number] Lindon Romp MD GU:[id number]   . Magnesium 11/27/2016 2.0  1.7 - 2.4 mg/dL Final    Assessment:  Joann Jorge [name] is a 46 y.o. 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  She initially presented in 2009 while living in [place] with multi-focal right breast cancer with positive lymph node(s) which was ER+, PR+(low), and HER2/neu+. She received neoadjuvant chemotherapy (AC x 4 every 3 weeks followed by Taxol/Abraxane +  carboplatin weekly x 12) without anti-HER2 treatment. BRCA1/2 testing was negative on 08/15/2016.  On 07/14/2008, she underwent bilateral mastectomies followed by reconstruction.  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   Zero of 11 lymph nodes on the right were positive for malignancy. 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 Deep margin was negative. Zero of 2 lymph nodes were positive for metastatic disease.  She received adjuvant radiation to the right breast.  She received adjuvant tamoxifen and Zoladex.  She could not tolerate symptoms of joint pain and tamoxifen was discontinued  after 1-2 months.  Zoladex was continued for approximately 1.5 years.  She was diagnosed with brain metastases in 02/2011.  Pathology revealed ER+, PR+(low), and HER2/neu-.  She underwent resection followed by [facility].  On 03/09/2011, original breast tissue (09/03/2007) sent to [facility] testing.  Testing revealed ER+, PR+(borderline), and HER2/neu positive (different than initial testing).  She received Herceptin for 1 year beginning in 2013.  She then began Tykerb and Xeloda after completion of Herceptin (2014).  She was on extremely low, and probably sub-therapeutic, dosing of lapatinib + capecitabine.   She developed right supraclavicular adenopathy in 2015. She was noted to have slow disease progression in the right frontal/parietal lesion with minimal presence of systemic disease on PET scans. Capecitabine + lapatinib were discontinued in 04/2014.   She underwent repeat craniotomy with resection of brain metastasis on 09/04/2014.  Biopsy of the right supraclavicular lymph node was ER+,PR+,HER2/neu-.  She started tamoxifen in 09/2014, but discontinued it secondary to pain in hip, back, and shoulder.  She restarted tamoxifen on 04/23/2015.  Tamoxifen was discontinued on 08/23/2015 secondary to progressive disease.  She received Zoladex on 04/30/2015.   She underwent laparoscopic bilateral oophorectomy on 05/18/2015.  She receives monthly Xgeva (began 04/30/2015; last 11/13/2016).  She is post-menopausal.  She began Faslodex on 08/23/2015 (last 04/04/2016).  She received 7 cycles of Ibrance (09/20/2015 - 06/18/2016).  Patient received palliative radiation 30 Gy to the left humerus and lumbar spine from 08/25/2015 - 09/09/2015.  She is not eligible for a clinical trial.  CA27.29 has been followed: 177.8 on 04/16/2015, 276.4 on 05/28/2015, 287.5 on 07/13/2015, 333.8 on 07/26/2015, 412.9 on 08/23/2015, 315.6 on 09/20/2015, 188.8 on 10/18/2015, 151.5 on 11/15/2015, 122.7 on 12/10/2015, 94.6 on 01/10/2016, 80.9 on 02/07/2016, 79 on 03/06/2016, 84.8 on 04/04/2016, 85.2 on 05/02/2016, 91.1 on 05/30/2016, 121 on 06/30/2016, 182.2 on 07/31/2016, 420.5 on 10/09/2016, 564.6 on 10/30/2016, and 1322.6 on 11/27/2016.  Head MRI on 06/13/2016 revealed no evidence of residual or recurrent disease.  Head MRI on 10/27/2016 revealed 9 new small brain metastases since 05/2016. The largest was a 10 mm metastasis in the posterior right cerebellum. The smallest lesions were punctate, and 1 of these along the posterior inferior left cerebellar hemisphere was suspicious for leptomeningeal based disease.  There was no associated edema or mass effect.  She completed whole brain radiation (11/08/2016 - 11/24/2016).  She is on a steroid taper.  Chest, abdomen, and pelvic CT on 08/11/2016 revealed a new 8 mm right supraclavicular, 12 mm right paratracheal and 10 mm subcarinal lymphadenopathy suspicious for metastatic nodal recurrence.  There was extensive patchy sclerotic osseous metastases throughout the axial and proximal appendicular skeleton appear stable in size and distribution although generally increased in sclerosis since 02/18/2016 PET-CT, which was a nonspecific change that could reflect treatment effect or progression.  There was no additional sites of metastatic disease in  the chest, abdomen or pelvis.  Chest CT angiogram on 08/31/2016 revealed interval confluent opacity in the posterior aspect of the right upper lobe suspicious for pneumonia. There was a small right-sided pleural effusion and adjacent right lower lobe atelectasis.  She was treated with Levaquin.  Bone scan on 08/11/2016 revealed scattered foci of abnormal osseous tracer accumulation consistent with osseous metastatic disease with new sites of abnormal uptake in the thoracic spine at T5 in the posterior LEFT sixth rib.  Bone density study on 05/24/2015 revealed a T-score of -0.8 in the right femoral neck (normal).  She completed palliative radiation to T12-L1  of 3000 cGy (06/21/2016 - 07/04/2016).  She received palliative radiation to the left hip from 08/17/2016 - 08/30/2016.  She is on a Fentanyl 25 mcg/hr patch.  Pain is well controlled.  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 Tumor was ER positive (> 90%) PR negative and HER-2/neu negative (1+ IHC).    Xeloda is cost prohibitive.  She does not wish to pursue navelbine or gemcitabine.  She is s/p day 8 of cycle #2 Abraxane (10/09/2016 - 10/30/2016).  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  Work-up on 10/25/2015 revealed a B12 of 141 (low).  B12 was 205 on 12/13/2015 with an MMA of 171 (normal) on 01/10/2016.  Ferritin was 43. Iron studies included a saturation of 13% and a TIBC of 354. TSH and folate were normal.  Reticulocyte count was 2.2%.  She declined B12 injections.  She started oral B12 1000 mcg on 11/05/2015.  She takes her B12 sporadically.  B12 level was 205 (low normal) on 12/13/2015.  Diet is good.  She denies any melena or hematochezia.  Symptomatically, she denies any pain.  She denies any physical complaints today. LFTs increased; AST 64, ALT 100, and Alkaline phosphatase 140. Exam is  stable.  Plan: 1.  Labs today: CBC with diff, CMP, Mg, CA27.29. 2.  Additional labs secondary to elevated LFT:  hepatitis B core antibody, hepatitis B surface anticbody, hepatitis C antibody 3.  Hold chemotherapy today. 4.  Continue calcium supplements as previously directed.  Coordinate with your antiemetics to control nausea.  5.  Review last imaging studies (08/11/2016) and new increased LFTs.  Discuss restaging prior to reinitiation of chemotherapy.   6.  Schedule chest, abdomen, and pelvis CT on 11/28/2016. 7.  Discuss routine dental cleaning (patient questioned).  Will need pre-treatment with antibiotics if oral bleeding anticipated as she has a port--a-cath.  Patient to discuss with her dentist.  8.  Discuss influenza and pneumonia vaccinations. Patient ok to receive given her adequate blood counts.  She is NOT to receive any live virus vaccines. 9.  RTC on 11/30/2016 for MD assessment, labs (CBC with diff, CMP), review of imaging studies, and week 1 of cycle # 3 Abraxane   Melissa C. Mike Gip, MD  11/30/2016, 6:49 AM

## 2016-12-01 ENCOUNTER — Inpatient Hospital Stay: Payer: Medicare HMO

## 2016-12-01 ENCOUNTER — Other Ambulatory Visit: Payer: Self-pay | Admitting: *Deleted

## 2016-12-01 ENCOUNTER — Inpatient Hospital Stay (HOSPITAL_BASED_OUTPATIENT_CLINIC_OR_DEPARTMENT_OTHER): Payer: Medicare HMO | Admitting: Hematology and Oncology

## 2016-12-01 ENCOUNTER — Other Ambulatory Visit: Payer: Self-pay | Admitting: Hematology and Oncology

## 2016-12-01 ENCOUNTER — Encounter: Payer: Self-pay | Admitting: Hematology and Oncology

## 2016-12-01 VITALS — BP 118/82 | HR 114 | Temp 99.8°F | Ht 63.0 in | Wt 147.0 lb

## 2016-12-01 DIAGNOSIS — I313 Pericardial effusion (noninflammatory): Secondary | ICD-10-CM

## 2016-12-01 DIAGNOSIS — Z79899 Other long term (current) drug therapy: Secondary | ICD-10-CM

## 2016-12-01 DIAGNOSIS — C7970 Secondary malignant neoplasm of unspecified adrenal gland: Secondary | ICD-10-CM

## 2016-12-01 DIAGNOSIS — D0512 Intraductal carcinoma in situ of left breast: Secondary | ICD-10-CM | POA: Diagnosis not present

## 2016-12-01 DIAGNOSIS — Z923 Personal history of irradiation: Secondary | ICD-10-CM | POA: Diagnosis not present

## 2016-12-01 DIAGNOSIS — Z880 Allergy status to penicillin: Secondary | ICD-10-CM

## 2016-12-01 DIAGNOSIS — C7931 Secondary malignant neoplasm of brain: Secondary | ICD-10-CM | POA: Diagnosis not present

## 2016-12-01 DIAGNOSIS — Z9221 Personal history of antineoplastic chemotherapy: Secondary | ICD-10-CM | POA: Diagnosis not present

## 2016-12-01 DIAGNOSIS — R5381 Other malaise: Secondary | ICD-10-CM

## 2016-12-01 DIAGNOSIS — C7951 Secondary malignant neoplasm of bone: Secondary | ICD-10-CM | POA: Diagnosis not present

## 2016-12-01 DIAGNOSIS — D649 Anemia, unspecified: Secondary | ICD-10-CM

## 2016-12-01 DIAGNOSIS — C787 Secondary malignant neoplasm of liver and intrahepatic bile duct: Secondary | ICD-10-CM

## 2016-12-01 DIAGNOSIS — C770 Secondary and unspecified malignant neoplasm of lymph nodes of head, face and neck: Secondary | ICD-10-CM

## 2016-12-01 DIAGNOSIS — C50811 Malignant neoplasm of overlapping sites of right female breast: Secondary | ICD-10-CM | POA: Diagnosis not present

## 2016-12-01 DIAGNOSIS — Z5111 Encounter for antineoplastic chemotherapy: Secondary | ICD-10-CM | POA: Diagnosis not present

## 2016-12-01 DIAGNOSIS — Z7952 Long term (current) use of systemic steroids: Secondary | ICD-10-CM

## 2016-12-01 DIAGNOSIS — C50912 Malignant neoplasm of unspecified site of left female breast: Secondary | ICD-10-CM

## 2016-12-01 DIAGNOSIS — Z170 Estrogen receptor positive status [ER+]: Secondary | ICD-10-CM | POA: Diagnosis not present

## 2016-12-01 DIAGNOSIS — C7889 Secondary malignant neoplasm of other digestive organs: Secondary | ICD-10-CM

## 2016-12-01 DIAGNOSIS — C50919 Malignant neoplasm of unspecified site of unspecified female breast: Secondary | ICD-10-CM

## 2016-12-01 DIAGNOSIS — Z9013 Acquired absence of bilateral breasts and nipples: Secondary | ICD-10-CM

## 2016-12-01 DIAGNOSIS — E538 Deficiency of other specified B group vitamins: Secondary | ICD-10-CM

## 2016-12-01 DIAGNOSIS — Z8042 Family history of malignant neoplasm of prostate: Secondary | ICD-10-CM

## 2016-12-01 DIAGNOSIS — Z803 Family history of malignant neoplasm of breast: Secondary | ICD-10-CM

## 2016-12-01 DIAGNOSIS — C7801 Secondary malignant neoplasm of right lung: Secondary | ICD-10-CM

## 2016-12-01 DIAGNOSIS — R7989 Other specified abnormal findings of blood chemistry: Secondary | ICD-10-CM

## 2016-12-01 DIAGNOSIS — K869 Disease of pancreas, unspecified: Secondary | ICD-10-CM

## 2016-12-01 DIAGNOSIS — M25551 Pain in right hip: Secondary | ICD-10-CM

## 2016-12-01 DIAGNOSIS — R945 Abnormal results of liver function studies: Secondary | ICD-10-CM

## 2016-12-01 DIAGNOSIS — Z780 Asymptomatic menopausal state: Secondary | ICD-10-CM

## 2016-12-01 DIAGNOSIS — Z7189 Other specified counseling: Secondary | ICD-10-CM

## 2016-12-01 DIAGNOSIS — K8689 Other specified diseases of pancreas: Secondary | ICD-10-CM

## 2016-12-01 DIAGNOSIS — Z853 Personal history of malignant neoplasm of breast: Secondary | ICD-10-CM

## 2016-12-01 DIAGNOSIS — R110 Nausea: Secondary | ICD-10-CM

## 2016-12-01 DIAGNOSIS — I3139 Other pericardial effusion (noninflammatory): Secondary | ICD-10-CM

## 2016-12-01 LAB — CBC WITH DIFFERENTIAL/PLATELET
Basophils Absolute: 0 10*3/uL (ref 0–0.1)
Basophils Relative: 0 %
Eosinophils Absolute: 0.1 10*3/uL (ref 0–0.7)
Eosinophils Relative: 1 %
HCT: 31.6 % — ABNORMAL LOW (ref 35.0–47.0)
Hemoglobin: 10.7 g/dL — ABNORMAL LOW (ref 12.0–16.0)
Lymphocytes Relative: 4 %
Lymphs Abs: 0.5 10*3/uL — ABNORMAL LOW (ref 1.0–3.6)
MCH: 29.7 pg (ref 26.0–34.0)
MCHC: 33.8 g/dL (ref 32.0–36.0)
MCV: 87.9 fL (ref 80.0–100.0)
Monocytes Absolute: 0.9 10*3/uL (ref 0.2–0.9)
Monocytes Relative: 8 %
Neutro Abs: 10 10*3/uL — ABNORMAL HIGH (ref 1.4–6.5)
Neutrophils Relative %: 87 %
Platelets: 269 10*3/uL (ref 150–440)
RBC: 3.59 MIL/uL — ABNORMAL LOW (ref 3.80–5.20)
RDW: 17.7 % — ABNORMAL HIGH (ref 11.5–14.5)
WBC: 11.5 10*3/uL — ABNORMAL HIGH (ref 3.6–11.0)

## 2016-12-01 LAB — COMPREHENSIVE METABOLIC PANEL
ALT: 66 U/L — ABNORMAL HIGH (ref 14–54)
AST: 60 U/L — ABNORMAL HIGH (ref 15–41)
Albumin: 3.2 g/dL — ABNORMAL LOW (ref 3.5–5.0)
Alkaline Phosphatase: 169 U/L — ABNORMAL HIGH (ref 38–126)
Anion gap: 8 (ref 5–15)
BUN: 13 mg/dL (ref 6–20)
CO2: 25 mmol/L (ref 22–32)
Calcium: 8.9 mg/dL (ref 8.9–10.3)
Chloride: 101 mmol/L (ref 101–111)
Creatinine, Ser: 0.6 mg/dL (ref 0.44–1.00)
GFR calc Af Amer: >60 mL/min (ref >60)
GFR calc non Af Amer: >60 mL/min (ref >60)
Glucose, Bld: 114 mg/dL — ABNORMAL HIGH (ref 65–99)
Potassium: 3.5 mmol/L (ref 3.5–5.1)
Sodium: 134 mmol/L — ABNORMAL LOW (ref 135–145)
Total Bilirubin: 1 mg/dL (ref 0.3–1.2)
Total Protein: 7 g/dL (ref 6.5–8.1)

## 2016-12-01 MED ORDER — PROMETHAZINE HCL 25 MG PO TABS
25.0000 mg | ORAL_TABLET | Freq: Once | ORAL | Status: AC
  Administered 2016-12-01: 25 mg via ORAL
  Filled 2016-12-01: qty 1

## 2016-12-01 MED ORDER — HEPARIN SOD (PORK) LOCK FLUSH 100 UNIT/ML IV SOLN
500.0000 [IU] | Freq: Once | INTRAVENOUS | Status: AC | PRN
  Administered 2016-12-01: 500 [IU]
  Filled 2016-12-01: qty 5

## 2016-12-01 MED ORDER — PACLITAXEL PROTEIN-BOUND CHEMO INJECTION 100 MG
100.0000 mg/m2 | Freq: Once | INTRAVENOUS | Status: AC
  Administered 2016-12-01: 175 mg via INTRAVENOUS
  Filled 2016-12-01: qty 35

## 2016-12-01 MED ORDER — SODIUM CHLORIDE 0.9 % IV SOLN
Freq: Once | INTRAVENOUS | Status: AC
  Administered 2016-12-01: 12:00:00 via INTRAVENOUS
  Filled 2016-12-01: qty 1000

## 2016-12-01 NOTE — Progress Notes (Signed)
[facility] day:  12/01/16   Chief Complaint: Melinda Tanner is a 46 y.o. female with metastatic breast cancer with brain metastasis who is seen for review of interval CT scans and day 1 of cycle #3 Abraxane.   HPI:  The patient was last seen in the medical oncology clinic on 11/27/2016.  She completed cranial radiation on 11/24/2016.  She denied any pain.  Liver function tests were elevated (new).  Decision was made to proceed with restaging prior to reinitiation of chemotherapy.  CA27.29 has increased from 564.6 to 1322.6 over the course of the last month. Chest, abdomen, and pelvic CT on 11/29/2016 revealed multiple hepatic metastasis, a 10 mm adrenal metastasis, multiple abdominal nodes, 4.2 x 2.1 cm pancreatic head mass and a separate 1.1 cm lesion in the anterior pancreatic body, new 9 mm paraesophageal lymph node, trace pericardial thickening/fluid, 2 focal lung nodules (10 mm and 5 mm) in the RUL, and an enlarging blastic lesion of T5.  Symptomatically, patient feeling "ok" today. She notes "malaise" and nausea earlier in the week.  Symptoms have resolved. She denies pain today.    Past Medical History:  Diagnosis Date  . Brain cancer ([place]) 2012   Met. from Breast  . Breast cancer ([place]) 2009  . Complication of anesthesia    nausea, "drops in potassium and magnesium"  . Seizures ([place])     Past Surgical History:  Procedure Laterality Date  . BRAIN SURGERY  2012, 2106  . CESAREAN SECTION    . LAPAROSCOPIC BILATERAL SALPINGO OOPHERECTOMY Bilateral 05/18/2015   Procedure: LAPAROSCOPIC BILATERAL SALPINGO OOPHORECTOMY;  Surgeon: Will Bonnet, MD;  Location: [facility] ORS;  Service: Gynecology;  Laterality: Bilateral;  . MASTECTOMY Bilateral 2010    Family History  Problem Relation Age of Onset  . Cancer Paternal Aunt   . Cancer Paternal Uncle   . Cancer Paternal Grandfather   . Hypertension Brother   . Diabetes Paternal  Grandmother   A paternal aunt had breast cancer age 15, a paternal uncle had prostate cancer, and a paternal grandfather had leukemia.  Social History:  reports that she has never smoked. She has never used smokeless tobacco. She reports that she does not drink alcohol or use drugs.  She is from [place].  She moved to [place] in 2015.  She moved to [place] in 04/2014.  She recently moved into the [place] area.  She has 2 children (boy and girl).  Her family will likely be moving to [facility] in December.  They are looking for a home.  She is going on a trip to [place] 10/18/2016 - 10/24/2016.  The patient is accompanied by her husband and the Spanish interpreter today.  Allergies:  Allergies  Allergen Reactions  . Taxol [Paclitaxel] Anaphylaxis  . Aspirin Swelling  . Penicillins Swelling  . Zofran [Ondansetron Hcl] Nausea And Vomiting    Current Medications: Current Outpatient Prescriptions  Medication Sig Dispense Refill  . CALCIUM-VITAMIN D PO Take 1 tablet by mouth daily.    . capecitabine (XELODA) 150 MG tablet     . Cyanocobalamin (VITAMIN B-12 PO) Take by mouth.    . dexamethasone (DECADRON) 4 MG tablet Take 1 tablet (4 mg total) by mouth daily. 25 tablet 0  . docusate sodium (COLACE) 100 MG capsule Take 1 tablet once or twice daily as needed for constipation while taking narcotic pain medicine 30 capsule 0  . ibuprofen (ADVIL,MOTRIN) 800 MG tablet  Take 800 mg by mouth every 8 (eight) hours as needed (Takes 1/2 tablet prn).    . Multiple Vitamins-Minerals (CENTRUM ADULTS PO) Take 1 tablet by mouth daily.    Marland Kitchen omeprazole (PRILOSEC) 20 MG capsule Take 20 mg by mouth daily.  2  . pantoprazole (PROTONIX) 20 MG tablet Take 1 tablet by mouth daily 30 tablet 3  . potassium chloride SA (K-DUR,KLOR-CON) 20 MEQ tablet Take 1 tablet (20 mEq total) by mouth 2 (two) times daily. for 1 days then 1 pill a day x 2 days. 10 tablet 0  . promethazine (PHENERGAN) 25 MG tablet Take 1  tablet (25 mg total) by mouth every 6 (six) hours as needed for nausea or vomiting. 30 tablet 2  . traMADol (ULTRAM) 50 MG tablet Take 50 mg by mouth every 12 (twelve) hours as needed.     No current facility-administered medications for this visit.     Review of Systems:  GENERAL:  Feels "ok".  No fevers or sweats.  Weight down 1 pound since last appointment. PERFORMANCE STATUS (ECOG):  1 HEENT:  No visual changes, runny nose, sore throat, mouth sores or tenderness.  Excess saliva. Lungs: No shortness of breath.  Cough.  No hemoptysis. Cardiac:  No chest pain, palpitations, orthopnea, or PND. GI:  Nausea before she eats breakfast.  No vomiting, diarrhea, constipation, melena or hematochezia.  GU:  No urgency, frequency, dysuria or hematuria. Musculoskeletal: No pain.  No muscle tenderness. Extremities:  No pain or swelling. Skin:  No rashes or irritation. Neuro:  No headache, numbness or weakness, balance or coordination issues.  Endocrine:  No diabetes, thyroid issues, or night sweats.  Hot flashes. Psych:  No mood changes, depression or anxiety. Pain:  No pain pills since 10/13/2016. Review of systems:  All other systems reviewed and found to be negative.  Physical Exam: Blood pressure 118/82, pulse (!) 114, temperature 99.8 F (37.7 C), temperature source Oral, height 5' 3"  (1.6 m), weight 147 lb (66.7 kg), last menstrual period 03/19/2015. GENERAL:  Well developed, well nourished, woman sitting comfortably in the exam room in no acute distress. MENTAL STATUS:  Alert and oriented to person, place and time. HEAD: Wearing a beige cap.  Normocephalic, atraumatic, face symmetric, no Cushingoid features. EYES:  Brown eyes. Pupils equal round and reactive to light and accomodation.  No conjunctivitis or scleral icterus. ENT:  Oropharynx clear without lesion.  Tongue normal. Mucous membranes moist.  RESPIRATORY:  Decreased breath sounds right base (chronic).  Clear to auscultation without  rales, wheezes or rhonchi. CARDIOVASCULAR:  Regular rate and rhythm without murmur, rub or gallop. ABDOMEN:  Soft, non-tender, with active bowel sounds, and no hepatosplenomegaly.  No masses. SKIN:  No rashes, bruises or ulcers. EXTREMITIES:  No edema, no skin discoloration or tenderness.  No palpable cords. LYMPH NODES:  No palpable cervical, supraclavicular, axillary or inguinal adenopathy  NEUROLOGICAL:  Appropriate. PSYCH:  Appropriate.      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 No lymph invasion. Tumor cellularity 20%. [id number]. 07/13/2008: Left mastectomy: Residual ductal carcinoma in situ, high nuclear grade, deep margin negative ypTisNO(sn)MX. 10/25/2010: Abdominal and pelvic ultrasound: questionable lesion near gallbladder fossa recommend MRI follow up. 03/02/2011:  Left and right frontal excisional brain biopsy: Adenocarcinoma, metastatic, NOS: ER +, PR 5% +, Her 2 NEG. 03/04/2011: [facility] testing of IHC4 residual recurrence risk score of 114 showing an 8 year recurrence reate of 57%. ER POSTIVE, PR POSITIVE, HER 2 POSTIVE (previously documented negative in 2009) cutoff of 361 patient scores 426. ki67 71%.  08/05/2013: Right breast FNA supraclavicular region: positive for metastatic carcinoma. ER/PR/HER2 not reported. 08/19/2014: Right cervical lymph node. Adenocarcinoma with features consistent with metastatic breast carcinoma. ER/PR/HER2 insufficient tissue. 09/04/2014: Repeat craniotomy, resection of right frontal tumor (metastatic breast cancer). ER 90-100% PR 51-60% HER2 - 09/29/2014: Right cervical lymph node,  metastatic carcinoma c/w breast primary, ER 100% PR 20% HER2-  Imaging: 08/15/2007: Bilateral mammogram: Dense breasts with solid lesion at 6:00, 7:00, and 9:00 of right breast, solid lesion at 2:00 position left breast BIRADS 4B. 09/10/2007: Breast MRI: Three solid irregular enhancing lesions of the right breast 2.1 x 3.2, 2.8 x 2.7, and 1.8 x 1.6 cm. Mildly enlarged enhancing nodule right axilla. Left breast clumped enhancement midportion suspicious for malignancy. 03/01/2011: Brain MRI: At least two juxtacortical, intra-axial masses with extensive surrounding vasogenic edema most consistent with intracranial metastasis. 07/22/2013: Brain MRI +/- contrast: interval increase in enhancing component on T2 FLAIR now measuring 1.0cm x 1.1 cm worrisome for progression of disease. 01/21/2014:  PET scan: Hypermetabolic small right supraclavicular and right upper paratracheal lymph nodes suspicious for mets. Hypermetabolic lymph node in the right paratracheal upper mediastinum that measure approximately 0.9 x 0.5cm. (of note no impressions made of areas noted on brain MRI 07/22/13).  01/21/2014: Head MRI: Right frontal enhancing lesion has shown interval increase in size with surrounding edema and local mass effect (measured 1.8x2.1x1.6, previously 1.2x1.1x1.0) 07/16/2014: Head MRI:  Right anterior frontal enhancing mass 2.5 x 2.0 cm compatible with a metastatic lesion has increased and changed in configuration from previous 2.0 x 1.8 cm, formerly multilobular. 08/06/2014: PET scan:  Multiple bilateral FDG avid low cervical, supraclavicular, upper mediastinal, and right internal mammary lymphadenopathy, likely representing metastatic disease. A cervical node in the right lower neck should be amenable to percutaneous sampling.  12/2014: Head MRI:  Evolution of right frontal postoperative changes status post tumor resection at the vertex. Expected postoperative changes. Minimal marginal enhancement resection bed  with some adjacent posterior intrinsic T1 signal again seen. Decreasing T2/FLAIR adjacent parenchymal changes.  Stable resection cavity is left posterior frontal and right parieto-occipital regions. 04/20/2015 : PET scan: Cervical and thoracic nodal metastasis.  There was multifocal osseous metastasis (left transverse T4 process, left humeral head, right iliac wing, and left side of the sacrum).  Incidental findings included right nephrolithiasis.   04/22/2015: Bone scan: Corresponded with PET-CT with metastatic lesions evident in the left humeral head, left posterior aspect of T4, left aspect of S1, and the right iliac crest.  There was no other definite foci of metastatic disease to bone.  Uptake in the skull was most suggestive of the previous right frontal craniotomy. 05/24/2015: Bone density:  T-score of -0.8 in the right femoral neck (normal). 07/07/2015:  Head MRI:  Metastatic breast cancer with 3 resection cavities. The anterior right frontal cavity was positive for nodular marginal enhancement, new from brain MRI report 01/07/2015, and consistent with recurrent disease.  08/12/2015:  Lumbar Spine MRI: Osseous metastatic disease at L3, L4, L5, S1, and in the left iliac bone posteriorly. There was no definite epidural metastatic disease. 08/20/2015:  PET scan:  Response to therapy in the lower cervical and thoracic  adenopathy.  There was a mixed response to multifocal osseous metastases (some new lesions).  12/06/2015:  Head MRI:  Decreased nodular contrast enhancement at the right frontal lobe treatment site.  There was unchanged appearance of treatment sites within the bilateral parietal lobe size without residual or recurrent disease.  There were no new metastatic lesions. 02/18/2016: PET scan: Mixed response to therapy. There hadbeen improvement in right cervical lymphadenopathy and in several osseous lesions. There wereseveral other osseous lesions in the pelvis with increasing  hypermetabolism compared to the prior study. As the majority of the osseous lesions wereincreasingly sclerotic, some of this hypermetabolism could simply reflect bony healing. There was no new extraskeletal metastatic disease is noted in the neck, chest, abdomen or pelvis. 03/06/2016: Head MRI: Unchanged small focus of nodular enhancement at the right frontal resection site. There was unchanged appearance of the 2 other resection sites without abnormal enhancement. There was no evidence of new intracranial metastases. 05/23/2016:  Bone scan:  Stable focus of abnormal uptake seen in right frontal skull consistent with prior craniotomy.  There were areas of abnormal uptake identified in the left humeral head, T4 and S1 levels and right iliac crest that were present but decreased in intensity compared to prior exam suggesting improvement.  There was a new foci of abnormal uptake are noted in a right lower rib,T12 and L1 levels of spine concerning for metastatic disease. 06/07/2016:  Thoracic and lumbar spine MRI:  multiple low T1/T2 weighted signal, non enhancing lesions within the thoracic spine, which corresponded in size and location to sclerotic lesions demonstrated on the PET CT of 02/18/2016.  There were no new thoracic lesions.  There was minimal residual contrast enhancement within the L5 vertebral body metastatic lesion, decreased compared to the MRI of 08/12/2015.  There was minimal contrast enhancement at the periphery of the lesion within the L1 vertebral body. This lesion was new compared to MRI of 08/12/2015, but unchanged in size and location compared to PET CT of 02/18/2016.  There was no epidural disease, spinal canal stenosis or neural foraminal encroachment. 06/13/2016:  Head MRI revealed no evidence of residual or recurrent disease. 08/11/2016:  Chest, abdomen, and pelvic CT revealed  new 8 mm right supraclavicular, 12 mm right paratracheal and 10 mm subcarinal lymphadenopathy suspicious  for metastatic nodal recurrence.  There was extensive patchy sclerotic osseous metastases throughout the axial and proximal appendicular skeleton appear stable in size and distribution although generally increased in sclerosis since 02/18/2016 PET-CT, which was a nonspecific change that could reflect treatment effect or progression.  There was no additional sites of metastatic disease in the chest, abdomen or pelvis. 08/11/2016:  Bone scan revealed scattered foci of abnormal osseous tracer accumulation consistent with osseous metastatic disease with new sites of abnormal uptake in the thoracic spine at T5 in the posterior LEFT sixth rib. 08/31/2016:  Chest CT angiogram revealed interval confluent opacity in the posterior aspect of the right upper lobe suspicious for pneumonia. There was a small right-sided pleural effusion and adjacent right lower lobe atelectasis. 10/27/2016:  Head MRI revealed 9 new small brain metastases since 05/2016. The largest was a 10 mm metastasis in the posterior right cerebellum. The smallest lesions were punctate, and 1 of these along the posterior inferior left cerebellar hemisphere was suspicious for leptomeningeal based disease.  There was no associated edema or mass effect. 11/29/2016:  Chest, abdomen, and pelvic CT revealed multiple hepatic metastasis, a 10 mm adrenal metastasis, multiple abdominal nodes, 4.2 x 2.1 cm  pancreatic head mass and a separate 1.1 cm lesion in the anterior pancreatic body, new 9 mm paraesophageal lymph node, trace pericardial thickening/fluid, 2 focal lung nodules (10 mm and 5 mm) in the RUL, and an enlarging blastic lesion of T5.   Labs:  Appointment on 12/01/2016  Component Date Value Ref Range Status  . WBC 12/01/2016 11.5* 3.6 - 11.0 K/uL Final  . RBC 12/01/2016 3.59* 3.80 - 5.20 MIL/uL Final  . Hemoglobin 12/01/2016 10.7* 12.0 - 16.0 g/dL Final  . HCT 12/01/2016 31.6* 35.0 - 47.0 % Final  . MCV 12/01/2016 87.9  80.0 - 100.0 fL Final  .  MCH 12/01/2016 29.7  26.0 - 34.0 pg Final  . MCHC 12/01/2016 33.8  32.0 - 36.0 g/dL Final  . RDW 12/01/2016 17.7* 11.5 - 14.5 % Final  . Platelets 12/01/2016 269  150 - 440 K/uL Final  . Neutrophils Relative % 12/01/2016 87  % Final  . Neutro Abs 12/01/2016 10.0* 1.4 - 6.5 K/uL Final  . Lymphocytes Relative 12/01/2016 4  % Final  . Lymphs Abs 12/01/2016 0.5* 1.0 - 3.6 K/uL Final  . Monocytes Relative 12/01/2016 8  % Final  . Monocytes Absolute 12/01/2016 0.9  0.2 - 0.9 K/uL Final  . Eosinophils Relative 12/01/2016 1  % Final  . Eosinophils Absolute 12/01/2016 0.1  0 - 0.7 K/uL Final  . Basophils Relative 12/01/2016 0  % Final  . Basophils Absolute 12/01/2016 0.0  0 - 0.1 K/uL Final  . Sodium 12/01/2016 134* 135 - 145 mmol/L Final  . Potassium 12/01/2016 3.5  3.5 - 5.1 mmol/L Final  . Chloride 12/01/2016 101  101 - 111 mmol/L Final  . CO2 12/01/2016 25  22 - 32 mmol/L Final  . Glucose, Bld 12/01/2016 114* 65 - 99 mg/dL Final  . BUN 12/01/2016 13  6 - 20 mg/dL Final  . Creatinine, Ser 12/01/2016 0.60  0.44 - 1.00 mg/dL Final  . Calcium 12/01/2016 8.9  8.9 - 10.3 mg/dL Final  . Total Protein 12/01/2016 7.0  6.5 - 8.1 g/dL Final  . Albumin 12/01/2016 3.2* 3.5 - 5.0 g/dL Final  . AST 12/01/2016 60* 15 - 41 U/L Final  . ALT 12/01/2016 66* 14 - 54 U/L Final  . Alkaline Phosphatase 12/01/2016 169* 38 - 126 U/L Final  . Total Bilirubin 12/01/2016 1.0  0.3 - 1.2 mg/dL Final  . GFR calc non Af Amer 12/01/2016 >60  >60 mL/min Final  . GFR calc Af Amer 12/01/2016 >60  >60 mL/min Final   Comment: (NOTE) The eGFR has been calculated using the CKD EPI equation. This calculation has not been validated in all clinical situations. eGFR's persistently <60 mL/min signify possible Chronic Kidney Disease.   . Anion gap 12/01/2016 8  5 - 15 Final    Assessment:  Melinda Tanner is a 46 y.o. 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  She initially presented in 2009 while  living in [place] with multi-focal right breast cancer with positive lymph node(s) which was ER+, PR+(low), and HER2/neu+. She received neoadjuvant chemotherapy (AC x 4 every 3 weeks followed by Taxol/Abraxane + carboplatin weekly x 12) without anti-HER2 treatment. BRCA1/2 testing was negative on 08/15/2016.  On 07/14/2008, she underwent bilateral mastectomies followed by reconstruction.  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   Zero of 11 lymph nodes on the right were positive for malignancy. 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 Deep margin was negative. Zero of 2 lymph nodes were positive for metastatic disease.  She received adjuvant radiation to the right breast.  She received adjuvant tamoxifen and Zoladex.  She could not tolerate symptoms of joint pain and tamoxifen was discontinued after 1-2 months.  Zoladex was continued for approximately 1.5 years.  She was diagnosed with brain metastases in 02/2011.  Pathology revealed ER+, PR+(low), and HER2/neu-.  She underwent resection followed by [facility].  On 03/09/2011, original breast tissue (09/03/2007) sent to [facility] testing.  Testing revealed ER+, PR+(borderline), and HER2/neu positive (different than initial testing).  She received Herceptin for 1 year beginning in 2013.  She then began Tykerb and Xeloda after completion of Herceptin (2014).  She was on extremely low, and probably sub-therapeutic, dosing of lapatinib + capecitabine.   She developed right supraclavicular adenopathy in 2015. She was noted to have slow disease progression in the right frontal/parietal lesion with minimal presence of systemic disease on PET scans. Capecitabine + lapatinib were discontinued in 04/2014.   She underwent repeat craniotomy with resection of brain metastasis on 09/04/2014.  Right supraclavicular node biopsy on 08/23/2016 revealed metastatic adenocarcinoma morphologically consistent  with mammary carcinoma. Tumor was ER positive (> 90%) PR negative and HER-2/neu negative (1+ IHC).  She started tamoxifen in 09/2014, but discontinued it secondary to pain in hip, back, and shoulder.  She restarted tamoxifen on 04/23/2015.  Tamoxifen was discontinued on 08/23/2015 secondary to progressive disease.  She received Zoladex on 04/30/2015.  She underwent laparoscopic bilateral oophorectomy on 05/18/2015.  She receives monthly Xgeva (began 04/30/2015; last 11/13/2016).  She is post-menopausal.  She received Faslodex from 08/23/2015 - 04/04/2016.  She received 7 cycles of Ibrance (09/20/2015 - 06/18/2016).  Patient received palliative radiation 30 Gy to the left humerus and lumbar spine from 08/25/2015 - 09/09/2015.  She was not eligible for a clinical trial.  She completed palliative radiation to T12-L1 of 3000 cGy (06/21/2016 - 07/04/2016).  She received palliative radiation to the left hip from 08/17/2016 - 08/30/2016.    Xeloda was cost prohibitive.  She does not wish to pursue navelbine or gemcitabine.  She is s/p day 8 of cycle #2 Abraxane (10/09/2016 - 10/30/2016).  CA27.29 has been followed: 177.8 on 04/16/2015, 276.4 on 05/28/2015, 287.5 on 07/13/2015, 333.8 on 07/26/2015, 412.9 on 08/23/2015, 315.6 on 09/20/2015, 188.8 on 10/18/2015, 151.5 on 11/15/2015, 122.7 on 12/10/2015, 94.6 on 01/10/2016, 80.9 on 02/07/2016, 79 on 03/06/2016, 84.8 on 04/04/2016, 85.2 on 05/02/2016, 91.1 on 05/30/2016, 121 on 06/30/2016, 182.2 on 07/31/2016, 420.5 on 10/09/2016, 564.6 on 10/30/2016, and 1322.6 on 11/27/2016.  Head MRI on 06/13/2016 revealed no evidence of residual or recurrent disease.  Head MRI on 10/27/2016 revealed 9 new small brain metastases since 05/2016. The largest was a 10 mm metastasis in the posterior right cerebellum. The smallest lesions were punctate, and 1 of these along the posterior inferior left cerebellar hemisphere was suspicious for leptomeningeal based disease.  There was no  associated edema or mass effect.  She completed whole brain radiation (11/08/2016 - 11/24/2016).  She is on a steroid taper.  Bone scan on 08/11/2016 revealed scattered foci of abnormal osseous tracer accumulation consistent with osseous metastatic disease with new sites of abnormal uptake in the thoracic spine at T5 in the posterior LEFT sixth rib.  Chest, abdomen, and pelvic CT on 11/29/2016 revealed multiple hepatic metastasis, a 10 mm adrenal metastasis, multiple abdominal nodes, 4.2 x 2.1 cm pancreatic head mass and a separate 1.1 cm  lesion in the anterior pancreatic body, new 9 mm paraesophageal lymph node, trace pericardial thickening/fluid, 2 focal lung nodules (10 mm and 5 mm) in the RUL, and an enlarging blastic lesion of T5.  Bone density study on 05/24/2015 revealed a T-score of -0.8 in the right femoral neck (normal).  She is on a Fentanyl 25 mcg/hr patch.  Pain is well controlled.  Code status is FULL CODE.  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  Work-up on 10/25/2015 revealed a B12 of 141 (low).  B12 was 205 on 12/13/2015 with an MMA of 171 (normal) on 01/10/2016.  Ferritin was 43. Iron studies included a saturation of 13% and a TIBC of 354. TSH and folate were normal.  Reticulocyte count was 2.2%.  She declined B12 injections.  She started oral B12 1000 mcg on 11/05/2015.  She takes her B12 sporadically.  B12 level was 205 (low normal) on 12/13/2015.  Diet is good.  She denies any melena or hematochezia.  Symptomatically, she denies any pain.  LFTs remain elevated; (AST 60, ALT 66, and alkaline phosphatase 169).  Exam is stable.  Plan: 1.  Labs today: CBC with diff, CMP, CA19-9. 2.  Review imaging studies- rapidly progressive disease.  Discuss metastatic lesions.  Likely metastatic disease to pancreas.  Review discussions at tumor board.  No plan for repeat biopsy.  Discuss following symptoms, LFTs, tumor marker and scans closely.   Discuss continuation of Abraxane. 3.  Discuss concerns with percardial involvement.  No intervention required at this time beyond her current chemotherapy. Discuss possible future pericardiocentesis or a pericardial window in the future. 4.  Discuss code status with patient today. Patient wishes to remain full code at this time, but is realistic if her disease progresses and is unresponsive to treatment that she does not wish heroic measures.  Code status confirmed as FULL CODE. 5.  Day 1 of cycle #3 Abraxane. 6.  RTC on 12/08/2016 for labs (CBC with diff, CMP), and day 8 of cycle #3 Abraxane. 7.  RTC on 12/15/2016 for labs (CBC with diff, CMP), and day 15 of cycle #3 Abraxane. 8.  RTC on 12/29/2016 for MD assessment, labs (CBC with diff, CMP, CA27.29), and day 1 of cycle #4 of Abraxane.  Honor Loh, NP 12/01/2016, 11:18 AM   I saw and evaluated the patient, participating in the key portions of the service and reviewing pertinent diagnostic studies and records.  I reviewed the nurse practitioner's note and agree with the findings and the plan.  The assessment and plan were discussed with the patient.  Multiple questions were asked by the patient and answered.  More than 40 minutes were spent with the patient and her husband.   Lequita Asal, MD 12/01/2016, 2:58 PM

## 2016-12-01 NOTE — Progress Notes (Signed)
Patient here for results and follow up. She has been having mild stomach distress for the past few weeks. Experiences gagging in the morning , no vomiting.

## 2016-12-02 DIAGNOSIS — C7970 Secondary malignant neoplasm of unspecified adrenal gland: Secondary | ICD-10-CM

## 2016-12-02 DIAGNOSIS — C50912 Malignant neoplasm of unspecified site of left female breast: Secondary | ICD-10-CM | POA: Insufficient documentation

## 2016-12-02 DIAGNOSIS — C7889 Secondary malignant neoplasm of other digestive organs: Secondary | ICD-10-CM | POA: Insufficient documentation

## 2016-12-02 DIAGNOSIS — C787 Secondary malignant neoplasm of liver and intrahepatic bile duct: Secondary | ICD-10-CM | POA: Insufficient documentation

## 2016-12-02 DIAGNOSIS — C7801 Secondary malignant neoplasm of right lung: Secondary | ICD-10-CM | POA: Insufficient documentation

## 2016-12-02 LAB — CANCER ANTIGEN 19-9: CA 19-9: 110 U/mL — ABNORMAL HIGH (ref 0–35)

## 2016-12-02 LAB — HEPATITIS B CORE ANTIBODY, TOTAL: Hep B Core Total Ab: NEGATIVE

## 2016-12-02 LAB — HEPATITIS C ANTIBODY (REFLEX): HCV Ab: 0.1 s/co ratio (ref 0.0–0.9)

## 2016-12-02 LAB — HCV COMMENT:

## 2016-12-02 LAB — HEPATITIS B SURFACE ANTIGEN: Hepatitis B Surface Ag: NEGATIVE

## 2016-12-08 ENCOUNTER — Other Ambulatory Visit: Payer: Self-pay | Admitting: Hematology and Oncology

## 2016-12-08 ENCOUNTER — Inpatient Hospital Stay: Payer: Medicare HMO

## 2016-12-08 DIAGNOSIS — Z170 Estrogen receptor positive status [ER+]: Secondary | ICD-10-CM | POA: Diagnosis not present

## 2016-12-08 DIAGNOSIS — D0512 Intraductal carcinoma in situ of left breast: Secondary | ICD-10-CM | POA: Diagnosis not present

## 2016-12-08 DIAGNOSIS — Z5111 Encounter for antineoplastic chemotherapy: Secondary | ICD-10-CM | POA: Diagnosis not present

## 2016-12-08 DIAGNOSIS — C50811 Malignant neoplasm of overlapping sites of right female breast: Secondary | ICD-10-CM | POA: Diagnosis not present

## 2016-12-08 DIAGNOSIS — C50919 Malignant neoplasm of unspecified site of unspecified female breast: Secondary | ICD-10-CM

## 2016-12-08 DIAGNOSIS — Z803 Family history of malignant neoplasm of breast: Secondary | ICD-10-CM

## 2016-12-08 DIAGNOSIS — Z923 Personal history of irradiation: Secondary | ICD-10-CM | POA: Diagnosis not present

## 2016-12-08 DIAGNOSIS — C787 Secondary malignant neoplasm of liver and intrahepatic bile duct: Secondary | ICD-10-CM | POA: Diagnosis not present

## 2016-12-08 DIAGNOSIS — C7931 Secondary malignant neoplasm of brain: Secondary | ICD-10-CM | POA: Diagnosis not present

## 2016-12-08 DIAGNOSIS — C7951 Secondary malignant neoplasm of bone: Secondary | ICD-10-CM | POA: Diagnosis not present

## 2016-12-08 DIAGNOSIS — C7970 Secondary malignant neoplasm of unspecified adrenal gland: Secondary | ICD-10-CM | POA: Diagnosis not present

## 2016-12-08 DIAGNOSIS — C770 Secondary and unspecified malignant neoplasm of lymph nodes of head, face and neck: Secondary | ICD-10-CM | POA: Diagnosis not present

## 2016-12-08 LAB — COMPREHENSIVE METABOLIC PANEL
ALT: 51 U/L (ref 14–54)
AST: 63 U/L — ABNORMAL HIGH (ref 15–41)
Albumin: 3.2 g/dL — ABNORMAL LOW (ref 3.5–5.0)
Alkaline Phosphatase: 208 U/L — ABNORMAL HIGH (ref 38–126)
Anion gap: 8 (ref 5–15)
BUN: 12 mg/dL (ref 6–20)
CO2: 28 mmol/L (ref 22–32)
Calcium: 9.4 mg/dL (ref 8.9–10.3)
Chloride: 97 mmol/L — ABNORMAL LOW (ref 101–111)
Creatinine, Ser: 0.59 mg/dL (ref 0.44–1.00)
GFR calc Af Amer: >60 mL/min (ref >60)
GFR calc non Af Amer: >60 mL/min (ref >60)
Glucose, Bld: 124 mg/dL — ABNORMAL HIGH (ref 65–99)
Potassium: 3.9 mmol/L (ref 3.5–5.1)
Sodium: 133 mmol/L — ABNORMAL LOW (ref 135–145)
Total Bilirubin: 0.5 mg/dL (ref 0.3–1.2)
Total Protein: 7 g/dL (ref 6.5–8.1)

## 2016-12-08 LAB — CBC WITH DIFFERENTIAL/PLATELET
Basophils Absolute: 0 10*3/uL (ref 0–0.1)
Basophils Relative: 1 %
Eosinophils Absolute: 0.1 10*3/uL (ref 0–0.7)
Eosinophils Relative: 2 %
HCT: 30.9 % — ABNORMAL LOW (ref 35.0–47.0)
Hemoglobin: 10.6 g/dL — ABNORMAL LOW (ref 12.0–16.0)
Lymphocytes Relative: 7 %
Lymphs Abs: 0.3 10*3/uL — ABNORMAL LOW (ref 1.0–3.6)
MCH: 30 pg (ref 26.0–34.0)
MCHC: 34.3 g/dL (ref 32.0–36.0)
MCV: 87.5 fL (ref 80.0–100.0)
Monocytes Absolute: 0.3 10*3/uL (ref 0.2–0.9)
Monocytes Relative: 7 %
Neutro Abs: 4 10*3/uL (ref 1.4–6.5)
Neutrophils Relative %: 83 %
Platelets: 351 10*3/uL (ref 150–440)
RBC: 3.53 MIL/uL — ABNORMAL LOW (ref 3.80–5.20)
RDW: 17.7 % — ABNORMAL HIGH (ref 11.5–14.5)
WBC: 4.8 10*3/uL (ref 3.6–11.0)

## 2016-12-08 MED ORDER — HEPARIN SOD (PORK) LOCK FLUSH 100 UNIT/ML IV SOLN
500.0000 [IU] | Freq: Once | INTRAVENOUS | Status: AC
  Filled 2016-12-08: qty 5

## 2016-12-08 MED ORDER — PACLITAXEL PROTEIN-BOUND CHEMO INJECTION 100 MG
100.0000 mg/m2 | Freq: Once | INTRAVENOUS | Status: AC
Start: 2016-12-08 — End: 2016-12-08
  Administered 2016-12-08: 175 mg via INTRAVENOUS
  Filled 2016-12-08: qty 35

## 2016-12-08 MED ORDER — HEPARIN SOD (PORK) LOCK FLUSH 100 UNIT/ML IV SOLN
500.0000 [IU] | Freq: Once | INTRAVENOUS | Status: AC | PRN
  Administered 2016-12-08: 500 [IU]

## 2016-12-08 MED ORDER — SODIUM CHLORIDE 0.9% FLUSH
10.0000 mL | Freq: Once | INTRAVENOUS | Status: AC
  Administered 2016-12-08: 10 mL via INTRAVENOUS
  Filled 2016-12-08: qty 10

## 2016-12-08 MED ORDER — SODIUM CHLORIDE 0.9 % IV SOLN
Freq: Once | INTRAVENOUS | Status: AC
  Administered 2016-12-08: 10:00:00 via INTRAVENOUS
  Filled 2016-12-08: qty 1000

## 2016-12-08 MED ORDER — PROMETHAZINE HCL 25 MG PO TABS
25.0000 mg | ORAL_TABLET | Freq: Four times a day (QID) | ORAL | Status: DC | PRN
  Administered 2016-12-08: 25 mg via ORAL
  Filled 2016-12-08: qty 1

## 2016-12-15 ENCOUNTER — Inpatient Hospital Stay: Payer: Medicare HMO

## 2016-12-15 ENCOUNTER — Other Ambulatory Visit: Payer: Self-pay | Admitting: Hematology and Oncology

## 2016-12-15 ENCOUNTER — Other Ambulatory Visit: Payer: Self-pay | Admitting: Urgent Care

## 2016-12-15 DIAGNOSIS — C7931 Secondary malignant neoplasm of brain: Secondary | ICD-10-CM | POA: Diagnosis not present

## 2016-12-15 DIAGNOSIS — Z803 Family history of malignant neoplasm of breast: Secondary | ICD-10-CM

## 2016-12-15 DIAGNOSIS — C50811 Malignant neoplasm of overlapping sites of right female breast: Secondary | ICD-10-CM | POA: Diagnosis not present

## 2016-12-15 DIAGNOSIS — C7970 Secondary malignant neoplasm of unspecified adrenal gland: Secondary | ICD-10-CM | POA: Diagnosis not present

## 2016-12-15 DIAGNOSIS — C7951 Secondary malignant neoplasm of bone: Secondary | ICD-10-CM | POA: Diagnosis not present

## 2016-12-15 DIAGNOSIS — R7989 Other specified abnormal findings of blood chemistry: Secondary | ICD-10-CM

## 2016-12-15 DIAGNOSIS — C787 Secondary malignant neoplasm of liver and intrahepatic bile duct: Secondary | ICD-10-CM | POA: Diagnosis not present

## 2016-12-15 DIAGNOSIS — Z170 Estrogen receptor positive status [ER+]: Secondary | ICD-10-CM | POA: Diagnosis not present

## 2016-12-15 DIAGNOSIS — R945 Abnormal results of liver function studies: Principal | ICD-10-CM

## 2016-12-15 DIAGNOSIS — D0512 Intraductal carcinoma in situ of left breast: Secondary | ICD-10-CM | POA: Diagnosis not present

## 2016-12-15 DIAGNOSIS — C770 Secondary and unspecified malignant neoplasm of lymph nodes of head, face and neck: Secondary | ICD-10-CM | POA: Diagnosis not present

## 2016-12-15 DIAGNOSIS — Z5111 Encounter for antineoplastic chemotherapy: Secondary | ICD-10-CM | POA: Diagnosis not present

## 2016-12-15 DIAGNOSIS — Z923 Personal history of irradiation: Secondary | ICD-10-CM | POA: Diagnosis not present

## 2016-12-15 LAB — COMPREHENSIVE METABOLIC PANEL
ALT: 95 U/L — ABNORMAL HIGH (ref 14–54)
AST: 120 U/L — ABNORMAL HIGH (ref 15–41)
Albumin: 3.2 g/dL — ABNORMAL LOW (ref 3.5–5.0)
Alkaline Phosphatase: 299 U/L — ABNORMAL HIGH (ref 38–126)
Anion gap: 7 (ref 5–15)
BUN: 7 mg/dL (ref 6–20)
CO2: 25 mmol/L (ref 22–32)
Calcium: 8.5 mg/dL — ABNORMAL LOW (ref 8.9–10.3)
Chloride: 104 mmol/L (ref 101–111)
Creatinine, Ser: 0.57 mg/dL (ref 0.44–1.00)
GFR calc Af Amer: >60 mL/min (ref >60)
GFR calc non Af Amer: >60 mL/min (ref >60)
Glucose, Bld: 95 mg/dL (ref 65–99)
Potassium: 3.7 mmol/L (ref 3.5–5.1)
Sodium: 136 mmol/L (ref 135–145)
Total Bilirubin: 0.7 mg/dL (ref 0.3–1.2)
Total Protein: 6.9 g/dL (ref 6.5–8.1)

## 2016-12-15 LAB — CBC WITH DIFFERENTIAL/PLATELET
Basophils Absolute: 0 10*3/uL (ref 0–0.1)
Basophils Relative: 1 %
Eosinophils Absolute: 0 10*3/uL (ref 0–0.7)
Eosinophils Relative: 1 %
HCT: 30.8 % — ABNORMAL LOW (ref 35.0–47.0)
Hemoglobin: 10.5 g/dL — ABNORMAL LOW (ref 12.0–16.0)
Lymphocytes Relative: 8 %
Lymphs Abs: 0.3 10*3/uL — ABNORMAL LOW (ref 1.0–3.6)
MCH: 29.8 pg (ref 26.0–34.0)
MCHC: 34 g/dL (ref 32.0–36.0)
MCV: 87.6 fL (ref 80.0–100.0)
Monocytes Absolute: 0.3 10*3/uL (ref 0.2–0.9)
Monocytes Relative: 8 %
Neutro Abs: 3.2 10*3/uL (ref 1.4–6.5)
Neutrophils Relative %: 82 %
Platelets: 390 10*3/uL (ref 150–440)
RBC: 3.51 MIL/uL — ABNORMAL LOW (ref 3.80–5.20)
RDW: 17.5 % — ABNORMAL HIGH (ref 11.5–14.5)
WBC: 3.9 10*3/uL (ref 3.6–11.0)

## 2016-12-15 MED ORDER — HEPARIN SOD (PORK) LOCK FLUSH 100 UNIT/ML IV SOLN
500.0000 [IU] | Freq: Once | INTRAVENOUS | Status: AC
  Administered 2016-12-15: 500 [IU] via INTRAVENOUS
  Filled 2016-12-15: qty 5

## 2016-12-15 MED ORDER — SODIUM CHLORIDE 0.9% FLUSH
10.0000 mL | INTRAVENOUS | Status: DC | PRN
  Filled 2016-12-15: qty 10

## 2016-12-20 ENCOUNTER — Inpatient Hospital Stay: Payer: Medicare HMO | Attending: Hematology and Oncology

## 2016-12-20 DIAGNOSIS — R630 Anorexia: Secondary | ICD-10-CM | POA: Insufficient documentation

## 2016-12-20 DIAGNOSIS — D72819 Decreased white blood cell count, unspecified: Secondary | ICD-10-CM | POA: Diagnosis not present

## 2016-12-20 DIAGNOSIS — E876 Hypokalemia: Secondary | ICD-10-CM | POA: Diagnosis not present

## 2016-12-20 DIAGNOSIS — R740 Nonspecific elevation of levels of transaminase and lactic acid dehydrogenase [LDH]: Secondary | ICD-10-CM | POA: Insufficient documentation

## 2016-12-20 DIAGNOSIS — Z780 Asymptomatic menopausal state: Secondary | ICD-10-CM | POA: Insufficient documentation

## 2016-12-20 DIAGNOSIS — Z79899 Other long term (current) drug therapy: Secondary | ICD-10-CM | POA: Insufficient documentation

## 2016-12-20 DIAGNOSIS — Z803 Family history of malignant neoplasm of breast: Secondary | ICD-10-CM | POA: Insufficient documentation

## 2016-12-20 DIAGNOSIS — C7931 Secondary malignant neoplasm of brain: Secondary | ICD-10-CM | POA: Diagnosis not present

## 2016-12-20 DIAGNOSIS — C787 Secondary malignant neoplasm of liver and intrahepatic bile duct: Secondary | ICD-10-CM | POA: Diagnosis not present

## 2016-12-20 DIAGNOSIS — C7951 Secondary malignant neoplasm of bone: Secondary | ICD-10-CM | POA: Insufficient documentation

## 2016-12-20 DIAGNOSIS — C50911 Malignant neoplasm of unspecified site of right female breast: Secondary | ICD-10-CM | POA: Insufficient documentation

## 2016-12-20 DIAGNOSIS — Z170 Estrogen receptor positive status [ER+]: Secondary | ICD-10-CM | POA: Diagnosis not present

## 2016-12-20 DIAGNOSIS — R945 Abnormal results of liver function studies: Secondary | ICD-10-CM

## 2016-12-20 DIAGNOSIS — R5383 Other fatigue: Secondary | ICD-10-CM | POA: Insufficient documentation

## 2016-12-20 DIAGNOSIS — Z923 Personal history of irradiation: Secondary | ICD-10-CM | POA: Insufficient documentation

## 2016-12-20 DIAGNOSIS — R531 Weakness: Secondary | ICD-10-CM | POA: Insufficient documentation

## 2016-12-20 DIAGNOSIS — Z7689 Persons encountering health services in other specified circumstances: Secondary | ICD-10-CM | POA: Insufficient documentation

## 2016-12-20 DIAGNOSIS — C7970 Secondary malignant neoplasm of unspecified adrenal gland: Secondary | ICD-10-CM | POA: Insufficient documentation

## 2016-12-20 DIAGNOSIS — Z8042 Family history of malignant neoplasm of prostate: Secondary | ICD-10-CM | POA: Insufficient documentation

## 2016-12-20 DIAGNOSIS — Z5111 Encounter for antineoplastic chemotherapy: Secondary | ICD-10-CM | POA: Diagnosis present

## 2016-12-20 DIAGNOSIS — R918 Other nonspecific abnormal finding of lung field: Secondary | ICD-10-CM | POA: Insufficient documentation

## 2016-12-20 DIAGNOSIS — E538 Deficiency of other specified B group vitamins: Secondary | ICD-10-CM | POA: Insufficient documentation

## 2016-12-20 DIAGNOSIS — R000 Tachycardia, unspecified: Secondary | ICD-10-CM | POA: Insufficient documentation

## 2016-12-20 DIAGNOSIS — Z9013 Acquired absence of bilateral breasts and nipples: Secondary | ICD-10-CM | POA: Diagnosis not present

## 2016-12-20 DIAGNOSIS — R42 Dizziness and giddiness: Secondary | ICD-10-CM | POA: Insufficient documentation

## 2016-12-20 DIAGNOSIS — J90 Pleural effusion, not elsewhere classified: Secondary | ICD-10-CM | POA: Insufficient documentation

## 2016-12-20 DIAGNOSIS — K869 Disease of pancreas, unspecified: Secondary | ICD-10-CM | POA: Insufficient documentation

## 2016-12-20 DIAGNOSIS — Z880 Allergy status to penicillin: Secondary | ICD-10-CM | POA: Insufficient documentation

## 2016-12-20 DIAGNOSIS — D649 Anemia, unspecified: Secondary | ICD-10-CM | POA: Insufficient documentation

## 2016-12-20 DIAGNOSIS — Z806 Family history of leukemia: Secondary | ICD-10-CM | POA: Insufficient documentation

## 2016-12-20 DIAGNOSIS — R634 Abnormal weight loss: Secondary | ICD-10-CM | POA: Insufficient documentation

## 2016-12-20 DIAGNOSIS — R112 Nausea with vomiting, unspecified: Secondary | ICD-10-CM | POA: Insufficient documentation

## 2016-12-20 DIAGNOSIS — Z452 Encounter for adjustment and management of vascular access device: Secondary | ICD-10-CM | POA: Diagnosis not present

## 2016-12-20 DIAGNOSIS — R7989 Other specified abnormal findings of blood chemistry: Secondary | ICD-10-CM

## 2016-12-20 LAB — HEPATIC FUNCTION PANEL
ALBUMIN: 3.5 g/dL (ref 3.5–5.0)
ALT: 106 U/L — ABNORMAL HIGH (ref 14–54)
AST: 130 U/L — AB (ref 15–41)
Alkaline Phosphatase: 361 U/L — ABNORMAL HIGH (ref 38–126)
BILIRUBIN DIRECT: 0.2 mg/dL (ref 0.1–0.5)
Indirect Bilirubin: 0.5 mg/dL (ref 0.3–0.9)
TOTAL PROTEIN: 7.4 g/dL (ref 6.5–8.1)
Total Bilirubin: 0.7 mg/dL (ref 0.3–1.2)

## 2016-12-21 ENCOUNTER — Encounter: Payer: Self-pay | Admitting: Hematology and Oncology

## 2016-12-21 ENCOUNTER — Inpatient Hospital Stay (HOSPITAL_BASED_OUTPATIENT_CLINIC_OR_DEPARTMENT_OTHER): Payer: Medicare HMO | Admitting: Hematology and Oncology

## 2016-12-21 ENCOUNTER — Other Ambulatory Visit: Payer: Medicare HMO

## 2016-12-21 ENCOUNTER — Other Ambulatory Visit: Payer: Self-pay | Admitting: *Deleted

## 2016-12-21 VITALS — BP 114/78 | HR 125 | Temp 98.2°F | Resp 18 | Wt 142.2 lb

## 2016-12-21 DIAGNOSIS — C7951 Secondary malignant neoplasm of bone: Secondary | ICD-10-CM

## 2016-12-21 DIAGNOSIS — Z5111 Encounter for antineoplastic chemotherapy: Secondary | ICD-10-CM | POA: Diagnosis not present

## 2016-12-21 DIAGNOSIS — Z9013 Acquired absence of bilateral breasts and nipples: Secondary | ICD-10-CM

## 2016-12-21 DIAGNOSIS — C7931 Secondary malignant neoplasm of brain: Secondary | ICD-10-CM | POA: Diagnosis not present

## 2016-12-21 DIAGNOSIS — C787 Secondary malignant neoplasm of liver and intrahepatic bile duct: Secondary | ICD-10-CM | POA: Diagnosis not present

## 2016-12-21 DIAGNOSIS — K869 Disease of pancreas, unspecified: Secondary | ICD-10-CM | POA: Diagnosis not present

## 2016-12-21 DIAGNOSIS — C50911 Malignant neoplasm of unspecified site of right female breast: Secondary | ICD-10-CM | POA: Diagnosis not present

## 2016-12-21 DIAGNOSIS — Z170 Estrogen receptor positive status [ER+]: Secondary | ICD-10-CM | POA: Diagnosis not present

## 2016-12-21 DIAGNOSIS — D649 Anemia, unspecified: Secondary | ICD-10-CM

## 2016-12-21 DIAGNOSIS — E538 Deficiency of other specified B group vitamins: Secondary | ICD-10-CM | POA: Diagnosis not present

## 2016-12-21 DIAGNOSIS — C50919 Malignant neoplasm of unspecified site of unspecified female breast: Secondary | ICD-10-CM

## 2016-12-21 DIAGNOSIS — Z923 Personal history of irradiation: Secondary | ICD-10-CM

## 2016-12-21 DIAGNOSIS — R918 Other nonspecific abnormal finding of lung field: Secondary | ICD-10-CM | POA: Diagnosis not present

## 2016-12-21 DIAGNOSIS — C7801 Secondary malignant neoplasm of right lung: Secondary | ICD-10-CM

## 2016-12-21 DIAGNOSIS — C7889 Secondary malignant neoplasm of other digestive organs: Secondary | ICD-10-CM

## 2016-12-21 DIAGNOSIS — Z9221 Personal history of antineoplastic chemotherapy: Secondary | ICD-10-CM | POA: Diagnosis not present

## 2016-12-21 DIAGNOSIS — C50912 Malignant neoplasm of unspecified site of left female breast: Secondary | ICD-10-CM

## 2016-12-21 DIAGNOSIS — C7970 Secondary malignant neoplasm of unspecified adrenal gland: Secondary | ICD-10-CM

## 2016-12-21 DIAGNOSIS — Z853 Personal history of malignant neoplasm of breast: Secondary | ICD-10-CM

## 2016-12-21 DIAGNOSIS — Z7189 Other specified counseling: Secondary | ICD-10-CM

## 2016-12-21 DIAGNOSIS — R740 Nonspecific elevation of levels of transaminase and lactic acid dehydrogenase [LDH]: Secondary | ICD-10-CM | POA: Diagnosis not present

## 2016-12-21 DIAGNOSIS — E876 Hypokalemia: Secondary | ICD-10-CM | POA: Diagnosis not present

## 2016-12-21 NOTE — Progress Notes (Signed)
[facility] day:  12/21/2016   Chief Complaint: Melinda Tanner is a 46 y.o. female with metastatic breast cancer with brain metastasis who is seen for assessment during cycle #3 Abraxane.   HPI:  The patient was last seen in the medical oncology clinic on 12/01/2016.  At that time she denied any pain.  LFTs were elevated; (AST 60, ALT 66, and alkaline phosphatase 169).  Exam was stable.  She began cycle #3 Abraxane.  She received Abraxane on 09/14 and 12/08/2016.  Labs on 12/08/2016 revealed an SGOT of 63, SGPT 51, and alkaline phosphatase of 208.  Labs on 12/15/2016 revealed an SGOT of 120, SGPT 95, and alkaline phosphatase of 299.  Abraxane was held.  LFTs yesterday included an SGOT of 130, SGPT 106, and an alkaline phosphatase of 361.  During the interim, she has had abdominal discomfort.  She denies any reflux.  She notes an episode of emesis after cough.  She is having a bowel movement every day.  She denies any constipation or diarrhea.  She has not needed any pain medications.    Past Medical History:  Diagnosis Date  . Brain cancer ([place]) 2012   Met. from Breast  . Breast cancer ([place]) 2009  . Complication of anesthesia    nausea, "drops in potassium and magnesium"  . Seizures ([place])     Past Surgical History:  Procedure Laterality Date  . BRAIN SURGERY  2012, 2106  . CESAREAN SECTION    . LAPAROSCOPIC BILATERAL SALPINGO OOPHERECTOMY Bilateral 05/18/2015   Procedure: LAPAROSCOPIC BILATERAL SALPINGO OOPHORECTOMY;  Surgeon: Will Bonnet, MD;  Location: [facility] ORS;  Service: Gynecology;  Laterality: Bilateral;  . MASTECTOMY Bilateral 2010    Family History  Problem Relation Age of Onset  . Cancer Paternal Aunt   . Cancer Paternal Uncle   . Cancer Paternal Grandfather   . Hypertension Brother   . Diabetes Paternal Grandmother   A paternal aunt had breast cancer age 72, a paternal uncle had prostate cancer, and a paternal  grandfather had leukemia.  Social History:  reports that she has never smoked. She has never used smokeless tobacco. She reports that she does not drink alcohol or use drugs.  She is from [place].  She moved to [place] in 2015.  She moved to [place] in 04/2014.  She recently moved into the [place] area.  She has 2 children (boy and girl).  Her family will likely be moving to [facility] in December.  They are looking for a home.  She is going on a trip to [place] 10/18/2016 - 10/24/2016.  The patient is accompanied by the Spanish interpreter today.  Allergies:  Allergies  Allergen Reactions  . Taxol [Paclitaxel] Anaphylaxis  . Aspirin Swelling  . Penicillins Swelling  . Zofran [Ondansetron Hcl] Nausea And Vomiting    Current Medications: Current Outpatient Prescriptions  Medication Sig Dispense Refill  . CALCIUM-VITAMIN D PO Take 1 tablet by mouth daily.    . capecitabine (XELODA) 150 MG tablet     . Cyanocobalamin (VITAMIN B-12 PO) Take by mouth.    . dexamethasone (DECADRON) 4 MG tablet Take 1 tablet (4 mg total) by mouth daily. 25 tablet 0  . docusate sodium (COLACE) 100 MG capsule Take 1 tablet once or twice daily as needed for constipation while taking narcotic pain medicine 30 capsule 0  . ibuprofen (ADVIL,MOTRIN) 800 MG tablet Take 800 mg by mouth every 8 (eight)  hours as needed (Takes 1/2 tablet prn).    . Multiple Vitamins-Minerals (CENTRUM ADULTS PO) Take 1 tablet by mouth daily.    Marland Kitchen omeprazole (PRILOSEC) 20 MG capsule Take 20 mg by mouth daily.  2  . pantoprazole (PROTONIX) 20 MG tablet Take 1 tablet by mouth daily 30 tablet 3  . promethazine (PHENERGAN) 25 MG tablet Take 1 tablet (25 mg total) by mouth every 6 (six) hours as needed for nausea or vomiting. 30 tablet 2  . traMADol (ULTRAM) 50 MG tablet Take 50 mg by mouth every 12 (twelve) hours as needed.    . potassium chloride SA (K-DUR,KLOR-CON) 20 MEQ tablet Take 1 tablet (20 mEq total) by mouth 2 (two)  times daily. for 1 days then 1 pill a day x 2 days. (Patient not taking: Reported on 12/21/2016) 10 tablet 0   No current facility-administered medications for this visit.     Review of Systems:  GENERAL:  Feels "ok".  No fevers or sweats.  Weight down 5 pounds since last appointment. PERFORMANCE STATUS (ECOG):  1 HEENT:  No visual changes, runny nose, sore throat, mouth sores or tenderness.  Excess saliva. Lungs: No shortness of breath.  Cough.  No hemoptysis. Cardiac:  No chest pain, palpitations, orthopnea, or PND. GI:  Abdominal discomfort.  Emesis after cough x 1.  No vomiting, diarrhea, constipation, melena or hematochezia.  GU:  No urgency, frequency, dysuria or hematuria. Musculoskeletal: No pain.  No muscle tenderness. Extremities:  No pain or swelling. Skin:  No rashes or irritation. Neuro:  No headache, numbness or weakness, balance or coordination issues.  Endocrine:  No diabetes, thyroid issues, or night sweats.  Hot flashes. Psych:  No mood changes, depression or anxiety. Pain:  No pain pills since 10/13/2016. Review of systems:  All other systems reviewed and found to be negative.  Physical Exam: Blood pressure 114/78, pulse (!) 125, temperature 98.2 F (36.8 C), temperature source Tympanic, resp. rate 18, weight 142 lb 4 oz (64.5 kg), last menstrual period 03/19/2015. GENERAL:  Well developed, well nourished, woman sitting comfortably in the exam room in no acute distress. MENTAL STATUS:  Alert and oriented to person, place and time. HEAD: Wearing a black cap.  Normocephalic, atraumatic, face symmetric, no Cushingoid features. EYES:  Brown eyes. Pupils equal round and reactive to light and accomodation.  No conjunctivitis or scleral icterus. ENT:  Oropharynx clear without lesion.  Tongue normal. Mucous membranes moist.  RESPIRATORY:  Decreased breath sounds right base (chronic).  Clear to auscultation without rales, wheezes or rhonchi. CARDIOVASCULAR:  Regular rate and  rhythm without murmur, rub or gallop. ABDOMEN:  Soft, non-tender, with active bowel sounds, and no hepatosplenomegaly.  No masses. SKIN:  No rashes, bruises or ulcers. EXTREMITIES:  No edema, no skin discoloration or tenderness.  No palpable cords. LYMPH NODES:  No palpable cervical, supraclavicular, axillary or inguinal adenopathy  NEUROLOGICAL:  Appropriate. PSYCH:  Appropriate.      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 No lymph invasion. Tumor cellularity 20%. [id number]. 07/13/2008: Left mastectomy: Residual ductal carcinoma in situ, high nuclear grade, deep margin negative ypTisNO(sn)MX. 10/25/2010: Abdominal and pelvic ultrasound: questionable lesion near gallbladder fossa recommend MRI follow up.  03/02/2011: Left and right frontal excisional brain biopsy: Adenocarcinoma, metastatic, NOS: ER +, PR 5% +, Her 2 NEG. 03/04/2011: [facility] testing of IHC4 residual recurrence risk score of 114 showing an 8 year recurrence reate of 57%. ER POSTIVE, PR POSITIVE, HER 2 POSTIVE (previously documented negative in 2009) cutoff of 361 patient scores 426. ki67 71%.  08/05/2013: Right breast FNA supraclavicular region: positive for metastatic carcinoma. ER/PR/HER2 not reported. 08/19/2014: Right cervical lymph node. Adenocarcinoma with features consistent with metastatic breast carcinoma. ER/PR/HER2 insufficient tissue. 09/04/2014: Repeat craniotomy, resection of right frontal tumor (metastatic breast cancer). ER 90-100% PR 51-60% HER2 - 09/29/2014: Right cervical lymph node, metastatic carcinoma c/w breast primary, ER 100% PR 20%  HER2-  Imaging: 08/15/2007: Bilateral mammogram: Dense breasts with solid lesion at 6:00, 7:00, and 9:00 of right breast, solid lesion at 2:00 position left breast BIRADS 4B. 09/10/2007: Breast MRI: Three solid irregular enhancing lesions of the right breast 2.1 x 3.2, 2.8 x 2.7, and 1.8 x 1.6 cm. Mildly enlarged enhancing nodule right axilla. Left breast clumped enhancement midportion suspicious for malignancy. 03/01/2011: Brain MRI: At least two juxtacortical, intra-axial masses with extensive surrounding vasogenic edema most consistent with intracranial metastasis. 07/22/2013: Brain MRI +/- contrast: interval increase in enhancing component on T2 FLAIR now measuring 1.0cm x 1.1 cm worrisome for progression of disease. 01/21/2014:  PET scan: Hypermetabolic small right supraclavicular and right upper paratracheal lymph nodes suspicious for mets. Hypermetabolic lymph node in the right paratracheal upper mediastinum that measure approximately 0.9 x 0.5cm. (of note no impressions made of areas noted on brain MRI 07/22/13).  01/21/2014: Head MRI: Right frontal enhancing lesion has shown interval increase in size with surrounding edema and local mass effect (measured 1.8x2.1x1.6, previously 1.2x1.1x1.0) 07/16/2014: Head MRI:  Right anterior frontal enhancing mass 2.5 x 2.0 cm compatible with a metastatic lesion has increased and changed in configuration from previous 2.0 x 1.8 cm, formerly multilobular. 08/06/2014: PET scan:  Multiple bilateral FDG avid low cervical, supraclavicular, upper mediastinal, and right internal mammary lymphadenopathy, likely representing metastatic disease. A cervical node in the right lower neck should be amenable to percutaneous sampling.  12/2014: Head MRI:  Evolution of right frontal postoperative changes status post tumor resection at the vertex. Expected postoperative changes. Minimal marginal enhancement resection bed with some adjacent posterior intrinsic T1 signal again  seen. Decreasing T2/FLAIR adjacent parenchymal changes.  Stable resection cavity is left posterior frontal and right parieto-occipital regions. 04/20/2015 : PET scan: Cervical and thoracic nodal metastasis.  There was multifocal osseous metastasis (left transverse T4 process, left humeral head, right iliac wing, and left side of the sacrum).  Incidental findings included right nephrolithiasis.   04/22/2015: Bone scan: Corresponded with PET-CT with metastatic lesions evident in the left humeral head, left posterior aspect of T4, left aspect of S1, and the right iliac crest.  There was no other definite foci of metastatic disease to bone.  Uptake in the skull was most suggestive of the previous right frontal craniotomy. 05/24/2015: Bone density:  T-score of -0.8 in the right femoral neck (normal). 07/07/2015:  Head MRI:  Metastatic breast cancer with 3 resection cavities. The anterior right frontal cavity was positive for nodular marginal enhancement, new from brain MRI report 01/07/2015, and consistent with recurrent disease.  08/12/2015:  Lumbar Spine MRI: Osseous metastatic disease at L3, L4, L5, S1, and in the left iliac bone posteriorly. There was no definite epidural metastatic disease. 08/20/2015:  PET scan:  Response to therapy in the lower cervical and  thoracic adenopathy.  There was a mixed response to multifocal osseous metastases (some new lesions).  12/06/2015:  Head MRI:  Decreased nodular contrast enhancement at the right frontal lobe treatment site.  There was unchanged appearance of treatment sites within the bilateral parietal lobe size without residual or recurrent disease.  There were no new metastatic lesions. 02/18/2016: PET scan: Mixed response to therapy. There hadbeen improvement in right cervical lymphadenopathy and in several osseous lesions. There wereseveral other osseous lesions in the pelvis with increasing hypermetabolism compared to the prior study. As the majority of the  osseous lesions wereincreasingly sclerotic, some of this hypermetabolism could simply reflect bony healing. There was no new extraskeletal metastatic disease is noted in the neck, chest, abdomen or pelvis. 03/06/2016: Head MRI: Unchanged small focus of nodular enhancement at the right frontal resection site. There was unchanged appearance of the 2 other resection sites without abnormal enhancement. There was no evidence of new intracranial metastases. 05/23/2016:  Bone scan:  Stable focus of abnormal uptake seen in right frontal skull consistent with prior craniotomy.  There were areas of abnormal uptake identified in the left humeral head, T4 and S1 levels and right iliac crest that were present but decreased in intensity compared to prior exam suggesting improvement.  There was a new foci of abnormal uptake are noted in a right lower rib,T12 and L1 levels of spine concerning for metastatic disease. 06/07/2016:  Thoracic and lumbar spine MRI:  multiple low T1/T2 weighted signal, non enhancing lesions within the thoracic spine, which corresponded in size and location to sclerotic lesions demonstrated on the PET CT of 02/18/2016.  There were no new thoracic lesions.  There was minimal residual contrast enhancement within the L5 vertebral body metastatic lesion, decreased compared to the MRI of 08/12/2015.  There was minimal contrast enhancement at the periphery of the lesion within the L1 vertebral body. This lesion was new compared to MRI of 08/12/2015, but unchanged in size and location compared to PET CT of 02/18/2016.  There was no epidural disease, spinal canal stenosis or neural foraminal encroachment. 06/13/2016:  Head MRI revealed no evidence of residual or recurrent disease. 08/11/2016:  Chest, abdomen, and pelvic CT revealed  new 8 mm right supraclavicular, 12 mm right paratracheal and 10 mm subcarinal lymphadenopathy suspicious for metastatic nodal recurrence.  There was extensive patchy sclerotic  osseous metastases throughout the axial and proximal appendicular skeleton appear stable in size and distribution although generally increased in sclerosis since 02/18/2016 PET-CT, which was a nonspecific change that could reflect treatment effect or progression.  There was no additional sites of metastatic disease in the chest, abdomen or pelvis. 08/11/2016:  Bone scan revealed scattered foci of abnormal osseous tracer accumulation consistent with osseous metastatic disease with new sites of abnormal uptake in the thoracic spine at T5 in the posterior LEFT sixth rib. 08/31/2016:  Chest CT angiogram revealed interval confluent opacity in the posterior aspect of the right upper lobe suspicious for pneumonia. There was a small right-sided pleural effusion and adjacent right lower lobe atelectasis. 10/27/2016:  Head MRI revealed 9 new small brain metastases since 05/2016. The largest was a 10 mm metastasis in the posterior right cerebellum. The smallest lesions were punctate, and 1 of these along the posterior inferior left cerebellar hemisphere was suspicious for leptomeningeal based disease.  There was no associated edema or mass effect. 11/29/2016:  Chest, abdomen, and pelvic CT revealed multiple hepatic metastasis, a 10 mm adrenal metastasis, multiple abdominal nodes, 4.2 x 2.1  cm pancreatic head mass and a separate 1.1 cm lesion in the anterior pancreatic body, new 9 mm paraesophageal lymph node, trace pericardial thickening/fluid, 2 focal lung nodules (10 mm and 5 mm) in the RUL, and an enlarging blastic lesion of T5.   Labs:  Appointment on 12/20/2016  Component Date Value Ref Range Status  . Total Protein 12/20/2016 7.4  6.5 - 8.1 g/dL Final  . Albumin 12/20/2016 3.5  3.5 - 5.0 g/dL Final  . AST 12/20/2016 130* 15 - 41 U/L Final  . ALT 12/20/2016 106* 14 - 54 U/L Final  . Alkaline Phosphatase 12/20/2016 361* 38 - 126 U/L Final  . Total Bilirubin 12/20/2016 0.7  0.3 - 1.2 mg/dL Final  .  Bilirubin, Direct 12/20/2016 0.2  0.1 - 0.5 mg/dL Final  . Indirect Bilirubin 12/20/2016 0.5  0.3 - 0.9 mg/dL Final    Assessment:  Melinda Tanner is a 46 y.o. 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  She initially presented in 2009 while living in [place] with multi-focal right breast cancer with positive lymph node(s) which was ER+, PR+(low), and HER2/neu+. She received neoadjuvant chemotherapy (AC x 4 every 3 weeks followed by Taxol/Abraxane + carboplatin weekly x 12) without anti-HER2 treatment. BRCA1/2 testing was negative on 08/15/2016.  On 07/14/2008, she underwent bilateral mastectomies followed by reconstruction.  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   Zero of 11 lymph nodes on the right were positive for malignancy. 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 Deep margin was negative. Zero of 2 lymph nodes were positive for metastatic disease.  She received adjuvant radiation to the right breast.  She received adjuvant tamoxifen and Zoladex.  She could not tolerate symptoms of joint pain and tamoxifen was discontinued after 1-2 months.  Zoladex was continued for approximately 1.5 years.  She was diagnosed with brain metastases in 02/2011.  Pathology revealed ER+, PR+(low), and HER2/neu-.  She underwent resection followed by [facility].  On 03/09/2011, original breast tissue (09/03/2007) sent to [facility] testing.  Testing revealed ER+, PR+(borderline), and HER2/neu positive (different than initial testing).  She received Herceptin for 1 year beginning in 2013.  She then began Tykerb and Xeloda after completion of Herceptin (2014).  She was on extremely low, and probably sub-therapeutic, dosing of lapatinib + capecitabine.   She developed right supraclavicular adenopathy in 2015. She was noted to have slow disease progression in the right frontal/parietal lesion with minimal presence of systemic  disease on PET scans. Capecitabine + lapatinib were discontinued in 04/2014.   She underwent repeat craniotomy with resection of brain metastasis on 09/04/2014.  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 Tumor was ER positive (> 90%) PR negative and HER-2/neu negative (1+ IHC).  She started tamoxifen in 09/2014, but discontinued it secondary to pain in hip, back, and shoulder.  She restarted tamoxifen on 04/23/2015.  Tamoxifen was discontinued on 08/23/2015 secondary to progressive disease.  She received Zoladex on 04/30/2015.  She underwent laparoscopic bilateral oophorectomy on 05/18/2015.  She receives monthly Xgeva (began 04/30/2015; last 11/13/2016).  She is post-menopausal.  She received Faslodex from 08/23/2015 - 04/04/2016.  She received 7 cycles of Ibrance (09/20/2015 - 06/18/2016).  Patient received palliative radiation 30 Gy to the left humerus and lumbar spine from 08/25/2015 - 09/09/2015.  She was not eligible for a clinical trial.  She completed palliative radiation to T12-L1  of 3000 cGy (06/21/2016 - 07/04/2016).  She received palliative radiation to the left hip from 08/17/2016 - 08/30/2016.    Xeloda was cost prohibitive.  She does not wish to pursue navelbine or gemcitabine.  She is s/p day 8 of cycle #2 Abraxane (10/09/2016 - 10/30/2016).  CA27.29 has been followed: 177.8 on 04/16/2015, 276.4 on 05/28/2015, 287.5 on 07/13/2015, 333.8 on 07/26/2015, 412.9 on 08/23/2015, 315.6 on 09/20/2015, 188.8 on 10/18/2015, 151.5 on 11/15/2015, 122.7 on 12/10/2015, 94.6 on 01/10/2016, 80.9 on 02/07/2016, 79 on 03/06/2016, 84.8 on 04/04/2016, 85.2 on 05/02/2016, 91.1 on 05/30/2016, 121 on 06/30/2016, 182.2 on 07/31/2016, 420.5 on 10/09/2016, 564.6 on 10/30/2016, and 1322.6 on 11/27/2016.  Head MRI on 06/13/2016 revealed no evidence of residual or recurrent disease.  Head MRI on 10/27/2016 revealed 9 new small  brain metastases since 05/2016. The largest was a 10 mm metastasis in the posterior right cerebellum. The smallest lesions were punctate, and 1 of these along the posterior inferior left cerebellar hemisphere was suspicious for leptomeningeal based disease.  There was no associated edema or mass effect.  She completed whole brain radiation (11/08/2016 - 11/24/2016).  She is on a steroid taper.  Bone scan on 08/11/2016 revealed scattered foci of abnormal osseous tracer accumulation consistent with osseous metastatic disease with new sites of abnormal uptake in the thoracic spine at T5 in the posterior LEFT sixth rib.  Chest, abdomen, and pelvic CT on 11/29/2016 revealed multiple hepatic metastasis, a 10 mm adrenal metastasis, multiple abdominal nodes, 4.2 x 2.1 cm pancreatic head mass and a separate 1.1 cm lesion in the anterior pancreatic body, new 9 mm paraesophageal lymph node, trace pericardial thickening/fluid, 2 focal lung nodules (10 mm and 5 mm) in the RUL, and an enlarging blastic lesion of T5.  Bone density study on 05/24/2015 revealed a T-score of -0.8 in the right femoral neck (normal).  She is on a Fentanyl 25 mcg/hr patch.  Pain is well controlled.  Code status is FULL CODE.  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  Work-up on 10/25/2015 revealed a B12 of 141 (low).  B12 was 205 on 12/13/2015 with an MMA of 171 (normal) on 01/10/2016.  Ferritin was 43. Iron studies included a saturation of 13% and a TIBC of 354. TSH and folate were normal.  Reticulocyte count was 2.2%.  She declined B12 injections.  She started oral B12 1000 mcg on 11/05/2015.  She takes her B12 sporadically.  B12 level was 205 (low normal) on 12/13/2015.  Diet is good.  She denies any melena or hematochezia.  Symptomatically, she denies any pain.  LFTs remain elevated; (AST 60, ALT 66, and alkaline phosphatase 169).  Exam is stable.  Plan: 1.  Review interval increase in  liver function tests.  Etiology concerning for progressive disease secondary to increasing LFTs off therpay.  Abraxane associated with an increased AST (39%) and increased alkaline phosphatase (36%). 2.  Discuss abdomen CT.  Discuss consideration of change in chemotherapy (eribulin). [facility] One testing. 4.  Schedule abdomen CT scan on 12/22/2016. 5.  RTC on Monday for MD assessment, labs (CBC with diff, CMP, CA27.29), review of CT scan, and +/- chemotherapy (eribulin).   Lequita Asal, MD 12/21/2016

## 2016-12-21 NOTE — Patient Instructions (Signed)
Eribulin solution for injection What is this medicine? ERIBULIN (er e bu lin) is a chemotherapy drug. It is used to treat breast cancer and liposarcoma. This medicine may be used for other purposes; ask your health care provider or pharmacist if you have questions. COMMON BRAND NAME(S): [place] What should I tell my health care provider before I take this medicine? They need to know if you have any of these conditions: -heart disease -history of irregular heartbeat -kidney disease -liver disease -low blood counts, like low white cell, platelet, or red cell counts -low levels of potassium or magnesium in the blood -an unusual or allergic reaction to eribulin, other medicines, foods, dyes, or preservatives -pregnant or trying to get pregnant -breast-feeding How should I use this medicine? This medicine is for infusion into a vein. It is given by a health care professional in a hospital or clinic setting. Talk to your pediatrician regarding the use of this medicine in children. Special care may be needed. Overdosage: If you think you have taken too much of this medicine contact a poison control center or emergency room at once. NOTE: This medicine is only for you. Do not share this medicine with others. What if I miss a dose? It is important not to miss your dose. Call your doctor or health care professional if you are unable to keep an appointment. What may interact with this medicine? Do not take this medicine with any of the following medications: -amiodarone -astemizole -arsenic trioxide -bepridil -bretylium -chloroquine -chlorpromazine -cisapride -clarithromycin -dextromethorphan,  quinidine -disopyramide -dofetilide -droperidol -dronedarone -erythromycin -grepafloxacin -halofantrine -haloperidol -ibutilide -levomethadyl -mesoridazine -methadone -pentamidine -procainamide -quinidine -pimozide -posaconazole -probucol -propafenone -saquinavir -sotalol -sparfloxacin -terfenadine -thioridazine -troleandomycin -ziprasidone This list may not describe all possible interactions. Give your health care provider a list of all the medicines, herbs, non-prescription drugs, or dietary supplements you use. Also tell them if you smoke, drink alcohol, or use illegal drugs. Some items may interact with your medicine. What should I watch for while using this medicine? This drug may make you feel generally unwell. This is not uncommon, as chemotherapy can affect healthy cells as well as cancer cells. Report any side effects. Continue your course of treatment even though you feel ill unless your doctor tells you to stop. Call your doctor or health care professional for advice if you get a fever, chills or sore throat, or other symptoms of a cold or flu. Do not treat yourself. This drug decreases your body's ability to fight infections. Try to avoid being around people who are sick. This medicine may increase your risk to bruise or bleed. Call your doctor or health care professional if you notice any unusual bleeding. You may need blood work done while you are taking this medicine. Do not become pregnant while taking this medicine or for 2 weeks after stopping it. Women should inform their doctor if they wish to become pregnant or think they might be pregnant. Men should not father a child while taking this medicine and for 3.5 months after stopping it. There is a potential for serious side effects to an unborn child. Talk to your health care professional or pharmacist for more information. Do not breast-feed an infant while taking this medicine or for 2 weeks after stopping  it. What side effects may I notice from receiving this medicine? Side effects that you should report to your doctor or health care professional as soon as possible: -allergic reactions like skin rash, itching or hives, swelling of the face,  lips, or tongue -low blood counts - this medicine may decrease the number of white blood cells, red blood cells and platelets. You may be at increased risk for infections and bleeding. -signs of infection - fever or chills, cough, sore throat, pain or difficulty passing urine -signs of decreased platelets or bleeding - bruising, pinpoint red spots on the skin, black, tarry stools, blood in the urine -signs of decreased red blood cells - unusually weak or tired, fainting spells, lightheadedness -pain, tingling, numbness in the hands or feet Side effects that usually do not require medical attention (report to your doctor or health care professional if they continue or are bothersome): -constipation -hair loss -headache -loss of appetite -muscle or joint pain -nausea, vomiting -stomach pain This list may not describe all possible side effects. Call your doctor for medical advice about side effects. You may report side effects to FDA at 1-800-FDA-1088. Where should I keep my medicine? This drug is given in a hospital or clinic and will not be stored at home. NOTE: This sheet is a summary. It may not cover all possible information. If you have questions about this medicine, talk to your doctor, pharmacist, or health care provider.  2018 [employer] (2015-04-08 10:11:26)

## 2016-12-21 NOTE — Progress Notes (Signed)
Patient states she is having discomfort in her abdomen.  Otherwise, no complaints.

## 2016-12-22 ENCOUNTER — Inpatient Hospital Stay: Payer: Medicare HMO

## 2016-12-22 ENCOUNTER — Ambulatory Visit
Admission: RE | Admit: 2016-12-22 | Discharge: 2016-12-22 | Disposition: A | Discharge status: Home / Self Care | Payer: Medicare HMO | Source: Ambulatory Visit | Attending: Hematology and Oncology | Admitting: Hematology and Oncology

## 2016-12-22 DIAGNOSIS — C50919 Malignant neoplasm of unspecified site of unspecified female breast: Secondary | ICD-10-CM | POA: Diagnosis present

## 2016-12-22 DIAGNOSIS — E876 Hypokalemia: Secondary | ICD-10-CM | POA: Diagnosis not present

## 2016-12-22 DIAGNOSIS — Z170 Estrogen receptor positive status [ER+]: Secondary | ICD-10-CM | POA: Diagnosis not present

## 2016-12-22 DIAGNOSIS — C7951 Secondary malignant neoplasm of bone: Secondary | ICD-10-CM | POA: Diagnosis not present

## 2016-12-22 DIAGNOSIS — C787 Secondary malignant neoplasm of liver and intrahepatic bile duct: Secondary | ICD-10-CM | POA: Insufficient documentation

## 2016-12-22 DIAGNOSIS — J90 Pleural effusion, not elsewhere classified: Secondary | ICD-10-CM | POA: Diagnosis not present

## 2016-12-22 DIAGNOSIS — C7970 Secondary malignant neoplasm of unspecified adrenal gland: Secondary | ICD-10-CM | POA: Diagnosis not present

## 2016-12-22 DIAGNOSIS — C50911 Malignant neoplasm of unspecified site of right female breast: Secondary | ICD-10-CM | POA: Diagnosis not present

## 2016-12-22 DIAGNOSIS — C7931 Secondary malignant neoplasm of brain: Secondary | ICD-10-CM | POA: Diagnosis not present

## 2016-12-22 DIAGNOSIS — K7689 Other specified diseases of liver: Secondary | ICD-10-CM | POA: Diagnosis not present

## 2016-12-22 DIAGNOSIS — Z5111 Encounter for antineoplastic chemotherapy: Secondary | ICD-10-CM | POA: Diagnosis not present

## 2016-12-22 DIAGNOSIS — R918 Other nonspecific abnormal finding of lung field: Secondary | ICD-10-CM | POA: Diagnosis not present

## 2016-12-22 DIAGNOSIS — J9811 Atelectasis: Secondary | ICD-10-CM | POA: Insufficient documentation

## 2016-12-22 DIAGNOSIS — Z95828 Presence of other vascular implants and grafts: Secondary | ICD-10-CM

## 2016-12-22 DIAGNOSIS — D649 Anemia, unspecified: Secondary | ICD-10-CM | POA: Diagnosis not present

## 2016-12-22 MED ORDER — HEPARIN SOD (PORK) LOCK FLUSH 100 UNIT/ML IV SOLN
500.0000 [IU] | INTRAVENOUS | Status: AC | PRN
Start: 2016-12-22 — End: 2016-12-22
  Administered 2016-12-22: 500 [IU]

## 2016-12-22 MED ORDER — IOPAMIDOL (ISOVUE-300) INJECTION 61%
100.0000 mL | Freq: Once | INTRAVENOUS | Status: AC | PRN
  Administered 2016-12-22: 100 mL via INTRAVENOUS

## 2016-12-22 MED ORDER — SODIUM CHLORIDE 0.9% FLUSH
10.0000 mL | INTRAVENOUS | Status: AC | PRN
  Administered 2016-12-22: 10 mL
  Filled 2016-12-22: qty 10

## 2016-12-25 ENCOUNTER — Inpatient Hospital Stay: Payer: Medicare HMO

## 2016-12-25 ENCOUNTER — Inpatient Hospital Stay (HOSPITAL_BASED_OUTPATIENT_CLINIC_OR_DEPARTMENT_OTHER): Payer: Medicare HMO | Admitting: Hematology and Oncology

## 2016-12-25 ENCOUNTER — Other Ambulatory Visit: Payer: Self-pay | Admitting: *Deleted

## 2016-12-25 ENCOUNTER — Other Ambulatory Visit: Payer: Self-pay | Admitting: Hematology and Oncology

## 2016-12-25 ENCOUNTER — Ambulatory Visit
Admission: RE | Admit: 2016-12-25 | Discharge: 2016-12-25 | Disposition: A | Discharge status: Home / Self Care | Payer: Medicare HMO | Source: Ambulatory Visit | Attending: Urgent Care | Admitting: Urgent Care

## 2016-12-25 ENCOUNTER — Telehealth: Payer: Self-pay | Admitting: *Deleted

## 2016-12-25 VITALS — BP 114/79 | HR 106 | Temp 97.5°F | Resp 18 | Wt 142.9 lb

## 2016-12-25 DIAGNOSIS — K8020 Calculus of gallbladder without cholecystitis without obstruction: Secondary | ICD-10-CM | POA: Diagnosis not present

## 2016-12-25 DIAGNOSIS — E538 Deficiency of other specified B group vitamins: Secondary | ICD-10-CM

## 2016-12-25 DIAGNOSIS — C50919 Malignant neoplasm of unspecified site of unspecified female breast: Secondary | ICD-10-CM

## 2016-12-25 DIAGNOSIS — Z5111 Encounter for antineoplastic chemotherapy: Secondary | ICD-10-CM | POA: Diagnosis not present

## 2016-12-25 DIAGNOSIS — C7951 Secondary malignant neoplasm of bone: Secondary | ICD-10-CM

## 2016-12-25 DIAGNOSIS — Z923 Personal history of irradiation: Secondary | ICD-10-CM

## 2016-12-25 DIAGNOSIS — C787 Secondary malignant neoplasm of liver and intrahepatic bile duct: Secondary | ICD-10-CM | POA: Insufficient documentation

## 2016-12-25 DIAGNOSIS — Z853 Personal history of malignant neoplasm of breast: Secondary | ICD-10-CM

## 2016-12-25 DIAGNOSIS — R945 Abnormal results of liver function studies: Secondary | ICD-10-CM

## 2016-12-25 DIAGNOSIS — C7801 Secondary malignant neoplasm of right lung: Secondary | ICD-10-CM

## 2016-12-25 DIAGNOSIS — Z170 Estrogen receptor positive status [ER+]: Secondary | ICD-10-CM

## 2016-12-25 DIAGNOSIS — R7989 Other specified abnormal findings of blood chemistry: Secondary | ICD-10-CM

## 2016-12-25 DIAGNOSIS — Z9013 Acquired absence of bilateral breasts and nipples: Secondary | ICD-10-CM

## 2016-12-25 DIAGNOSIS — C7970 Secondary malignant neoplasm of unspecified adrenal gland: Secondary | ICD-10-CM

## 2016-12-25 DIAGNOSIS — R918 Other nonspecific abnormal finding of lung field: Secondary | ICD-10-CM | POA: Diagnosis not present

## 2016-12-25 DIAGNOSIS — E876 Hypokalemia: Secondary | ICD-10-CM | POA: Diagnosis not present

## 2016-12-25 DIAGNOSIS — D649 Anemia, unspecified: Secondary | ICD-10-CM | POA: Diagnosis not present

## 2016-12-25 DIAGNOSIS — C50912 Malignant neoplasm of unspecified site of left female breast: Secondary | ICD-10-CM

## 2016-12-25 DIAGNOSIS — Z7189 Other specified counseling: Secondary | ICD-10-CM

## 2016-12-25 DIAGNOSIS — D0512 Intraductal carcinoma in situ of left breast: Secondary | ICD-10-CM | POA: Diagnosis not present

## 2016-12-25 DIAGNOSIS — C7931 Secondary malignant neoplasm of brain: Secondary | ICD-10-CM

## 2016-12-25 DIAGNOSIS — R740 Nonspecific elevation of levels of transaminase and lactic acid dehydrogenase [LDH]: Secondary | ICD-10-CM

## 2016-12-25 DIAGNOSIS — C50911 Malignant neoplasm of unspecified site of right female breast: Secondary | ICD-10-CM | POA: Diagnosis not present

## 2016-12-25 DIAGNOSIS — J90 Pleural effusion, not elsewhere classified: Secondary | ICD-10-CM

## 2016-12-25 DIAGNOSIS — C7889 Secondary malignant neoplasm of other digestive organs: Secondary | ICD-10-CM

## 2016-12-25 LAB — CBC WITH DIFFERENTIAL/PLATELET
Basophils Absolute: 0.1 10*3/uL (ref 0–0.1)
Basophils Relative: 1 %
Eosinophils Absolute: 0 10*3/uL (ref 0–0.7)
Eosinophils Relative: 0 %
HCT: 35.5 % (ref 35.0–47.0)
Hemoglobin: 11.6 g/dL — ABNORMAL LOW (ref 12.0–16.0)
Lymphocytes Relative: 5 %
Lymphs Abs: 0.5 10*3/uL — ABNORMAL LOW (ref 1.0–3.6)
MCH: 29.3 pg (ref 26.0–34.0)
MCHC: 32.6 g/dL (ref 32.0–36.0)
MCV: 89.8 fL (ref 80.0–100.0)
Monocytes Absolute: 0.9 10*3/uL (ref 0.2–0.9)
Monocytes Relative: 10 %
Neutro Abs: 7.7 10*3/uL — ABNORMAL HIGH (ref 1.4–6.5)
Neutrophils Relative %: 84 %
Platelets: 395 10*3/uL (ref 150–440)
RBC: 3.95 MIL/uL (ref 3.80–5.20)
RDW: 18.1 % — ABNORMAL HIGH (ref 11.5–14.5)
WBC: 9.2 10*3/uL (ref 3.6–11.0)

## 2016-12-25 LAB — COMPREHENSIVE METABOLIC PANEL
ALT: 184 U/L — ABNORMAL HIGH (ref 14–54)
AST: 191 U/L — ABNORMAL HIGH (ref 15–41)
Albumin: 3.1 g/dL — ABNORMAL LOW (ref 3.5–5.0)
Alkaline Phosphatase: 391 U/L — ABNORMAL HIGH (ref 38–126)
Anion gap: 10 (ref 5–15)
BUN: 6 mg/dL (ref 6–20)
CO2: 26 mmol/L (ref 22–32)
Calcium: 8.6 mg/dL — ABNORMAL LOW (ref 8.9–10.3)
Chloride: 96 mmol/L — ABNORMAL LOW (ref 101–111)
Creatinine, Ser: 0.48 mg/dL (ref 0.44–1.00)
GFR calc Af Amer: >60 mL/min (ref >60)
GFR calc non Af Amer: >60 mL/min (ref >60)
Glucose, Bld: 118 mg/dL — ABNORMAL HIGH (ref 65–99)
Potassium: 3.4 mmol/L — ABNORMAL LOW (ref 3.5–5.1)
Sodium: 132 mmol/L — ABNORMAL LOW (ref 135–145)
Total Bilirubin: 2.8 mg/dL — ABNORMAL HIGH (ref 0.3–1.2)
Total Protein: 6.3 g/dL — ABNORMAL LOW (ref 6.5–8.1)

## 2016-12-25 LAB — PROTIME-INR
INR: 1.07
Prothrombin Time: 13.8 seconds (ref 11.4–15.2)

## 2016-12-25 NOTE — Telephone Encounter (Signed)
Called for authorization for Eribulin.  Good to go per Brandi.

## 2016-12-25 NOTE — Patient Instructions (Signed)
Eribulin solution for injection Qu es este medicamento? La ERIBULINA es un agente quimioteraputico. Se utiliza para tratar el cncer de mama y el liposarcoma. Este medicamento puede ser utilizado para otros usos; si tiene alguna pregunta consulte con su proveedor de atencin mdica o con su farmacutico. MARCAS COMUNES: [place] Qu le debo informar a mi profesional de la salud antes de tomar este medicamento? Necesitan saber si usted presenta alguno de los siguientes problemas o situaciones: enfermedad cardiaca antecedentes de ritmo cardiaco irregular enfermedad renal enfermedad heptica recuentos sanguneos bajos, como baja cantidad de glbulos blancos, plaquetas o glbulos rojos bajos niveles de potasio o magnesio en la sangre una reaccin alrgica o inusual a la eribulina, a otros medicamentos, alimentos, colorantes o conservantes si est embarazada o buscando quedar embarazada si est amamantando a un beb Cmo debo utilizar este medicamento? Este medicamento se administra mediante infusin por va intravenosa. Lo administra un profesional de Technical sales engineer en un hospital o en un entorno clnico. Hable con su pediatra para informarse acerca del uso de este medicamento en nios. Puede requerir atencin especial. Sobredosis: Pngase en contacto inmediatamente con un centro toxicolgico o una sala de urgencia si usted cree que haya tomado demasiado medicamento. ATENCIN: [employer] es solo para usted. No comparta este medicamento con nadie. Qu sucede si me olvido de una dosis? Es importante no olvidar ninguna dosis. Informe a su mdico o a su profesional de la salud si no puede asistir a Photographer. Qu puede interactuar con este medicamento? No tome esta medicina con ninguno de los siguientes medicamentos: amiodarona astemizol trixido de arsnico bepridil bretilio cloroquina clorpromacina cisapride claritromicina dextromethorphan, quinidina disopiramida dofetilida droperidol dronedarona  eritromicina grepafloxacino halofantrina haloperidol ibutilida levometadilo mesoridazina metadona pentamidina procainamida quinidina pimozida posaconazol probucol propafenona saquinavir sotalol esparfloxacino terfenadina tioridazina troleandomicina ziprasidona Puede ser que esta lista no menciona todas las posibles interacciones. Informe a su profesional de [employer]	[place] de [employer] productos a base de hierbas, medicamentos de [place] o suplementos nutritivos que est tomando. Si usted fuma, consume bebidas alcohlicas o si utiliza drogas ilegales, indqueselo tambin a su profesional de [employer]	[place]. Algunas sustancias pueden interactuar con su medicamento. A qu debo estar atento al usar Coca[employer]? Este medicamento podra hacerle sentir un Nurse, mental health. Esto es normal ya que la quimioterapia puede Print production planner tanto a las clulas sanas como a las clulas cancerosas. Si presenta algn efecto secundario, infrmelo. Contine con el tratamiento aun si se siente enfermo, a menos que su mdico le indique que lo suspenda. Consulte a su mdico o a su profesional de la salud si tiene fiebre, escalofros o dolor de garganta, o cualquier otro sntoma de resfro o gripe. No se trate usted mismo. Este medicamento reduce la capacidad del cuerpo para combatir infecciones. Trate de no acercarse a personas que estn enfermas. Este medicamento podra aumentar el riesgo de moretones o sangrado. Consulte a su mdico o a su profesional de la salud si observa sangrados inusuales. Usted podra necesitar realizarse [employer] de sangre mientras est usando [place]. No debe quedar embarazada mientras est tomando este medicamento o por 2 semanas despus de dejar de usarlo. Las mujeres deben informar a su mdico si estn buscando quedar embarazadas o si creen que podran estar embarazadas. Los hombres no deben tener hijos mientras estn recibiendo Coca-Cola y durante 3.5 meses despus de dejar de usarlo. Existe la  posibilidad de efectos secundarios graves en un beb sin nacer. Para obtener ms informacin, hable con su profesional de la  salud o su farmacutico. No debe amamantar a un beb mientras est tomando este medicamento o por 2 semanas despus de dejar de usarlo. Qu efectos secundarios puedo tener al [employer] este medicamento? Efectos secundarios que debe informar a su mdico o a Barrister's clerk de la salud tan pronto como sea posible: Chief of Staff, como erupcin cutnea, picazn o urticarias, e hinchazn de la cara, los labios o la lengua conteos sanguneos bajos: este medicamento podra reducir la cantidad de glbulos blancos, glbulos rojos y plaquetas. Su riesgo de infeccin y sangrado podra ser mayor. signos de infeccin: fiebre o escalofros, tos, dolor de garganta, Social research officer, government o dificultad para orinar signos de disminucin en la cantidad de plaquetas o sangrado: moretones, puntos rojos en la piel, heces de color negro y aspecto alquitranado, sangre en la orina signos de disminucin en la cantidad de glbulos rojos: cansancio o debilidad inusual, desmayos, aturdimiento Social research officer, government, hormigueo o entumecimiento de las manos o los pies Efectos secundarios que generalmente no requieren atencin mdica (infrmelos a su mdico o a su profesional de la salud si persisten o si son molestos): estreimiento cada del cabello dolor de cabeza prdida del apetito dolores musculares o articulares nuseas, vmito dolor estomacal Puede ser que esta lista no menciona todos los posibles efectos secundarios. Comunquese a su mdico por asesoramiento mdico [employer] Usted puede informar los efectos secundarios a la FDA por telfono al 1-800-FDA-1088. Dnde debo guardar mi medicina? Este medicamento se administra en hospitales o clnicas y no necesitar guardarlo en su domicilio. ATENCIN: Este folleto es un resumen. Puede ser que no cubra toda la posible informacin. Si usted tiene preguntas acerca de  esta medicina, consulte con su mdico, su farmacutico o su profesional de Technical sales engineer.  2018 Elsevier/Gold Standard (2016-04-06 00:00:00) Vinorelbine injection Qu es este medicamento? La VINORELBINA es un agente quimioteraputico. Este medicamento acta sobre las clulas que se dividen rpidamente, como las clulas cancergenas, y finalmente provoca la muerte de estas clulas. Se utiliza en el tratamiento de cncer, como cncer de pulmn. Este medicamento puede ser utilizado para otros usos; si tiene alguna pregunta consulte con su proveedor de atencin mdica o con su farmacutico. MARCAS COMUNES: [place] Qu le debo informar a mi profesional de la salud antes de tomar este medicamento? Necesita saber si usted presenta alguno de los [employer] o situaciones: -trastornos sanguneos -infecciones (especialmente varicela y herpes) -enfermedad heptica -enfermedad pulmonar -enfermedad del sistema nervioso -radioterapia previa o en curso -una reaccin alrgica o inusual a la vinorelbina, a otros agentes quimioteraputicos, a otros medicamentos, alimentos, colorantes o conservantes -si est embarazada o buscando quedar embarazada -si est amamantando a un beb Cmo debo utilizar este medicamento? Este medicamento se administra mediante infusin por va intravenosa. Lo administra un profesional de la salud calificado en un hospital o en un entorno clnico. Si experimenta dolor, hinchazn, ardor o cualquier sensacin inusual alrededor del lugar de la inyeccin, informe inmediatamente a su profesional de [employer]	[place]. Hable con su pediatra para informarse acerca del uso de este medicamento en nios. Puede requerir atencin especial. Sobredosis: Pngase en contacto inmediatamente con un centro toxicolgico o una sala de urgencia si usted cree que haya tomado demasiado medicamento. ATENCIN: [employer] es solo para usted. No comparta este medicamento con nadie. Qu sucede si me olvido  de una dosis? Es importante no olvidar ninguna dosis. Informe a su mdico o a su profesional de la salud si no puede asistir a Photographer. Qu puede interactuar con este medicamento? No  tome esta medicina con ninguno de los siguientes medicamentos: -itraconazol -voriconazol Esta medicina tambin puede interactuar con los siguientes medicamentos: -ciclosporina -eritromicina -fluconazol -quetoconazol -medicamentos que se utilizan para tratar el VIH, incluyendo delavirdina, efavirenz, nevirapina -medicamentos para las convulsiones, tales como etotona, fenobarbital o fenitona -medicamentos para incrementar los conteos sanguneos, tales como filgrastim, pegfilgrastim, sargramostim -otros agentes quimioteraputicos, tales como cisplatino, mitomicina, paclitaxel -vacunas Consulte a su mdico o a su profesional de la salud antes de tomar cualquiera de los siguientes medicamentos: -acetaminofeno -aspirina -ibuprofeno -quetoprofeno -naproxeno Puede ser que esta lista no menciona todas las posibles interacciones. Informe a su profesional de [employer]	[place] de [employer] productos a base de hierbas, medicamentos de [place] o suplementos nutritivos que est tomando. Si usted fuma, consume bebidas alcohlicas o si utiliza drogas ilegales, indqueselo tambin a su profesional de [employer]	[place]. Algunas sustancias pueden interactuar con su medicamento. A qu debo estar atento al usar Coca-Cola? Se supervisar su condicin atentamente mientras reciba este medicamento. Tendr que hacerse anlisis de sangre peridicos mientras est tomando este medicamento. Este medicamento puede hacerle sentir un Nurse, mental health. Esto es normal ya que la quimioterapia afecta tanto a las clulas sanas como a las clulas cancerosas. Si presenta alguno de los [employer], infrmelos. Sin embargo, contine con el tratamiento aun si se siente enfermo, a menos que su mdico le indique que lo suspenda. En algunos casos,  podr recibir [employer] para ayudarle con los efectos secundarios. Siga las instrucciones para usarlos. Consulte a su mdico o a su profesional de la salud por asesoramiento si tiene fiebre, escalofros, dolor de garganta o cualquier otro sntoma de resfro o gripe. No se trate usted mismo. Este medicamento puede reducir la capacidad del cuerpo para combatir infecciones. Trate de no acercarse a personas que estn enfermas. [employer] puede aumentar el riesgo de magulladuras o sangrado. Consulte a su mdico o a su profesional de la salud si observa sangrados inusuales. Proceda con cuidado al cepillar sus dientes, usar hilo dental o Risk manager palillos para los dientes, ya que puede contraer una infeccin o Therapist, art con mayor facilidad. Si se somete a algn tratamiento dental, informe a su dentista que est [employer]. Evite tomar productos que contienen aspirina, acetaminofeno, ibuprofeno, naproxeno o quetoprofeno a menos que as lo indique su mdico. Estos productos pueden disimular la fiebre. No se debe quedar embarazada mientras est recibiendo Coca-Cola. Las mujeres deben informar a su mdico si estn buscando quedar embarazadas o si creen que estn embarazadas. Existe la posibilidad de efectos secundarios graves a un beb sin nacer. Para ms informacin hable con su profesional de la salud o su farmacutico. No debe amamantar a un beb mientras est tomando este medicamento. Hombres deben Risk manager un condn de ltex durante contacto sexual con Ardelia Mems mujer mientras estn tomando este medicamento y durante 3 meses despus de dejar de Systems developer. Un condn de ltex es necesario incluso si usted ha tenido [employer]. Comunquese con su mdico de inmediato si su pareja se queda embarazada. No done esperma mientras est tomando este medicamento y durante 3 meses despus de dejar de tomar Coca[employer]a. Los hombres deben informar a sus mdicos si desean tener  hijos. Este medicamento puede disminuir el conteo de esperma. Qu efectos secundarios puedo tener al [employer] este medicamento? Efectos secundarios que debe informar a su mdico o a Barrister's clerk de la salud tan pronto como sea posible: -reacciones alrgicas como erupcin cutnea, picazn o urticarias, hinchazn de la cara,  labios o lengua -conteos sanguneos bajos - este medicamento puede reducir la cantidad de glbulos blancos, glbulos rojos y plaquetas. Su riesgo de infeccin y [place]. -signos de infeccin - fiebre o escalofros, tos, dolor de garganta, Social research officer, government o dificultad para Garment/textile technologist -signos de reduccin de plaquetas o sangrado - magulladuras, puntos rojos en la piel, heces de color oscuro o con aspecto alquitranado, sangrado por la nariz -signos de reduccin de glbulos rojos - cansancio o debilidad inusual, desmayos, sensacin de mareo -problemas respiratorios -dolor en el pecho -estreimiento -tos -llagas en la boca -nuseas y vmito -dolor, hinchazn, enrojecimiento o irritacin en el lugar de la inyeccin -dolor, hormigueo, entumecimiento de manos o pies -dolor estomacal -dificultad para orinar o cambios en el volumen de orina Efectos secundarios que, por lo general, no requieren atencin mdica (debe informarlos a su mdico o a su profesional de la salud si persisten o si son molestos): -diarrea -cada del cabello -dolor de la mandibula -prdida del apetito Puede ser que esta lista no menciona todos los posibles efectos secundarios. Comunquese a su mdico por asesoramiento mdico [employer] Usted puede informar los efectos secundarios a la FDA por telfono al 1-800-FDA-1088. Dnde debo guardar mi medicina? Este medicamento se administra en hospitales o clnicas y no necesitar guardarlo en su domicilio. ATENCIN: Este folleto es un resumen. Puede ser que no cubra toda la posible informacin. Si usted tiene preguntas acerca de esta  medicina, consulte con su mdico, su farmacutico o su profesional de Technical sales engineer.  2018 Elsevier/Gold Standard (2014-04-28 00:00:00) Gemcitabine injection Qu es este medicamento? La GEMCITABINA es un agente quimioteraputico. Este medicamento se [place] en el tratamiento de muchos tipos de cncer, como el cncer de pulmn, de mama, de pncreas y de ovarios. Este medicamento puede ser utilizado para otros usos; si tiene alguna pregunta consulte con su proveedor de atencin mdica o con su farmacutico. MARCAS COMUNES: [place] Qu le debo informar a mi profesional de la salud antes de tomar este medicamento? Necesita saber si usted presenta alguno de los siguientes problemas o situaciones: -trastornos sanguneos -infeccin -enfermedad renal -enfermedad heptica -radioterapia reciente o continua -una reaccin alrgica o inusual a la gemcitabina, a otros agentes quimioteraputicos, a otros medicamentos, alimentos, colorantes o conservantes -si est embarazada o buscando quedar embarazada -si est amamantando a un beb Cmo debo utilizar este medicamento? Este medicamento se administra mediante infusin por va intravenosa. Lo administra un profesional de la salud calificado en un hospital o en un entorno clnico. Hable con su pediatra para informarse acerca del uso de este medicamento en nios. Puede requerir atencin especial. Sobredosis: Pngase en contacto inmediatamente con un centro toxicolgico o una sala de urgencia si usted cree que haya tomado demasiado medicamento. ATENCIN: [employer] es solo para usted. No comparta este medicamento con nadie. Qu sucede si me olvido de una dosis? Es importante no olvidar ninguna dosis. Informe a su mdico o a su profesional de la salud si no puede asistir a Photographer. Qu puede interactuar con este medicamento? -medicamentos para incrementar los conteos sanguneos, tales como filgrastim, pegfilgrastim, sargramostim -algunos otros agentes  quimioteraputicos como cisplatino -vacunas Consulte a su mdico o a su profesional de la salud antes de tomar cualquiera de los siguientes medicamentos: -acetaminofeno -aspirina -ibuprofeno -quetoprofeno -naproxeno Puede ser que esta lista no menciona todas las posibles interacciones. Informe a su profesional de [employer]	[place] de [employer] productos a base de hierbas, medicamentos de [place] o suplementos nutritivos que est tomando.  Si usted fuma, consume bebidas alcohlicas o si utiliza drogas ilegales, indqueselo tambin a su profesional de [employer]	[place]. Algunas sustancias pueden interactuar con su medicamento. A qu debo estar atento al usar Coca-Cola? Visite a su mdico para chequear su evolucin. Este medicamento puede hacerle sentir un Nurse, mental health. Esto es normal ya que la quimioterapia afecta tanto a las clulas sanas como a las clulas cancerosas. Si presenta alguno de los [employer], infrmelos. Sin embargo, contine con el tratamiento aun si se siente enfermo, a menos que su mdico le indique que lo suspenda. En algunos casos, podr recibir [employer] para ayudarle con los efectos secundarios. Siga las instrucciones para usarlos. Consulte a su mdico o a su profesional de la salud por asesoramiento si tiene fiebre, escalofros, dolor de garganta o cualquier otro sntoma de resfro o gripe. No se trate usted mismo. Este medicamento puede reducir la capacidad del cuerpo para combatir infecciones. Trate de no acercarse a personas que estn enfermas. [employer] puede aumentar el riesgo de magulladuras o sangrado. Consulte a su mdico o a su profesional de la salud si observa sangrados inusuales. Proceda con cuidado al cepillar sus dientes, usar hilo dental o Risk manager palillos para los dientes, ya que puede contraer una infeccin o Therapist, art con mayor facilidad. Si se somete a algn tratamiento dental, informe a su dentista que est [employer]. Evite tomar productos que contienen aspirina, acetaminofeno, ibuprofeno, naproxeno o quetoprofeno a menos que as lo indique su mdico. Estos productos pueden disimular la fiebre. Las mujeres deben informar a su mdico si estn buscando quedar embarazadas o si creen que estn embarazadas. Existe la posibilidad de efectos secundarios graves a un beb sin nacer. Para ms informacin hable con su profesional de la salud o su farmacutico. No debe amamantar a un beb mientras est tomando este medicamento. Qu efectos secundarios puedo tener al [employer] este medicamento? Efectos secundarios que debe informar a su mdico o a Barrister's clerk de la salud tan pronto como sea posible: -reacciones alrgicas como erupcin cutnea, picazn o urticarias, hinchazn de la cara, labios o lengua -conteos sanguneos bajos - este medicamento puede reducir la cantidad de glbulos blancos, glbulos rojos y plaquetas. Su riesgo de infeccin y [place]. -signos de infeccin - fiebre o escalofros, tos, dolor de garganta, Social research officer, government o dificultad para orinar -signos de reduccin de plaquetas o sangrado - magulladuras, puntos rojos en la piel, heces de color oscuro o con aspecto alquitranado, sangre en la orina -signos de reduccin de glbulos rojos - cansancio o debilidad inusual, desmayos, sensacin de mareo -problemas respiratorios -dolor en el pecho -llagas en la boca -nuseas y vmito -dolor, enrojecimiento, hinchazn en el lugar de la inyeccin -dolor, hormigueo, entumecimiento de manos o pies -dolor estomacal -hinchazn de tobillos, pies o manos -sangrado inusuales Efectos secundarios que, por lo general, no requieren atencin mdica (debe informarlos a su mdico o a su profesional de la salud si persisten o si son molestos): -estreimiento -diarrea -cada del cabello -prdida del apetito -dolor estomacal Puede ser que esta lista no menciona todos los posibles efectos secundarios.  Comunquese a su mdico por asesoramiento mdico [employer] Usted puede informar los efectos secundarios a la FDA por telfono al 1-800-FDA-1088. Dnde debo guardar mi medicina? Este medicamento se administra en hospitales o clnicas y no necesitar guardarlo en su domicilio. ATENCIN: Este folleto es un resumen. Puede ser que no cubra toda la posible informacin. Si usted tiene Engineer, drilling de esta  medicina, consulte con su mdico, su farmacutico o su profesional de [employer]	[place].  2018 Elsevier/Gold Standard (2014-04-28 00:00:00)

## 2016-12-25 NOTE — Progress Notes (Signed)
[facility] day:  12/25/2016    Chief Complaint: Melinda Tanner is a 46 y.o. female with metastatic breast cancer with brain metastasis who is seen for review of interval CT scan and discussion regarding direction of therapy.   HPI:  The patient was last seen in the medical oncology clinic on 12/21/2016.  At that time, she noted abdominal discomfort.  Liver function tests had increased.  We discussed follow-up CT scan.  Abdomen and pelvic CT on 12/22/2016 revealed slight progression of hepatic metastatic disease.  There was stable to slightly smaller periportal, celiac axis and retrocrural lymph nodes.  There was stable retroperitoneal lymph nodes.  There was stable ill-defined possible lesion in the pancreatic head.  There was a slightly larger lesion in the pancreatic body and a new lesion in the head body junction region.  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  There was a small right pleural effusion with overlying atelectasis.  During the interim, she has continued to have "malaise in the stomach". Patient notes that her urine has been "darker" over the course of the last 5 days. She denies urinary frequency, urgency, and dysuria. Patient has had intermittent lower back pain that is relieved by heat application and massage. Patient has had nausea. She has had a non-productive cough with a few post-tussive vomiting episodes. Patient is weak and fatigued. She spends a great deal of her day in the bed. Patient is napping a lot (3 x a day; 1 1/2 hours at a time). Patient states, "I get up to eat and go to the bathroom, then I go back to bed". Patient does not go out of her house much. She denies any B symptoms. She is eating well, with no significant weight loss noted.    Past Medical History:  Diagnosis Date  . Brain cancer ([place]) 2012   Met. from Breast  . Breast cancer ([place]) 2009  . Complication of anesthesia    nausea,  "drops in potassium and magnesium"  . Seizures ([place])     Past Surgical History:  Procedure Laterality Date  . BRAIN SURGERY  2012, 2106  . CESAREAN SECTION    . LAPAROSCOPIC BILATERAL SALPINGO OOPHERECTOMY Bilateral 05/18/2015   Procedure: LAPAROSCOPIC BILATERAL SALPINGO OOPHORECTOMY;  Surgeon: Will Bonnet, MD;  Location: [facility] ORS;  Service: Gynecology;  Laterality: Bilateral;  . MASTECTOMY Bilateral 2010    Family History  Problem Relation Age of Onset  . Cancer Paternal Aunt   . Cancer Paternal Uncle   . Cancer Paternal Grandfather   . Hypertension Brother   . Diabetes Paternal Grandmother   A paternal aunt had breast cancer age 27, a paternal uncle had prostate cancer, and a paternal grandfather had leukemia.  Social History:  reports that she has never smoked. She has never used smokeless tobacco. She reports that she does not drink alcohol or use drugs.  She is from [place].  She moved to [place] in 2015.  She moved to [place] in 04/2014.  She recently moved into the [place] area.  She has 2 children (boy and girl).  Her family will likely be moving to [facility] in December.  They are looking for a home.  She is going on a trip to [place] 10/18/2016 - 10/24/2016.  The patient is accompanied by the Spanish interpreter today.  Allergies:  Allergies  Allergen Reactions  . Taxol [Paclitaxel] Anaphylaxis  . Aspirin Swelling  . Penicillins  Swelling  . Zofran [Ondansetron Hcl] Nausea And Vomiting    Current Medications: Current Outpatient Prescriptions  Medication Sig Dispense Refill  . pantoprazole (PROTONIX) 20 MG tablet Take 1 tablet by mouth daily 30 tablet 3  . CALCIUM-VITAMIN D PO Take 1 tablet by mouth daily.    . capecitabine (XELODA) 150 MG tablet     . Cyanocobalamin (VITAMIN B-12 PO) Take by mouth.    . dexamethasone (DECADRON) 4 MG tablet Take 1 tablet (4 mg total) by mouth daily. (Patient not taking: Reported on 12/25/2016) 25 tablet 0  .  docusate sodium (COLACE) 100 MG capsule Take 1 tablet once or twice daily as needed for constipation while taking narcotic pain medicine (Patient not taking: Reported on 12/25/2016) 30 capsule 0  . ibuprofen (ADVIL,MOTRIN) 800 MG tablet Take 800 mg by mouth every 8 (eight) hours as needed (Takes 1/2 tablet prn).    . Multiple Vitamins-Minerals (CENTRUM ADULTS PO) Take 1 tablet by mouth daily.    Marland Kitchen omeprazole (PRILOSEC) 20 MG capsule Take 20 mg by mouth daily.  2  . potassium chloride SA (K-DUR,KLOR-CON) 20 MEQ tablet Take 1 tablet (20 mEq total) by mouth 2 (two) times daily. for 1 days then 1 pill a day x 2 days. (Patient not taking: Reported on 12/21/2016) 10 tablet 0  . promethazine (PHENERGAN) 25 MG tablet Take 1 tablet (25 mg total) by mouth every 6 (six) hours as needed for nausea or vomiting. (Patient not taking: Reported on 12/25/2016) 30 tablet 2  . traMADol (ULTRAM) 50 MG tablet Take 50 mg by mouth every 12 (twelve) hours as needed.     No current facility-administered medications for this visit.     Review of Systems:  GENERAL:  Feels weak and fatigued.  No fevers or sweats.  Weight stable. PERFORMANCE STATUS (ECOG):  1 HEENT:  No visual changes, runny nose, sore throat, mouth sores or tenderness.  Excess saliva. Lungs: No shortness of breath.  Cough.  No hemoptysis. Cardiac:  No chest pain, palpitations, orthopnea, or PND. GI:  Abdominal discomfort (see HPI).  Emesis after cough x 1.  No vomiting, diarrhea, constipation, melena or hematochezia.  GU:  Urine darker.  No urgency, frequency, dysuria or hematuria. Musculoskeletal: Low back pain.  No muscle tenderness. Extremities:  No pain or swelling. Skin:  No rashes or irritation. Neuro:  No headache, numbness or weakness, balance or coordination issues.  Endocrine:  No diabetes, thyroid issues, or night sweats.  Hot flashes. Psych:  No mood changes, depression or anxiety. Pain:  No pain pills since 10/13/2016. Review of systems:  All  other systems reviewed and found to be negative.  Physical Exam: Blood pressure 114/79, pulse (!) 106, temperature (!) 97.5 F (36.4 C), temperature source Tympanic, resp. rate 18, weight 142 lb 14.4 oz (64.8 kg), last menstrual period 03/19/2015. GENERAL:  Fatigued appearing woman sitting comfortably in the exam room in no acute distress. MENTAL STATUS:  Alert and oriented to person, place and time. HEAD: Wearing a black wrap.  Normocephalic, atraumatic, face symmetric, no Cushingoid features. EYES:  Brown eyes. Sclera slightly icteric.  Pupils equal round and reactive to light and accomodation.  No conjunctivitis. ENT:  Oropharynx clear without lesion.  Tongue normal. Mucous membranes moist.  RESPIRATORY:  Decreased breath sounds right base (chronic).  Clear to auscultation without rales, wheezes or rhonchi. CARDIOVASCULAR:  Regular rate and rhythm without murmur, rub or gallop. ABDOMEN:  Soft, slightly tender upper abdomen without guarding or rebound tenderness.  Active bowel sounds.  Liver edge palpable.  No splenomegaly.  No masses. SKIN: Jaundice. No rashes, bruises or ulcers. EXTREMITIES:  No edema, no skin discoloration or tenderness.  No palpable cords. LYMPH NODES:  No palpable cervical, supraclavicular, axillary or inguinal adenopathy  NEUROLOGICAL:  Appropriate. PSYCH:  Appropriate.      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 No lymph invasion. Tumor cellularity 20%. [id number]. 07/13/2008: Left mastectomy: Residual ductal carcinoma in situ, high nuclear grade, deep margin negative ypTisNO(sn)MX. 10/25/2010: Abdominal and  pelvic ultrasound: questionable lesion near gallbladder fossa recommend MRI follow up. 03/02/2011: Left and right frontal excisional brain biopsy: Adenocarcinoma, metastatic, NOS: ER +, PR 5% +, Her 2 NEG. 03/04/2011: [facility] testing of IHC4 residual recurrence risk score of 114 showing an 8 year recurrence reate of 57%. ER POSTIVE, PR POSITIVE, HER 2 POSTIVE (previously documented negative in 2009) cutoff of 361 patient scores 426. ki67 71%.  08/05/2013: Right breast FNA supraclavicular region: positive for metastatic carcinoma. ER/PR/HER2 not reported. 08/19/2014: Right cervical lymph node. Adenocarcinoma with features consistent with metastatic breast carcinoma. ER/PR/HER2 insufficient tissue. 09/04/2014: Repeat craniotomy, resection of right frontal tumor (metastatic breast cancer). ER 90-100% PR 51-60% HER2 - 09/29/2014: Right cervical lymph node, metastatic carcinoma c/w breast primary, ER 100% PR 20% HER2-  Imaging: 08/15/2007: Bilateral mammogram: Dense breasts with solid lesion at 6:00, 7:00, and 9:00 of right breast, solid lesion at 2:00 position left breast BIRADS 4B. 09/10/2007: Breast MRI: Three solid irregular enhancing lesions of the right breast 2.1 x 3.2, 2.8 x 2.7, and 1.8 x 1.6 cm. Mildly enlarged enhancing nodule right axilla. Left breast clumped enhancement midportion suspicious for malignancy. 03/01/2011: Brain MRI: At least two juxtacortical, intra-axial masses with extensive surrounding vasogenic edema most consistent with intracranial metastasis. 07/22/2013: Brain MRI +/- contrast: interval increase in enhancing component on T2 FLAIR now measuring 1.0cm x 1.1 cm worrisome for progression of disease. 01/21/2014:  PET scan: Hypermetabolic small right supraclavicular and right upper paratracheal lymph nodes suspicious for mets. Hypermetabolic lymph node in the right paratracheal upper mediastinum that measure approximately 0.9 x 0.5cm. (of note no impressions made of areas noted  on brain MRI 07/22/13).  01/21/2014: Head MRI: Right frontal enhancing lesion has shown interval increase in size with surrounding edema and local mass effect (measured 1.8x2.1x1.6, previously 1.2x1.1x1.0) 07/16/2014: Head MRI:  Right anterior frontal enhancing mass 2.5 x 2.0 cm compatible with a metastatic lesion has increased and changed in configuration from previous 2.0 x 1.8 cm, formerly multilobular. 08/06/2014: PET scan:  Multiple bilateral FDG avid low cervical, supraclavicular, upper mediastinal, and right internal mammary lymphadenopathy, likely representing metastatic disease. A cervical node in the right lower neck should be amenable to percutaneous sampling.  12/2014: Head MRI:  Evolution of right frontal postoperative changes status post tumor resection at the vertex. Expected postoperative changes. Minimal marginal enhancement resection bed with some adjacent posterior intrinsic T1 signal again seen. Decreasing T2/FLAIR adjacent parenchymal changes.  Stable resection cavity is left posterior frontal and right parieto-occipital regions. 04/20/2015 : PET scan: Cervical and thoracic nodal metastasis.  There was multifocal osseous metastasis (left transverse T4 process, left humeral head, right iliac wing, and left side of the sacrum).  Incidental  findings included right nephrolithiasis.   04/22/2015: Bone scan: Corresponded with PET-CT with metastatic lesions evident in the left humeral head, left posterior aspect of T4, left aspect of S1, and the right iliac crest.  There was no other definite foci of metastatic disease to bone.  Uptake in the skull was most suggestive of the previous right frontal craniotomy. 05/24/2015: Bone density:  T-score of -0.8 in the right femoral neck (normal). 07/07/2015:  Head MRI:  Metastatic breast cancer with 3 resection cavities. The anterior right frontal cavity was positive for nodular marginal enhancement, new from brain MRI report 01/07/2015, and consistent  with recurrent disease.  08/12/2015:  Lumbar Spine MRI: Osseous metastatic disease at L3, L4, L5, S1, and in the left iliac bone posteriorly. There was no definite epidural metastatic disease. 08/20/2015:  PET scan:  Response to therapy in the lower cervical and thoracic adenopathy.  There was a mixed response to multifocal osseous metastases (some new lesions).  12/06/2015:  Head MRI:  Decreased nodular contrast enhancement at the right frontal lobe treatment site.  There was unchanged appearance of treatment sites within the bilateral parietal lobe size without residual or recurrent disease.  There were no new metastatic lesions. 02/18/2016: PET scan: Mixed response to therapy. There hadbeen improvement in right cervical lymphadenopathy and in several osseous lesions. There wereseveral other osseous lesions in the pelvis with increasing hypermetabolism compared to the prior study. As the majority of the osseous lesions wereincreasingly sclerotic, some of this hypermetabolism could simply reflect bony healing. There was no new extraskeletal metastatic disease is noted in the neck, chest, abdomen or pelvis. 03/06/2016: Head MRI: Unchanged small focus of nodular enhancement at the right frontal resection site. There was unchanged appearance of the 2 other resection sites without abnormal enhancement. There was no evidence of new intracranial metastases. 05/23/2016:  Bone scan:  Stable focus of abnormal uptake seen in right frontal skull consistent with prior craniotomy.  There were areas of abnormal uptake identified in the left humeral head, T4 and S1 levels and right iliac crest that were present but decreased in intensity compared to prior exam suggesting improvement.  There was a new foci of abnormal uptake are noted in a right lower rib,T12 and L1 levels of spine concerning for metastatic disease. 06/07/2016:  Thoracic and lumbar spine MRI:  multiple low T1/T2 weighted signal, non enhancing  lesions within the thoracic spine, which corresponded in size and location to sclerotic lesions demonstrated on the PET CT of 02/18/2016.  There were no new thoracic lesions.  There was minimal residual contrast enhancement within the L5 vertebral body metastatic lesion, decreased compared to the MRI of 08/12/2015.  There was minimal contrast enhancement at the periphery of the lesion within the L1 vertebral body. This lesion was new compared to MRI of 08/12/2015, but unchanged in size and location compared to PET CT of 02/18/2016.  There was no epidural disease, spinal canal stenosis or neural foraminal encroachment. 06/13/2016:  Head MRI revealed no evidence of residual or recurrent disease. 08/11/2016:  Chest, abdomen, and pelvic CT revealed  new 8 mm right supraclavicular, 12 mm right paratracheal and 10 mm subcarinal lymphadenopathy suspicious for metastatic nodal recurrence.  There was extensive patchy sclerotic osseous metastases throughout the axial and proximal appendicular skeleton appear stable in size and distribution although generally increased in sclerosis since 02/18/2016 PET-CT, which was a nonspecific change that could reflect treatment effect or progression.  There was no additional sites of metastatic disease in the chest,  abdomen or pelvis. 08/11/2016:  Bone scan revealed scattered foci of abnormal osseous tracer accumulation consistent with osseous metastatic disease with new sites of abnormal uptake in the thoracic spine at T5 in the posterior LEFT sixth rib. 08/31/2016:  Chest CT angiogram revealed interval confluent opacity in the posterior aspect of the right upper lobe suspicious for pneumonia. There was a small right-sided pleural effusion and adjacent right lower lobe atelectasis. 10/27/2016:  Head MRI revealed 9 new small brain metastases since 05/2016. The largest was a 10 mm metastasis in the posterior right cerebellum. The smallest lesions were punctate, and 1 of these along the  posterior inferior left cerebellar hemisphere was suspicious for leptomeningeal based disease.  There was no associated edema or mass effect. 11/29/2016:  Chest, abdomen, and pelvic CT revealed multiple hepatic metastasis, a 10 mm adrenal metastasis, multiple abdominal nodes, 4.2 x 2.1 cm pancreatic head mass and a separate 1.1 cm lesion in the anterior pancreatic body, new 9 mm paraesophageal lymph node, trace pericardial thickening/fluid, 2 focal lung nodules (10 mm and 5 mm) in the RUL, and an enlarging blastic lesion of T5. 12/22/2016:  Abdomen and pelvic CT revealed slight progression of hepatic metastatic disease.  There was stable to slightly smaller periportal, celiac axis and retrocrural lymph nodes.  There was stable retroperitoneal lymph nodes.  There was stable ill-defined possible lesion in the pancreatic head.  There was a slightly larger lesion in the pancreatic body and a new lesion in the head body junction region.  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  There was a small right pleural effusion with overlying atelectasis.   Labs:  Appointment on 12/25/2016  Component Date Value Ref Range Status  . WBC 12/25/2016 9.2  3.6 - 11.0 K/uL Final  . RBC 12/25/2016 3.95  3.80 - 5.20 MIL/uL Final  . Hemoglobin 12/25/2016 11.6* 12.0 - 16.0 g/dL Final  . HCT 12/25/2016 35.5  35.0 - 47.0 % Final  . MCV 12/25/2016 89.8  80.0 - 100.0 fL Final  . MCH 12/25/2016 29.3  26.0 - 34.0 pg Final  . MCHC 12/25/2016 32.6  32.0 - 36.0 g/dL Final  . RDW 12/25/2016 18.1* 11.5 - 14.5 % Final  . Platelets 12/25/2016 395  150 - 440 K/uL Final  . Neutrophils Relative % 12/25/2016 84  % Final  . Neutro Abs 12/25/2016 7.7* 1.4 - 6.5 K/uL Final  . Lymphocytes Relative 12/25/2016 5  % Final  . Lymphs Abs 12/25/2016 0.5* 1.0 - 3.6 K/uL Final  . Monocytes Relative 12/25/2016 10  % Final  . Monocytes Absolute 12/25/2016 0.9  0.2 - 0.9 K/uL Final  . Eosinophils Relative 12/25/2016 0  % Final   . Eosinophils Absolute 12/25/2016 0.0  0 - 0.7 K/uL Final  . Basophils Relative 12/25/2016 1  % Final  . Basophils Absolute 12/25/2016 0.1  0 - 0.1 K/uL Final  . Sodium 12/25/2016 132* 135 - 145 mmol/L Final  . Potassium 12/25/2016 3.4* 3.5 - 5.1 mmol/L Final  . Chloride 12/25/2016 96* 101 - 111 mmol/L Final  . CO2 12/25/2016 26  22 - 32 mmol/L Final  . Glucose, Bld 12/25/2016 118* 65 - 99 mg/dL Final  . BUN 12/25/2016 6  6 - 20 mg/dL Final  . Creatinine, Ser 12/25/2016 0.48  0.44 - 1.00 mg/dL Final  . Calcium 12/25/2016 8.6* 8.9 - 10.3 mg/dL Final  . Total Protein 12/25/2016 6.3* 6.5 - 8.1 g/dL Final  . Albumin 12/25/2016 3.1* 3.5 - 5.0 g/dL  Final  . AST 12/25/2016 191* 15 - 41 U/L Final  . ALT 12/25/2016 184* 14 - 54 U/L Final  . Alkaline Phosphatase 12/25/2016 391* 38 - 126 U/L Final  . Total Bilirubin 12/25/2016 2.8* 0.3 - 1.2 mg/dL Final  . GFR calc non Af Amer 12/25/2016 >60  >60 mL/min Final  . GFR calc Af Amer 12/25/2016 >60  >60 mL/min Final   Comment: (NOTE) The eGFR has been calculated using the CKD EPI equation. This calculation has not been validated in all clinical situations. eGFR's persistently <60 mL/min signify possible Chronic Kidney Disease.   . Anion gap 12/25/2016 10  5 - 15 Final    Assessment:  Melinda Tanner is a 46 y.o. 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  She initially presented in 2009 while living in [place] with multi-focal right breast cancer with positive lymph node(s) which was ER+, PR+(low), and HER2/neu+. She received neoadjuvant chemotherapy (AC x 4 every 3 weeks followed by Taxol/Abraxane + carboplatin weekly x 12) without anti-HER2 treatment. BRCA1/2 testing was negative on 08/15/2016.  On 07/14/2008, she underwent bilateral mastectomies followed by reconstruction.  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   Zero of 11 lymph nodes on the right were positive for  malignancy. 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 Deep margin was negative. Zero of 2 lymph nodes were positive for metastatic disease.  She received adjuvant radiation to the right breast.  She received adjuvant tamoxifen and Zoladex.  She could not tolerate symptoms of joint pain and tamoxifen was discontinued after 1-2 months.  Zoladex was continued for approximately 1.5 years.  She was diagnosed with brain metastases in 02/2011.  Pathology revealed ER+, PR+(low), and HER2/neu-.  She underwent resection followed by [facility].  On 03/09/2011, original breast tissue (09/03/2007) sent to [facility] testing.  Testing revealed ER+, PR+(borderline), and HER2/neu positive (different than initial testing).  She received Herceptin for 1 year beginning in 2013.  She then began Tykerb and Xeloda after completion of Herceptin (2014).  She was on extremely low, and probably sub-therapeutic, dosing of lapatinib + capecitabine.   She developed right supraclavicular adenopathy in 2015. She was noted to have slow disease progression in the right frontal/parietal lesion with minimal presence of systemic disease on PET scans. Capecitabine + lapatinib were discontinued in 04/2014.   She underwent repeat craniotomy with resection of brain metastasis on 09/04/2014.  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 Tumor was ER positive (> 90%) PR negative and HER-2/neu negative (1+ IHC).  She started tamoxifen in 09/2014, but discontinued it secondary to pain in hip, back, and shoulder.  She restarted tamoxifen on 04/23/2015.  Tamoxifen was discontinued on 08/23/2015 secondary to progressive disease.  She received Zoladex on 04/30/2015.  She underwent laparoscopic bilateral oophorectomy on 05/18/2015.  She receives monthly Xgeva (began 04/30/2015; last 11/13/2016).  She is post-menopausal.  She received Faslodex from  08/23/2015 - 04/04/2016.  She received 7 cycles of Ibrance (09/20/2015 - 06/18/2016).  Patient received palliative radiation 30 Gy to the left humerus and lumbar spine from 08/25/2015 - 09/09/2015.  She was not eligible for a clinical trial.  She completed palliative radiation to T12-L1 of 3000 cGy (06/21/2016 - 07/04/2016).  She received palliative radiation to the left hip from 08/17/2016 - 08/30/2016.    Xeloda was cost prohibitive.  She does not wish to pursue navelbine  or gemcitabine.  She received 3 cycles of Abraxane (10/09/2016 - 12/01/2016).  CA27.29 has been followed: 177.8 on 04/16/2015, 276.4 on 05/28/2015, 287.5 on 07/13/2015, 333.8 on 07/26/2015, 412.9 on 08/23/2015, 315.6 on 09/20/2015, 188.8 on 10/18/2015, 151.5 on 11/15/2015, 122.7 on 12/10/2015, 94.6 on 01/10/2016, 80.9 on 02/07/2016, 79 on 03/06/2016, 84.8 on 04/04/2016, 85.2 on 05/02/2016, 91.1 on 05/30/2016, 121 on 06/30/2016, 182.2 on 07/31/2016, 420.5 on 10/09/2016, 564.6 on 10/30/2016, and 1322.6 on 11/27/2016.  Head MRI on 06/13/2016 revealed no evidence of residual or recurrent disease.  Head MRI on 10/27/2016 revealed 9 new small brain metastases since 05/2016. The largest was a 10 mm metastasis in the posterior right cerebellum. The smallest lesions were punctate, and 1 of these along the posterior inferior left cerebellar hemisphere was suspicious for leptomeningeal based disease.  There was no associated edema or mass effect.  She completed whole brain radiation (11/08/2016 - 11/24/2016).  She is on a steroid taper.  Bone scan on 08/11/2016 revealed scattered foci of abnormal osseous tracer accumulation consistent with osseous metastatic disease with new sites of abnormal uptake in the thoracic spine at T5 in the posterior LEFT sixth rib.  Chest, abdomen, and pelvic CT on 11/29/2016 revealed multiple hepatic metastasis, a 10 mm adrenal metastasis, multiple abdominal nodes, 4.2 x 2.1 cm pancreatic head mass and a separate 1.1  cm lesion in the anterior pancreatic body, new 9 mm paraesophageal lymph node, trace pericardial thickening/fluid, 2 focal lung nodules (10 mm and 5 mm) in the RUL, and an enlarging blastic lesion of T5.  Abdomen and pelvic CT on 12/22/2016 revealed slight progression of hepatic metastatic disease.  There was stable to slightly smaller periportal, celiac axis and retrocrural lymph nodes.  There was stable retroperitoneal lymph nodes.  There was stable ill-defined possible lesion in the pancreatic head.  There was a slightly larger lesion in the pancreatic body and a new lesion in the head body junction region.  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  There was a small right pleural effusion with overlying atelectasis.  Bone density study on 05/24/2015 revealed a T-score of -0.8 in the right femoral neck (normal).  She is on a Fentanyl 25 mcg/hr patch.  Pain is well controlled.  Code status is FULL CODE.  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  Work-up on 10/25/2015 revealed a B12 of 141 (low).  B12 was 205 on 12/13/2015 with an MMA of 171 (normal) on 01/10/2016.  Ferritin was 43. Iron studies included a saturation of 13% and a TIBC of 354. TSH and folate were normal.  Reticulocyte count was 2.2%.  She declined B12 injections.  She started oral B12 1000 mcg on 11/05/2015.  She takes her B12 sporadically.  B12 level was 205 (low normal) on 12/13/2015.  Diet is good.  She denies any melena or hematochezia.  Symptomatically, she is weak and fatigued. She has a chronic cough. She has low back discomfort. Patient is spending a lot of time in bed. LFTs remain elevated. She is jaundiced today.  Bilirubin is elevated (2.8).  Plan: 1.  Labs today:  CBC with diff, CMP, CA27.29, PT/INR 2.  Discuss interval abdomen and pelvic CT scan - progressive disease.  She has had a dramatic increase in bilirubin.  Discuss imaging to ensure no evidence of  obstruction.  If biliary dilatation, consider stent placement. 3.  STAT RUQ abdominal ultrasound to assess for biliary obstruction.  4.  Discuss prognosis. If  new chemotherapy regimen is ineffective, patient has a limited life expectancy. Discussed code status. Patient notes, "if there is no hope" then she wishes to change her status to DNR/DNI.  5.  No Erubulin today. Will need to have port deaccessed after clinic.  6.  Patient to call about an appt this week for labs (CBC with diff, CMP), and chemotherapy (day 1 of cycle #1 eribulin).  Tentatively chemotherapy for Wednesday (12/27/2016). 7.  RTC on 01/02/2017 for MD assessment, labs (CBC with diff, CMP), and day 8 of cycle #1 eribulin on 01/03/2017.   Honor Loh, NP 12/25/2016, 1:31 PM   I saw and evaluated the patient, participating in the key portions of the service and reviewing pertinent diagnostic studies and records.  I reviewed the nurse practitioner's note and agree with the findings and the plan.  The assessment and plan were discussed with the patient.  Multiple questions were asked by the patient and answered.  Addendum:  I spoke with the patient and the Spanish interpreter, Bonnita Nasuti, after her RUQ ultrasound today.  There was no biliary ductal dilatation.  I spoke with the radiologist.  There is no role for interventional radiology.  Will review with GI images to confirm lack of benefit with ERCP and stent placement.  Increased bilirubin likely secondary to liver parenchymal involvement.  Discuss patient's thoughts about chemotherapy.  She declined chemotherapy today as she stated that she was too tired.  She wished to discuss with her husband.  She stated that she was unsure if she wanted treatment.  She would consider coming back on 12/27/2016.  We reviewed the plan for eribulin.  I again discussed other options (navelbine and gemcitabine).  Reviewed chemotherapy dosing with pharamacy.  Patient's Child Pugh score is 7 (encephalopathy- 1;  ascites- 1; bilirubin- 2; albumen- 2; PT- 1).  Child class is B (7-9).  Eribulin dose reduction to 0.7 mg/m2.  Over 40 minutes were spent face to face with the patient today in conjunction with the interpreter.  I spoke with Dr. Allen Norris on 12/26/2016.  He reviewed the images.  He does not feel that she would benefit from an ERCP and stent placement.   Nolon Stalls, MD 12/25/2016,1:31 PM

## 2016-12-25 NOTE — Progress Notes (Signed)
Here for follow up. Stated having darker urine (doesnt like to drink water b/c makes her nauseaous ) c/o mild pain on urinating and bilateral kidney pain  Interpretor Jacqui present to asst w evaluation.

## 2016-12-25 NOTE — Progress Notes (Signed)
DISCONTINUE ON PATHWAY REGIMEN - Breast     A cycle is every 21 days:     Capecitabine   **Always confirm dose/schedule in your pharmacy ordering system**    REASON: Other Reason PRIOR TREATMENT: [id number]: Capecitabine 1,000 mg/m2 BID D1-14, q21 Days Until Progression or Unacceptable Toxicity TREATMENT RESPONSE: Unable to Evaluate  START OFF PATHWAY REGIMEN - Breast   [id number]:Eribulin 1.4 mg/m2 D1, 8 q21 Days:   A cycle is every 21 days:     Eribulin mesylate   **Always confirm dose/schedule in your pharmacy ordering system**    Patient Characteristics: Metastatic Chemotherapy, HER2 Negative/Unknown/Equivocal, ER Positive, Second Line, Prior Paclitaxel Therapeutic Status: Distant Metastases BRCA Mutation Status: Awaiting Test Results ER Status: Positive (+) HER2 Status: Negative (-) Would you be surprised if this patient died  in the next year<= I would be surprised if this patient died in the next year PR Status: Negative (-) Line of therapy: Second Line Intent of Therapy: Non-Curative / Palliative Intent, Discussed with Patient

## 2016-12-26 ENCOUNTER — Encounter: Payer: Self-pay | Admitting: Hematology and Oncology

## 2016-12-26 ENCOUNTER — Ambulatory Visit: Payer: Medicare HMO | Admitting: Hematology and Oncology

## 2016-12-26 ENCOUNTER — Other Ambulatory Visit: Payer: Medicare HMO

## 2016-12-26 LAB — CA 27.29 (SERIAL MONITOR): CA 27.29: 3170.6 U/mL — ABNORMAL HIGH (ref 0.0–38.6)

## 2016-12-27 ENCOUNTER — Ambulatory Visit: Payer: Medicare HMO | Admitting: Radiation Oncology

## 2016-12-27 ENCOUNTER — Inpatient Hospital Stay: Payer: Medicare HMO

## 2016-12-27 ENCOUNTER — Other Ambulatory Visit: Payer: Self-pay | Admitting: Hematology and Oncology

## 2016-12-27 ENCOUNTER — Other Ambulatory Visit: Payer: Self-pay | Admitting: Urgent Care

## 2016-12-27 VITALS — BP 111/79 | HR 102 | Temp 97.9°F | Resp 20

## 2016-12-27 DIAGNOSIS — C50919 Malignant neoplasm of unspecified site of unspecified female breast: Secondary | ICD-10-CM

## 2016-12-27 DIAGNOSIS — C7970 Secondary malignant neoplasm of unspecified adrenal gland: Secondary | ICD-10-CM | POA: Diagnosis not present

## 2016-12-27 DIAGNOSIS — R918 Other nonspecific abnormal finding of lung field: Secondary | ICD-10-CM | POA: Diagnosis not present

## 2016-12-27 DIAGNOSIS — C7931 Secondary malignant neoplasm of brain: Secondary | ICD-10-CM | POA: Diagnosis not present

## 2016-12-27 DIAGNOSIS — Z5111 Encounter for antineoplastic chemotherapy: Secondary | ICD-10-CM | POA: Diagnosis not present

## 2016-12-27 DIAGNOSIS — D649 Anemia, unspecified: Secondary | ICD-10-CM | POA: Diagnosis not present

## 2016-12-27 DIAGNOSIS — Z853 Personal history of malignant neoplasm of breast: Secondary | ICD-10-CM

## 2016-12-27 DIAGNOSIS — C50911 Malignant neoplasm of unspecified site of right female breast: Secondary | ICD-10-CM | POA: Diagnosis not present

## 2016-12-27 DIAGNOSIS — E876 Hypokalemia: Secondary | ICD-10-CM | POA: Diagnosis not present

## 2016-12-27 DIAGNOSIS — C7951 Secondary malignant neoplasm of bone: Secondary | ICD-10-CM | POA: Diagnosis not present

## 2016-12-27 DIAGNOSIS — Z170 Estrogen receptor positive status [ER+]: Secondary | ICD-10-CM | POA: Diagnosis not present

## 2016-12-27 DIAGNOSIS — C787 Secondary malignant neoplasm of liver and intrahepatic bile duct: Secondary | ICD-10-CM | POA: Diagnosis not present

## 2016-12-27 LAB — COMPREHENSIVE METABOLIC PANEL
ALT: 175 U/L — ABNORMAL HIGH (ref 14–54)
AST: 185 U/L — ABNORMAL HIGH (ref 15–41)
Albumin: 3.1 g/dL — ABNORMAL LOW (ref 3.5–5.0)
Alkaline Phosphatase: 427 U/L — ABNORMAL HIGH (ref 38–126)
Anion gap: 12 (ref 5–15)
BUN: 6 mg/dL (ref 6–20)
CO2: 25 mmol/L (ref 22–32)
Calcium: 9 mg/dL (ref 8.9–10.3)
Chloride: 94 mmol/L — ABNORMAL LOW (ref 101–111)
Creatinine, Ser: 0.51 mg/dL (ref 0.44–1.00)
GFR calc Af Amer: >60 mL/min (ref >60)
GFR calc non Af Amer: >60 mL/min (ref >60)
Glucose, Bld: 125 mg/dL — ABNORMAL HIGH (ref 65–99)
Potassium: 4 mmol/L (ref 3.5–5.1)
Sodium: 131 mmol/L — ABNORMAL LOW (ref 135–145)
Total Bilirubin: 3.5 mg/dL — ABNORMAL HIGH (ref 0.3–1.2)
Total Protein: 7.2 g/dL (ref 6.5–8.1)

## 2016-12-27 LAB — CBC WITH DIFFERENTIAL/PLATELET
Basophils Absolute: 0.1 10*3/uL (ref 0–0.1)
Basophils Relative: 1 %
Eosinophils Absolute: 0 10*3/uL (ref 0–0.7)
Eosinophils Relative: 0 %
HCT: 35.1 % (ref 35.0–47.0)
Hemoglobin: 11.4 g/dL — ABNORMAL LOW (ref 12.0–16.0)
Lymphocytes Relative: 4 %
Lymphs Abs: 0.5 10*3/uL — ABNORMAL LOW (ref 1.0–3.6)
MCH: 29.3 pg (ref 26.0–34.0)
MCHC: 32.4 g/dL (ref 32.0–36.0)
MCV: 90.5 fL (ref 80.0–100.0)
Monocytes Absolute: 1.1 10*3/uL — ABNORMAL HIGH (ref 0.2–0.9)
Monocytes Relative: 10 %
Neutro Abs: 9.9 10*3/uL — ABNORMAL HIGH (ref 1.4–6.5)
Neutrophils Relative %: 85 %
Platelets: 402 10*3/uL (ref 150–440)
RBC: 3.88 MIL/uL (ref 3.80–5.20)
RDW: 18.7 % — ABNORMAL HIGH (ref 11.5–14.5)
WBC: 11.7 10*3/uL — ABNORMAL HIGH (ref 3.6–11.0)

## 2016-12-27 MED ORDER — DEXAMETHASONE SODIUM PHOSPHATE 10 MG/ML IJ SOLN
10.0000 mg | Freq: Once | INTRAMUSCULAR | Status: AC
  Administered 2016-12-27: 10 mg via INTRAVENOUS
  Filled 2016-12-27: qty 1

## 2016-12-27 MED ORDER — TRAMADOL HCL 50 MG PO TABS: 50.0000 mg | ORAL_TABLET | Freq: Two times a day (BID) | ORAL | 0 refills | Status: DC | PRN

## 2016-12-27 MED ORDER — LORAZEPAM 0.5 MG PO TABS: ORAL_TABLET | ORAL | 0 refills | Status: DC

## 2016-12-27 MED ORDER — SODIUM CHLORIDE 0.9 % IV SOLN
Freq: Once | INTRAVENOUS | Status: AC
  Administered 2016-12-27: 13:00:00 via INTRAVENOUS
  Filled 2016-12-27: qty 1000

## 2016-12-27 MED ORDER — SODIUM CHLORIDE 0.9 % IV SOLN
0.7000 mg/m2 | Freq: Once | INTRAVENOUS | Status: AC
  Administered 2016-12-27: 1.2 mg via INTRAVENOUS
  Filled 2016-12-27: qty 2.4

## 2016-12-27 MED ORDER — PROMETHAZINE HCL 25 MG PO TABS: 25.0000 mg | ORAL_TABLET | Freq: Once | ORAL | Status: DC

## 2016-12-27 MED ORDER — HEPARIN SOD (PORK) LOCK FLUSH 100 UNIT/ML IV SOLN
500.0000 [IU] | Freq: Once | INTRAVENOUS | Status: AC | PRN
  Administered 2016-12-27: 500 [IU]
  Filled 2016-12-27: qty 5

## 2016-12-27 MED ORDER — DEXAMETHASONE SODIUM PHOSPHATE 100 MG/10ML IJ SOLN: 10.0000 mg | Freq: Once | INTRAMUSCULAR | Status: DC

## 2016-12-27 MED ORDER — SODIUM CHLORIDE 0.9% FLUSH
10.0000 mL | INTRAVENOUS | Status: DC | PRN
  Administered 2016-12-27: 10 mL
  Filled 2016-12-27: qty 10

## 2016-12-27 NOTE — Progress Notes (Signed)
Total Bilirubin: 3.5, AST: 185, ALT: 175. NP, Gaspar Bidding, and MD, Dr. Mike Gip, notified via telephone. Per MD order: Proceed with scheduled treatment today.

## 2016-12-28 LAB — CANCER ANTIGEN 27.29: CA 27.29: 3568.2 U/mL — ABNORMAL HIGH (ref 0.0–38.6)

## 2016-12-29 ENCOUNTER — Inpatient Hospital Stay: Payer: Medicare HMO | Admitting: Hematology and Oncology

## 2016-12-29 ENCOUNTER — Inpatient Hospital Stay: Payer: Medicare HMO

## 2017-01-02 ENCOUNTER — Inpatient Hospital Stay: Payer: Medicare HMO

## 2017-01-02 ENCOUNTER — Inpatient Hospital Stay (HOSPITAL_BASED_OUTPATIENT_CLINIC_OR_DEPARTMENT_OTHER): Payer: Medicare HMO | Admitting: Hematology and Oncology

## 2017-01-02 ENCOUNTER — Encounter: Payer: Self-pay | Admitting: Hematology and Oncology

## 2017-01-02 ENCOUNTER — Other Ambulatory Visit: Payer: Self-pay | Admitting: Urgent Care

## 2017-01-02 ENCOUNTER — Other Ambulatory Visit: Payer: Self-pay

## 2017-01-02 VITALS — BP 102/71 | HR 123 | Temp 98.3°F | Resp 20 | Ht 63.0 in | Wt 142.0 lb

## 2017-01-02 DIAGNOSIS — C50911 Malignant neoplasm of unspecified site of right female breast: Secondary | ICD-10-CM

## 2017-01-02 DIAGNOSIS — Z5111 Encounter for antineoplastic chemotherapy: Secondary | ICD-10-CM

## 2017-01-02 DIAGNOSIS — E876 Hypokalemia: Secondary | ICD-10-CM | POA: Diagnosis not present

## 2017-01-02 DIAGNOSIS — Z170 Estrogen receptor positive status [ER+]: Secondary | ICD-10-CM

## 2017-01-02 DIAGNOSIS — C787 Secondary malignant neoplasm of liver and intrahepatic bile duct: Secondary | ICD-10-CM

## 2017-01-02 DIAGNOSIS — E538 Deficiency of other specified B group vitamins: Secondary | ICD-10-CM

## 2017-01-02 DIAGNOSIS — R918 Other nonspecific abnormal finding of lung field: Secondary | ICD-10-CM

## 2017-01-02 DIAGNOSIS — D72819 Decreased white blood cell count, unspecified: Secondary | ICD-10-CM

## 2017-01-02 DIAGNOSIS — C7970 Secondary malignant neoplasm of unspecified adrenal gland: Secondary | ICD-10-CM

## 2017-01-02 DIAGNOSIS — Z79899 Other long term (current) drug therapy: Secondary | ICD-10-CM

## 2017-01-02 DIAGNOSIS — D649 Anemia, unspecified: Secondary | ICD-10-CM | POA: Diagnosis not present

## 2017-01-02 DIAGNOSIS — C50919 Malignant neoplasm of unspecified site of unspecified female breast: Secondary | ICD-10-CM

## 2017-01-02 DIAGNOSIS — C7801 Secondary malignant neoplasm of right lung: Secondary | ICD-10-CM

## 2017-01-02 DIAGNOSIS — Z923 Personal history of irradiation: Secondary | ICD-10-CM

## 2017-01-02 DIAGNOSIS — Z7189 Other specified counseling: Secondary | ICD-10-CM

## 2017-01-02 DIAGNOSIS — C7951 Secondary malignant neoplasm of bone: Secondary | ICD-10-CM | POA: Diagnosis not present

## 2017-01-02 DIAGNOSIS — R000 Tachycardia, unspecified: Secondary | ICD-10-CM

## 2017-01-02 DIAGNOSIS — Z9013 Acquired absence of bilateral breasts and nipples: Secondary | ICD-10-CM

## 2017-01-02 DIAGNOSIS — R740 Nonspecific elevation of levels of transaminase and lactic acid dehydrogenase [LDH]: Secondary | ICD-10-CM

## 2017-01-02 DIAGNOSIS — C7931 Secondary malignant neoplasm of brain: Secondary | ICD-10-CM

## 2017-01-02 DIAGNOSIS — K869 Disease of pancreas, unspecified: Secondary | ICD-10-CM | POA: Diagnosis not present

## 2017-01-02 DIAGNOSIS — J90 Pleural effusion, not elsewhere classified: Secondary | ICD-10-CM

## 2017-01-02 DIAGNOSIS — Z853 Personal history of malignant neoplasm of breast: Secondary | ICD-10-CM

## 2017-01-02 DIAGNOSIS — G893 Neoplasm related pain (acute) (chronic): Secondary | ICD-10-CM

## 2017-01-02 DIAGNOSIS — C7889 Secondary malignant neoplasm of other digestive organs: Secondary | ICD-10-CM

## 2017-01-02 LAB — CBC WITH DIFFERENTIAL/PLATELET
Basophils Absolute: 0 10*3/uL (ref 0–0.1)
Basophils Relative: 2 %
Eosinophils Absolute: 0 10*3/uL (ref 0–0.7)
Eosinophils Relative: 1 %
HCT: 33.2 % — ABNORMAL LOW (ref 35.0–47.0)
Hemoglobin: 10.9 g/dL — ABNORMAL LOW (ref 12.0–16.0)
Lymphocytes Relative: 10 %
Lymphs Abs: 0.3 10*3/uL — ABNORMAL LOW (ref 1.0–3.6)
MCH: 29.5 pg (ref 26.0–34.0)
MCHC: 33 g/dL (ref 32.0–36.0)
MCV: 89.2 fL (ref 80.0–100.0)
Monocytes Absolute: 0.2 10*3/uL (ref 0.2–0.9)
Monocytes Relative: 6 %
Neutro Abs: 2.6 10*3/uL (ref 1.4–6.5)
Neutrophils Relative %: 81 %
Platelets: 349 10*3/uL (ref 150–440)
RBC: 3.72 MIL/uL — ABNORMAL LOW (ref 3.80–5.20)
RDW: 18.8 % — ABNORMAL HIGH (ref 11.5–14.5)
WBC: 3.2 10*3/uL — ABNORMAL LOW (ref 3.6–11.0)

## 2017-01-02 LAB — COMPREHENSIVE METABOLIC PANEL
ALT: 125 U/L — ABNORMAL HIGH (ref 14–54)
AST: 141 U/L — ABNORMAL HIGH (ref 15–41)
Albumin: 2.7 g/dL — ABNORMAL LOW (ref 3.5–5.0)
Alkaline Phosphatase: 412 U/L — ABNORMAL HIGH (ref 38–126)
Anion gap: 11 (ref 5–15)
BUN: 7 mg/dL (ref 6–20)
CO2: 24 mmol/L (ref 22–32)
Calcium: 9.1 mg/dL (ref 8.9–10.3)
Chloride: 97 mmol/L — ABNORMAL LOW (ref 101–111)
Creatinine, Ser: 0.46 mg/dL (ref 0.44–1.00)
GFR calc Af Amer: >60 mL/min (ref >60)
GFR calc non Af Amer: >60 mL/min (ref >60)
Glucose, Bld: 186 mg/dL — ABNORMAL HIGH (ref 65–99)
Potassium: 3.2 mmol/L — ABNORMAL LOW (ref 3.5–5.1)
Sodium: 132 mmol/L — ABNORMAL LOW (ref 135–145)
Total Bilirubin: 2.9 mg/dL — ABNORMAL HIGH (ref 0.3–1.2)
Total Protein: 7 g/dL (ref 6.5–8.1)

## 2017-01-02 LAB — PROTIME-INR
INR: 1.02
Prothrombin Time: 13.3 seconds (ref 11.4–15.2)

## 2017-01-02 MED ORDER — POTASSIUM CHLORIDE CRYS ER 20 MEQ PO TBCR: 20.0000 meq | EXTENDED_RELEASE_TABLET | Freq: Every day | ORAL | 1 refills | Status: AC

## 2017-01-02 NOTE — Progress Notes (Signed)
Patient here for follow up. See Follow  up for assessment.

## 2017-01-02 NOTE — Progress Notes (Signed)
BP re taken  Lying 103/74  p-118, sitting 100/72  p -120, Standing  116/85, p-123   Dr Mike Gip notified

## 2017-01-02 NOTE — Progress Notes (Signed)
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 (7-9).  She received eribulin 0.7 mg/m2.  Bone density study on 05/24/2015 revealed a T-score of -0.8 in the right femoral neck (normal).  She is on a Fentanyl 25 mcg/hr patch.  Pain is well controlled.  Code status is FULL CODE.  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  Work-up on 10/25/2015 revealed a B12 of 141 (low).  B12 was 205 on 12/13/2015 with an MMA of 171 (normal) on 01/10/2016.  Ferritin was 43. Iron studies included a saturation of 13% and a TIBC of 354. TSH and folate were normal.  Reticulocyte count was 2.2%.  She declined B12 injections.  She started oral B12 1000 mcg on 11/05/2015.  She takes her B12 sporadically.  B12 level was 205 (low normal) on 12/13/2015.  Diet is good.  She denies any melena or hematochezia.  Symptomatically, she is weak and fatigued. She has nausea and some vomiting post chemotherapy. She has pain in her shoulders. She is eating well and not losing weight. She continues to spend most of the day in bed. Her back pain has resolved. LFTs remain elevated, but improved.  Bilirubin is  elevated (2.9).  She has hypokalemia (3.2).  Plan: 1.  Labs today:  CBC with diff, CMP,  PT/INR. 2.  Discuss nausea. Patient using Benadryl, which is not effective. She was advised to hold Phenergan. Patient to pick up prescribed Ativan and use as instructed. 3.  Discuss leukopenia. WBC has dropped to 3,200 ([place] 2600) from 11,700 ([place] 9900) last week.  Discuss concern for neutropenia given significant drop in counts in 1 week.  Neutropenia associated with eribulin is 63% to 82% (grade 4: 29% grades 3/4: 12% to 57%) with nadir in 13 days and recovery in 8 days.  Discuss increased risk of neutropenia based on prior treatment and increased liver function tests.  Discuss preauth of OnPro Neulasta given risk of neutropenia, general fatigue, and assistance needed for patient to come to clinic.  Discuss the use of Claritin to reduce bone pain associated these injections.  4.  Discuss TACHYcardia. Heart rate in the 120s. Checked orthostatic VS; not orthostatic. EKG revealed ST at a rate of 117 with no evidence of ST segment of elevation or depression; QTc 429. 5.  Discuss symptoms associated with gas. Recommended Mylicon or Gas-X.    6.  Rx:  potassium chloride 20 meq po q day x 3. 7.  RTC as scheduled on 01/03/2017 for day 8 of cycle # 1 eribulin.  8.  RTC in 1 week for labs (CBC with diff, BMP). 9.  RTC on 01/16/2017 for MD assessment, labs (CBC with diff, CMP, Mg, CA27.29), and day 1 of cycle # 2 Eribulin.    Honor Loh, NP 01/02/2017, 11:39 AM   I saw and evaluated the patient, participating in the key portions of the service and reviewing pertinent diagnostic studies and records.  I reviewed the nurse practitioner's note and agree with the findings and the plan.  The assessment and plan were discussed with the patient.  Multiple questions were asked by the patient and answered.  Reviewed chemotherapy dosing with pharamacy.  Patient's Child Pugh score is 8 (encephalopathy- 1; ascites- 1; bilirubin- 2;  albumen- 3; PT- 1).  Child class is B (7-9).  Eribulin dose reduction to 0.7 mg/m2.   Nolon Stalls, MD 01/02/2017,11:39 AM

## 2017-01-03 ENCOUNTER — Telehealth: Payer: Self-pay | Admitting: *Deleted

## 2017-01-03 ENCOUNTER — Other Ambulatory Visit: Payer: Self-pay | Admitting: Hematology and Oncology

## 2017-01-03 ENCOUNTER — Inpatient Hospital Stay: Payer: Medicare HMO

## 2017-01-03 VITALS — BP 118/78 | HR 121 | Temp 98.7°F | Resp 20

## 2017-01-03 DIAGNOSIS — E876 Hypokalemia: Secondary | ICD-10-CM | POA: Diagnosis not present

## 2017-01-03 DIAGNOSIS — Z170 Estrogen receptor positive status [ER+]: Secondary | ICD-10-CM | POA: Diagnosis not present

## 2017-01-03 DIAGNOSIS — C787 Secondary malignant neoplasm of liver and intrahepatic bile duct: Secondary | ICD-10-CM | POA: Diagnosis not present

## 2017-01-03 DIAGNOSIS — D649 Anemia, unspecified: Secondary | ICD-10-CM | POA: Diagnosis not present

## 2017-01-03 DIAGNOSIS — C7931 Secondary malignant neoplasm of brain: Secondary | ICD-10-CM | POA: Diagnosis not present

## 2017-01-03 DIAGNOSIS — R918 Other nonspecific abnormal finding of lung field: Secondary | ICD-10-CM | POA: Diagnosis not present

## 2017-01-03 DIAGNOSIS — C50919 Malignant neoplasm of unspecified site of unspecified female breast: Secondary | ICD-10-CM

## 2017-01-03 DIAGNOSIS — Z5111 Encounter for antineoplastic chemotherapy: Secondary | ICD-10-CM | POA: Diagnosis not present

## 2017-01-03 DIAGNOSIS — C7951 Secondary malignant neoplasm of bone: Secondary | ICD-10-CM | POA: Diagnosis not present

## 2017-01-03 DIAGNOSIS — C7970 Secondary malignant neoplasm of unspecified adrenal gland: Secondary | ICD-10-CM | POA: Diagnosis not present

## 2017-01-03 DIAGNOSIS — C50911 Malignant neoplasm of unspecified site of right female breast: Secondary | ICD-10-CM | POA: Diagnosis not present

## 2017-01-03 MED ORDER — SODIUM CHLORIDE 0.9 % IV SOLN
0.7000 mg/m2 | Freq: Once | INTRAVENOUS | Status: AC
  Administered 2017-01-03: 1.2 mg via INTRAVENOUS
  Filled 2017-01-03: qty 2.4

## 2017-01-03 MED ORDER — SODIUM CHLORIDE 0.9 % IV SOLN: 10.0000 mg | Freq: Once | INTRAVENOUS | Status: DC

## 2017-01-03 MED ORDER — SODIUM CHLORIDE 0.9% FLUSH
10.0000 mL | INTRAVENOUS | Status: DC | PRN
  Administered 2017-01-03: 10 mL
  Filled 2017-01-03: qty 10

## 2017-01-03 MED ORDER — PEGFILGRASTIM 6 MG/0.6ML ~~LOC~~ PSKT
6.0000 mg | PREFILLED_SYRINGE | Freq: Once | SUBCUTANEOUS | Status: AC
  Administered 2017-01-03: 6 mg via SUBCUTANEOUS
  Filled 2017-01-03: qty 0.6

## 2017-01-03 MED ORDER — DEXAMETHASONE SODIUM PHOSPHATE 10 MG/ML IJ SOLN
10.0000 mg | Freq: Once | INTRAMUSCULAR | Status: AC
  Administered 2017-01-03: 10 mg via INTRAVENOUS
  Filled 2017-01-03: qty 1

## 2017-01-03 MED ORDER — HEPARIN SOD (PORK) LOCK FLUSH 100 UNIT/ML IV SOLN
500.0000 [IU] | Freq: Once | INTRAVENOUS | Status: AC | PRN
  Administered 2017-01-03: 500 [IU]
  Filled 2017-01-03: qty 5

## 2017-01-03 MED ORDER — SODIUM CHLORIDE 0.9 % IV SOLN
Freq: Once | INTRAVENOUS | Status: AC
  Administered 2017-01-03: 10:00:00 via INTRAVENOUS
  Filled 2017-01-03: qty 1000

## 2017-01-03 NOTE — Progress Notes (Signed)
Per Dr. Mike Gip, patient receiving treatment today of Eribulin, she reviewed labs and vital signs to include heart rate.

## 2017-01-03 NOTE — Telephone Encounter (Signed)
RN received Incoming call from [place] in [employer]. [place] - pt's neulasta injection has not been approved yet by insurance. Calling to clarify whether to proceed with chemo tx today given PA situation for neulasta.  Per Gaspar Bidding, NP- this has already been handled.

## 2017-01-06 ENCOUNTER — Encounter: Payer: Self-pay | Admitting: Hematology and Oncology

## 2017-01-08 ENCOUNTER — Encounter: Payer: Self-pay | Admitting: Urgent Care

## 2017-01-08 NOTE — Progress Notes (Signed)
Received communication from [facility] that they would not be able to complete the add on PDL-1 due to insufficient tissue. I reached out to pathologist here Reuel Derby, MD) who advised that the only tissue that they had on site had already been cut, unfortunately making it insufficient as well. Pathology results form other facilities reviewed. Patient has had biopsies done at [facility]. Request sent for slides today (01/08/2017). Will forward to pathology lab here when received. Dr. Mike Gip is aware.

## 2017-01-09 ENCOUNTER — Inpatient Hospital Stay: Payer: Medicare HMO

## 2017-01-09 DIAGNOSIS — C787 Secondary malignant neoplasm of liver and intrahepatic bile duct: Secondary | ICD-10-CM | POA: Diagnosis not present

## 2017-01-09 DIAGNOSIS — C7951 Secondary malignant neoplasm of bone: Secondary | ICD-10-CM | POA: Diagnosis not present

## 2017-01-09 DIAGNOSIS — Z5111 Encounter for antineoplastic chemotherapy: Secondary | ICD-10-CM | POA: Diagnosis not present

## 2017-01-09 DIAGNOSIS — E876 Hypokalemia: Secondary | ICD-10-CM | POA: Diagnosis not present

## 2017-01-09 DIAGNOSIS — C50919 Malignant neoplasm of unspecified site of unspecified female breast: Secondary | ICD-10-CM

## 2017-01-09 DIAGNOSIS — D649 Anemia, unspecified: Secondary | ICD-10-CM | POA: Diagnosis not present

## 2017-01-09 DIAGNOSIS — R918 Other nonspecific abnormal finding of lung field: Secondary | ICD-10-CM | POA: Diagnosis not present

## 2017-01-09 DIAGNOSIS — C50911 Malignant neoplasm of unspecified site of right female breast: Secondary | ICD-10-CM | POA: Diagnosis not present

## 2017-01-09 DIAGNOSIS — C7931 Secondary malignant neoplasm of brain: Secondary | ICD-10-CM | POA: Diagnosis not present

## 2017-01-09 DIAGNOSIS — Z803 Family history of malignant neoplasm of breast: Secondary | ICD-10-CM

## 2017-01-09 DIAGNOSIS — C7970 Secondary malignant neoplasm of unspecified adrenal gland: Secondary | ICD-10-CM | POA: Diagnosis not present

## 2017-01-09 DIAGNOSIS — Z170 Estrogen receptor positive status [ER+]: Secondary | ICD-10-CM | POA: Diagnosis not present

## 2017-01-09 LAB — BASIC METABOLIC PANEL
Anion gap: 16 — ABNORMAL HIGH (ref 5–15)
BUN: 7 mg/dL (ref 6–20)
CO2: 23 mmol/L (ref 22–32)
Calcium: 9.4 mg/dL (ref 8.9–10.3)
Chloride: 96 mmol/L — ABNORMAL LOW (ref 101–111)
Creatinine, Ser: 0.75 mg/dL (ref 0.44–1.00)
GFR calc Af Amer: >60 mL/min (ref >60)
GFR calc non Af Amer: >60 mL/min (ref >60)
Glucose, Bld: 178 mg/dL — ABNORMAL HIGH (ref 65–99)
Potassium: 3.5 mmol/L (ref 3.5–5.1)
Sodium: 135 mmol/L (ref 135–145)

## 2017-01-09 LAB — CBC WITH DIFFERENTIAL/PLATELET
Basophils Absolute: 0.2 10*3/uL — ABNORMAL HIGH (ref 0–0.1)
Basophils Relative: 1 %
Eosinophils Absolute: 0 10*3/uL (ref 0–0.7)
Eosinophils Relative: 0 %
HCT: 35.8 % (ref 35.0–47.0)
Hemoglobin: 11.7 g/dL — ABNORMAL LOW (ref 12.0–16.0)
Lymphocytes Relative: 5 %
Lymphs Abs: 1 10*3/uL (ref 1.0–3.6)
MCH: 29.6 pg (ref 26.0–34.0)
MCHC: 32.5 g/dL (ref 32.0–36.0)
MCV: 90.9 fL (ref 80.0–100.0)
Monocytes Absolute: 1.3 10*3/uL — ABNORMAL HIGH (ref 0.2–0.9)
Monocytes Relative: 7 %
Neutro Abs: 16.5 10*3/uL — ABNORMAL HIGH (ref 1.4–6.5)
Neutrophils Relative %: 87 %
Platelets: 319 10*3/uL (ref 150–440)
RBC: 3.95 MIL/uL (ref 3.80–5.20)
RDW: 18.9 % — ABNORMAL HIGH (ref 11.5–14.5)
WBC: 19 10*3/uL — ABNORMAL HIGH (ref 3.6–11.0)

## 2017-01-11 ENCOUNTER — Telehealth: Payer: Self-pay | Admitting: Urgent Care

## 2017-01-11 NOTE — Telephone Encounter (Signed)
[facility] to inquire the status on the requested tissue block. I spoke with Jana Half who advised that they had never received a request. I advised that the request was send initially on 10/22. She noted that there was another person in her office that could potentially be working on this request. She will ask. I will proceed with faxing the request letter again. Jana Half aware that the patient is on active treatment with [place] her at [facility] cancer center. Dr. Mike Gip made aware.

## 2017-01-12 ENCOUNTER — Encounter: Payer: Self-pay | Admitting: Urgent Care

## 2017-01-12 NOTE — Progress Notes (Signed)
[employer] package delivered to cancer center from [facility] pathology lab. Sealed packed delivered to the pathology lab here at [facility] by this NP. Packed delivered to Dr. Quay Burow for further review and submission of PDL-1 testing. Dr. Mike Gip made aware.

## 2017-01-15 DIAGNOSIS — C7889 Secondary malignant neoplasm of other digestive organs: Secondary | ICD-10-CM | POA: Diagnosis not present

## 2017-01-15 DIAGNOSIS — C787 Secondary malignant neoplasm of liver and intrahepatic bile duct: Secondary | ICD-10-CM | POA: Diagnosis not present

## 2017-01-15 DIAGNOSIS — C50919 Malignant neoplasm of unspecified site of unspecified female breast: Secondary | ICD-10-CM | POA: Diagnosis not present

## 2017-01-15 DIAGNOSIS — C7801 Secondary malignant neoplasm of right lung: Secondary | ICD-10-CM | POA: Diagnosis not present

## 2017-01-16 ENCOUNTER — Inpatient Hospital Stay (HOSPITAL_BASED_OUTPATIENT_CLINIC_OR_DEPARTMENT_OTHER): Payer: Medicare HMO | Admitting: Hematology and Oncology

## 2017-01-16 ENCOUNTER — Encounter: Payer: Self-pay | Admitting: Hematology and Oncology

## 2017-01-16 ENCOUNTER — Inpatient Hospital Stay: Payer: Medicare HMO

## 2017-01-16 ENCOUNTER — Other Ambulatory Visit: Payer: Self-pay | Admitting: *Deleted

## 2017-01-16 ENCOUNTER — Other Ambulatory Visit: Payer: Self-pay | Admitting: Hematology and Oncology

## 2017-01-16 VITALS — BP 96/66 | HR 120 | Temp 98.3°F | Resp 18 | Wt 137.9 lb

## 2017-01-16 DIAGNOSIS — Z880 Allergy status to penicillin: Secondary | ICD-10-CM

## 2017-01-16 DIAGNOSIS — R42 Dizziness and giddiness: Secondary | ICD-10-CM

## 2017-01-16 DIAGNOSIS — D869 Sarcoidosis, unspecified: Secondary | ICD-10-CM

## 2017-01-16 DIAGNOSIS — Z853 Personal history of malignant neoplasm of breast: Secondary | ICD-10-CM

## 2017-01-16 DIAGNOSIS — D72819 Decreased white blood cell count, unspecified: Secondary | ICD-10-CM | POA: Diagnosis not present

## 2017-01-16 DIAGNOSIS — Z170 Estrogen receptor positive status [ER+]: Secondary | ICD-10-CM

## 2017-01-16 DIAGNOSIS — Z803 Family history of malignant neoplasm of breast: Secondary | ICD-10-CM

## 2017-01-16 DIAGNOSIS — R630 Anorexia: Secondary | ICD-10-CM

## 2017-01-16 DIAGNOSIS — C7931 Secondary malignant neoplasm of brain: Secondary | ICD-10-CM | POA: Diagnosis not present

## 2017-01-16 DIAGNOSIS — C7970 Secondary malignant neoplasm of unspecified adrenal gland: Secondary | ICD-10-CM

## 2017-01-16 DIAGNOSIS — E876 Hypokalemia: Secondary | ICD-10-CM | POA: Diagnosis not present

## 2017-01-16 DIAGNOSIS — C7801 Secondary malignant neoplasm of right lung: Secondary | ICD-10-CM

## 2017-01-16 DIAGNOSIS — C7951 Secondary malignant neoplasm of bone: Secondary | ICD-10-CM | POA: Diagnosis not present

## 2017-01-16 DIAGNOSIS — D649 Anemia, unspecified: Secondary | ICD-10-CM | POA: Diagnosis not present

## 2017-01-16 DIAGNOSIS — Z7189 Other specified counseling: Secondary | ICD-10-CM

## 2017-01-16 DIAGNOSIS — C50919 Malignant neoplasm of unspecified site of unspecified female breast: Secondary | ICD-10-CM

## 2017-01-16 DIAGNOSIS — Z8042 Family history of malignant neoplasm of prostate: Secondary | ICD-10-CM

## 2017-01-16 DIAGNOSIS — R112 Nausea with vomiting, unspecified: Secondary | ICD-10-CM

## 2017-01-16 DIAGNOSIS — J90 Pleural effusion, not elsewhere classified: Secondary | ICD-10-CM

## 2017-01-16 DIAGNOSIS — C50911 Malignant neoplasm of unspecified site of right female breast: Secondary | ICD-10-CM | POA: Diagnosis not present

## 2017-01-16 DIAGNOSIS — C787 Secondary malignant neoplasm of liver and intrahepatic bile duct: Secondary | ICD-10-CM | POA: Diagnosis not present

## 2017-01-16 DIAGNOSIS — E538 Deficiency of other specified B group vitamins: Secondary | ICD-10-CM

## 2017-01-16 DIAGNOSIS — Z923 Personal history of irradiation: Secondary | ICD-10-CM

## 2017-01-16 DIAGNOSIS — C50912 Malignant neoplasm of unspecified site of left female breast: Secondary | ICD-10-CM

## 2017-01-16 DIAGNOSIS — Z5111 Encounter for antineoplastic chemotherapy: Secondary | ICD-10-CM

## 2017-01-16 DIAGNOSIS — G893 Neoplasm related pain (acute) (chronic): Secondary | ICD-10-CM

## 2017-01-16 DIAGNOSIS — R740 Nonspecific elevation of levels of transaminase and lactic acid dehydrogenase [LDH]: Secondary | ICD-10-CM

## 2017-01-16 DIAGNOSIS — R531 Weakness: Secondary | ICD-10-CM

## 2017-01-16 DIAGNOSIS — R5383 Other fatigue: Secondary | ICD-10-CM

## 2017-01-16 DIAGNOSIS — C7889 Secondary malignant neoplasm of other digestive organs: Secondary | ICD-10-CM

## 2017-01-16 DIAGNOSIS — R110 Nausea: Secondary | ICD-10-CM

## 2017-01-16 DIAGNOSIS — Z9013 Acquired absence of bilateral breasts and nipples: Secondary | ICD-10-CM

## 2017-01-16 DIAGNOSIS — R918 Other nonspecific abnormal finding of lung field: Secondary | ICD-10-CM | POA: Diagnosis not present

## 2017-01-16 DIAGNOSIS — Z79899 Other long term (current) drug therapy: Secondary | ICD-10-CM

## 2017-01-16 DIAGNOSIS — Z780 Asymptomatic menopausal state: Secondary | ICD-10-CM

## 2017-01-16 DIAGNOSIS — R634 Abnormal weight loss: Secondary | ICD-10-CM

## 2017-01-16 DIAGNOSIS — Z806 Family history of leukemia: Secondary | ICD-10-CM

## 2017-01-16 LAB — COMPREHENSIVE METABOLIC PANEL
ALT: 38 U/L (ref 14–54)
AST: 62 U/L — ABNORMAL HIGH (ref 15–41)
Albumin: 3.3 g/dL — ABNORMAL LOW (ref 3.5–5.0)
Alkaline Phosphatase: 197 U/L — ABNORMAL HIGH (ref 38–126)
Anion gap: 10 (ref 5–15)
BUN: 6 mg/dL (ref 6–20)
CO2: 23 mmol/L (ref 22–32)
Calcium: 8.1 mg/dL — ABNORMAL LOW (ref 8.9–10.3)
Chloride: 100 mmol/L — ABNORMAL LOW (ref 101–111)
Creatinine, Ser: 0.49 mg/dL (ref 0.44–1.00)
GFR calc Af Amer: >60 mL/min (ref >60)
GFR calc non Af Amer: >60 mL/min (ref >60)
Glucose, Bld: 113 mg/dL — ABNORMAL HIGH (ref 65–99)
Potassium: 3.4 mmol/L — ABNORMAL LOW (ref 3.5–5.1)
Sodium: 133 mmol/L — ABNORMAL LOW (ref 135–145)
Total Bilirubin: 1.3 mg/dL — ABNORMAL HIGH (ref 0.3–1.2)
Total Protein: 6.6 g/dL (ref 6.5–8.1)

## 2017-01-16 LAB — CBC WITH DIFFERENTIAL/PLATELET
Basophils Absolute: 0.1 10*3/uL (ref 0–0.1)
Basophils Relative: 1 %
Eosinophils Absolute: 0 10*3/uL (ref 0–0.7)
Eosinophils Relative: 0 %
HCT: 33.9 % — ABNORMAL LOW (ref 35.0–47.0)
Hemoglobin: 11.1 g/dL — ABNORMAL LOW (ref 12.0–16.0)
Lymphocytes Relative: 6 %
Lymphs Abs: 0.9 10*3/uL — ABNORMAL LOW (ref 1.0–3.6)
MCH: 29.8 pg (ref 26.0–34.0)
MCHC: 32.7 g/dL (ref 32.0–36.0)
MCV: 91.2 fL (ref 80.0–100.0)
Monocytes Absolute: 0.8 10*3/uL (ref 0.2–0.9)
Monocytes Relative: 6 %
Neutro Abs: 13.2 10*3/uL — ABNORMAL HIGH (ref 1.4–6.5)
Neutrophils Relative %: 87 %
Platelets: 219 10*3/uL (ref 150–440)
RBC: 3.71 MIL/uL — ABNORMAL LOW (ref 3.80–5.20)
RDW: 19.7 % — ABNORMAL HIGH (ref 11.5–14.5)
WBC: 15 10*3/uL — ABNORMAL HIGH (ref 3.6–11.0)

## 2017-01-16 LAB — PROTIME-INR
INR: 1.08
Prothrombin Time: 13.9 seconds (ref 11.4–15.2)

## 2017-01-16 LAB — MAGNESIUM: Magnesium: 2.1 mg/dL (ref 1.7–2.4)

## 2017-01-16 MED ORDER — SODIUM CHLORIDE 0.9 % IV SOLN
Freq: Once | INTRAVENOUS | Status: AC
  Administered 2017-01-16: 15:00:00 via INTRAVENOUS
  Filled 2017-01-16: qty 1000

## 2017-01-16 MED ORDER — MEGESTROL ACETATE 400 MG/10ML PO SUSP: 200.0000 mg | Freq: Every day | ORAL | 0 refills | Status: DC

## 2017-01-16 MED ORDER — HEPARIN SOD (PORK) LOCK FLUSH 100 UNIT/ML IV SOLN
500.0000 [IU] | Freq: Once | INTRAVENOUS | Status: AC | PRN
  Administered 2017-01-16: 500 [IU]

## 2017-01-16 MED ORDER — PROMETHAZINE HCL 12.5 MG PO TABS: 12.5000 mg | ORAL_TABLET | Freq: Four times a day (QID) | ORAL | 0 refills | Status: AC | PRN

## 2017-01-16 MED ORDER — SODIUM CHLORIDE 0.9 % IV SOLN
0.7000 mg/m2 | Freq: Once | INTRAVENOUS | Status: AC
  Administered 2017-01-16: 1.2 mg via INTRAVENOUS
  Filled 2017-01-16: qty 2.4

## 2017-01-16 MED ORDER — DEXAMETHASONE SODIUM PHOSPHATE 10 MG/ML IJ SOLN
10.0000 mg | Freq: Once | INTRAMUSCULAR | Status: AC
  Administered 2017-01-16: 10 mg via INTRAVENOUS
  Filled 2017-01-16: qty 1

## 2017-01-16 MED ORDER — SODIUM CHLORIDE 0.9% FLUSH
10.0000 mL | INTRAVENOUS | Status: DC | PRN
  Administered 2017-01-16: 10 mL via INTRAVENOUS
  Filled 2017-01-16: qty 10

## 2017-01-16 MED ORDER — POTASSIUM CHLORIDE 20 MEQ/100ML IV SOLN
20.0000 meq | Freq: Once | INTRAVENOUS | Status: AC
  Administered 2017-01-16: 20 meq via INTRAVENOUS
  Filled 2017-01-16: qty 100

## 2017-01-16 MED ORDER — SODIUM CHLORIDE 0.9 % IV SOLN
Freq: Once | INTRAVENOUS | Status: AC
  Administered 2017-01-16: 13:00:00 via INTRAVENOUS
  Filled 2017-01-16: qty 1000

## 2017-01-16 MED ORDER — HEPARIN SOD (PORK) LOCK FLUSH 100 UNIT/ML IV SOLN: 500.0000 [IU] | Freq: Once | INTRAVENOUS | Status: DC

## 2017-01-16 MED ORDER — SODIUM CHLORIDE 0.9 % IV SOLN: 10.0000 mg | Freq: Once | INTRAVENOUS | Status: DC

## 2017-01-16 NOTE — Progress Notes (Signed)
[facility] day:  01/16/2017    Chief Complaint: Melinda Tanner is a 46 y.o. female with metastatic breast cancer with brain metastasis who is seen for assessment prior to day 1 of cycle #2 eribulin.   HPI:  The patient was last seen in the medical oncology clinic on 01/02/2017.  At that time, she was weak and fatigued. She had nausea and some vomiting post chemotherapy. She had pain in her shoulders. She was eating well and not losing weight. She spent most of the day in bed. Back pain had resolved. LFTs remained elevated, but improved.  Bilirubin was elevated (2.9).  She had hypokalemia (3.2).  She received a prescription for oral potassium.  She received day 8 eribulin at reduced dose on 01/03/2017 with On-Pro Neulasta support.  During the interim, patient has been fatigued. Patient gets "worn out" walking from the kitchen to the living room. Patient has intermittent pain in her abdomen. Patient's nausea, vomiting, and vertiginous symptoms have improved using the prescribed interventions. Patient notes that urine is more clear as compared to the last few weeks. Patient has had no B symptoms or interval infections. Patient experienced bone pain following the Neulasta injection. Patient's appetite has decreased overall. She has lost 5 pounds.    Past Medical History:  Diagnosis Date  . Brain cancer ([place]) 2012   Met. from Breast  . Breast cancer ([place]) 2009  . Complication of anesthesia    nausea, "drops in potassium and magnesium"  . Seizures ([place])     Past Surgical History:  Procedure Laterality Date  . BRAIN SURGERY  2012, 2106  . CESAREAN SECTION    . LAPAROSCOPIC BILATERAL SALPINGO OOPHERECTOMY Bilateral 05/18/2015   Procedure: LAPAROSCOPIC BILATERAL SALPINGO OOPHORECTOMY;  Surgeon: Will Bonnet, MD;  Location: [facility] ORS;  Service: Gynecology;  Laterality: Bilateral;  . MASTECTOMY Bilateral 2010    Family History  Problem  Relation Age of Onset  . Cancer Paternal Aunt   . Cancer Paternal Uncle   . Cancer Paternal Grandfather   . Hypertension Brother   . Diabetes Paternal Grandmother   A paternal aunt had breast cancer age 78, a paternal uncle had prostate cancer, and a paternal grandfather had leukemia.  Social History:  reports that she has never smoked. She has never used smokeless tobacco. She reports that she does not drink alcohol or use drugs.  She is from [place].  She moved to [place] in 2015.  She moved to [place] in 04/2014.  She recently moved into the [place] area.  She has 2 children (boy and girl).  Her family will likely be moving to [facility] in December.  They are looking for a home.  She is going on a trip to [place] 10/18/2016 - 10/24/2016.  The patient is accompanied by her husband and Leola Brazil [facility] interpreter) today.  Allergies:  Allergies  Allergen Reactions  . Taxol [Paclitaxel] Anaphylaxis  . Aspirin Swelling  . Penicillins Swelling  . Zofran [Ondansetron Hcl] Nausea And Vomiting    Current Medications: Current Outpatient Prescriptions  Medication Sig Dispense Refill  . LORazepam (ATIVAN) 0.5 MG tablet 1/2-1 tab (0.68m to 0.549m every 6 hours PRN nausea 30 tablet 0  . pantoprazole (PROTONIX) 20 MG tablet Take 1 tablet by mouth daily 30 tablet 3  . CALCIUM-VITAMIN D PO Take 1 tablet by mouth daily.    . capecitabine (XELODA) 150 MG tablet     . Cyanocobalamin (VITAMIN  B-12 PO) Take by mouth.    . dexamethasone (DECADRON) 4 MG tablet Take 1 tablet (4 mg total) by mouth daily. (Patient not taking: Reported on 12/25/2016) 25 tablet 0  . docusate sodium (COLACE) 100 MG capsule Take 1 tablet once or twice daily as needed for constipation while taking narcotic pain medicine (Patient not taking: Reported on 12/25/2016) 30 capsule 0  . ibuprofen (ADVIL,MOTRIN) 800 MG tablet Take 800 mg by mouth every 8 (eight) hours as needed (Takes 1/2 tablet prn).    . megestrol (MEGACE)  400 MG/10ML suspension Take 5 mLs (200 mg total) by mouth daily. 240 mL 0  . Multiple Vitamins-Minerals (CENTRUM ADULTS PO) Take 1 tablet by mouth daily.    Marland Kitchen omeprazole (PRILOSEC) 20 MG capsule Take 20 mg by mouth daily.  2  . potassium chloride SA (K-DUR,KLOR-CON) 20 MEQ tablet Take 1 tablet (20 mEq total) by mouth daily. (Patient not taking: Reported on 01/16/2017) 3 tablet 1  . promethazine (PHENERGAN) 12.5 MG tablet Take 1 tablet (12.5 mg total) by mouth every 6 (six) hours as needed for nausea or vomiting. 30 tablet 0  . traMADol (ULTRAM) 50 MG tablet Take 1 tablet (50 mg total) by mouth every 12 (twelve) hours as needed. (Patient not taking: Reported on 01/16/2017) 30 tablet 0   No current facility-administered medications for this visit.    Facility-Administered Medications Ordered in Other Visits  Medication Dose Route Frequency Provider Last Rate Last Dose  . heparin lock flush 100 unit/mL  500 Units Intravenous Once Corcoran, Melissa C, MD      . sodium chloride flush (NS) 0.9 % injection 10 mL  10 mL Intravenous PRN Lequita Asal, MD   10 mL at 01/16/17 1031    Review of Systems:  GENERAL:  Feels tired and sleepy.  No fevers or sweats.  Weight down 5 pounds.  PERFORMANCE STATUS (ECOG):  1 HEENT:  No visual changes, runny nose, sore throat, mouth sores or tenderness.  Excess saliva. Lungs: Shortness of breath with exertion.  No cough.  No hemoptysis. Cardiac:  No chest pain, palpitations, orthopnea, or PND. GI:  Intermittent abdominal pain.  Nausea and vomiting, improved.  Eating better.  No diarrhea, constipation, melena or hematochezia.  GU:  No urgency, frequency, dysuria or hematuria. Musculoskeletal: Low back pain, improved.  No muscle tenderness. Extremities:  No pain or swelling. Skin:  No rashes or irritation. Neuro:  No headache, numbness or weakness, balance or coordination issues.  Endocrine:  No diabetes, thyroid issues, or night sweats.  Hot flashes. Psych:   No mood changes, depression or anxiety. Pain: No focal pain.  Review of systems:  All other systems reviewed and found to be negative.  Physical Exam: Blood pressure 105/71, pulse (!) 114, temperature 98.3 F (36.8 C), temperature source Tympanic, resp. rate 18, weight 137 lb 14.4 oz (62.6 kg), last menstrual period 03/19/2015. GENERAL:  Fatigued appearing woman sitting comfortably in the exam room in no acute distress. MENTAL STATUS:  Alert and oriented to person, place and time. HEAD: Wearing a black wrap.  Normocephalic, atraumatic, face symmetric, no Cushingoid features. EYES:  Brown eyes. Sclera not icteric.  Pupils equal round and reactive to light and accomodation.  No conjunctivitis. ENT:  Oropharynx clear without lesion.  Tongue normal. Mucous membranes moist.  RESPIRATORY:  Decreased breath sounds right base (chronic).  Clear to auscultation without rales, wheezes or rhonchi. CARDIOVASCULAR:  Regular rate and rhythm without murmur, rub or gallop. ABDOMEN:  Soft,  slightly tender mid upper abdomen without guarding or rebound tenderness.  Active bowel sounds.  Liver edge palpable.  No splenomegaly.  No masses. SKIN: No rashes, bruises or ulcers. EXTREMITIES:  No edema, no skin discoloration or tenderness.  No palpable cords. LYMPH NODES:  No palpable cervical, supraclavicular, axillary or inguinal adenopathy  NEUROLOGICAL:  Appropriate. PSYCH:  Appropriate.     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 No lymph invasion. Tumor cellularity 20%. [id number]. 07/13/2008: Left mastectomy: Residual ductal carcinoma in situ, high  nuclear grade, deep margin negative ypTisNO(sn)MX. 10/25/2010: Abdominal and pelvic ultrasound: questionable lesion near gallbladder fossa recommend MRI follow up. 03/02/2011: Left and right frontal excisional brain biopsy: Adenocarcinoma, metastatic, NOS: ER +, PR 5% +, Her 2 NEG. 03/04/2011: [facility] testing of IHC4 residual recurrence risk score of 114 showing an 8 year recurrence reate of 57%. ER POSTIVE, PR POSITIVE, HER 2 POSTIVE (previously documented negative in 2009) cutoff of 361 patient scores 426. ki67 71%.  08/05/2013: Right breast FNA supraclavicular region: positive for metastatic carcinoma. ER/PR/HER2 not reported. 08/19/2014: Right cervical lymph node. Adenocarcinoma with features consistent with metastatic breast carcinoma. ER/PR/HER2 insufficient tissue. 09/04/2014: Repeat craniotomy, resection of right frontal tumor (metastatic breast cancer). ER 90-100% PR 51-60% HER2 - 09/29/2014: Right cervical lymph node, metastatic carcinoma c/w breast primary, ER 100% PR 20% HER2-  Imaging: 08/15/2007: Bilateral mammogram: Dense breasts with solid lesion at 6:00, 7:00, and 9:00 of right breast, solid lesion at 2:00 position left breast BIRADS 4B. 09/10/2007: Breast MRI: Three solid irregular enhancing lesions of the right breast 2.1 x 3.2, 2.8 x 2.7, and 1.8 x 1.6 cm. Mildly enlarged enhancing nodule right axilla. Left breast clumped enhancement midportion suspicious for malignancy. 03/01/2011: Brain MRI: At least two juxtacortical, intra-axial masses with extensive surrounding vasogenic edema most consistent with intracranial metastasis. 07/22/2013: Brain MRI +/- contrast: interval increase in enhancing component on T2 FLAIR now measuring 1.0cm x 1.1 cm worrisome for progression of disease. 01/21/2014:  PET scan: Hypermetabolic small right supraclavicular and right upper paratracheal lymph nodes suspicious for mets. Hypermetabolic lymph node in the right paratracheal upper mediastinum that  measure approximately 0.9 x 0.5cm. (of note no impressions made of areas noted on brain MRI 07/22/13).  01/21/2014: Head MRI: Right frontal enhancing lesion has shown interval increase in size with surrounding edema and local mass effect (measured 1.8x2.1x1.6, previously 1.2x1.1x1.0) 07/16/2014: Head MRI:  Right anterior frontal enhancing mass 2.5 x 2.0 cm compatible with a metastatic lesion has increased and changed in configuration from previous 2.0 x 1.8 cm, formerly multilobular. 08/06/2014: PET scan:  Multiple bilateral FDG avid low cervical, supraclavicular, upper mediastinal, and right internal mammary lymphadenopathy, likely representing metastatic disease. A cervical node in the right lower neck should be amenable to percutaneous sampling.  12/2014: Head MRI:  Evolution of right frontal postoperative changes status post tumor resection at the vertex. Expected postoperative changes. Minimal marginal enhancement resection bed with some adjacent posterior intrinsic T1 signal again seen. Decreasing T2/FLAIR adjacent parenchymal changes.  Stable resection cavity is left posterior frontal and right parieto-occipital regions. 04/20/2015 : PET scan: Cervical and thoracic nodal metastasis.  There was multifocal osseous metastasis (left transverse T4 process, left humeral head, right iliac  wing, and left side of the sacrum).  Incidental findings included right nephrolithiasis.   04/22/2015: Bone scan: Corresponded with PET-CT with metastatic lesions evident in the left humeral head, left posterior aspect of T4, left aspect of S1, and the right iliac crest.  There was no other definite foci of metastatic disease to bone.  Uptake in the skull was most suggestive of the previous right frontal craniotomy. 05/24/2015: Bone density:  T-score of -0.8 in the right femoral neck (normal). 07/07/2015:  Head MRI:  Metastatic breast cancer with 3 resection cavities. The anterior right frontal cavity was positive for nodular  marginal enhancement, new from brain MRI report 01/07/2015, and consistent with recurrent disease.  08/12/2015:  Lumbar Spine MRI: Osseous metastatic disease at L3, L4, L5, S1, and in the left iliac bone posteriorly. There was no definite epidural metastatic disease. 08/20/2015:  PET scan:  Response to therapy in the lower cervical and thoracic adenopathy.  There was a mixed response to multifocal osseous metastases (some new lesions).  12/06/2015:  Head MRI:  Decreased nodular contrast enhancement at the right frontal lobe treatment site.  There was unchanged appearance of treatment sites within the bilateral parietal lobe size without residual or recurrent disease.  There were no new metastatic lesions. 02/18/2016: PET scan: Mixed response to therapy. There hadbeen improvement in right cervical lymphadenopathy and in several osseous lesions. There wereseveral other osseous lesions in the pelvis with increasing hypermetabolism compared to the prior study. As the majority of the osseous lesions wereincreasingly sclerotic, some of this hypermetabolism could simply reflect bony healing. There was no new extraskeletal metastatic disease is noted in the neck, chest, abdomen or pelvis. 03/06/2016: Head MRI: Unchanged small focus of nodular enhancement at the right frontal resection site. There was unchanged appearance of the 2 other resection sites without abnormal enhancement. There was no evidence of new intracranial metastases. 05/23/2016:  Bone scan:  Stable focus of abnormal uptake seen in right frontal skull consistent with prior craniotomy.  There were areas of abnormal uptake identified in the left humeral head, T4 and S1 levels and right iliac crest that were present but decreased in intensity compared to prior exam suggesting improvement.  There was a new foci of abnormal uptake are noted in a right lower rib,T12 and L1 levels of spine concerning for metastatic disease. 06/07/2016:  Thoracic  and lumbar spine MRI:  multiple low T1/T2 weighted signal, non enhancing lesions within the thoracic spine, which corresponded in size and location to sclerotic lesions demonstrated on the PET CT of 02/18/2016.  There were no new thoracic lesions.  There was minimal residual contrast enhancement within the L5 vertebral body metastatic lesion, decreased compared to the MRI of 08/12/2015.  There was minimal contrast enhancement at the periphery of the lesion within the L1 vertebral body. This lesion was new compared to MRI of 08/12/2015, but unchanged in size and location compared to PET CT of 02/18/2016.  There was no epidural disease, spinal canal stenosis or neural foraminal encroachment. 06/13/2016:  Head MRI revealed no evidence of residual or recurrent disease. 08/11/2016:  Chest, abdomen, and pelvic CT revealed  new 8 mm right supraclavicular, 12 mm right paratracheal and 10 mm subcarinal lymphadenopathy suspicious for metastatic nodal recurrence.  There was extensive patchy sclerotic osseous metastases throughout the axial and proximal appendicular skeleton appear stable in size and distribution although generally increased in sclerosis since 02/18/2016 PET-CT, which was a nonspecific change that could reflect treatment effect or progression.  There was  no additional sites of metastatic disease in the chest, abdomen or pelvis. 08/11/2016:  Bone scan revealed scattered foci of abnormal osseous tracer accumulation consistent with osseous metastatic disease with new sites of abnormal uptake in the thoracic spine at T5 in the posterior LEFT sixth rib. 08/31/2016:  Chest CT angiogram revealed interval confluent opacity in the posterior aspect of the right upper lobe suspicious for pneumonia. There was a small right-sided pleural effusion and adjacent right lower lobe atelectasis. 10/27/2016:  Head MRI revealed 9 new small brain metastases since 05/2016. The largest was a 10 mm metastasis in the posterior right  cerebellum. The smallest lesions were punctate, and 1 of these along the posterior inferior left cerebellar hemisphere was suspicious for leptomeningeal based disease.  There was no associated edema or mass effect. 11/29/2016:  Chest, abdomen, and pelvic CT revealed multiple hepatic metastasis, a 10 mm adrenal metastasis, multiple abdominal nodes, 4.2 x 2.1 cm pancreatic head mass and a separate 1.1 cm lesion in the anterior pancreatic body, new 9 mm paraesophageal lymph node, trace pericardial thickening/fluid, 2 focal lung nodules (10 mm and 5 mm) in the RUL, and an enlarging blastic lesion of T5. 12/22/2016:  Abdomen and pelvic CT revealed slight progression of hepatic metastatic disease.  There was stable to slightly smaller periportal, celiac axis and retrocrural lymph nodes.  There was stable retroperitoneal lymph nodes.  There was stable ill-defined possible lesion in the pancreatic head.  There was a slightly larger lesion in the pancreatic body and a new lesion in the head body junction region.  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  There was a small right pleural effusion with overlying atelectasis.   Labs:  Infusion on 01/16/2017  Component Date Value Ref Range Status  . WBC 01/16/2017 15.0* 3.6 - 11.0 K/uL Final  . RBC 01/16/2017 3.71* 3.80 - 5.20 MIL/uL Final  . Hemoglobin 01/16/2017 11.1* 12.0 - 16.0 g/dL Final  . HCT 01/16/2017 33.9* 35.0 - 47.0 % Final  . MCV 01/16/2017 91.2  80.0 - 100.0 fL Final  . MCH 01/16/2017 29.8  26.0 - 34.0 pg Final  . MCHC 01/16/2017 32.7  32.0 - 36.0 g/dL Final  . RDW 01/16/2017 19.7* 11.5 - 14.5 % Final  . Platelets 01/16/2017 219  150 - 440 K/uL Final  . Neutrophils Relative % 01/16/2017 87  % Final  . Neutro Abs 01/16/2017 13.2* 1.4 - 6.5 K/uL Final  . Lymphocytes Relative 01/16/2017 6  % Final  . Lymphs Abs 01/16/2017 0.9* 1.0 - 3.6 K/uL Final  . Monocytes Relative 01/16/2017 6  % Final  . Monocytes Absolute 01/16/2017  0.8  0.2 - 0.9 K/uL Final  . Eosinophils Relative 01/16/2017 0  % Final  . Eosinophils Absolute 01/16/2017 0.0  0 - 0.7 K/uL Final  . Basophils Relative 01/16/2017 1  % Final  . Basophils Absolute 01/16/2017 0.1  0 - 0.1 K/uL Final  . Sodium 01/16/2017 133* 135 - 145 mmol/L Final  . Potassium 01/16/2017 3.4* 3.5 - 5.1 mmol/L Final  . Chloride 01/16/2017 100* 101 - 111 mmol/L Final  . CO2 01/16/2017 23  22 - 32 mmol/L Final  . Glucose, Bld 01/16/2017 113* 65 - 99 mg/dL Final  . BUN 01/16/2017 6  6 - 20 mg/dL Final  . Creatinine, Ser 01/16/2017 0.49  0.44 - 1.00 mg/dL Final  . Calcium 01/16/2017 8.1* 8.9 - 10.3 mg/dL Final  . Total Protein 01/16/2017 6.6  6.5 - 8.1 g/dL Final  .  Albumin 01/16/2017 3.3* 3.5 - 5.0 g/dL Final  . AST 01/16/2017 62* 15 - 41 U/L Final  . ALT 01/16/2017 38  14 - 54 U/L Final  . Alkaline Phosphatase 01/16/2017 197* 38 - 126 U/L Final  . Total Bilirubin 01/16/2017 1.3* 0.3 - 1.2 mg/dL Final  . GFR calc non Af Amer 01/16/2017 >60  >60 mL/min Final  . GFR calc Af Amer 01/16/2017 >60  >60 mL/min Final   Comment: (NOTE) The eGFR has been calculated using the CKD EPI equation. This calculation has not been validated in all clinical situations. eGFR's persistently <60 mL/min signify possible Chronic Kidney Disease.   . Anion gap 01/16/2017 10  5 - 15 Final  . Magnesium 01/16/2017 2.1  1.7 - 2.4 mg/dL Final  . Prothrombin Time 01/16/2017 13.9  11.4 - 15.2 seconds Final  . INR 01/16/2017 1.08   Final    Assessment:  Melinda Tanner is a 46 y.o. 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  She initially presented in 2009 while living in [place] with multi-focal right breast cancer with positive lymph node(s) which was ER+, PR+(low), and HER2/neu+. She received neoadjuvant chemotherapy (AC x 4 every 3 weeks followed by Taxol/Abraxane + carboplatin weekly x 12) without anti-HER2 treatment. BRCA1/2 testing was negative on 08/15/2016.  On  07/14/2008, she underwent bilateral mastectomies followed by reconstruction.  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   Zero of 11 lymph nodes on the right were positive for malignancy. 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 Deep margin was negative. Zero of 2 lymph nodes were positive for metastatic disease.  She received adjuvant radiation to the right breast.  She received adjuvant tamoxifen and Zoladex.  She could not tolerate symptoms of joint pain and tamoxifen was discontinued after 1-2 months.  Zoladex was continued for approximately 1.5 years.  She was diagnosed with brain metastases in 02/2011.  Pathology revealed ER+, PR+(low), and HER2/neu-.  She underwent resection followed by [facility].  On 03/09/2011, original breast tissue (09/03/2007) sent to [facility] testing.  Testing revealed ER+, PR+(borderline), and HER2/neu positive (different than initial testing).  She received Herceptin for 1 year beginning in 2013.  She then began Tykerb and Xeloda after completion of Herceptin (2014).  She was on extremely low, and probably sub-therapeutic, dosing of lapatinib + capecitabine.   She developed right supraclavicular adenopathy in 2015. She was noted to have slow disease progression in the right frontal/parietal lesion with minimal presence of systemic disease on PET scans. Capecitabine + lapatinib were discontinued in 04/2014.   She underwent repeat craniotomy with resection of brain metastasis on 09/04/2014.  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 Tumor was ER positive (> 90%) PR negative and HER-2/neu negative (1+ IHC).  She started tamoxifen in 09/2014, but discontinued it secondary to pain in hip, back, and shoulder.  She restarted tamoxifen on 04/23/2015.  Tamoxifen was discontinued on 08/23/2015 secondary to progressive disease.  She  received Zoladex on 04/30/2015.  She underwent laparoscopic bilateral oophorectomy on 05/18/2015.  She receives monthly Xgeva (began 04/30/2015; last 11/13/2016).  She is post-menopausal.  She received Faslodex from 08/23/2015 - 04/04/2016.  She received 7 cycles of Ibrance (09/20/2015 - 06/18/2016).  Patient received palliative radiation 30 Gy to the left humerus and lumbar spine from 08/25/2015 - 09/09/2015.  She was not eligible for a clinical trial.  She  completed palliative radiation to T12-L1 of 3000 cGy (06/21/2016 - 07/04/2016).  She received palliative radiation to the left hip from 08/17/2016 - 08/30/2016.    Xeloda was cost prohibitive.  She does not wish to pursue navelbine or gemcitabine.  She received 3 cycles of Abraxane (10/09/2016 - 12/01/2016).  CA27.29 has been followed: 177.8 on 04/16/2015, 276.4 on 05/28/2015, 287.5 on 07/13/2015, 333.8 on 07/26/2015, 412.9 on 08/23/2015, 315.6 on 09/20/2015, 188.8 on 10/18/2015, 151.5 on 11/15/2015, 122.7 on 12/10/2015, 94.6 on 01/10/2016, 80.9 on 02/07/2016, 79 on 03/06/2016, 84.8 on 04/04/2016, 85.2 on 05/02/2016, 91.1 on 05/30/2016, 121 on 06/30/2016, 182.2 on 07/31/2016, 420.5 on 10/09/2016, 564.6 on 10/30/2016, 1322.6 on 11/27/2016, 3170.6 on 12/25/2016, 3568.2 on 12/27/2016, and 1851.1 on 01/16/2017.  Head MRI on 06/13/2016 revealed no evidence of residual or recurrent disease.  Head MRI on 10/27/2016 revealed 9 new small brain metastases since 05/2016. The largest was a 10 mm metastasis in the posterior right cerebellum. The smallest lesions were punctate, and 1 of these along the posterior inferior left cerebellar hemisphere was suspicious for leptomeningeal based disease.  There was no associated edema or mass effect.  She completed whole brain radiation (11/08/2016 - 11/24/2016).  She is on a steroid taper.  Bone scan on 08/11/2016 revealed scattered foci of abnormal osseous tracer accumulation consistent with osseous metastatic disease  with new sites of abnormal uptake in the thoracic spine at T5 in the posterior LEFT sixth rib.  Chest, abdomen, and pelvic CT on 11/29/2016 revealed multiple hepatic metastasis, a 10 mm adrenal metastasis, multiple abdominal nodes, 4.2 x 2.1 cm pancreatic head mass and a separate 1.1 cm lesion in the anterior pancreatic body, new 9 mm paraesophageal lymph node, trace pericardial thickening/fluid, 2 focal lung nodules (10 mm and 5 mm) in the RUL, and an enlarging blastic lesion of T5.  Abdomen and pelvic CT on 12/22/2016 revealed slight progression of hepatic metastatic disease.  There was stable to slightly smaller periportal, celiac axis and retrocrural lymph nodes.  There was stable retroperitoneal lymph nodes.  There was stable ill-defined possible lesion in the pancreatic head.  There was a slightly larger lesion in the pancreatic body and a new lesion in the head body junction region.  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  There was a small right pleural effusion with overlying atelectasis.  She is s/p cycle #1 eribulin (12/27/2016 - 01/03/2017).  Child class was B (7-9).  She received eribulin 0.7 mg/m2.  Bone density study on 05/24/2015 revealed a T-score of -0.8 in the right femoral neck (normal).  She is on a Fentanyl 25 mcg/hr patch.  Pain is well controlled.  Code status is FULL CODE.  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  Work-up on 10/25/2015 revealed a B12 of 141 (low).  B12 was 205 on 12/13/2015 with an MMA of 171 (normal) on 01/10/2016.  Ferritin was 43. Iron studies included a saturation of 13% and a TIBC of 354. TSH and folate were normal.  Reticulocyte count was 2.2%.  She declined B12 injections.  She started oral B12 1000 mcg on 11/05/2015.  She takes her B12 sporadically.  B12 level was 205 (low normal) on 12/13/2015.  Diet is good.  She denies any melena or hematochezia.  Symptomatically, she is weak and  fatigued. Her nausea is well controlled with the prescribed Ativan.  Patient has intermittent pain in her abdomen. She has a decreased appetite and has lost 5 pounds. She  continues to spend most of the day in bed. Patient had pain in her bones after the Neulasta. LFTs have improved; AST 62, ALT 38, total bilirubin 1.3. WBC is 15, 000 with an Penelope of 13,200. Hemoglobin is 11.1, hematocrit 33.9, and platelets 219,000.  Potassium is 3.4.  Plan: 1.  Labs today:  CBC with diff, CMP, PT/INR, CA27.29. 2.  Discuss continuation of monthly Xgeva. Patient not taking oral calcium. Encouraged to restart supplement so that she can have injection with the next treatment. 3.  Discuss poor nutritional intake and weight loss. Patient using 1/2 bottle of Ensure daily. She was encouraged to increased to 3 full bottles a day. Patient interested in appetite stimulant. Will Rx: Megace 224m/5mL daily.  4.  Discuss nausea. She is doing well with the prescribed Ativan, however she is somnolent. Will try a reduced dose of Phenergan to control nausea. Rx sent in for Phenergan [id number] PRN (Disp #30) 5.  Discuss pain. Pain is well controlled on currently prescribed interventions. Continue Tramadol PRN 6.  Discuss TACHYcardia. Heart rate in the 110s. Patient is vertiginous with position changes. (+) orthostasis noted.  Will add 1L [place] with 2020mof Potassium today. 7.  Day 1 of cycle # 2 eribulin today.  8.  RTC on 01/23/2017 for MD assessment, labs (CBC with diff, CMP, Mg, PT/INR), and day 8 of cycle # 2 Eribulin.   BryHonor LohP 01/16/2017, 12:04 PM   I saw and evaluated the patient, participating in the key portions of the service and reviewing pertinent diagnostic studies and records.  I reviewed the nurse practitioner's note and agree with the findings and the plan.  The assessment and plan were discussed with the patient.  Multiple questions were asked by the patient and answered.  Patient's Child Pugh score is 7  (encephalopathy- 2; ascites- 1; bilirubin- 1; albumen- 2; PT- 1).  Child class is B (7-9).  Eribulin dose remains at 0.7 mg/m2.   MelNolon StallsD 01/16/2017,12:04 PM

## 2017-01-16 NOTE — Progress Notes (Signed)
Here for follow up. Per husband  -pt confirmed- feeling weak tired and intermittently dizzy. Much fatigue per pt and husband w ambulating.

## 2017-01-17 ENCOUNTER — Encounter: Payer: Self-pay | Admitting: Hematology and Oncology

## 2017-01-17 LAB — CA 27.29 (SERIAL MONITOR): CA 27.29: 1851.1 U/mL — ABNORMAL HIGH (ref 0.0–38.6)

## 2017-01-23 ENCOUNTER — Other Ambulatory Visit: Payer: Self-pay | Admitting: *Deleted

## 2017-01-23 ENCOUNTER — Inpatient Hospital Stay: Payer: Medicare HMO

## 2017-01-23 ENCOUNTER — Inpatient Hospital Stay: Payer: Medicare HMO | Attending: Hematology and Oncology

## 2017-01-23 ENCOUNTER — Inpatient Hospital Stay (HOSPITAL_BASED_OUTPATIENT_CLINIC_OR_DEPARTMENT_OTHER): Payer: Medicare HMO | Admitting: Hematology and Oncology

## 2017-01-23 ENCOUNTER — Other Ambulatory Visit: Payer: Self-pay | Admitting: Hematology and Oncology

## 2017-01-23 VITALS — BP 111/77 | HR 125 | Temp 99.4°F | Resp 20 | Wt 137.0 lb

## 2017-01-23 DIAGNOSIS — C787 Secondary malignant neoplasm of liver and intrahepatic bile duct: Secondary | ICD-10-CM | POA: Insufficient documentation

## 2017-01-23 DIAGNOSIS — C7951 Secondary malignant neoplasm of bone: Secondary | ICD-10-CM | POA: Diagnosis not present

## 2017-01-23 DIAGNOSIS — R42 Dizziness and giddiness: Secondary | ICD-10-CM

## 2017-01-23 DIAGNOSIS — K869 Disease of pancreas, unspecified: Secondary | ICD-10-CM

## 2017-01-23 DIAGNOSIS — C7931 Secondary malignant neoplasm of brain: Secondary | ICD-10-CM | POA: Diagnosis not present

## 2017-01-23 DIAGNOSIS — Z5111 Encounter for antineoplastic chemotherapy: Secondary | ICD-10-CM | POA: Diagnosis not present

## 2017-01-23 DIAGNOSIS — R531 Weakness: Secondary | ICD-10-CM

## 2017-01-23 DIAGNOSIS — R5383 Other fatigue: Secondary | ICD-10-CM | POA: Insufficient documentation

## 2017-01-23 DIAGNOSIS — Z923 Personal history of irradiation: Secondary | ICD-10-CM | POA: Diagnosis not present

## 2017-01-23 DIAGNOSIS — R0609 Other forms of dyspnea: Secondary | ICD-10-CM | POA: Diagnosis not present

## 2017-01-23 DIAGNOSIS — C7801 Secondary malignant neoplasm of right lung: Secondary | ICD-10-CM

## 2017-01-23 DIAGNOSIS — C7970 Secondary malignant neoplasm of unspecified adrenal gland: Secondary | ICD-10-CM

## 2017-01-23 DIAGNOSIS — Z853 Personal history of malignant neoplasm of breast: Secondary | ICD-10-CM | POA: Diagnosis not present

## 2017-01-23 DIAGNOSIS — Z170 Estrogen receptor positive status [ER+]: Secondary | ICD-10-CM

## 2017-01-23 DIAGNOSIS — D0512 Intraductal carcinoma in situ of left breast: Secondary | ICD-10-CM

## 2017-01-23 DIAGNOSIS — Z9223 Personal history of estrogen therapy: Secondary | ICD-10-CM | POA: Diagnosis not present

## 2017-01-23 DIAGNOSIS — R110 Nausea: Secondary | ICD-10-CM

## 2017-01-23 DIAGNOSIS — C770 Secondary and unspecified malignant neoplasm of lymph nodes of head, face and neck: Secondary | ICD-10-CM | POA: Diagnosis not present

## 2017-01-23 DIAGNOSIS — R000 Tachycardia, unspecified: Secondary | ICD-10-CM | POA: Insufficient documentation

## 2017-01-23 DIAGNOSIS — E538 Deficiency of other specified B group vitamins: Secondary | ICD-10-CM

## 2017-01-23 DIAGNOSIS — Z9013 Acquired absence of bilateral breasts and nipples: Secondary | ICD-10-CM

## 2017-01-23 DIAGNOSIS — Z880 Allergy status to penicillin: Secondary | ICD-10-CM | POA: Insufficient documentation

## 2017-01-23 DIAGNOSIS — Z79899 Other long term (current) drug therapy: Secondary | ICD-10-CM | POA: Insufficient documentation

## 2017-01-23 DIAGNOSIS — E876 Hypokalemia: Secondary | ICD-10-CM

## 2017-01-23 DIAGNOSIS — C7889 Secondary malignant neoplasm of other digestive organs: Secondary | ICD-10-CM

## 2017-01-23 DIAGNOSIS — Z780 Asymptomatic menopausal state: Secondary | ICD-10-CM | POA: Diagnosis not present

## 2017-01-23 DIAGNOSIS — Z8042 Family history of malignant neoplasm of prostate: Secondary | ICD-10-CM | POA: Insufficient documentation

## 2017-01-23 DIAGNOSIS — M545 Low back pain: Secondary | ICD-10-CM | POA: Insufficient documentation

## 2017-01-23 DIAGNOSIS — Z803 Family history of malignant neoplasm of breast: Secondary | ICD-10-CM | POA: Insufficient documentation

## 2017-01-23 DIAGNOSIS — D649 Anemia, unspecified: Secondary | ICD-10-CM | POA: Insufficient documentation

## 2017-01-23 DIAGNOSIS — Z7189 Other specified counseling: Secondary | ICD-10-CM

## 2017-01-23 DIAGNOSIS — C50919 Malignant neoplasm of unspecified site of unspecified female breast: Secondary | ICD-10-CM

## 2017-01-23 DIAGNOSIS — G893 Neoplasm related pain (acute) (chronic): Secondary | ICD-10-CM

## 2017-01-23 DIAGNOSIS — Z806 Family history of leukemia: Secondary | ICD-10-CM | POA: Insufficient documentation

## 2017-01-23 DIAGNOSIS — C50912 Malignant neoplasm of unspecified site of left female breast: Secondary | ICD-10-CM

## 2017-01-23 DIAGNOSIS — C50911 Malignant neoplasm of unspecified site of right female breast: Secondary | ICD-10-CM

## 2017-01-23 LAB — COMPREHENSIVE METABOLIC PANEL
ALT: 31 U/L (ref 14–54)
AST: 51 U/L — ABNORMAL HIGH (ref 15–41)
Albumin: 3.3 g/dL — ABNORMAL LOW (ref 3.5–5.0)
Alkaline Phosphatase: 149 U/L — ABNORMAL HIGH (ref 38–126)
Anion gap: 9 (ref 5–15)
BUN: 6 mg/dL (ref 6–20)
CO2: 22 mmol/L (ref 22–32)
Calcium: 9 mg/dL (ref 8.9–10.3)
Chloride: 104 mmol/L (ref 101–111)
Creatinine, Ser: 0.53 mg/dL (ref 0.44–1.00)
GFR calc Af Amer: >60 mL/min (ref >60)
GFR calc non Af Amer: >60 mL/min (ref >60)
Glucose, Bld: 158 mg/dL — ABNORMAL HIGH (ref 65–99)
Potassium: 3.2 mmol/L — ABNORMAL LOW (ref 3.5–5.1)
Sodium: 135 mmol/L (ref 135–145)
Total Bilirubin: 1.1 mg/dL (ref 0.3–1.2)
Total Protein: 6.8 g/dL (ref 6.5–8.1)

## 2017-01-23 LAB — CBC WITH DIFFERENTIAL/PLATELET
Basophils Absolute: 0 10*3/uL (ref 0–0.1)
Basophils Relative: 1 %
Eosinophils Absolute: 0 10*3/uL (ref 0–0.7)
Eosinophils Relative: 0 %
HCT: 31.9 % — ABNORMAL LOW (ref 35.0–47.0)
Hemoglobin: 10.7 g/dL — ABNORMAL LOW (ref 12.0–16.0)
Lymphocytes Relative: 12 %
Lymphs Abs: 0.5 10*3/uL — ABNORMAL LOW (ref 1.0–3.6)
MCH: 30.5 pg (ref 26.0–34.0)
MCHC: 33.7 g/dL (ref 32.0–36.0)
MCV: 90.6 fL (ref 80.0–100.0)
Monocytes Absolute: 0.3 10*3/uL (ref 0.2–0.9)
Monocytes Relative: 7 %
Neutro Abs: 3.3 10*3/uL (ref 1.4–6.5)
Neutrophils Relative %: 80 %
Platelets: 217 10*3/uL (ref 150–440)
RBC: 3.52 MIL/uL — ABNORMAL LOW (ref 3.80–5.20)
RDW: 20.3 % — ABNORMAL HIGH (ref 11.5–14.5)
WBC: 4.1 10*3/uL (ref 3.6–11.0)

## 2017-01-23 LAB — PROTIME-INR
INR: 1.18
Prothrombin Time: 14.9 seconds (ref 11.4–15.2)

## 2017-01-23 LAB — MAGNESIUM: Magnesium: 1.9 mg/dL (ref 1.7–2.4)

## 2017-01-23 MED ORDER — SODIUM CHLORIDE 0.9 % IV SOLN
Freq: Once | INTRAVENOUS | Status: AC
  Administered 2017-01-23: 11:00:00 via INTRAVENOUS
  Filled 2017-01-23: qty 1000

## 2017-01-23 MED ORDER — POTASSIUM CHLORIDE 20 MEQ/100ML IV SOLN
20.0000 meq | Freq: Once | INTRAVENOUS | Status: AC
  Administered 2017-01-23: 20 meq via INTRAVENOUS
  Filled 2017-01-23: qty 100

## 2017-01-23 MED ORDER — SODIUM CHLORIDE 0.9 % IV SOLN: 10.0000 mg | Freq: Once | INTRAVENOUS | Status: DC

## 2017-01-23 MED ORDER — HEPARIN SOD (PORK) LOCK FLUSH 100 UNIT/ML IV SOLN
500.0000 [IU] | Freq: Once | INTRAVENOUS | Status: AC
  Administered 2017-01-23: 500 [IU] via INTRAVENOUS
  Filled 2017-01-23: qty 5

## 2017-01-23 MED ORDER — PROMETHAZINE HCL 25 MG/ML IJ SOLN
12.5000 mg | Freq: Once | INTRAMUSCULAR | Status: AC | PRN
  Administered 2017-01-23: 12.5 mg via INTRAVENOUS
  Filled 2017-01-23: qty 1

## 2017-01-23 MED ORDER — DEXAMETHASONE SODIUM PHOSPHATE 10 MG/ML IJ SOLN
10.0000 mg | Freq: Once | INTRAMUSCULAR | Status: AC
  Administered 2017-01-23: 10 mg via INTRAVENOUS
  Filled 2017-01-23: qty 3
  Filled 2017-01-23: qty 1

## 2017-01-23 MED ORDER — SODIUM CHLORIDE 0.9 % IV SOLN
0.7000 mg/m2 | Freq: Once | INTRAVENOUS | Status: AC
  Administered 2017-01-23: 1.2 mg via INTRAVENOUS
  Filled 2017-01-23: qty 2.4

## 2017-01-23 MED ORDER — PEGFILGRASTIM 6 MG/0.6ML ~~LOC~~ PSKT
6.0000 mg | PREFILLED_SYRINGE | Freq: Once | SUBCUTANEOUS | Status: AC
  Administered 2017-01-23: 6 mg via SUBCUTANEOUS
  Filled 2017-01-23: qty 0.6

## 2017-01-23 MED ORDER — SODIUM CHLORIDE 0.9% FLUSH
10.0000 mL | INTRAVENOUS | Status: DC | PRN
  Administered 2017-01-23: 10 mL via INTRAVENOUS
  Filled 2017-01-23: qty 10

## 2017-01-23 NOTE — Progress Notes (Signed)
[facility] day:  01/23/2017    Chief Complaint: Melinda Tanner is a 46 y.o. female with metastatic breast cancer with brain metastasis who is seen for assessment prior to day 8 of cycle #2 eribulin.   HPI:  The patient was last seen in the medical oncology clinic on 01/16/2017.  At that time, she was weak and fatigued.  She spent most of the day in bed. Her nausea was well controlled with Ativan.  She had intermittent pain in her abdomen. Her appetite was decreased and she had lost 5 pounds. LFTs had improved.  CA27.29 had decreased from 3568.2 to 1851.1.  Pugh class was 7.  She received eribulin.  During the interim, patient has been doing "a little bit better". Patient continues to be fatigued. She notes that there fatigue markedly increases with short distance ambulation. Patient is spending most of her day napping. Patient is lightheaded at times and experiencing some slight pain over her eyes. She has "very slight" lower back pain when she sits for a long time. Patient does not use any pharmacological interventions for this. She uses a heating pad PRN. Patient has Tramadol, however she states, "I do not like taking medications". Patient notes that her appetite has improved on the prescribed Megace. Her weight remains stable.   Patient remains TACHYcardic to 125. She has a low grade temperature; 99.4 today in the clinic. Patient denies signs or symptoms consistent with acute infection. She denies respiratory and urinary symptoms. Patient denies pain in the clinic today. Patient makes note of the fact that her daughter had "the flu". Child did not have a fever. She was treated x 4 days with over the counter Theraflu. Patient has not received her annual influenza vaccination.    Past Medical History:  Diagnosis Date  . Brain cancer ([place]) 2012   Met. from Breast  . Breast cancer ([place]) 2009  . Complication of anesthesia    nausea, "drops in  potassium and magnesium"  . Seizures ([place])     Past Surgical History:  Procedure Laterality Date  . BRAIN SURGERY  2012, 2106  . CESAREAN SECTION    . MASTECTOMY Bilateral 2010    Family History  Problem Relation Age of Onset  . Cancer Paternal Aunt   . Cancer Paternal Uncle   . Cancer Paternal Grandfather   . Hypertension Brother   . Diabetes Paternal Grandmother   A paternal aunt had breast cancer age 56, a paternal uncle had prostate cancer, and a paternal grandfather had leukemia.  Social History:  reports that  has never smoked. she has never used smokeless tobacco. She reports that she does not drink alcohol or use drugs.  She is from [place].  She moved to [place] in 2015.  She moved to [place] in 04/2014.  She recently moved into the [place] area.  She has 2 children (boy and girl).  Her family will likely be moving to [facility] in December.  They are looking for a home.  She went on a trip to [place] 10/18/2016 - 10/24/2016.  Her husband is in training today.  The patient is accompanied by Ronnald [facility] interpreter).  Allergies:  Allergies  Allergen Reactions  . Taxol [Paclitaxel] Anaphylaxis  . Aspirin Swelling  . Penicillins Swelling  . Zofran [Ondansetron Hcl] Nausea And Vomiting    Current Medications: Current Outpatient Medications  Medication Sig Dispense Refill  . CALCIUM-VITAMIN D PO Take 1 tablet  by mouth daily.    . capecitabine (XELODA) 150 MG tablet     . Cyanocobalamin (VITAMIN B-12 PO) Take by mouth.    . dexamethasone (DECADRON) 4 MG tablet Take 1 tablet (4 mg total) by mouth daily. 25 tablet 0  . ibuprofen (ADVIL,MOTRIN) 800 MG tablet Take 800 mg by mouth every 8 (eight) hours as needed (Takes 1/2 tablet prn).    . LORazepam (ATIVAN) 0.5 MG tablet 1/2-1 tab (0.33m to 0.523m every 6 hours PRN nausea 30 tablet 0  . megestrol (MEGACE) 400 MG/10ML suspension Take 5 mLs (200 mg total) by mouth daily. 240 mL 0  . Multiple Vitamins-Minerals  (CENTRUM ADULTS PO) Take 1 tablet by mouth daily.    . Marland Kitchenmeprazole (PRILOSEC) 20 MG capsule Take 20 mg by mouth daily.  2  . pantoprazole (PROTONIX) 20 MG tablet Take 1 tablet by mouth daily 30 tablet 3  . promethazine (PHENERGAN) 12.5 MG tablet Take 1 tablet (12.5 mg total) by mouth every 6 (six) hours as needed for nausea or vomiting. 30 tablet 0  . docusate sodium (COLACE) 100 MG capsule Take 1 tablet once or twice daily as needed for constipation while taking narcotic pain medicine (Patient not taking: Reported on 12/25/2016) 30 capsule 0  . potassium chloride SA (K-DUR,KLOR-CON) 20 MEQ tablet Take 1 tablet (20 mEq total) by mouth daily. (Patient not taking: Reported on 01/16/2017) 3 tablet 1  . traMADol (ULTRAM) 50 MG tablet Take 1 tablet (50 mg total) by mouth every 12 (twelve) hours as needed. (Patient not taking: Reported on 01/16/2017) 30 tablet 0   No current facility-administered medications for this visit.    Facility-Administered Medications Ordered in Other Visits  Medication Dose Route Frequency Provider Last Rate Last Dose  . heparin lock flush 100 unit/mL  500 Units Intravenous Once Corcoran, Melissa C, MD      . sodium chloride flush (NS) 0.9 % injection 10 mL  10 mL Intravenous PRN CoLequita AsalMD   10 mL at 01/23/17 0840    Review of Systems:  GENERAL:  Fatigued, but doing "a little better".  No fevers or sweats.  Weight stable. PERFORMANCE STATUS (ECOG):  1 HEENT:  No visual changes, runny nose, sore throat, mouth sores or tenderness.  Excess saliva. Lungs: Shortness of breath with exertion.  No cough.  No hemoptysis. Cardiac:  No chest pain, palpitations, orthopnea, or PND. GI:  Intermittent abdominal pain.  Nausea and vomiting, improved.  Appetite better.  Bowel movement every other day.  No diarrhea, constipation, melena or hematochezia.  GU:  No urgency, frequency, dysuria or hematuria. Musculoskeletal: Low back pain, slight.  No muscle tenderness. Extremities:   No pain or swelling. Skin:  No rashes or irritation. Neuro:  No headache, numbness or weakness, balance or coordination issues.  Endocrine:  No diabetes, thyroid issues, or night sweats.  Hot flashes. Psych:  No mood changes, depression or anxiety. Pain: No focal pain.  Review of systems:  All other systems reviewed and found to be negative.  Physical Exam: Blood pressure 111/77, pulse (!) 125, temperature 99.4 F (37.4 C), temperature source Tympanic, resp. rate 20, weight 137 lb (62.1 kg), last menstrual period 03/19/2015. GENERAL:  Fatigued appearing woman sitting comfortably in the exam room in no acute distress.  She has slightly more energy today. MENTAL STATUS:  Alert and oriented to person, place and time. HEAD: Wearing a brown crochet cap.  Normocephalic, atraumatic, face symmetric, no Cushingoid features. EYES:  Brown eyes.  Sclera not icteric.  Pupils equal round and reactive to light and accomodation.  No conjunctivitis. ENT:  Oropharynx clear without lesion.  Tongue normal. Mucous membranes moist.  RESPIRATORY:  Decreased breath sounds right base (chronic).  Clear to auscultation without rales, wheezes or rhonchi. CARDIOVASCULAR:  Regular rate and rhythm without murmur, rub or gallop. ABDOMEN:  Soft, slightly tender mid upper abdomen without guarding or rebound tenderness.  Active bowel sounds.  Liver edge palpable.  No splenomegaly.  No masses. SKIN: No rashes, bruises or ulcers. EXTREMITIES:  No edema, no skin discoloration or tenderness.  No palpable cords. LYMPH NODES:  No palpable cervical, supraclavicular, axillary or inguinal adenopathy  NEUROLOGICAL:  Appropriate. PSYCH:  Appropriate.     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 No lymph invasion. Tumor cellularity 20%. [id number]. 07/13/2008: Left mastectomy: Residual ductal carcinoma in situ, high nuclear grade, deep margin negative ypTisNO(sn)MX. 10/25/2010: Abdominal and pelvic ultrasound: questionable lesion near gallbladder fossa recommend MRI follow up. 03/02/2011: Left and right frontal excisional brain biopsy: Adenocarcinoma, metastatic, NOS: ER +, PR 5% +, Her 2 NEG. 03/04/2011: [facility] testing of IHC4 residual recurrence risk score of 114 showing an 8 year recurrence reate of 57%. ER POSTIVE, PR POSITIVE, HER 2 POSTIVE (previously documented negative in 2009) cutoff of 361 patient scores 426. ki67 71%.  08/05/2013: Right breast FNA supraclavicular region: positive for metastatic carcinoma. ER/PR/HER2 not reported. 08/19/2014: Right cervical lymph node. Adenocarcinoma with features consistent with metastatic breast carcinoma. ER/PR/HER2 insufficient tissue. 09/04/2014: Repeat craniotomy, resection of right frontal tumor (metastatic breast cancer). ER 90-100% PR 51-60% HER2 - 09/29/2014: Right cervical lymph node, metastatic carcinoma c/w breast primary, ER 100% PR 20% HER2-  Imaging: 08/15/2007: Bilateral mammogram: Dense breasts with solid lesion at 6:00, 7:00, and 9:00 of right breast, solid lesion at 2:00 position left breast BIRADS 4B. 09/10/2007: Breast MRI: Three solid irregular enhancing lesions of the right breast 2.1 x 3.2, 2.8 x 2.7, and 1.8 x 1.6 cm. Mildly enlarged enhancing nodule right axilla. Left breast clumped enhancement midportion suspicious for malignancy. 03/01/2011: Brain MRI: At least two juxtacortical, intra-axial masses with extensive surrounding vasogenic edema most consistent with intracranial metastasis. 07/22/2013: Brain MRI +/- contrast: interval increase in enhancing component on T2 FLAIR now measuring  1.0cm x 1.1 cm worrisome for progression of disease. 01/21/2014:  PET scan: Hypermetabolic small right supraclavicular and right upper paratracheal lymph nodes suspicious for mets. Hypermetabolic lymph node in the right paratracheal upper mediastinum that measure approximately 0.9 x 0.5cm. (of note no impressions made of areas noted on brain MRI 07/22/13).  01/21/2014: Head MRI: Right frontal enhancing lesion has shown interval increase in size with surrounding edema and local mass effect (measured 1.8x2.1x1.6, previously 1.2x1.1x1.0) 07/16/2014: Head MRI:  Right anterior frontal enhancing mass 2.5 x 2.0 cm compatible with a metastatic lesion has increased and changed in configuration from previous 2.0 x 1.8 cm, formerly multilobular. 08/06/2014: PET scan:  Multiple bilateral FDG avid low cervical, supraclavicular, upper mediastinal, and right internal mammary lymphadenopathy, likely representing metastatic disease. A cervical node in the right lower neck should be amenable to percutaneous sampling.  12/2014: Head MRI:  Evolution of right frontal postoperative changes status post tumor resection at  the vertex. Expected postoperative changes. Minimal marginal enhancement resection bed with some adjacent posterior intrinsic T1 signal again seen. Decreasing T2/FLAIR adjacent parenchymal changes.  Stable resection cavity is left posterior frontal and right parieto-occipital regions. 04/20/2015 : PET scan: Cervical and thoracic nodal metastasis.  There was multifocal osseous metastasis (left transverse T4 process, left humeral head, right iliac wing, and left side of the sacrum).  Incidental findings included right nephrolithiasis.   04/22/2015: Bone scan: Corresponded with PET-CT with metastatic lesions evident in the left humeral head, left posterior aspect of T4, left aspect of S1, and the right iliac crest.  There was no other definite foci of metastatic disease to bone.  Uptake in the skull was most suggestive  of the previous right frontal craniotomy. 05/24/2015: Bone density:  T-score of -0.8 in the right femoral neck (normal). 07/07/2015:  Head MRI:  Metastatic breast cancer with 3 resection cavities. The anterior right frontal cavity was positive for nodular marginal enhancement, new from brain MRI report 01/07/2015, and consistent with recurrent disease.  08/12/2015:  Lumbar Spine MRI: Osseous metastatic disease at L3, L4, L5, S1, and in the left iliac bone posteriorly. There was no definite epidural metastatic disease. 08/20/2015:  PET scan:  Response to therapy in the lower cervical and thoracic adenopathy.  There was a mixed response to multifocal osseous metastases (some new lesions).  12/06/2015:  Head MRI:  Decreased nodular contrast enhancement at the right frontal lobe treatment site.  There was unchanged appearance of treatment sites within the bilateral parietal lobe size without residual or recurrent disease.  There were no new metastatic lesions. 02/18/2016: PET scan: Mixed response to therapy. There hadbeen improvement in right cervical lymphadenopathy and in several osseous lesions. There wereseveral other osseous lesions in the pelvis with increasing hypermetabolism compared to the prior study. As the majority of the osseous lesions wereincreasingly sclerotic, some of this hypermetabolism could simply reflect bony healing. There was no new extraskeletal metastatic disease is noted in the neck, chest, abdomen or pelvis. 03/06/2016: Head MRI: Unchanged small focus of nodular enhancement at the right frontal resection site. There was unchanged appearance of the 2 other resection sites without abnormal enhancement. There was no evidence of new intracranial metastases. 05/23/2016:  Bone scan:  Stable focus of abnormal uptake seen in right frontal skull consistent with prior craniotomy.  There were areas of abnormal uptake identified in the left humeral head, T4 and S1 levels and right iliac  crest that were present but decreased in intensity compared to prior exam suggesting improvement.  There was a new foci of abnormal uptake are noted in a right lower rib,T12 and L1 levels of spine concerning for metastatic disease. 06/07/2016:  Thoracic and lumbar spine MRI:  multiple low T1/T2 weighted signal, non enhancing lesions within the thoracic spine, which corresponded in size and location to sclerotic lesions demonstrated on the PET CT of 02/18/2016.  There were no new thoracic lesions.  There was minimal residual contrast enhancement within the L5 vertebral body metastatic lesion, decreased compared to the MRI of 08/12/2015.  There was minimal contrast enhancement at the periphery of the lesion within the L1 vertebral body. This lesion was new compared to MRI of 08/12/2015, but unchanged in size and location compared to PET CT of 02/18/2016.  There was no epidural disease, spinal canal stenosis or neural foraminal encroachment. 06/13/2016:  Head MRI revealed no evidence of residual or recurrent disease. 08/11/2016:  Chest, abdomen, and pelvic CT revealed  new 8 mm  right supraclavicular, 12 mm right paratracheal and 10 mm subcarinal lymphadenopathy suspicious for metastatic nodal recurrence.  There was extensive patchy sclerotic osseous metastases throughout the axial and proximal appendicular skeleton appear stable in size and distribution although generally increased in sclerosis since 02/18/2016 PET-CT, which was a nonspecific change that could reflect treatment effect or progression.  There was no additional sites of metastatic disease in the chest, abdomen or pelvis. 08/11/2016:  Bone scan revealed scattered foci of abnormal osseous tracer accumulation consistent with osseous metastatic disease with new sites of abnormal uptake in the thoracic spine at T5 in the posterior LEFT sixth rib. 08/31/2016:  Chest CT angiogram revealed interval confluent opacity in the posterior aspect of the right upper  lobe suspicious for pneumonia. There was a small right-sided pleural effusion and adjacent right lower lobe atelectasis. 10/27/2016:  Head MRI revealed 9 new small brain metastases since 05/2016. The largest was a 10 mm metastasis in the posterior right cerebellum. The smallest lesions were punctate, and 1 of these along the posterior inferior left cerebellar hemisphere was suspicious for leptomeningeal based disease.  There was no associated edema or mass effect. 11/29/2016:  Chest, abdomen, and pelvic CT revealed multiple hepatic metastasis, a 10 mm adrenal metastasis, multiple abdominal nodes, 4.2 x 2.1 cm pancreatic head mass and a separate 1.1 cm lesion in the anterior pancreatic body, new 9 mm paraesophageal lymph node, trace pericardial thickening/fluid, 2 focal lung nodules (10 mm and 5 mm) in the RUL, and an enlarging blastic lesion of T5. 12/22/2016:  Abdomen and pelvic CT revealed slight progression of hepatic metastatic disease.  There was stable to slightly smaller periportal, celiac axis and retrocrural lymph nodes.  There was stable retroperitoneal lymph nodes.  There was stable ill-defined possible lesion in the pancreatic head.  There was a slightly larger lesion in the pancreatic body and a new lesion in the head body junction region.  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  There was a small right pleural effusion with overlying atelectasis.   Labs:  Infusion on 01/23/2017  Component Date Value Ref Range Status  . Prothrombin Time 01/23/2017 14.9  11.4 - 15.2 seconds Final  . INR 01/23/2017 1.18   Final  . Magnesium 01/23/2017 1.9  1.7 - 2.4 mg/dL Final  . Sodium 01/23/2017 135  135 - 145 mmol/L Final  . Potassium 01/23/2017 3.2* 3.5 - 5.1 mmol/L Final  . Chloride 01/23/2017 104  101 - 111 mmol/L Final  . CO2 01/23/2017 22  22 - 32 mmol/L Final  . Glucose, Bld 01/23/2017 158* 65 - 99 mg/dL Final  . BUN 01/23/2017 6  6 - 20 mg/dL Final  . Creatinine, Ser  01/23/2017 0.53  0.44 - 1.00 mg/dL Final  . Calcium 01/23/2017 9.0  8.9 - 10.3 mg/dL Final  . Total Protein 01/23/2017 6.8  6.5 - 8.1 g/dL Final  . Albumin 01/23/2017 3.3* 3.5 - 5.0 g/dL Final  . AST 01/23/2017 51* 15 - 41 U/L Final  . ALT 01/23/2017 31  14 - 54 U/L Final  . Alkaline Phosphatase 01/23/2017 149* 38 - 126 U/L Final  . Total Bilirubin 01/23/2017 1.1  0.3 - 1.2 mg/dL Final  . GFR calc non Af Amer 01/23/2017 >60  >60 mL/min Final  . GFR calc Af Amer 01/23/2017 >60  >60 mL/min Final   Comment: (NOTE) The eGFR has been calculated using the CKD EPI equation. This calculation has not been validated in all clinical situations. eGFR's persistently <60  mL/min signify possible Chronic Kidney Disease.   . Anion gap 01/23/2017 9  5 - 15 Final  . WBC 01/23/2017 4.1  3.6 - 11.0 K/uL Final  . RBC 01/23/2017 3.52* 3.80 - 5.20 MIL/uL Final  . Hemoglobin 01/23/2017 10.7* 12.0 - 16.0 g/dL Final  . HCT 01/23/2017 31.9* 35.0 - 47.0 % Final  . MCV 01/23/2017 90.6  80.0 - 100.0 fL Final  . MCH 01/23/2017 30.5  26.0 - 34.0 pg Final  . MCHC 01/23/2017 33.7  32.0 - 36.0 g/dL Final  . RDW 01/23/2017 20.3* 11.5 - 14.5 % Final  . Platelets 01/23/2017 217  150 - 440 K/uL Final  . Neutrophils Relative % 01/23/2017 80  % Final  . Neutro Abs 01/23/2017 3.3  1.4 - 6.5 K/uL Final  . Lymphocytes Relative 01/23/2017 12  % Final  . Lymphs Abs 01/23/2017 0.5* 1.0 - 3.6 K/uL Final  . Monocytes Relative 01/23/2017 7  % Final  . Monocytes Absolute 01/23/2017 0.3  0.2 - 0.9 K/uL Final  . Eosinophils Relative 01/23/2017 0  % Final  . Eosinophils Absolute 01/23/2017 0.0  0 - 0.7 K/uL Final  . Basophils Relative 01/23/2017 1  % Final  . Basophils Absolute 01/23/2017 0.0  0 - 0.1 K/uL Final    Assessment:  Jaleiyah Alas [name] is a 46 y.o. 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  She initially presented in 2009 while living in [place] with multi-focal right breast cancer with  positive lymph node(s) which was ER+, PR+(low), and HER2/neu+. She received neoadjuvant chemotherapy (AC x 4 every 3 weeks followed by Taxol/Abraxane + carboplatin weekly x 12) without anti-HER2 treatment. BRCA1/2 testing was negative on 08/15/2016.  On 07/14/2008, she underwent bilateral mastectomies followed by reconstruction.  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   Zero of 11 lymph nodes on the right were positive for malignancy. 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 Deep margin was negative. Zero of 2 lymph nodes were positive for metastatic disease.  She received adjuvant radiation to the right breast.  She received adjuvant tamoxifen and Zoladex.  She could not tolerate symptoms of joint pain and tamoxifen was discontinued after 1-2 months.  Zoladex was continued for approximately 1.5 years.  She was diagnosed with brain metastases in 02/2011.  Pathology revealed ER+, PR+(low), and HER2/neu-.  She underwent resection followed by [facility].  On 03/09/2011, original breast tissue (09/03/2007) sent to [facility] testing.  Testing revealed ER+, PR+(borderline), and HER2/neu positive (different than initial testing).  She received Herceptin for 1 year beginning in 2013.  She then began Tykerb and Xeloda after completion of Herceptin (2014).  She was on extremely low, and probably sub-therapeutic, dosing of lapatinib + capecitabine.   She developed right supraclavicular adenopathy in 2015. She was noted to have slow disease progression in the right frontal/parietal lesion with minimal presence of systemic disease on PET scans. Capecitabine + lapatinib were discontinued in 04/2014.   She underwent repeat craniotomy with resection of brain metastasis on 09/04/2014.  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 Tumor was ER positive (> 90%) PR negative  and HER-2/neu negative (1+ IHC).  She started tamoxifen in 09/2014, but discontinued it secondary to pain in hip, back, and shoulder.  She restarted tamoxifen on 04/23/2015.  Tamoxifen was discontinued on 08/23/2015 secondary to progressive disease.  She received Zoladex on 04/30/2015.  She  underwent laparoscopic bilateral oophorectomy on 05/18/2015.  She receives monthly Xgeva (began 04/30/2015; last 11/13/2016).  She is post-menopausal.  She received Faslodex from 08/23/2015 - 04/04/2016.  She received 7 cycles of Ibrance (09/20/2015 - 06/18/2016).  Patient received palliative radiation 30 Gy to the left humerus and lumbar spine from 08/25/2015 - 09/09/2015.  She was not eligible for a clinical trial.  She completed palliative radiation to T12-L1 of 3000 cGy (06/21/2016 - 07/04/2016).  She received palliative radiation to the left hip from 08/17/2016 - 08/30/2016.    Xeloda was cost prohibitive.  She does not wish to pursue navelbine or gemcitabine.  She received 3 cycles of Abraxane (10/09/2016 - 12/01/2016).  CA27.29 has been followed: 177.8 on 04/16/2015, 276.4 on 05/28/2015, 287.5 on 07/13/2015, 333.8 on 07/26/2015, 412.9 on 08/23/2015, 315.6 on 09/20/2015, 188.8 on 10/18/2015, 151.5 on 11/15/2015, 122.7 on 12/10/2015, 94.6 on 01/10/2016, 80.9 on 02/07/2016, 79 on 03/06/2016, 84.8 on 04/04/2016, 85.2 on 05/02/2016, 91.1 on 05/30/2016, 121 on 06/30/2016, 182.2 on 07/31/2016, 420.5 on 10/09/2016, 564.6 on 10/30/2016, 1322.6 on 11/27/2016, 3170.6 on 12/25/2016, 3568.2 on 12/27/2016, and 1851.1 on 01/16/2017.  Head MRI on 06/13/2016 revealed no evidence of residual or recurrent disease.  Head MRI on 10/27/2016 revealed 9 new small brain metastases since 05/2016. The largest was a 10 mm metastasis in the posterior right cerebellum. The smallest lesions were punctate, and 1 of these along the posterior inferior left cerebellar hemisphere was suspicious for leptomeningeal based disease.  There was no  associated edema or mass effect.  She completed whole brain radiation (11/08/2016 - 11/24/2016).  She is on a steroid taper.  Bone scan on 08/11/2016 revealed scattered foci of abnormal osseous tracer accumulation consistent with osseous metastatic disease with new sites of abnormal uptake in the thoracic spine at T5 in the posterior LEFT sixth rib.  Chest, abdomen, and pelvic CT on 11/29/2016 revealed multiple hepatic metastasis, a 10 mm adrenal metastasis, multiple abdominal nodes, 4.2 x 2.1 cm pancreatic head mass and a separate 1.1 cm lesion in the anterior pancreatic body, new 9 mm paraesophageal lymph node, trace pericardial thickening/fluid, 2 focal lung nodules (10 mm and 5 mm) in the RUL, and an enlarging blastic lesion of T5.  Abdomen and pelvic CT on 12/22/2016 revealed slight progression of hepatic metastatic disease.  There was stable to slightly smaller periportal, celiac axis and retrocrural lymph nodes.  There was stable retroperitoneal lymph nodes.  There was stable ill-defined possible lesion in the pancreatic head.  There was a slightly larger lesion in the pancreatic body and a new lesion in the head body junction region.  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  There was a small right pleural effusion with overlying atelectasis.  She is day 8 of cycle #2 eribulin (12/27/2016 - 01/16/2017).  Child class was B (7-9).  She received eribulin 0.7 mg/m2.  Bone density study on 05/24/2015 revealed a T-score of -0.8 in the right femoral neck (normal).  She is on a Fentanyl 25 mcg/hr patch.  Pain is well controlled.  Code status is FULL CODE.  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  Work-up on 10/25/2015 revealed a B12 of 141 (low).  B12 was 205 on 12/13/2015 with an MMA of 171 (normal) on 01/10/2016.  Ferritin was 43. Iron studies included a saturation of 13% and a TIBC of 354. TSH and folate were normal.  Reticulocyte  count was 2.2%.  She declined B12 injections.  She  started oral B12 1000 mcg on 11/05/2015.  She takes her B12 sporadically.  B12 level was 205 (low normal) on 12/13/2015.  Diet is good.  She denies any melena or hematochezia.  Symptomatically, she remains fatigued. Her nausea is well controlled using Ativan and low dose Phenergan.  Patient has intermittent pain in her lower back that improves with position change and heat therapy. Patient's appetite has improved on Megace. Her weight remains stable. She continues to spend most of the day in bed. LFTs have improved; AST 51, ALT 31, total bilirubin 1.1. WBC is 4,100 with an Brainards of 3300. Hemoglobin is 10.7, hematocrit 31.9, and platelets 217,000.  Potassium is 3.2.  Plan: 1.  Labs today:  CBC with diff, CMP, PT/INR. 2.  Day 8 of cycle #2 eribulin (maintain current dose) with OnPro Neulasta.  3.  Potassium low at 3.2. Discussed oral replacement, however patient would like to receive intravenous replacement. Will replace with 39mq of IV potassium today with her chemotherapy treatment.  4.  Discuss Xgeva. Calcium 9.0. Will hold Xgeva and place on administration at next visit based on patient preference.  5.  Discuss influenza vaccination. Patient is getting treatment today, which includes steroids. Patient notes that she has not been vaccinated in the last 3 years citing that she has not felt like she needed it. This year, she has not been vaccinated because she "has not felt well". Education provided on prevention strategies. Patient does not wish to receive vaccination at this time. Will revisit at next visit.  6.  Discuss nutritional intake and weight. She was encouraged to continue supplement shakes. She will continue Megace 202m5mL daily. 7.  Discuss pain. Pain is well controlled on currently prescribed interventions. Continue Tramadol PRN 8.  Discuss TACHYcardia. Heart rate in the 120s. Denies vertiginous symptoms with position changes.  Blood  pressure stable.  Encouraged to increase fluid intake.  9.  RTC on 02/06/2017 for MD assessment, labs (CBC with diff, CMP, Mg, PT/INR), and day 1 of cycle # 3 eribulin.  Anticipate full dose at next visit.   BrHonor LohNP 01/23/2017, 9:54 AM   I saw and evaluated the patient, participating in the key portions of the service and reviewing pertinent diagnostic studies and records.  I reviewed the nurse practitioner's note and agree with the findings and the plan.  The assessment and plan were discussed with the patient.  Multiple questions were asked by the patient and answered.   MeNolon StallsMD 01/23/2017,9:54 AM

## 2017-01-23 NOTE — Progress Notes (Signed)
Patient is here alone today.  Interpreter requested.  Patient speaks enough English for assessment.  States her appetite is still not good but better with the Megace.  She states she is very tired.  When she has to walk very far she gets more tired.  She is having bowel movements every other day.  She is drinking Ensure.  HR 125 Temp 99.4, BP 111/77. Patient denies any pain, however, is grunting a lot during assessment.

## 2017-01-28 ENCOUNTER — Encounter: Payer: Self-pay | Admitting: Hematology and Oncology

## 2017-01-28 DIAGNOSIS — E876 Hypokalemia: Secondary | ICD-10-CM | POA: Insufficient documentation

## 2017-02-03 ENCOUNTER — Other Ambulatory Visit: Payer: Self-pay

## 2017-02-03 ENCOUNTER — Encounter: Payer: Self-pay | Admitting: Emergency Medicine

## 2017-02-03 ENCOUNTER — Emergency Department: Payer: Medicare HMO

## 2017-02-03 ENCOUNTER — Emergency Department
Admission: EM | Admit: 2017-02-03 | Discharge: 2017-02-04 | Disposition: A | Discharge status: Other Acute Inpatient Hospital | Payer: Medicare HMO | Attending: Emergency Medicine | Admitting: Emergency Medicine

## 2017-02-03 DIAGNOSIS — K8043 Calculus of bile duct with acute cholecystitis with obstruction: Secondary | ICD-10-CM | POA: Insufficient documentation

## 2017-02-03 DIAGNOSIS — Z9013 Acquired absence of bilateral breasts and nipples: Secondary | ICD-10-CM | POA: Insufficient documentation

## 2017-02-03 DIAGNOSIS — C787 Secondary malignant neoplasm of liver and intrahepatic bile duct: Secondary | ICD-10-CM | POA: Diagnosis not present

## 2017-02-03 DIAGNOSIS — K8020 Calculus of gallbladder without cholecystitis without obstruction: Secondary | ICD-10-CM

## 2017-02-03 DIAGNOSIS — R112 Nausea with vomiting, unspecified: Secondary | ICD-10-CM | POA: Diagnosis present

## 2017-02-03 DIAGNOSIS — R7989 Other specified abnormal findings of blood chemistry: Secondary | ICD-10-CM

## 2017-02-03 DIAGNOSIS — C7931 Secondary malignant neoplasm of brain: Secondary | ICD-10-CM | POA: Diagnosis not present

## 2017-02-03 DIAGNOSIS — R932 Abnormal findings on diagnostic imaging of liver and biliary tract: Secondary | ICD-10-CM | POA: Diagnosis not present

## 2017-02-03 DIAGNOSIS — C7801 Secondary malignant neoplasm of right lung: Secondary | ICD-10-CM | POA: Diagnosis not present

## 2017-02-03 DIAGNOSIS — Z79899 Other long term (current) drug therapy: Secondary | ICD-10-CM | POA: Diagnosis not present

## 2017-02-03 DIAGNOSIS — R945 Abnormal results of liver function studies: Secondary | ICD-10-CM | POA: Insufficient documentation

## 2017-02-03 DIAGNOSIS — Z880 Allergy status to penicillin: Secondary | ICD-10-CM | POA: Diagnosis not present

## 2017-02-03 DIAGNOSIS — Z9221 Personal history of antineoplastic chemotherapy: Secondary | ICD-10-CM | POA: Diagnosis not present

## 2017-02-03 DIAGNOSIS — K8010 Calculus of gallbladder with chronic cholecystitis without obstruction: Secondary | ICD-10-CM | POA: Diagnosis not present

## 2017-02-03 DIAGNOSIS — K819 Cholecystitis, unspecified: Secondary | ICD-10-CM

## 2017-02-03 DIAGNOSIS — C7951 Secondary malignant neoplasm of bone: Secondary | ICD-10-CM | POA: Insufficient documentation

## 2017-02-03 DIAGNOSIS — R1032 Left lower quadrant pain: Secondary | ICD-10-CM | POA: Insufficient documentation

## 2017-02-03 DIAGNOSIS — Z853 Personal history of malignant neoplasm of breast: Secondary | ICD-10-CM | POA: Diagnosis not present

## 2017-02-03 DIAGNOSIS — R748 Abnormal levels of other serum enzymes: Secondary | ICD-10-CM | POA: Diagnosis not present

## 2017-02-03 DIAGNOSIS — C7889 Secondary malignant neoplasm of other digestive organs: Secondary | ICD-10-CM | POA: Insufficient documentation

## 2017-02-03 LAB — CBC WITH DIFFERENTIAL/PLATELET
BAND NEUTROPHILS: 3 %
BASOS PCT: 0 %
Basophils Absolute: 0 10*3/uL (ref 0–0.1)
Blasts: 0 %
EOS ABS: 0 10*3/uL (ref 0–0.7)
EOS PCT: 0 %
HCT: 33.1 % — ABNORMAL LOW (ref 35.0–47.0)
Hemoglobin: 11 g/dL — ABNORMAL LOW (ref 12.0–16.0)
LYMPHS ABS: 1 10*3/uL (ref 1.0–3.6)
LYMPHS PCT: 4 %
MCH: 30.8 pg (ref 26.0–34.0)
MCHC: 33.1 g/dL (ref 32.0–36.0)
MCV: 93 fL (ref 80.0–100.0)
MONO ABS: 1.2 10*3/uL — AB (ref 0.2–0.9)
Metamyelocytes Relative: 0 %
Monocytes Relative: 5 %
Myelocytes: 0 %
NEUTROS ABS: 21.9 10*3/uL — AB (ref 1.4–6.5)
NEUTROS PCT: 88 %
NRBC: 0 /100{WBCs}
OTHER: 0 %
PLATELETS: 174 10*3/uL (ref 150–440)
PROMYELOCYTES ABS: 0 %
RBC: 3.56 MIL/uL — ABNORMAL LOW (ref 3.80–5.20)
RDW: 22.9 % — AB (ref 11.5–14.5)
WBC: 24.1 10*3/uL — ABNORMAL HIGH (ref 3.6–11.0)

## 2017-02-03 LAB — COMPREHENSIVE METABOLIC PANEL
ALBUMIN: 3.3 g/dL — AB (ref 3.5–5.0)
ALK PHOS: 359 U/L — AB (ref 38–126)
ALT: 97 U/L — AB (ref 14–54)
ANION GAP: 14 (ref 5–15)
AST: 168 U/L — ABNORMAL HIGH (ref 15–41)
BUN: 6 mg/dL (ref 6–20)
CALCIUM: 7.7 mg/dL — AB (ref 8.9–10.3)
CHLORIDE: 102 mmol/L (ref 101–111)
CO2: 22 mmol/L (ref 22–32)
Creatinine, Ser: 0.7 mg/dL (ref 0.44–1.00)
GFR calc Af Amer: >60 mL/min (ref >60)
GFR calc non Af Amer: >60 mL/min (ref >60)
GLUCOSE: 112 mg/dL — AB (ref 65–99)
Potassium: 3 mmol/L — ABNORMAL LOW (ref 3.5–5.1)
SODIUM: 138 mmol/L (ref 135–145)
Total Bilirubin: 3.8 mg/dL — ABNORMAL HIGH (ref 0.3–1.2)
Total Protein: 6.6 g/dL (ref 6.5–8.1)

## 2017-02-03 LAB — URINALYSIS, COMPLETE (UACMP) WITH MICROSCOPIC
BACTERIA UA: NONE SEEN
BILIRUBIN URINE: NEGATIVE
Glucose, UA: NEGATIVE mg/dL
Hgb urine dipstick: NEGATIVE
Ketones, ur: 20 mg/dL — AB
LEUKOCYTES UA: NEGATIVE
NITRITE: NEGATIVE
PROTEIN: NEGATIVE mg/dL
RBC / HPF: NONE SEEN RBC/hpf (ref 0–5)
SPECIFIC GRAVITY, URINE: 1.008 (ref 1.005–1.030)
SQUAMOUS EPITHELIAL / LPF: NONE SEEN
pH: 9 — ABNORMAL HIGH (ref 5.0–8.0)

## 2017-02-03 LAB — LACTIC ACID, PLASMA: LACTIC ACID, VENOUS: 1.7 mmol/L (ref 0.5–1.9)

## 2017-02-03 LAB — TROPONIN I: TROPONIN I: 0.05 ng/mL — AB (ref <0.03)

## 2017-02-03 MED ORDER — CIPROFLOXACIN IN D5W 400 MG/200ML IV SOLN
400.0000 mg | Freq: Once | INTRAVENOUS | Status: AC
  Administered 2017-02-03: 400 mg via INTRAVENOUS
  Filled 2017-02-03: qty 200

## 2017-02-03 MED ORDER — PROMETHAZINE HCL 25 MG/ML IJ SOLN
25.0000 mg | Freq: Once | INTRAMUSCULAR | Status: AC
  Administered 2017-02-03: 25 mg via INTRAVENOUS
  Filled 2017-02-03: qty 1

## 2017-02-03 MED ORDER — SODIUM CHLORIDE 0.9 % IV BOLUS (SEPSIS)
1000.0000 mL | Freq: Once | INTRAVENOUS | Status: AC
  Administered 2017-02-03: 1000 mL via INTRAVENOUS

## 2017-02-03 MED ORDER — FENTANYL CITRATE (PF) 100 MCG/2ML IJ SOLN
50.0000 ug | Freq: Once | INTRAMUSCULAR | Status: AC
  Administered 2017-02-03: 50 ug via INTRAVENOUS
  Filled 2017-02-03: qty 2

## 2017-02-03 MED ORDER — FENTANYL CITRATE (PF) 100 MCG/2ML IJ SOLN
50.0000 ug | Freq: Once | INTRAMUSCULAR | Status: AC
Start: 2017-02-03 — End: 2017-02-03
  Administered 2017-02-03: 50 ug via INTRAVENOUS
  Filled 2017-02-03: qty 2

## 2017-02-03 MED ORDER — METOCLOPRAMIDE HCL 5 MG/ML IJ SOLN
10.0000 mg | Freq: Once | INTRAMUSCULAR | Status: AC
  Administered 2017-02-03: 10 mg via INTRAVENOUS
  Filled 2017-02-03: qty 2

## 2017-02-03 MED ORDER — GADOBENATE DIMEGLUMINE 529 MG/ML IV SOLN
16.0000 mL | Freq: Once | INTRAVENOUS | Status: AC | PRN
  Administered 2017-02-03: 16 mL via INTRAVENOUS

## 2017-02-03 MED ORDER — METRONIDAZOLE IN NACL 5-0.79 MG/ML-% IV SOLN
500.0000 mg | Freq: Once | INTRAVENOUS | Status: AC
  Administered 2017-02-03: 500 mg via INTRAVENOUS
  Filled 2017-02-03: qty 100

## 2017-02-03 NOTE — ED Provider Notes (Signed)
[facility] Emergency Department Provider Note ____________________________________________   I have reviewed the triage vital signs and the triage nursing note.  HISTORY  Chief Complaint Emesis   Historian Patient and husband with Spanish interpreter  HPI Melinda Tanner is a 46 y.o. female with a history of widely metastatic breast cancer, follows with Dr. Mike Gip with oncology, last chemo 2 weeks ago, Neulasta last week, presents with nausea vomiting since yesterday which is moderate to severe.  She is also having some left abdomen/flank pain which is new from prior chronic pains.  No reported fevers.  No black or bloody stools.  No bloody emesis.  Nausea is severe.  She is unable to take Zofran.  She did try Phenergan at home.  States that she tolerates fentanyl well, but morphine makes her nauseated.   Past Medical History:  Diagnosis Date  . Brain cancer ([place]) 2012   Met. from Breast  . Breast cancer ([place]) 2009  . Complication of anesthesia    nausea, "drops in potassium and magnesium"  . Seizures [facility])     Patient Active Problem List   Diagnosis Date Noted  . Hypokalemia 01/28/2017  . Malignant neoplasm metastatic to right lung ([place]) 12/02/2016  . Malignant neoplasm metastatic to pancreas ([place]) 12/02/2016  . 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 Liver metastases ([place]) 12/02/2016  . Encounter for antineoplastic chemotherapy 10/08/2016  . Goals of care, counseling/discussion 08/27/2016  . B12 deficiency 10/25/2015  . Anemia 10/18/2015  . Reflux 07/01/2015  . Postoperative nausea and vomiting 04/23/2015  . 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 Bone metastasis ([place]) 04/22/2015  . 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 Blepharitis 02/03/2015  . Paralytic ptosis of right eyelid 02/03/2015  . Presbyopia 02/03/2015  . Meibomian gland disease 02/03/2015  .  Encounter for prophylactic removal of breast 12/18/2014  . Encounter for prophylactic surgery for risk factor related to malignant neoplasm of breast 12/18/2014  . H/O dysmenorrhea 10/27/2014  . Brain lesion 08/25/2014  . Metastatic breast cancer ([place]) 06/25/2014  . Brain metastases ([place]) 03/01/2011    Past Surgical History:  Procedure Laterality Date  . BRAIN SURGERY  2012, 2106  . CESAREAN SECTION    . LAPAROSCOPIC BILATERAL SALPINGO OOPHORECTOMY Bilateral 05/18/2015   Performed by Will Bonnet, MD at [facility] ORS  . MASTECTOMY Bilateral 2010    Prior to Admission medications   Medication Sig Start Date End Date Taking? Authorizing Provider  megestrol (MEGACE) 400 MG/10ML suspension Take 5 mLs (200 mg total) by mouth daily. 01/16/17  Yes Karen Kitchens, NP  pantoprazole (PROTONIX) 20 MG tablet Take 1 tablet by mouth daily 09/29/16  Yes Corcoran, Drue Second, MD  CALCIUM-VITAMIN D PO Take 1 tablet by mouth daily.    [provider]  capecitabine (XELODA) 150 MG tablet  09/19/16   [provider]  Cyanocobalamin (VITAMIN B-12 PO) Take by mouth.    [provider]  dexamethasone (DECADRON) 4 MG tablet Take 1 tablet (4 mg total) by mouth daily. 11/02/16   Noreene Filbert, MD  docusate sodium (COLACE) 100 MG capsule Take 1 tablet once or twice daily as needed for constipation while taking narcotic pain medicine Patient not taking: Reported on 12/25/2016 06/07/16   Hinda Kehr, MD  ibuprofen (ADVIL,MOTRIN) 800 MG tablet Take 800 mg by mouth every 8 (eight) hours as needed (Takes 1/2 tablet prn).    [provider]  LORazepam (ATIVAN) 0.5 MG tablet 1/2-1 tab (0.45m to 0.549m every 6 hours PRN nausea 12/27/16   GrKaren KitchensNP  Multiple Vitamins-Minerals (CENTRUM ADULTS PO) Take 1 tablet by mouth daily.    [provider]  omeprazole (PRILOSEC) 20 MG capsule Take 20 mg by mouth daily. 09/07/15   [provider]  potassium chloride SA  (K-DUR,KLOR-CON) 20 MEQ tablet Take 1 tablet (20 mEq total) by mouth daily. Patient not taking: Reported on 01/16/2017 01/02/17   GrKaren KitchensNP  promethazine (PHENERGAN) 12.5 MG tablet Take 1 tablet (12.5 mg total) by mouth every 6 (six) hours as needed for nausea or vomiting. Patient not taking: Reported on 02/03/2017 01/16/17   GrKaren KitchensNP  traMADol (ULTRAM) 50 MG tablet Take 1 tablet (50 mg total) by mouth every 12 (twelve) hours as needed. Patient not taking: Reported on 01/16/2017 12/27/16   GrKaren KitchensNP    Allergies  Allergen Reactions  . Taxol [Paclitaxel] Anaphylaxis  . Aspirin Swelling  . Penicillins Swelling  . Zofran [Ondansetron Hcl] Nausea And Vomiting    Family History  Problem Relation Age of Onset  . Cancer Paternal Aunt   . Cancer Paternal Uncle   . Cancer Paternal Grandfather   . Hypertension Brother   . Diabetes Paternal Grandmother     Social History Social History   Tobacco Use  . Smoking status: Never Smoker  . Smokeless tobacco: Never Used  Substance Use Topics  . Alcohol use: No  . Drug use: No    Review of Systems  Constitutional: Negative for fever. Eyes: Negative for visual changes. ENT: Negative for sore throat. Cardiovascular: Negative for chest pain. Respiratory: Negative for shortness of breath. Gastrointestinal: Positive for nausea and vomiting left-sided flank and lower abdominal pains.  No constipation or diarrhea. Genitourinary: Negative for dysuria. Musculoskeletal: Negative for back pain. Skin: Negative for rash. Neurological: Negative for headache.  ____________________________________________   PHYSICAL EXAM:  VITAL SIGNS: ED Triage Vitals  Enc Vitals Group     BP 02/03/17 1051 114/81     Pulse Rate 02/03/17 1051 (!) 104     Resp 02/03/17 1051 16     Temp 02/03/17 1051 98.6 F (37 C)     Temp Source 02/03/17 1051 Oral     SpO2 02/03/17 1051 100 %     Weight 02/03/17 1051 130 lb (59 kg)     Height  02/03/17 1051 5' 3"  (1.6 m)     Head Circumference --      Peak Flow --      Pain Score 02/03/17 1056 8     Pain Loc --      Pain Edu? --      Excl. in [place]--      Constitutional: Alert and oriented.  She is thin and pale and looks like she does not feel well, she is tearful and is having intermittent heaving. HEENT   Head: Normocephalic and atraumatic.      Eyes: Conjunctivae are normal. Pupils equal and round.       Ears:         Nose: No congestion/rhinnorhea.   Mouth/Throat: Mucous membranes are moderately dry.   Neck: No stridor. Cardiovascular/Chest: Normal rate, regular rhythm.  No murmurs, rubs, or gallops. Respiratory: Normal respiratory effort without tachypnea nor retractions. Breath sounds are clear and equal bilaterally. No wheezes/rales/rhonchi. Gastrointestinal: Soft. No distention, no guarding, no rebound. Nontender.    Genitourinary/rectal:Deferred Musculoskeletal:  Nontender with normal range of motion in all extremities. No joint effusions.  No lower extremity tenderness.  No edema. Neurologic:  Normal speech and language. No gross or focal neurologic deficits are appreciated. Skin:  Skin is warm, dry and intact. No rash noted. Psychiatric: Mood and affect are normal. Speech and behavior are normal. Patient exhibits appropriate insight and judgment.   ____________________________________________  LABS (pertinent positives/negatives) I, Lisa Roca, MD the attending physician have reviewed the labs noted below.  Labs Reviewed  URINALYSIS, COMPLETE (UACMP) WITH MICROSCOPIC - Abnormal; Notable for the following components:      Result Value   Color, Urine YELLOW (*)    APPearance CLEAR (*)    pH 9.0 (*)    Ketones, ur 20 (*)    All other components within normal limits  COMPREHENSIVE METABOLIC PANEL - Abnormal; Notable for the following components:   Potassium 3.0 (*)    Glucose, Bld 112 (*)    Calcium 7.7 (*)    Albumin 3.3 (*)    AST 168 (*)     ALT 97 (*)    Alkaline Phosphatase 359 (*)    Total Bilirubin 3.8 (*)    All other components within normal limits  CBC WITH DIFFERENTIAL/PLATELET - Abnormal; Notable for the following components:   WBC 24.1 (*)    RBC 3.56 (*)    Hemoglobin 11.0 (*)    HCT 33.1 (*)    RDW 22.9 (*)    Neutro Abs 21.9 (*)    Monocytes Absolute 1.2 (*)    All other components within normal limits  TROPONIN I - Abnormal; Notable for the following components:   Troponin I 0.05 (*)    All other components within normal limits  URINE CULTURE  CULTURE, BLOOD (ROUTINE X 2)  CULTURE, BLOOD (ROUTINE X 2)  LACTIC ACID, PLASMA  LACTIC ACID, PLASMA    ____________________________________________    EKG I, Lisa Roca, MD, the attending physician have personally viewed and interpreted all ECGs.  79 bpm normal sinus rhythm.  Narrow QS.  Normal axis.  Nonspecific ST and T wave ____________________________________________  RADIOLOGY All Xrays were viewed by me.  Imaging interpreted by Radiologist, and I, Lisa Roca, MD the attending physician have reviewed the radiologist interpretation noted below.  Right upper quadrant ultrasound:  FINDINGS: Gallbladder:  Gallbladder wall thickening measures 4 mm. Shadowing stones are present at the fundus, measuring up to 1.2 cm. There is sludge. No sonographic Murphy's sign is reported. However, the patient has received pain medication.  Common bile duct:  Diameter: 5.0 mm, within normal limits.  Liver:  Multiple heterogeneous hypoechoic lesions are again seen. The lesion posteriorly in the right frontal lobe measures 4.0 x 4.5 x 4.1 cm. More laterally in the right lobe the lesion measures 3.6 x 3.3 x 3.0 cm. The lesion in the left lobe measures 2.4 x 1.8 x 2.6 cm. These lesions are similar to the prior ultrasound. Portal vein is patent on color Doppler imaging with normal direction of blood flow towards the liver.  IMPRESSION: 1.  Gallbladder wall thickening and 1.2 cm gallstone suggesting acute cholecystitis. 2. Multiple metastatic lesions again noted within the liver. __________________________________________  PROCEDURES  Procedure(s) performed: None  Critical Care performed: CRITICAL CARE Performed by: Lisa Roca   Total critical care time: 60 minutes  Critical care time was exclusive of separately billable procedures and treating other patients.  Critical care was necessary to treat or prevent imminent or life-threatening deterioration.  Critical care  was time spent personally by me on the following activities: development of treatment plan with patient and/or surrogate as well as nursing, discussions with consultants, evaluation of patient's response to treatment, examination of patient, obtaining history from patient or surrogate, ordering and performing treatments and interventions, ordering and review of laboratory studies, ordering and review of radiographic studies, pulse oximetry and re-evaluation of patient's condition.    ____________________________________________  ED COURSE / ASSESSMENT AND PLAN  Pertinent labs & imaging results that were available during my care of the patient were reviewed by me and considered in my medical decision making (see chart for details).   Patient looks like she feels very bad, will access port in for IV fluids as well as fentanyl and Phenergan.  Laboratory studies show elevated white blood cell count and abnormal LFTs with elevated bili.  I am adding on blood culture although she has not had fever and does not have hypotension.  Also add on lactate.  Going to have her imaged with right upper quadrant ultrasound initially.  Patient got symptomatic relief after Phenergan and then Reglan, as well as a dose of fentanyl.  She received IV fluids.  Laboratory studies show elevated white blood cell count, unclear how much of this is related to recent Neulasta, versus  possibility for ascending cholangitis.  Will cover with Cipro and Flagyl given penicillin allergy.  I spoke with on-call surgeon Dr. Hampton Abbot given ultrasound results showing gallbladder wall thickening concerning for cholecystitis as well as gallstone and sludge, in combination with elevated LFTs showing obstructive pattern, he is concerned patient needs urgent further evaluation/treatment with ERCP, and there is no GI available for ERCP here.  She does not appear to be septic at this point.  Blood cultures have been sent.  I discussed the results with the patient and spouse, and discussed transfer to [facility].  Spoke with [facility] gastroenterologist, who does not feel clear ductal obstruction and does not recommend transfer with ercp - but rather admit and further treatment for cholecystitis and mrcp. If evidence requires transfer at that point, patient can be transferred.  Attempted discussion with [facility] GI Dr Bonna Gains,  Who is in emergency procedure.  Discussed with Dr. Hampton Abbot -- requests MRCP in ER to help with dispo - out of concern that if obstruction, very concerned about the delay in getting patient transferred after the fact.  Patient care transferred to Dr. Burlene Arnt at shift change 4 PM.  Awaiting MRCP results -- dispo per result-- surgery [facility] vs. Transfer for ERCP.  CONSULTATIONS: [facility] surgeon, Dr. Hampton Abbot by phone.  [facility] gastroenterologist.  [facility] GI.  [place] hospitalist.   Patient / Family / Caregiver informed of clinical course, medical decision-making process, and agree with plan.   ___________________________________________   FINAL CLINICAL IMPRESSION(S) / ED DIAGNOSES   Final diagnoses:  Elevated LFTs  Cholecystitis  Gallstones      ___________________________________________  ED Discharge Orders    None            Note: This dictation was prepared with Dragon dictation. Any transcriptional errors that result from  this process are unintentional    Lisa Roca, MD 02/03/17 1555

## 2017-02-03 NOTE — ED Notes (Signed)
Helped pt to bathroom. 

## 2017-02-03 NOTE — ED Notes (Signed)
Pt having increased nausea at this time.  With some wretching Dr Reita Cliche informed.  Order for reglan IV

## 2017-02-03 NOTE — ED Notes (Signed)
+   trop 0.05.   Dr Reita Cliche informed. No  New orders

## 2017-02-03 NOTE — ED Provider Notes (Addendum)
-----------------------------------------   6:48 PM on 02/03/2017 -----------------------------------------  Signed out to me this evening awaiting MRI.  Per Dr. Reita Cliche as we do not have the capacity to do ERCP here b/c of the coverage for GI, if the MRCP mandates need for ERCP, she will need to be transferred.   Per Dr. Hampton Abbot, pt will likely need a stent, and will require transfer a place that we will do so he did look at the MRCP.  I have talked to the family but really would like to be transferred and they are requesting [facility] close to their house.  I will talk to [facility] physicians.   ----------------------------------------- 7:03 PM on 02/03/2017 -----------------------------------------  I do note that the patient has had elevated LFTs in the past I talked to Dr. Grayland Ormond, however given MRCP findings, he does feel that at this point we have no choice but to transfer the patient, he does not primarily taking care of this patient is not clear exactly what other is her have been for elevated liver functions on this patient in the past.  In addition, family does consent to transfer and again would prefer to go to [facility].  In addition, the patient states that if an operation should be required, she would want to have it done.  ----------------------------------------- 7:59 PM on 02/03/2017 -----------------------------------------  I was able to talk to Dr. Jani Gravel who will accept the patient in transfer to his service, he did want me to talk to GI medicine to make sure that they agreed with transfer to [facility].  I discussed therefore with [facility] gastroenterologist Dr. Scarlette Shorts, who also was kind enough to accept the patient in transfer and agrees with management thus far.  Given therefore that the hospitalist at [facility] and the GI specialist, and her oncologist and the local surgeon and, per report, the local GI physician as well as the patient all agree with this plan we  will transfer the patient to [facility].  ----------------------------------------- 8:41 PM on 02/03/2017 -----------------------------------------  Singed out to dr. Joni Fears at the end of my shift.   Schuyler Amor, MD 02/03/17 Yevette Edwards    Schuyler Amor, MD 02/03/17 1904    Schuyler Amor, MD 02/03/17 Despina Pole    Schuyler Amor, MD 02/03/17 [phone]

## 2017-02-03 NOTE — Progress Notes (Signed)
[place] ED, requesting admission at [facility], MRCP=> bililiary duct distention due to metastatic disease needs ERCP I requested that [place] ED speak with GI to make sure that they are able to provide the service requested and if so medicine will most certain accept the patient to a Stepdown bed.  If GI is unable to provide the service requested then [facility] ED will make other arrangements for the patient.

## 2017-02-03 NOTE — ED Triage Notes (Signed)
Pt reports multiple episodes of vomiting last night, took phenergan, pt is out of medication for vomiting.   Pt also c/o left flank pain which is a new pain.  Pt has had increased weakness over the past 24 hours.  Pt vomited a jello liquid a few days ago which is new for her.  Pt has metastatic cancer to bone and brain, liver.    Pt has port a cath.  Last chemo was 2 weeks, next treatment due is Tuesday.

## 2017-02-04 ENCOUNTER — Encounter (HOSPITAL_COMMUNITY): Payer: Self-pay | Admitting: Internal Medicine

## 2017-02-04 ENCOUNTER — Inpatient Hospital Stay (HOSPITAL_COMMUNITY)
Admission: AD | Admit: 2017-02-04 | Discharge: 2017-02-10 | DRG: 444 | Disposition: A | Discharge status: Hospice | Payer: Medicare HMO | Source: Other Acute Inpatient Hospital | Attending: Internal Medicine | Admitting: Internal Medicine

## 2017-02-04 DIAGNOSIS — R110 Nausea: Secondary | ICD-10-CM | POA: Diagnosis not present

## 2017-02-04 DIAGNOSIS — Z79899 Other long term (current) drug therapy: Secondary | ICD-10-CM | POA: Diagnosis not present

## 2017-02-04 DIAGNOSIS — C7951 Secondary malignant neoplasm of bone: Secondary | ICD-10-CM | POA: Diagnosis not present

## 2017-02-04 DIAGNOSIS — R05 Cough: Secondary | ICD-10-CM

## 2017-02-04 DIAGNOSIS — C7931 Secondary malignant neoplasm of brain: Secondary | ICD-10-CM | POA: Diagnosis not present

## 2017-02-04 DIAGNOSIS — E876 Hypokalemia: Secondary | ICD-10-CM | POA: Diagnosis present

## 2017-02-04 DIAGNOSIS — R17 Unspecified jaundice: Secondary | ICD-10-CM | POA: Diagnosis not present

## 2017-02-04 DIAGNOSIS — R1011 Right upper quadrant pain: Secondary | ICD-10-CM | POA: Diagnosis not present

## 2017-02-04 DIAGNOSIS — Z6824 Body mass index (BMI) 24.0-24.9, adult: Secondary | ICD-10-CM

## 2017-02-04 DIAGNOSIS — Z853 Personal history of malignant neoplasm of breast: Secondary | ICD-10-CM | POA: Diagnosis not present

## 2017-02-04 DIAGNOSIS — N136 Pyonephrosis: Secondary | ICD-10-CM | POA: Diagnosis present

## 2017-02-04 DIAGNOSIS — R7401 Elevation of levels of liver transaminase levels: Secondary | ICD-10-CM

## 2017-02-04 DIAGNOSIS — C50919 Malignant neoplasm of unspecified site of unspecified female breast: Secondary | ICD-10-CM | POA: Diagnosis not present

## 2017-02-04 DIAGNOSIS — Z66 Do not resuscitate: Secondary | ICD-10-CM | POA: Diagnosis not present

## 2017-02-04 DIAGNOSIS — K7689 Other specified diseases of liver: Secondary | ICD-10-CM | POA: Diagnosis not present

## 2017-02-04 DIAGNOSIS — R112 Nausea with vomiting, unspecified: Secondary | ICD-10-CM | POA: Diagnosis not present

## 2017-02-04 DIAGNOSIS — Z515 Encounter for palliative care: Secondary | ICD-10-CM | POA: Diagnosis not present

## 2017-02-04 DIAGNOSIS — Z888 Allergy status to other drugs, medicaments and biological substances status: Secondary | ICD-10-CM

## 2017-02-04 DIAGNOSIS — K819 Cholecystitis, unspecified: Secondary | ICD-10-CM | POA: Diagnosis present

## 2017-02-04 DIAGNOSIS — I3139 Other pericardial effusion (noninflammatory): Secondary | ICD-10-CM

## 2017-02-04 DIAGNOSIS — N200 Calculus of kidney: Secondary | ICD-10-CM | POA: Diagnosis not present

## 2017-02-04 DIAGNOSIS — Z880 Allergy status to penicillin: Secondary | ICD-10-CM | POA: Diagnosis not present

## 2017-02-04 DIAGNOSIS — R000 Tachycardia, unspecified: Secondary | ICD-10-CM | POA: Diagnosis not present

## 2017-02-04 DIAGNOSIS — C787 Secondary malignant neoplasm of liver and intrahepatic bile duct: Secondary | ICD-10-CM | POA: Diagnosis not present

## 2017-02-04 DIAGNOSIS — K5900 Constipation, unspecified: Secondary | ICD-10-CM | POA: Diagnosis not present

## 2017-02-04 DIAGNOSIS — K831 Obstruction of bile duct: Secondary | ICD-10-CM | POA: Diagnosis not present

## 2017-02-04 DIAGNOSIS — G40909 Epilepsy, unspecified, not intractable, without status epilepticus: Secondary | ICD-10-CM | POA: Diagnosis not present

## 2017-02-04 DIAGNOSIS — K8043 Calculus of bile duct with acute cholecystitis with obstruction: Secondary | ICD-10-CM | POA: Diagnosis not present

## 2017-02-04 DIAGNOSIS — R059 Cough, unspecified: Secondary | ICD-10-CM

## 2017-02-04 DIAGNOSIS — Z886 Allergy status to analgesic agent status: Secondary | ICD-10-CM

## 2017-02-04 DIAGNOSIS — K810 Acute cholecystitis: Secondary | ICD-10-CM | POA: Diagnosis not present

## 2017-02-04 DIAGNOSIS — R740 Nonspecific elevation of levels of transaminase and lactic acid dehydrogenase [LDH]: Secondary | ICD-10-CM

## 2017-02-04 DIAGNOSIS — E43 Unspecified severe protein-calorie malnutrition: Secondary | ICD-10-CM | POA: Diagnosis not present

## 2017-02-04 DIAGNOSIS — K8020 Calculus of gallbladder without cholecystitis without obstruction: Secondary | ICD-10-CM | POA: Diagnosis not present

## 2017-02-04 DIAGNOSIS — R5383 Other fatigue: Secondary | ICD-10-CM | POA: Diagnosis not present

## 2017-02-04 DIAGNOSIS — I313 Pericardial effusion (noninflammatory): Secondary | ICD-10-CM | POA: Diagnosis present

## 2017-02-04 DIAGNOSIS — Z7189 Other specified counseling: Secondary | ICD-10-CM | POA: Diagnosis not present

## 2017-02-04 DIAGNOSIS — G893 Neoplasm related pain (acute) (chronic): Secondary | ICD-10-CM | POA: Diagnosis not present

## 2017-02-04 HISTORY — DX: Pericardial effusion (noninflammatory): I31.3

## 2017-02-04 LAB — CBC WITH DIFFERENTIAL/PLATELET
BAND NEUTROPHILS: 2 %
BASOS PCT: 1 %
Basophils Absolute: 0.2 10*3/uL — ABNORMAL HIGH (ref 0–0.1)
Blasts: 0 %
Eosinophils Absolute: 0 10*3/uL (ref 0–0.7)
Eosinophils Relative: 0 %
HEMATOCRIT: 32.5 % — AB (ref 35.0–47.0)
Hemoglobin: 10.6 g/dL — ABNORMAL LOW (ref 12.0–16.0)
LYMPHS ABS: 0.2 10*3/uL — AB (ref 1.0–3.6)
Lymphocytes Relative: 1 %
MCH: 31 pg (ref 26.0–34.0)
MCHC: 32.7 g/dL (ref 32.0–36.0)
MCV: 94.7 fL (ref 80.0–100.0)
METAMYELOCYTES PCT: 1 %
Monocytes Absolute: 0.5 10*3/uL (ref 0.2–0.9)
Monocytes Relative: 2 %
Myelocytes: 1 %
NEUTROS ABS: 22.8 10*3/uL — AB (ref 1.4–6.5)
NRBC: 0 /100{WBCs}
Neutrophils Relative %: 92 %
OTHER: 0 %
PLATELETS: 151 10*3/uL (ref 150–440)
Promyelocytes Absolute: 0 %
RBC: 3.43 MIL/uL — ABNORMAL LOW (ref 3.80–5.20)
RDW: 23.7 % — AB (ref 11.5–14.5)
WBC: 23.7 10*3/uL — ABNORMAL HIGH (ref 3.6–11.0)

## 2017-02-04 LAB — COMPREHENSIVE METABOLIC PANEL
ALBUMIN: 2.9 g/dL — AB (ref 3.5–5.0)
ALK PHOS: 387 U/L — AB (ref 38–126)
ALT: 111 U/L — AB (ref 14–54)
AST: 164 U/L — ABNORMAL HIGH (ref 15–41)
Anion gap: 11 (ref 5–15)
BILIRUBIN TOTAL: 6 mg/dL — AB (ref 0.3–1.2)
BUN: 6 mg/dL (ref 6–20)
CALCIUM: 7.1 mg/dL — AB (ref 8.9–10.3)
CO2: 20 mmol/L — AB (ref 22–32)
CREATININE: 0.69 mg/dL (ref 0.44–1.00)
Chloride: 107 mmol/L (ref 101–111)
GFR calc non Af Amer: >60 mL/min (ref >60)
GLUCOSE: 65 mg/dL (ref 65–99)
Potassium: 3.3 mmol/L — ABNORMAL LOW (ref 3.5–5.1)
SODIUM: 138 mmol/L (ref 135–145)
TOTAL PROTEIN: 6.2 g/dL — AB (ref 6.5–8.1)

## 2017-02-04 LAB — TROPONIN I

## 2017-02-04 LAB — D-DIMER, QUANTITATIVE: D-Dimer, Quant: 1.86 ug/mL-FEU — ABNORMAL HIGH (ref 0.00–0.50)

## 2017-02-04 LAB — MAGNESIUM: Magnesium: 1.6 mg/dL — ABNORMAL LOW (ref 1.7–2.4)

## 2017-02-04 LAB — MRSA PCR SCREENING: MRSA by PCR: NEGATIVE

## 2017-02-04 MED ORDER — PROMETHAZINE HCL 25 MG/ML IJ SOLN
12.5000 mg | Freq: Once | INTRAMUSCULAR | Status: AC
  Administered 2017-02-04: 12.5 mg via INTRAVENOUS
  Filled 2017-02-04: qty 1

## 2017-02-04 MED ORDER — CIPROFLOXACIN IN D5W 400 MG/200ML IV SOLN
400.0000 mg | Freq: Two times a day (BID) | INTRAVENOUS | Status: DC
  Administered 2017-02-04 – 2017-02-09 (×10): 400 mg via INTRAVENOUS
  Filled 2017-02-04 (×10): qty 200

## 2017-02-04 MED ORDER — CIPROFLOXACIN IN D5W 400 MG/200ML IV SOLN
400.0000 mg | Freq: Once | INTRAVENOUS | Status: AC
  Administered 2017-02-04: 400 mg via INTRAVENOUS
  Filled 2017-02-04: qty 200

## 2017-02-04 MED ORDER — POTASSIUM CHLORIDE CRYS ER 20 MEQ PO TBCR
20.0000 meq | EXTENDED_RELEASE_TABLET | Freq: Once | ORAL | Status: AC
  Administered 2017-02-04: 20 meq via ORAL
  Filled 2017-02-04: qty 1

## 2017-02-04 MED ORDER — ACETAMINOPHEN 650 MG RE SUPP: 650.0000 mg | Freq: Four times a day (QID) | RECTAL | Status: DC | PRN

## 2017-02-04 MED ORDER — SODIUM CHLORIDE 0.9 % IV SOLN
Freq: Once | INTRAVENOUS | Status: AC
Start: 2017-02-04 — End: 2017-02-04
  Administered 2017-02-04: 15:00:00 via INTRAVENOUS

## 2017-02-04 MED ORDER — METRONIDAZOLE IN NACL 5-0.79 MG/ML-% IV SOLN
500.0000 mg | Freq: Once | INTRAVENOUS | Status: AC
  Administered 2017-02-04: 500 mg via INTRAVENOUS
  Filled 2017-02-04: qty 100

## 2017-02-04 MED ORDER — METOCLOPRAMIDE HCL 5 MG/ML IJ SOLN
10.0000 mg | Freq: Once | INTRAMUSCULAR | Status: AC
  Administered 2017-02-04: 10 mg via INTRAVENOUS
  Filled 2017-02-04: qty 2

## 2017-02-04 MED ORDER — SODIUM CHLORIDE 0.9 % IV SOLN
Freq: Once | INTRAVENOUS | Status: AC
  Administered 2017-02-04: 125 mL/h via INTRAVENOUS

## 2017-02-04 MED ORDER — METRONIDAZOLE IN NACL 5-0.79 MG/ML-% IV SOLN
500.0000 mg | Freq: Three times a day (TID) | INTRAVENOUS | Status: DC
  Administered 2017-02-05 – 2017-02-09 (×15): 500 mg via INTRAVENOUS
  Filled 2017-02-04 (×15): qty 100

## 2017-02-04 MED ORDER — METRONIDAZOLE IN NACL 5-0.79 MG/ML-% IV SOLN: 500.0000 mg | Freq: Three times a day (TID) | INTRAVENOUS | Status: DC

## 2017-02-04 MED ORDER — SIMETHICONE 80 MG PO CHEW
80.0000 mg | CHEWABLE_TABLET | ORAL | Status: DC | PRN
  Filled 2017-02-04: qty 1

## 2017-02-04 MED ORDER — PROCHLORPERAZINE EDISYLATE 5 MG/ML IJ SOLN
10.0000 mg | Freq: Four times a day (QID) | INTRAMUSCULAR | Status: DC | PRN
  Administered 2017-02-05 – 2017-02-06 (×2): 10 mg via INTRAVENOUS
  Filled 2017-02-04 (×3): qty 2

## 2017-02-04 MED ORDER — PANTOPRAZOLE SODIUM 40 MG PO TBEC
40.0000 mg | DELAYED_RELEASE_TABLET | Freq: Every day | ORAL | Status: DC
  Administered 2017-02-04 – 2017-02-07 (×3): 40 mg via ORAL
  Filled 2017-02-04 (×4): qty 1

## 2017-02-04 MED ORDER — ENOXAPARIN SODIUM 40 MG/0.4ML ~~LOC~~ SOLN
40.0000 mg | SUBCUTANEOUS | Status: DC
  Administered 2017-02-04 – 2017-02-09 (×6): 40 mg via SUBCUTANEOUS
  Filled 2017-02-04 (×6): qty 0.4

## 2017-02-04 MED ORDER — POTASSIUM CHLORIDE IN NACL 20-0.9 MEQ/L-% IV SOLN
INTRAVENOUS | Status: DC
  Filled 2017-02-04 (×2): qty 1000

## 2017-02-04 MED ORDER — ACETAMINOPHEN 325 MG PO TABS: 650.0000 mg | ORAL_TABLET | Freq: Four times a day (QID) | ORAL | Status: DC | PRN

## 2017-02-04 MED ORDER — SODIUM CHLORIDE 0.9 % IV BOLUS (SEPSIS)
500.0000 mL | Freq: Once | INTRAVENOUS | Status: AC
  Administered 2017-02-04: 500 mL via INTRAVENOUS

## 2017-02-04 NOTE — Consult Note (Addendum)
Reason for Consult:Obstructive jaundice, metastatic breast cancer Referring Physician: Beatriz Stallion Melinda Tanner is an 46 y.o. female.  HPI: Very pleasant, but unfortunate 46 year old female with widely metastatic breast cancer to the brain, bone, liver, cervical lymph nodes and possibly the pancreas who presents with right upper quadrant pain and tenderness and obstructive jaundice.  The patient has been having pain and symptoms for the past several week and noticed her urine becoming darker over the last 3 weeks.  She has has no known fevers, but has had multiple episodes of chills.Ultrasound and CT scan suggest acute cholecystitis with thickening of the GB wall and pericholecystic fluid.  She also has some gallstones.  MRCP done at [facility] did not demonstrate CBD stones, but her duct was possible partially obstructed by tumor.  She was transferred to [facility] for complex GI intervention and possible surgery.  Past Medical History:  Diagnosis Date  . Brain cancer ([place]) 2012   Met. from Breast  . Breast cancer ([place]) 2009  . Complication of anesthesia    nausea, "drops in potassium and magnesium"  . Seizures ([place])     Past Surgical History:  Procedure Laterality Date  . BRAIN SURGERY  2012, 2106  . CESAREAN SECTION    . LAPAROSCOPIC BILATERAL SALPINGO OOPHORECTOMY Bilateral 05/18/2015   Performed by Will Bonnet, MD at [facility] ORS  . MASTECTOMY Bilateral 2010    Family History  Problem Relation Age of Onset  . Cancer Paternal Aunt   . Cancer Paternal Uncle   . Cancer Paternal Grandfather   . Hypertension Brother   . Diabetes Paternal Grandmother   . CAD Father     Social History:  reports that  has never smoked. she has never used smokeless tobacco. She reports that she does not drink alcohol or use drugs.  Allergies:  Allergies  Allergen Reactions  . Taxol [Paclitaxel] Anaphylaxis  . Penicillins Swelling    Has patient had a PCN reaction causing immediate rash,  facial/tongue/throat swelling, SOB or lightheadedness with hypotension: No Has patient had a PCN reaction causing severe rash involving mucus membranes or skin necrosis: No Has patient had a PCN reaction that required hospitalization: Yes Has patient had a PCN reaction occurring within the last 10 years: No If all of the above answers are "NO", then may proceed with Cephalosporin use.  **Localized swelling in the arm**   . Aspirin Swelling  . Zofran [Ondansetron Hcl] Nausea And Vomiting    Medications: I have reviewed the patient's current medications.  Results for orders placed or performed during the hospital encounter of 02/04/17 (from the past 48 hour(s))  MRSA PCR Screening     Status: None   Collection Time: 02/04/17  7:34 PM  Result Value Ref Range   MRSA by PCR NEGATIVE NEGATIVE    Comment:        The GeneXpert MRSA Assay (FDA approved for NASAL specimens only), is one component of a comprehensive MRSA colonization surveillance program. It is not intended to diagnose MRSA infection nor to guide or monitor treatment for MRSA infections.   D-dimer, quantitative (not at [facility])     Status: Abnormal   Collection Time: 02/04/17 10:50 PM  Result Value Ref Range   D-Dimer, Quant 1.86 (H) 0.00 - 0.50 ug/mL-FEU    Comment: (NOTE) At the manufacturer cut-off of 0.50 ug/mL FEU, this assay has been documented to exclude PE with a sensitivity and negative predictive value of 97 to 99%.  At  this time, this assay has not been approved by the FDA to exclude DVT/VTE. Results should be correlated with clinical presentation.     Mr Abdomen With Mrcp W Contrast  Addendum Date: 02/03/2017   ADDENDUM REPORT: 02/03/2017 18:28 ADDENDUM: As mentioned in the body of the report, there is mild left hydroureteronephrosis of indeterminate etiology, but new since 12/22/2016. Electronically Signed   By: Misty Stanley M.D.   On: 02/03/2017 18:28   Result Date: 02/03/2017 CLINICAL DATA:  Breast  cancer with metastatic disease to the liver. Abdominal pain and fever. Ultrasound earlier today suggested acute cholecystitis. EXAM: MRI ABDOMEN WITHOUT CONTRAST  (INCLUDING MRCP) TECHNIQUE: Multiplanar multisequence MR imaging of the abdomen was performed. Heavily T2-weighted images of the biliary and pancreatic ducts were obtained, and three-dimensional MRCP images were rendered by post processing. COMPARISON:  Ultrasound exam earlier today.  Abdomen CT 12/22/2016 FINDINGS: Lower chest:  Small right pleural effusion. Hepatobiliary: Multiple liver mets are identified with 1 of the more dominant lesions measuring 3.4 cm in the posterior right hepatic lobe. There is an area confluent metastatic disease in the central liver near the hepatic duct confluence measuring 3.9 x 5.4 cm. MRCP images show mild intrahepatic biliary duct dilatation with marked attenuation of the bile duct in the region of the porta hepatis. Common duct cannot be traced through the porta hepatis into the head of the pancreas and common duct inclusion in the hepatoduodenal ligament is also concern, but this is not well evaluated given motion artifact. 7 mm gallstone evident with apparent gallbladder wall thickening and/ or pericholecystic edema Pancreas: Not well seen due to motion artifact from patient difficulty with reproducible breath holding. No dilatation the main pancreatic duct. Spleen: No splenomegaly. No focal mass lesion. Adrenals/Urinary Tract: No adrenal nodule or mass. Right kidney unremarkable. There is mild left hydroureteronephrosis although full extent of the distended left ureter not visualized. Stomach/Bowel: Stomach is nondistended. No gastric wall thickening. No evidence of outlet obstruction. Duodenum is normally positioned as is the ligament of Treitz. No small bowel or colonic dilatation within the visualized abdomen. Vascular/Lymphatic: No abdominal aortic aneurysm. Probable lymphadenopathy in the porta hepatis. Other:  Small volume intraperitoneal free fluid evident. Musculoskeletal: Multiple foci of abnormal marrow enhancement compatible with bony metastatic disease. IMPRESSION: 1. Mild intrahepatic biliary duct distension with marked attenuation of the common duct in the region of the porta hepatis, likely due to metastatic disease in the central liver with metastatic lymphadenopathy in the porta hepatis also a consideration. The distal common bile duct is not well seen as study is fairly motion degraded and an occlusion of the common bile duct in the porta hepatis towards the head of pancreas is also concern. No evidence for choledocholithiasis. 2. Cholelithiasis with edema or fluid in/around the gallbladder wall. As noted on ultrasound exam earlier today, cholecystitis is a concern. 3. Bulky metastatic disease in the liver as seen on recent CT scan. Electronically Signed: By: Misty Stanley M.D. On: 02/03/2017 18:23   US Abdomen Limited Ruq  Result Date: 02/03/2017 CLINICAL DATA:  Elevated LFTs. Breast cancer with known metastatic disease to the liver. EXAM: ULTRASOUND ABDOMEN LIMITED RIGHT UPPER QUADRANT COMPARISON:  None. FINDINGS: Gallbladder: Gallbladder wall thickening measures 4 mm. Shadowing stones are present at the fundus, measuring up to 1.2 cm. There is sludge. No sonographic Murphy's sign is reported. However, the patient has received pain medication. Common bile duct: Diameter: 5.0 mm, within normal limits. Liver: Multiple heterogeneous hypoechoic lesions are again seen. The  lesion posteriorly in the right frontal lobe measures 4.0 x 4.5 x 4.1 cm. More laterally in the right lobe the lesion measures 3.6 x 3.3 x 3.0 cm. The lesion in the left lobe measures 2.4 x 1.8 x 2.6 cm. These lesions are similar to the prior ultrasound. Portal vein is patent on color Doppler imaging with normal direction of blood flow towards the liver. IMPRESSION: 1. Gallbladder wall thickening and 1.2 cm gallstone suggesting acute  cholecystitis. 2. Multiple metastatic lesions again noted within the liver. Electronically Signed   By: San Morelle M.D.   On: 02/03/2017 14:54    Review of Systems  Constitutional: Positive for chills. Negative for fever.  Cardiovascular: Positive for chest pain (rihgt chest wall).  Gastrointestinal: Positive for abdominal pain, nausea and vomiting.  All other systems reviewed and are negative.  Blood pressure 111/79, pulse 97, temperature 98.9 F (37.2 C), temperature source Oral, resp. rate (!) 24, weight 60.6 kg (133 lb 9.6 oz), last menstrual period 03/19/2015, SpO2 100 %. Physical Exam  Vitals reviewed. Constitutional: She is oriented to person, place, and time. She appears well-developed and well-nourished.  HENT:  Head: Normocephalic and atraumatic.  Eyes: EOM are normal. Pupils are equal, round, and reactive to light. Scleral icterus is present.  Neck: Normal range of motion. Neck supple.  Cardiovascular: Regular rhythm, normal heart sounds and intact distal pulses. Tachycardia present.  Respiratory: Effort normal and breath sounds normal.  GI: Soft. Normal appearance and bowel sounds are normal. There is tenderness in the right upper quadrant. There is no rigidity, no rebound and no guarding.    Musculoskeletal: Normal range of motion.  Neurological: She is alert and oriented to person, place, and time. She has normal reflexes.  Skin: Skin is warm and dry.  Psychiatric: She has a normal mood and affect. Her behavior is normal. Judgment and thought content normal.    Assessment/Plan: This is not a simple acute cholecystitis case.  The patient has obstructive jaundice, likely from her metastatic tumor in her liver and also in her porta hepatis.  Her gallbladder may be distended from distal CBD obstruction and a patent cystic duct, in which case decompression of the gallbladder could decompress her hepatobiliary system.  If her obstruction is above the entrance of the  cystic duct into the GB, the a percutaneous hepatic drain may be therapeutic.  If her cystic duct is obstructed, a percutaneous drain may provide symptomatic relief, but will not decompress her symptom and probably would not improve her hyperbilirubinemia.  Basically we need more information, and an ERCP would be helpful.  If her CBD is only partially obstructed, then a stent may be helpful.  A stent and a percutaneous GB drain may be helpful if the cystic duct is also obstructed.  Currently we are not being pressed to do anything right away, and a thoughtful, planned attack on this incurable process to provide the best palliative management should be considered--the soul also include getting a palliative care consultation.  I do not envision a scenario where cholecystectomy--either open or laparoscopically--would be the best option.  I spent an extensive amount of time with the patient and her daughter explaining the disease process, the potential treatments and on going care.  Judeth Horn 02/04/2017, 11:35 PM

## 2017-02-04 NOTE — ED Notes (Signed)
Pt reports improved nausea.

## 2017-02-04 NOTE — ED Notes (Signed)
Introduced self to pt after receiving report from [place], rn. Pt appears uncomfortable in bed. Pt is asking for update on transfer status, states it has been possibly hours since she has been checked on and updated. Pt updated with [place], rn assist regarding transfer process. Pt informed this rn will secure a hospital bed for comfort. Family informed will find a recliner for family for comfort. Pt declines offer for pain medication or nausea medication after reporting "un poquito" nausea. Pt states she is having difficulty sleeping due to uncomfortable strecher.

## 2017-02-04 NOTE — ED Notes (Signed)
EDP Secretary Edd Arbour called for bed assignment update. [facility] still waiting on pt discharge for bed availability.

## 2017-02-04 NOTE — ED Notes (Signed)
Checked on pt. Pt resting comfortably at this time.

## 2017-02-04 NOTE — ED Notes (Signed)
[facility] team present to transport pt to [facility]. All belongings given to pt's husband, present at bedside.

## 2017-02-04 NOTE — ED Notes (Signed)
Report to donald, rn.  

## 2017-02-04 NOTE — ED Notes (Signed)
Pt sitting up in bed conversing with family. Pt and family deny further needs. Pt updated that we are continuing to await bed placement for transfer. Pt verbalizes understanding.

## 2017-02-04 NOTE — ED Notes (Signed)
Mouth moisturizer, deodorant, towels, toothbrush and no rinse bath aid provided for pt hygiene. Meal tray provided with po fluids to pt's family. Recliner with blankets and pillow provided for pt's family. Hospital bed secured for pt and pt moved to hospital bed after assisted up to commode to void. Warm blankets provided to pt. Pt provided with call bell by this rn and pt instructed on how to use call bell.

## 2017-02-04 NOTE — ED Notes (Addendum)
Pt states she is nauseated. Order for phenergan received from dr. Cinda Quest. Discussed npo status and no ivf ordered. No additional orders other than phenergan received.

## 2017-02-04 NOTE — ED Notes (Signed)
Pt ambulated with steady gait to commode with standby assist. Pt reports c/o mild gas and some nausea. States she does not want any more nausea medicine right now because it makes her sleepy and she feels she has been sleeping a lot. C/o hunger, pt aware of need to remain NPO. Given TV remote and updated on bed wait.

## 2017-02-04 NOTE — ED Provider Notes (Addendum)
-----------------------------------------   3:29 PM on 02/04/2017 -----------------------------------------  Pt here still upon my return; unexpectedly waiting transport. bp reassuring. I have called [facility] and stressed need to have her transported. Apparently, there were no beds available. We have been in contact with them throughout the day, trying to get her over.  Continuing abx here. She remains afebrile. Rechecking labs.  Per [place], she will be the next person transported and they are clearing a bed for her now.   ----------------------------------------- 5:52 PM on 02/04/2017 -----------------------------------------  In nad labs noted. No intervention possible here awaiting transfer have called on the hour to [facility]. They are cleaning the bed. Stressed importance of transfer.   ----------------------------------------- 5:56 PM on 02/04/2017 -----------------------------------------  Bed is apparently still being cleaned however, they are except in the patient in transfer, transport is in route should be here in the next 20-30 minutes and we will get her over there.   Schuyler Amor, MD 02/04/17 1531    Schuyler Amor, MD 02/04/17 1638    Schuyler Amor, MD 02/04/17 [phone]

## 2017-02-04 NOTE — ED Notes (Signed)
Spoke with pt via interpreter [place]. Pt okay with receiving Reglan at this time.

## 2017-02-04 NOTE — ED Notes (Signed)
Using interpreter services, RN and MD updated patient. Provided comfort and reassurance. Patient resting comfortably. NAD noted.

## 2017-02-04 NOTE — ED Notes (Signed)
EMTALA reviewed by this RN.  

## 2017-02-04 NOTE — H&P (Signed)
TRH H&P   Patient Demographics:    Terrilee Dudzik, is a 46 y.o. female  MRN: [id number]   DOB - 1970-10-30  Admit Date - 02/04/2017  Outpatient Primary MD for the patient is Revelo, Elyse Jarvis, MD  Referring MD/NP/PA: [facility] ED, Charlotte Crumb  Outpatient Specialists: Nolon Stalls (oncology)  Patient coming from: [facility]  No chief complaint on file.   Acute cholecystitis    HPI:    Damyra Luscher  is a 46 y.o. female, w metastatic breast cancer to liver , brain , bone, seizure do apparently c/o nausea/vomitting which she initially thought was due to chemo.  She states that the nausea and vomitting became worse the day prior to Saturday and also noted some back pain/side pain on the left side.  Pt was feeling worse and therefore presented to [facility] ED on 11/17 for evaluation.   At [facility], RUQ ultrasound=>  IMPRESSION: 1. Gallbladder wall thickening and 1.2 cm gallstone suggesting acute cholecystitis. 2. Multiple metastatic lesions again noted within the liver.  MR Abdomen with MRCP => IMPRESSION: 1. Mild intrahepatic biliary duct distension with marked attenuation of the common duct in the region of the porta hepatis, likely due to metastatic disease in the central liver with metastatic lymphadenopathy in the porta hepatis also a consideration. The distal common bile duct is not well seen as study is fairly motion degraded and an occlusion of the common bile duct in the porta hepatis towards the head of pancreas is also concern. No evidence for choledocholithiasis. 2. Cholelithiasis with edema or fluid in/around the gallbladder wall. As noted on ultrasound exam earlier today, cholecystitis is a concern. 3. Bulky metastatic disease in the liver as seen on recent CT scan.  02/03/2017 Labs Wbc 24.1, Hgb  11.0, Plt 174 Na 138, K 3.0  Bun 6, Creatinine 0.7 Alb 3.3 Ast 168, Alt 97 Alk phos 359 T. Bili 3.8  Trop 0.05 ua wbc 6-30  Pt appears to have received cipro and flagyl at [facility] ED,   ED requested transfer to [facility] for evaluation by GI and surgery.  There was no ERCP capability at [facility] over the weekend and therefore requested transfer.   ED at [facility] spoke with Scarlette Shorts and GI will consult on this patient as well.      Review of systems:    In addition to the HPI above,  No Fever-chills, No Headache, No changes with Vision or hearing, No problems swallowing food or Liquids, No Chest pain, Cough or Shortness of Breath, No Abdominal pain, , No Blood in stool or Urine, No dysuria, No new skin rashes or bruises, No new joints pains-aches,  No new weakness, tingling, numbness in any extremity, No recent weight gain or loss, No polyuria, polydypsia or polyphagia, No significant Mental Stressors.  A full 10 point  Review of Systems was done, except as stated above, all other Review of Systems were negative.   With Past History of the following :    Past Medical History:  Diagnosis Date  . Brain cancer ([place]) 2012   Met. from Breast  . Breast cancer ([place]) 2009  . Complication of anesthesia    nausea, "drops in potassium and magnesium"  . Seizures ([place])       Past Surgical History:  Procedure Laterality Date  . BRAIN SURGERY  2012, 2106  . CESAREAN SECTION    . LAPAROSCOPIC BILATERAL SALPINGO OOPHORECTOMY Bilateral 05/18/2015   Performed by Will Bonnet, MD at [facility] ORS  . MASTECTOMY Bilateral 2010      Social History:     Social History   Tobacco Use  . Smoking status: Never Smoker  . Smokeless tobacco: Never Used  Substance Use Topics  . Alcohol use: No     Lives - at home  Mobility - walks by self   Family History :     Family History  Problem Relation Age of Onset  . Cancer Paternal Aunt   . Cancer  Paternal Uncle   . Cancer Paternal Grandfather   . Hypertension Brother   . Diabetes Paternal Grandmother   . CAD Father       Home Medications:   Prior to Admission medications   Medication Sig Start Date End Date Taking? Authorizing Provider  CALCIUM-VITAMIN D PO Take 1 tablet by mouth daily.    [provider]  Cyanocobalamin (VITAMIN B-12 PO) Take by mouth.    [provider]  dexamethasone (DECADRON) 4 MG tablet Take 1 tablet (4 mg total) by mouth daily. Patient not taking: Reported on 02/03/2017 11/02/16   Noreene Filbert, MD  docusate sodium (COLACE) 100 MG capsule Take 1 tablet once or twice daily as needed for constipation while taking narcotic pain medicine Patient not taking: Reported on 12/25/2016 06/07/16   Hinda Kehr, MD  ibuprofen (ADVIL,MOTRIN) 800 MG tablet Take 800 mg by mouth every 8 (eight) hours as needed (Takes 1/2 tablet prn).    [provider]  LORazepam (ATIVAN) 0.5 MG tablet 1/2-1 tab (0.88m to 0.517m every 6 hours PRN nausea 12/27/16   GrKaren KitchensNP  megestrol (MEGACE) 400 MG/10ML suspension Take 5 mLs (200 mg total) by mouth daily. 01/16/17   GrKaren KitchensNP  Multiple Vitamins-Minerals (CENTRUM ADULTS PO) Take 1 tablet by mouth daily.    [provider]  omeprazole (PRILOSEC) 20 MG capsule Take 20 mg by mouth daily. 09/07/15   [provider]  pantoprazole (PROTONIX) 20 MG tablet Take 1 tablet by mouth daily 09/29/16   CoLequita AsalMD  potassium chloride SA (K-DUR,KLOR-CON) 20 MEQ tablet Take 1 tablet (20 mEq total) by mouth daily. Patient not taking: Reported on 01/16/2017 01/02/17   GrKaren KitchensNP  promethazine (PHENERGAN) 12.5 MG tablet Take 1 tablet (12.5 mg total) by mouth every 6 (six) hours as needed for nausea or vomiting. 01/16/17   GrKaren KitchensNP  traMADol (ULTRAM) 50 MG tablet Take 1 tablet (50 mg total) by mouth every 12 (twelve) hours as needed. Patient not taking: Reported on  01/16/2017 12/27/16   GrKaren KitchensNP     Allergies:     Allergies  Allergen Reactions  . Taxol [Paclitaxel] Anaphylaxis  . Penicillins Swelling    Has patient had a PCN reaction causing immediate rash, facial/tongue/throat swelling, SOB or lightheadedness  with hypotension: No Has patient had a PCN reaction causing severe rash involving mucus membranes or skin necrosis: No Has patient had a PCN reaction that required hospitalization: Yes Has patient had a PCN reaction occurring within the last 10 years: No If all of the above answers are "NO", then may proceed with Cephalosporin use.  **Localized swelling in the arm**   . Aspirin Swelling  . Zofran [Ondansetron Hcl] Nausea And Vomiting     Physical Exam:   Vitals  Blood pressure 111/88, pulse (!) 104, temperature 98.9 F (37.2 C), temperature source Oral, resp. rate 18, weight 60.6 kg (133 lb 9.6 oz), last menstrual period 03/19/2015, SpO2 100 %.   1. General lying in bed in NAD,   2. Normal affect and insight, Not Suicidal or Homicidal, Awake Alert, Oriented X 3.  3. No F.N deficits, ALL C.Nerves Intact, Strength 5/5 all 4 extremities, Sensation intact all 4 extremities, Plantars down going.  4. Ears and Eyes appear Normal, Conjunctivae clear, PERRLA. Moist Oral Mucosa.  5. Supple Neck, No JVD, No cervical lymphadenopathy appriciated, No Carotid Bruits.  6. Symmetrical Chest wall movement, Good air movement bilaterally, CTAB.  7. RRR, No Gallops, Rubs or Murmurs, No Parasternal Heave.  8. Positive Bowel Sounds, Abdomen Soft, No tenderness, No organomegaly appriciated,No rebound -guarding or rigidity.  9.  No Cyanosis, Normal Skin Turgor, No Skin Rash or Bruise.  10. Good muscle tone,  joints appear normal , no effusions, Normal ROM.  11. No Palpable Lymph Nodes in Neck or Axillae     Data Review:    CBC Recent Labs  Lab 02/03/17 1139 02/04/17 1618  WBC 24.1* 23.7*  HGB 11.0* 10.6*  HCT 33.1* 32.5*    PLT 174 151  MCV 93.0 94.7  MCH 30.8 31.0  MCHC 33.1 32.7  RDW 22.9* 23.7*  LYMPHSABS 1.0 0.2*  MONOABS 1.2* 0.5  EOSABS 0.0 0.0  BASOSABS 0.0 0.2*   ------------------------------------------------------------------------------------------------------------------  Chemistries  Recent Labs  Lab 02/03/17 1139 02/04/17 1618  NA 138 138  K 3.0* 3.3*  CL 102 107  CO2 22 20*  GLUCOSE 112* 65  BUN 6 6  CREATININE 0.70 0.69  CALCIUM 7.7* 7.1*  AST 168* 164*  ALT 97* 111*  ALKPHOS 359* 387*  BILITOT 3.8* 6.0*   ------------------------------------------------------------------------------------------------------------------ estimated creatinine clearance is 74.2 mL/min (by C-G formula based on SCr of 0.69 mg/dL). ------------------------------------------------------------------------------------------------------------------ No results for input(s): TSH, T4TOTAL, T3FREE, THYROIDAB in the last 72 hours.  Invalid input(s): FREET3  Coagulation profile No results for input(s): INR, PROTIME in the last 168 hours. ------------------------------------------------------------------------------------------------------------------- No results for input(s): DDIMER in the last 72 hours. -------------------------------------------------------------------------------------------------------------------  Cardiac Enzymes Recent Labs  Lab 02/03/17 1139  TROPONINI 0.05*   ------------------------------------------------------------------------------------------------------------------ No results found for: BNP   ---------------------------------------------------------------------------------------------------------------  Urinalysis    Component Value Date/Time   COLORURINE YELLOW (A) 02/03/2017 1139   APPEARANCEUR CLEAR (A) 02/03/2017 1139   LABSPEC 1.008 02/03/2017 1139   PHURINE 9.0 (H) 02/03/2017 1139   GLUCOSEU NEGATIVE 02/03/2017 1139   HGBUR NEGATIVE 02/03/2017 1139    BILIRUBINUR NEGATIVE 02/03/2017 1139   KETONESUR 20 (A) 02/03/2017 1139   PROTEINUR NEGATIVE 02/03/2017 1139   NITRITE NEGATIVE 02/03/2017 1139   LEUKOCYTESUR NEGATIVE 02/03/2017 1139    ----------------------------------------------------------------------------------------------------------------   Imaging Results:    Mr Abdomen With Mrcp W Contrast  Addendum Date: 02/03/2017   ADDENDUM REPORT: 02/03/2017 18:28 ADDENDUM: As mentioned in the body of the report, there is mild left hydroureteronephrosis of indeterminate  etiology, but new since 12/22/2016. Electronically Signed   By: Misty Stanley M.D.   On: 02/03/2017 18:28   Result Date: 02/03/2017 CLINICAL DATA:  Breast cancer with metastatic disease to the liver. Abdominal pain and fever. Ultrasound earlier today suggested acute cholecystitis. EXAM: MRI ABDOMEN WITHOUT CONTRAST  (INCLUDING MRCP) TECHNIQUE: Multiplanar multisequence MR imaging of the abdomen was performed. Heavily T2-weighted images of the biliary and pancreatic ducts were obtained, and three-dimensional MRCP images were rendered by post processing. COMPARISON:  Ultrasound exam earlier today.  Abdomen CT 12/22/2016 FINDINGS: Lower chest:  Small right pleural effusion. Hepatobiliary: Multiple liver mets are identified with 1 of the more dominant lesions measuring 3.4 cm in the posterior right hepatic lobe. There is an area confluent metastatic disease in the central liver near the hepatic duct confluence measuring 3.9 x 5.4 cm. MRCP images show mild intrahepatic biliary duct dilatation with marked attenuation of the bile duct in the region of the porta hepatis. Common duct cannot be traced through the porta hepatis into the head of the pancreas and common duct inclusion in the hepatoduodenal ligament is also concern, but this is not well evaluated given motion artifact. 7 mm gallstone evident with apparent gallbladder wall thickening and/ or pericholecystic edema Pancreas: Not well  seen due to motion artifact from patient difficulty with reproducible breath holding. No dilatation the main pancreatic duct. Spleen: No splenomegaly. No focal mass lesion. Adrenals/Urinary Tract: No adrenal nodule or mass. Right kidney unremarkable. There is mild left hydroureteronephrosis although full extent of the distended left ureter not visualized. Stomach/Bowel: Stomach is nondistended. No gastric wall thickening. No evidence of outlet obstruction. Duodenum is normally positioned as is the ligament of Treitz. No small bowel or colonic dilatation within the visualized abdomen. Vascular/Lymphatic: No abdominal aortic aneurysm. Probable lymphadenopathy in the porta hepatis. Other: Small volume intraperitoneal free fluid evident. Musculoskeletal: Multiple foci of abnormal marrow enhancement compatible with bony metastatic disease. IMPRESSION: 1. Mild intrahepatic biliary duct distension with marked attenuation of the common duct in the region of the porta hepatis, likely due to metastatic disease in the central liver with metastatic lymphadenopathy in the porta hepatis also a consideration. The distal common bile duct is not well seen as study is fairly motion degraded and an occlusion of the common bile duct in the porta hepatis towards the head of pancreas is also concern. No evidence for choledocholithiasis. 2. Cholelithiasis with edema or fluid in/around the gallbladder wall. As noted on ultrasound exam earlier today, cholecystitis is a concern. 3. Bulky metastatic disease in the liver as seen on recent CT scan. Electronically Signed: By: Misty Stanley M.D. On: 02/03/2017 18:23   US Abdomen Limited Ruq  Result Date: 02/03/2017 CLINICAL DATA:  Elevated LFTs. Breast cancer with known metastatic disease to the liver. EXAM: ULTRASOUND ABDOMEN LIMITED RIGHT UPPER QUADRANT COMPARISON:  None. FINDINGS: Gallbladder: Gallbladder wall thickening measures 4 mm. Shadowing stones are present at the fundus, measuring  up to 1.2 cm. There is sludge. No sonographic Murphy's sign is reported. However, the patient has received pain medication. Common bile duct: Diameter: 5.0 mm, within normal limits. Liver: Multiple heterogeneous hypoechoic lesions are again seen. The lesion posteriorly in the right frontal lobe measures 4.0 x 4.5 x 4.1 cm. More laterally in the right lobe the lesion measures 3.6 x 3.3 x 3.0 cm. The lesion in the left lobe measures 2.4 x 1.8 x 2.6 cm. These lesions are similar to the prior ultrasound. Portal vein is patent on color Doppler  imaging with normal direction of blood flow towards the liver. IMPRESSION: 1. Gallbladder wall thickening and 1.2 cm gallstone suggesting acute cholecystitis. 2. Multiple metastatic lesions again noted within the liver. Electronically Signed   By: San Morelle M.D.   On: 02/03/2017 14:54   Ekg nsr at 80, nl axis, nl pr, slight increased in Qtc 460,  Early R progression   Assessment & Plan:    Principal Problem:   Cholecystitis, acute Active Problems:   Metastatic breast cancer (HCC)   Nausea with vomiting   Tachycardia    Acute cholecystitis Blood cx x2 at [facility] no growth cipro 496m iv bid Flagyl 5035miv tid Surgery consulted (discussed case with Dr. WyHulen Skains appreciate input.  GI consulted on 11/17,  (discussed case with JoScarlette Shorts GI will follow Please contact them in the AM, I think they expected the patient to be here 11/18 am Pt will probably need ERCP with stent versus percutaneous drainage  Defer to GI/ surgery  UTI Await urine culture cipro [id number] bid  Leukocytosis Check cbc in am  Tachycardia ? Due to cholecystitis vs uti vs r/o PE Trop I q6h x3 D dimer (since pt on megace) If positive will order CTA chest r/o PE  Trop elevation Check cardiac echo Cardiology consult via email for AM consult  Hypokalemia Kcl 20 meq po x1 Check cmp in am   Metastatic breast cancer Last chemo 2 weeks prior to  admission Received neulasta as well Please f/u with oncology as outpatient    DVT Prophylaxis   Lovenox - SCDs  AM Labs Ordered, also please review Full Orders  Family Communication: Admission, patients condition and plan of care including tests being ordered have been discussed with the patient  who indicate understanding and agree with the plan and Code Status.  Code Status FULL CODE  Likely DC to  home  Condition GUARDED    Consults called: GI (spoke with JohScarlette Shorts/17pm), , surgery (spoke with JayDoreen SalvageCardiology by email regarding trop elevation likely demand ischemia  Admission status: inpatient  Time spent in minutes : 45    JamJani GravelD on 02/04/2017 at 7:37 PM  Between 7am to 7pm - Pager - [phone] 7pm go to [email or web address] - password [email or web address] - Office  [phone]

## 2017-02-04 NOTE — ED Notes (Signed)
Spoke to [place] regarding nausea and IVF. Per EDP, change IVF to 51mL/hr, may give 10mg  Reglan now if pt wants. Waiting for interpreter at this time.

## 2017-02-05 ENCOUNTER — Encounter (HOSPITAL_COMMUNITY): Payer: Self-pay | Admitting: Cardiovascular Disease

## 2017-02-05 ENCOUNTER — Inpatient Hospital Stay (HOSPITAL_COMMUNITY): Payer: Medicare HMO

## 2017-02-05 ENCOUNTER — Other Ambulatory Visit: Payer: Self-pay

## 2017-02-05 DIAGNOSIS — I3139 Other pericardial effusion (noninflammatory): Secondary | ICD-10-CM

## 2017-02-05 DIAGNOSIS — I313 Pericardial effusion (noninflammatory): Secondary | ICD-10-CM

## 2017-02-05 HISTORY — DX: Other pericardial effusion (noninflammatory): I31.39

## 2017-02-05 HISTORY — DX: Pericardial effusion (noninflammatory): I31.3

## 2017-02-05 LAB — ECHOCARDIOGRAM COMPLETE
HEIGHTINCHES: 63 in
WEIGHTICAEL: 2215.18 [oz_av]

## 2017-02-05 LAB — COMPREHENSIVE METABOLIC PANEL
ALK PHOS: 360 U/L — AB (ref 38–126)
ALT: 94 U/L — ABNORMAL HIGH (ref 14–54)
ANION GAP: 10 (ref 5–15)
AST: 144 U/L — ABNORMAL HIGH (ref 15–41)
Albumin: 2.4 g/dL — ABNORMAL LOW (ref 3.5–5.0)
BILIRUBIN TOTAL: 6.2 mg/dL — AB (ref 0.3–1.2)
BUN: <5 mg/dL — ABNORMAL LOW (ref 6–20)
CALCIUM: 6.5 mg/dL — AB (ref 8.9–10.3)
CO2: 17 mmol/L — ABNORMAL LOW (ref 22–32)
Chloride: 109 mmol/L (ref 101–111)
Creatinine, Ser: 0.83 mg/dL (ref 0.44–1.00)
GFR calc non Af Amer: >60 mL/min (ref >60)
Glucose, Bld: 76 mg/dL (ref 65–99)
Potassium: 3.7 mmol/L (ref 3.5–5.1)
Sodium: 136 mmol/L (ref 135–145)
TOTAL PROTEIN: 5.1 g/dL — AB (ref 6.5–8.1)

## 2017-02-05 LAB — CBC
HEMATOCRIT: 28.1 % — AB (ref 36.0–46.0)
HEMOGLOBIN: 9 g/dL — AB (ref 12.0–15.0)
MCH: 30 pg (ref 26.0–34.0)
MCHC: 32 g/dL (ref 30.0–36.0)
MCV: 93.7 fL (ref 78.0–100.0)
Platelets: 136 10*3/uL — ABNORMAL LOW (ref 150–400)
RBC: 3 MIL/uL — AB (ref 3.87–5.11)
RDW: 22.2 % — ABNORMAL HIGH (ref 11.5–15.5)
WBC: 19.3 10*3/uL — AB (ref 4.0–10.5)

## 2017-02-05 LAB — URINE CULTURE: CULTURE: NO GROWTH

## 2017-02-05 LAB — TSH: TSH: 4.337 u[IU]/mL (ref 0.350–4.500)

## 2017-02-05 LAB — TROPONIN I

## 2017-02-05 LAB — HIV ANTIBODY (ROUTINE TESTING W REFLEX): HIV Screen 4th Generation wRfx: NONREACTIVE

## 2017-02-05 MED ORDER — IOPAMIDOL (ISOVUE-370) INJECTION 76%
100.0000 mL | Freq: Once | INTRAVENOUS | Status: AC | PRN
  Administered 2017-02-05: 50 mL via INTRAVENOUS

## 2017-02-05 MED ORDER — CHLORHEXIDINE GLUCONATE CLOTH 2 % EX PADS: 6.0000 | MEDICATED_PAD | Freq: Every day | CUTANEOUS | Status: DC

## 2017-02-05 MED ORDER — SODIUM CHLORIDE 0.9 % IV SOLN: INTRAVENOUS | Status: DC

## 2017-02-05 MED ORDER — ACETAMINOPHEN 500 MG PO TABS
500.0000 mg | ORAL_TABLET | Freq: Four times a day (QID) | ORAL | Status: DC | PRN
  Administered 2017-02-07: 500 mg via ORAL
  Filled 2017-02-05 (×2): qty 1

## 2017-02-05 MED ORDER — ORAL CARE MOUTH RINSE
15.0000 mL | Freq: Two times a day (BID) | OROMUCOSAL | Status: DC
  Administered 2017-02-06 – 2017-02-10 (×6): 15 mL via OROMUCOSAL

## 2017-02-05 MED ORDER — CHLORHEXIDINE GLUCONATE 0.12 % MT SOLN
15.0000 mL | Freq: Two times a day (BID) | OROMUCOSAL | Status: DC
  Administered 2017-02-05 – 2017-02-10 (×10): 15 mL via OROMUCOSAL
  Filled 2017-02-05 (×10): qty 15

## 2017-02-05 MED ORDER — SODIUM CHLORIDE 0.9% FLUSH: 10.0000 mL | INTRAVENOUS | Status: DC | PRN

## 2017-02-05 NOTE — Progress Notes (Signed)
  Echocardiogram 2D Echocardiogram has been performed.  Melinda Tanner 02/05/2017, 12:58 PM

## 2017-02-05 NOTE — Progress Notes (Addendum)
[place] TEAM 1 - Stepdown/ICU TEAM  Melinda Tanner  GMW:[id number] DOB: May 28, 1970 DOA: 02/04/2017 PCP: Theotis Burrow, MD    Brief Narrative:  46 y.o. female w/ metastatic breast cancer to liver brain and bone, and a seizure d/o who presented to [facility] ED w/ c/o persisting nausea and vomiting.  In the ED a RUQ noted > gallbladder wall thickening and 1.2 cm gallstone suggesting acute cholecystitis, w/ multiple metastatic lesions within the liver.  MRCP noted mild intrahepatic biliary duct distension with marked attenuation of the common duct in the region of the porta hepatis, likely due to metastatic disease in the central liver with metastatic lymphadenopathy in the porta hepatis also a consideration. The distal common bile duct was not well seen and an occlusion of the common bile duct in the porta hepatis towards the head of pancreas was also a concern.  The pt was transferred to [facility] for evaluations by GI and surgery.    Significant Events: 11/18 presented to [facility] ED - transferred to [facility]   Subjective: Feels her vomiting has ceased.  Her nausea persists, but is less severe.  She denies abdomp pain or cp.    Assessment & Plan:  Acute cholecystitis Gen Surgery following - I suspect perc drainage will be the best approach   Obstructive jaundice  Likely due to metastatic breast CA - GI does not feel an ERCP will benefit her and has suggested percutaneous drainage of the liver as well as GB, w/ 2 possible drains needing to be placed - will ask IR to see in consultation to review the case and discuss options w/ the pt  Trop elevation Extremely minimal elevation - no sx - no need for further inpt evaluation   Hypokalemia Corrected   Metastatic breast cancer   DVT prophylaxis: lovenox Code Status: FULL CODE Family Communication: spoke w/ many family members at bedside  Disposition Plan: SDU  Consultants:  Gen Surgery  GI IR  Antimicrobials:    Cipro 11/17 > Flagyl 11/17 >   Objective: Blood pressure 107/81, pulse (!) 102, temperature 98.3 F (36.8 C), temperature source Oral, resp. rate (!) 23, height 5\' 3"  (1.6 m), weight 62.8 kg (138 lb 7.2 oz), last menstrual period 03/19/2015, SpO2 100 %.  Intake/Output Summary (Last 24 hours) at 02/05/2017 1426 Last data filed at 02/05/2017 1045 Gross per 24 hour  Intake 1500 ml  Output 200 ml  Net 1300 ml   Filed Weights   02/04/17 1927 02/05/17 0500  Weight: 60.6 kg (133 lb 9.6 oz) 62.8 kg (138 lb 7.2 oz)    Examination: General: No acute respiratory distress - sclera icteric Lungs: Clear to auscultation bilaterally without wheezes or crackles Cardiovascular: Regular rate and rhythm without murmur gallop or rub normal S1 and S2 Abdomen: mildly tender in RUQ - no rebound - no appreciable mass Extremities: No significant cyanosis, clubbing, or edema bilateral lower extremities  CBC: Recent Labs  Lab 02/03/17 1139 02/04/17 1618 02/05/17 0600  WBC 24.1* 23.7* 19.3*  NEUTROABS 21.9* 22.8*  --   HGB 11.0* 10.6* 9.0*  HCT 33.1* 32.5* 28.1*  MCV 93.0 94.7 93.7  PLT 174 151 440*   Basic Metabolic Panel: Recent Labs  Lab 02/03/17 1139 02/04/17 1618 02/04/17 2250 02/05/17 0600  NA 138 138  --  136  K 3.0* 3.3*  --  3.7  CL 102 107  --  109  CO2 22 20*  --  17*  GLUCOSE 112* 65  --  76  BUN 6 6  --  <5*  CREATININE 0.70 0.69  --  0.83  CALCIUM 7.7* 7.1*  --  6.5*  MG  --   --  1.6*  --    GFR: Estimated Creatinine Clearance: 71.6 mL/min (by C-G formula based on SCr of 0.83 mg/dL).  Liver Function Tests: Recent Labs  Lab 02/03/17 1139 02/04/17 1618 02/05/17 0600  AST 168* 164* 144*  ALT 97* 111* 94*  ALKPHOS 359* 387* 360*  BILITOT 3.8* 6.0* 6.2*  PROT 6.6 6.2* 5.1*  ALBUMIN 3.3* 2.9* 2.4*   No results for input(s): LIPASE, AMYLASE in the last 168 hours. No results for input(s): AMMONIA in the last 168 hours.  Coagulation Profile: No results for  input(s): INR, PROTIME in the last 168 hours.  Cardiac Enzymes: Recent Labs  Lab 02/03/17 1139 02/04/17 2250 02/05/17 0600  TROPONINI 0.05* <0.03 <0.03    Recent Results (from the past 240 hour(s))  Urine culture     Status: None   Collection Time: 02/03/17 11:39 AM  Result Value Ref Range Status   Specimen Description URINE, RANDOM  Final   Special Requests NONE  Final   Culture   Final    NO GROWTH Performed at [facility], [place] [address], [place], [place] [address]    Report Status 02/05/2017 FINAL  Final  Culture, blood (routine x 2)     Status: None (Preliminary result)   Collection Time: 02/03/17 12:45 PM  Result Value Ref Range Status   Specimen Description BLOOD BLOOD RIGHT WRIST  Final   Special Requests   Final    BOTTLES DRAWN AEROBIC AND ANAEROBIC Blood Culture results may not be optimal due to an inadequate volume of blood received in culture bottles   Culture NO GROWTH 2 DAYS  Final   Report Status PENDING  Incomplete  Culture, blood (routine x 2)     Status: None (Preliminary result)   Collection Time: 02/03/17  3:48 PM  Result Value Ref Range Status   Specimen Description BLOOD BLOOD RIGHT WRIST  Final   Special Requests   Final    BOTTLES DRAWN AEROBIC AND ANAEROBIC Blood Culture adequate volume   Culture NO GROWTH 2 DAYS  Final   Report Status PENDING  Incomplete  MRSA PCR Screening     Status: None   Collection Time: 02/04/17  7:34 PM  Result Value Ref Range Status   MRSA by PCR NEGATIVE NEGATIVE Final    Comment:        The GeneXpert MRSA Assay (FDA approved for NASAL specimens only), is one component of a comprehensive MRSA colonization surveillance program. It is not intended to diagnose MRSA infection nor to guide or monitor treatment for MRSA infections.      Scheduled Meds: . chlorhexidine  15 mL Mouth Rinse BID  . Chlorhexidine Gluconate Cloth  6 each Topical Daily  . enoxaparin (LOVENOX) injection  40 mg Subcutaneous Q24H  .  mouth rinse  15 mL Mouth Rinse q12n4p  . pantoprazole  40 mg Oral Daily     LOS: 1 day   Cherene Altes, MD [facility] Office  [phone] Pager - Text Page per Amion as per below:  On-Call/Text Page:      [email or web address]      password [email or web address]  If 7PM-7AM, please contact night-coverage [email or web address] Password [email or web address] 02/05/2017, 2:26 PM

## 2017-02-05 NOTE — Progress Notes (Signed)
Patient has elevated D-Dimer, CT angio ordered to rule out PE. Patient has a port-a-cath of unknown origin from home. The radiology techs and IV team are unsure of the type of port-a-cath and if it is suitable for a CT angio. IV team was able to visualize a deep basilic vein that could be used for a midline. Maudie Mercury, MD wanted to wait for a second opinion to determine the origin of the port-a-cath and if it can be used for a CT angio. If the port-a-cath is determined unsuitable for a CT angio, then midline can be placed to complete the test.

## 2017-02-05 NOTE — Progress Notes (Signed)
Explained to Dr. Maudie Mercury that from the cxr, I am unable to verify this port is suitable for ct angio.  Visuals with u/s to upper arms.  Nothing suitable visualized right side.  Left side has basilic, too deep for 44RX5.40 inch.  Suggested midline.

## 2017-02-05 NOTE — Consult Note (Signed)
[facility] Consult  Referring Provider:Dr.McClung([facility]) Primary Care Physician:  Theotis Burrow, MD Primary Gastroenterologist: Althia Forts  Reason for Consultation:  Obstructive jaundice  HPI: Melinda Tanner is a 46 y.o. female (Spanish-speaking , used video for interpretation and translation )with metastatic breast cancer to liver, brain, bone and seizure was admitted on 01/04/17 with complaints of nausea, vomiting and epigastric along with right upper quadrant abdominal pain. Ultrasounded abdomen at [facility] emergency showed gallbladder wall thickening and gallstones suggestive of acute cholecystitis long with multiple metastatic lesions within the liver. MRCP showed mild intrahepatic biliary distention, especially in the region of the porta hepatis with multiple metastatic lymphadenopathy, cholelithiasis, bulky metastatic disease.  Patient reports having breast cancer for the last 9-1/2 years. This has been treated with mastectomy, chemotherapy and radiation, brain surgery in 2012 and 2016.  Patient states she has noted change in the color of stool, urine as well as a skin. She has lost 20 pounds in the last 2 months. For the last 2 days she had multiple episodes of intractable nausea and vomiting, which has resolved however she is afraid of eating because of worsening right upper quadrant abdominal pain.    Past Medical History:  Diagnosis Date  . Brain cancer ([place]) 2012   Met. from Breast  . Breast cancer ([place]) 2009  . Complication of anesthesia    nausea, "drops in potassium and magnesium"  . Pericardial effusion 02/05/2017  . Seizures ([place])     Past Surgical History:  Procedure Laterality Date  . BRAIN SURGERY  2012, 2106  . CESAREAN SECTION    . LAPAROSCOPIC BILATERAL SALPINGO OOPHORECTOMY Bilateral 05/18/2015   Performed by Will Bonnet, MD at [facility] ORS  . MASTECTOMY Bilateral 2010    Prior to Admission medications    Medication Sig Start Date End Date Taking? Authorizing Provider  CALCIUM-VITAMIN D PO Take 1 tablet by mouth daily.   Yes [provider]  Cyanocobalamin (VITAMIN B-12 PO) Take by mouth.   Yes [provider]  ibuprofen (ADVIL,MOTRIN) 800 MG tablet Take 800 mg by mouth every 8 (eight) hours as needed (Takes 1/2 tablet prn).   Yes [provider]  LORazepam (ATIVAN) 0.5 MG tablet 1/2-1 tab (0.70m to 0.583m every 6 hours PRN nausea 12/27/16  Yes GrKaren KitchensNP  megestrol (MEGACE) 400 MG/10ML suspension Take 5 mLs (200 mg total) by mouth daily. 01/16/17  Yes GrKaren KitchensNP  Multiple Vitamins-Minerals (CENTRUM ADULTS PO) Take 1 tablet by mouth daily.   Yes [provider]  omeprazole (PRILOSEC) 20 MG capsule Take 20 mg by mouth daily. 09/07/15  Yes [provider]  pantoprazole (PROTONIX) 20 MG tablet Take 1 tablet by mouth daily 09/29/16  Yes [place], MeDrue SecondMD  promethazine (PHENERGAN) 12.5 MG tablet Take 1 tablet (12.5 mg total) by mouth every 6 (six) hours as needed for nausea or vomiting. 01/16/17  Yes GrKaren KitchensNP  dexamethasone (DECADRON) 4 MG tablet Take 1 tablet (4 mg total) by mouth daily. Patient not taking: Reported on 02/03/2017 11/02/16   ChNoreene FilbertMD  docusate sodium (COLACE) 100 MG capsule Take 1 tablet once or twice daily as needed for constipation while taking narcotic pain medicine Patient not taking: Reported on 12/25/2016 06/07/16   FoHinda KehrMD  potassium chloride SA (K-DUR,KLOR-CON) 20 MEQ tablet Take 1 tablet (20 mEq total) by mouth daily. Patient not taking: Reported on 01/16/2017 01/02/17   GrKaren KitchensNP  traMADol (ULTRAM) 50 MG tablet Take 1 tablet (50 mg total) by mouth every 12 (twelve) hours as needed. Patient not taking: Reported on 01/16/2017 12/27/16   Karen Kitchens, NP    Current Facility-Administered Medications  Medication Dose Route Frequency Provider Last Rate Last Dose  . 0.9 % NaCl  with KCl 20 mEq/ L  infusion   Intravenous Continuous Jani Gravel, MD   Stopped at 02/05/17 1026  . acetaminophen (TYLENOL) tablet 650 mg  650 mg Oral Q6H PRN Jani Gravel, MD       Or  . acetaminophen (TYLENOL) suppository 650 mg  650 mg Rectal Q6H PRN Jani Gravel, MD      . chlorhexidine (PERIDEX) 0.12 % solution 15 mL  15 mL Mouth Rinse BID Jani Gravel, MD      . Chlorhexidine Gluconate Cloth 2 % PADS 6 each  6 each Topical Daily Cherene Altes, MD      . ciprofloxacin (CIPRO) IVPB 400 mg  400 mg Intravenous Q12H Jani Gravel, MD   Stopped at 02/05/17 1027  . enoxaparin (LOVENOX) injection 40 mg  40 mg Subcutaneous Q24H Jani Gravel, MD   40 mg at 02/04/17 2229  . MEDLINE mouth rinse  15 mL Mouth Rinse q12n4p Jani Gravel, MD      . metroNIDAZOLE (FLAGYL) IVPB 500 mg  500 mg Intravenous Lysle Dingwall, MD   Stopped at 02/05/17 1027  . pantoprazole (PROTONIX) EC tablet 40 mg  40 mg Oral Daily Jani Gravel, MD   40 mg at 02/04/17 2242  . prochlorperazine (COMPAZINE) injection 10 mg  10 mg Intravenous Q6H PRN Jani Gravel, MD      . simethicone [facility]) chewable tablet 80 mg  80 mg Oral Q4H PRN Jani Gravel, MD      . sodium chloride flush (NS) 0.9 % injection 10-40 mL  10-40 mL Intracatheter PRN Cherene Altes, MD        Allergies as of 02/03/2017 - Review Complete 02/03/2017  Allergen Reaction Noted  . Taxol [paclitaxel] Anaphylaxis 04/16/2015  . Penicillins Swelling 04/16/2015  . Aspirin Swelling 04/16/2015  . Zofran [ondansetron hcl] Nausea And Vomiting 04/16/2015    Family History  Problem Relation Age of Onset  . Cancer Paternal Aunt   . Cancer Paternal Uncle   . Cancer Paternal Grandfather   . Hypertension Brother   . Diabetes Paternal Grandmother   . CAD Father     Social History   Socioeconomic History  . Marital status: Married    Spouse name: Not on file  . Number of children: Not on file  . Years of education: Not on file  . Highest education level: Not on file  Social  Needs  . Financial resource strain: Not on file  . Food insecurity - worry: Not on file  . Food insecurity - inability: Not on file  . Transportation needs - medical: Not on file  . Transportation needs - non-medical: Not on file  Occupational History  . Not on file  Tobacco Use  . Smoking status: Never Smoker  . Smokeless tobacco: Never Used  Substance and Sexual Activity  . Alcohol use: No  . Drug use: No  . Sexual activity: Not on file  Other Topics Concern  . Not on file  Social History Narrative  . Not on file    Review of Systems: Positive for: GI: Described in detail in HPI.    Gen: fever, chills, rigors, night sweats, anorexia, fatigue, weakness,  malaise, involuntary weight loss,Denies any  and sleep disorder CV: Denies chest pain, angina, palpitations, syncope, orthopnea, PND, peripheral edema, and claudication. Resp: Denies dyspnea, cough, sputum, wheezing, coughing up blood. GU : Denies urinary burning, blood in urine, urinary frequency, urinary hesitancy, nocturnal urination, and urinary incontinence. MS: Denies joint pain or swelling.  Denies muscle weakness, cramps, atrophy.  Derm: Denies rash, itching, oral ulcerations, hives, unhealing ulcers.  Psych: Denies depression, anxiety, memory loss, suicidal ideation, hallucinations,  and confusion. Heme: Denies bruising, bleeding, and enlarged lymph nodes. Neuro:  Denies any headaches, dizziness, paresthesias. Endo:  Denies any problems with DM, thyroid, adrenal function.  Physical Exam: Vital signs in last 24 hours: Temp:  [98.1 F (36.7 C)-98.9 F (37.2 C)] 98.1 F (36.7 C) (11/19 1531) Pulse Rate:  [85-111] 108 (11/19 1531) Resp:  [18-28] 28 (11/19 1531) BP: (94-113)/(73-88) 108/83 (11/19 1531) SpO2:  [99 %-100 %] 100 % (11/19 1531) Weight:  [60.6 kg (133 lb 9.6 oz)-62.8 kg (138 lb 7.2 oz)] 62.8 kg (138 lb 7.2 oz) (11/19 0500)    General:   Alert,  Well-developed, pleasant and cooperative in NAD, appears  weak Head:  Normocephalic and atraumatic. Eyes:  Mild icterus.   Mild pallor Ears:  Normal auditory acuity. Nose:  No deformity, discharge,  or lesions. Mouth:  No deformity or lesions.  Oropharynx pink & moist. Neck:  Supple; no masses or thyromegaly. Lungs:  Clear throughout to auscultation.   No wheezes, crackles, or rhonchi. No acute distress. Heart:  Regular rate and rhythm; tachycardia, no murmurs, clicks, rubs,  or gallops. Extremities:  Without clubbing or edema. Neurologic:  Alert and  oriented x4;  grossly normal neurologically. Skin:  Intact without significant lesions or rashes. Psych:  Alert and cooperative. Normal mood and affect. Abdomen:  Soft, tenderness appreciated in epigastric and right upper quadrant and nondistended. No masses, hepatosplenomegaly or hernias noted. Normal bowel sounds and without rebound.   Voluntary guarding in right upper quadrant     Lab Results: Recent Labs    02/03/17 1139 02/04/17 1618 02/05/17 0600  WBC 24.1* 23.7* 19.3*  HGB 11.0* 10.6* 9.0*  HCT 33.1* 32.5* 28.1*  PLT 174 151 136*   BMET Recent Labs    02/03/17 1139 02/04/17 1618 02/05/17 0600  NA 138 138 136  K 3.0* 3.3* 3.7  CL 102 107 109  CO2 22 20* 17*  GLUCOSE 112* 65 76  BUN 6 6 <5*  CREATININE 0.70 0.69 0.83  CALCIUM 7.7* 7.1* 6.5*   LFT Recent Labs    02/05/17 0600  PROT 5.1*  ALBUMIN 2.4*  AST 144*  ALT 94*  ALKPHOS 360*  BILITOT 6.2*   PT/INR No results for input(s): LABPROT, INR in the last 72 hours.  Studies/Results: Ct Angio Chest Pe W Or Wo Contrast  Result Date: 02/05/2017 CLINICAL DATA:  Metastatic breast cancer. Positive D-dimer. Shortness of breath. EXAM: CT ANGIOGRAPHY CHEST WITH CONTRAST TECHNIQUE: Multidetector CT imaging of the chest was performed using the standard protocol during bolus administration of intravenous contrast. Multiplanar CT image reconstructions and MIPs were obtained to evaluate the vascular anatomy. CONTRAST:  54m  ISOVUE-370 IOPAMIDOL (ISOVUE-370) INJECTION 76% COMPARISON:  11/29/2016 FINDINGS: Cardiovascular: No filling defects in the pulmonary arteries to suggest pulmonary emboli. Small pericardial effusion, similar to prior study. Aorta is normal caliber. Mediastinum/Nodes: Small scattered mediastinal lymph nodes, similar prior study. No hilar or axillary adenopathy. Lungs/Pleura: Large right pleural effusion with compressive atelectasis in the right lower lobe. Biapical scarring. Nodular  densities in the right upper lobe are again noted, stable. Linear subsegmental atelectasis at the left base. Upper Abdomen: Extensive metastatic disease seen throughout the liver, better seen on prior abdominal CT. Small stable nodule in the left adrenal gland. Musculoskeletal: Bilateral breast implants noted. Medial left Port-A-Cath scratched at medial left chest wall Port-A-Cath noted, unchanged. Extensive bony metastatic disease again noted throughout the thoracic spine and lower cervical spine as well as multiple bilateral ribs, unchanged. Review of the MIP images confirms the above findings. IMPRESSION: No evidence of pulmonary embolus. Large right pleural effusion, new since prior study. Compressive atelectasis in the right lower lobe. Trace left pleural effusion. Stable small pericardial effusion. Stable borderline sized mediastinal lymph nodes. Nodular areas in the right upper lobe/ apex are stable. Biapical scarring. Extensive metastatic disease throughout the liver and bones, unchanged. Stable left adrenal nodule. Electronically Signed   By: Rolm Baptise M.D.   On: 02/05/2017 11:39   Mr Abdomen With Mrcp W Contrast  Addendum Date: 02/03/2017   ADDENDUM REPORT: 02/03/2017 18:28 ADDENDUM: As mentioned in the body of the report, there is mild left hydroureteronephrosis of indeterminate etiology, but new since 12/22/2016. Electronically Signed   By: Misty Stanley M.D.   On: 02/03/2017 18:28   Result Date: 02/03/2017 CLINICAL  DATA:  Breast cancer with metastatic disease to the liver. Abdominal pain and fever. Ultrasound earlier today suggested acute cholecystitis. EXAM: MRI ABDOMEN WITHOUT CONTRAST  (INCLUDING MRCP) TECHNIQUE: Multiplanar multisequence MR imaging of the abdomen was performed. Heavily T2-weighted images of the biliary and pancreatic ducts were obtained, and three-dimensional MRCP images were rendered by post processing. COMPARISON:  Ultrasound exam earlier today.  Abdomen CT 12/22/2016 FINDINGS: Lower chest:  Small right pleural effusion. Hepatobiliary: Multiple liver mets are identified with 1 of the more dominant lesions measuring 3.4 cm in the posterior right hepatic lobe. There is an area confluent metastatic disease in the central liver near the hepatic duct confluence measuring 3.9 x 5.4 cm. MRCP images show mild intrahepatic biliary duct dilatation with marked attenuation of the bile duct in the region of the porta hepatis. Common duct cannot be traced through the porta hepatis into the head of the pancreas and common duct inclusion in the hepatoduodenal ligament is also concern, but this is not well evaluated given motion artifact. 7 mm gallstone evident with apparent gallbladder wall thickening and/ or pericholecystic edema Pancreas: Not well seen due to motion artifact from patient difficulty with reproducible breath holding. No dilatation the main pancreatic duct. Spleen: No splenomegaly. No focal mass lesion. Adrenals/Urinary Tract: No adrenal nodule or mass. Right kidney unremarkable. There is mild left hydroureteronephrosis although full extent of the distended left ureter not visualized. Stomach/Bowel: Stomach is nondistended. No gastric wall thickening. No evidence of outlet obstruction. Duodenum is normally positioned as is the ligament of Treitz. No small bowel or colonic dilatation within the visualized abdomen. Vascular/Lymphatic: No abdominal aortic aneurysm. Probable lymphadenopathy in the porta  hepatis. Other: Small volume intraperitoneal free fluid evident. Musculoskeletal: Multiple foci of abnormal marrow enhancement compatible with bony metastatic disease. IMPRESSION: 1. Mild intrahepatic biliary duct distension with marked attenuation of the common duct in the region of the porta hepatis, likely due to metastatic disease in the central liver with metastatic lymphadenopathy in the porta hepatis also a consideration. The distal common bile duct is not well seen as study is fairly motion degraded and an occlusion of the common bile duct in the porta hepatis towards the head of pancreas is  also concern. No evidence for choledocholithiasis. 2. Cholelithiasis with edema or fluid in/around the gallbladder wall. As noted on ultrasound exam earlier today, cholecystitis is a concern. 3. Bulky metastatic disease in the liver as seen on recent CT scan. Electronically Signed: By: Misty Stanley M.D. On: 02/03/2017 18:23    Impression: 1. Metastatic breast cancer-extensive metastases to liver/bones/brain 2. Cholelithiasis, gallbladder wall thickening, compatible with cholecystitis 3. Leukocytosis, normocytic anemia, cholestatic jaundice  Plan: 1. Discussed with patient's hospitalist Dr. Kevan Ny to extensive liver metastases, ERCP with stent placement may not be of much help. Patient will benefit from percutaneous drainage, as bulky metastatic lymphadenopathy noted along with central obstruction in region of porta hepatis. 2. As per surgical evaluation cholecystectomy is relatively contraindicated, and patient may benefit from percutaneous cholecystostomy. Recommend IR consultation. Currently nothing by mouth, on IV Flagyl and IV Cipro, along with Compazine for nausea.   LOS: 1 day   Ronnette Juniper, M.D.  02/05/2017, 3:43 PM  Pager [phone] If no answer or after 5 PM call [phone]

## 2017-02-05 NOTE — Consult Note (Signed)
Cardiology Consultation:   Patient ID: Melinda Tanner; [id number]; 1970-10-02   Admit date: 02/04/2017 Date of Consult: 02/05/2017  Primary Care Provider: Theotis Burrow, MD Primary Cardiologist: new - Dr. Oval Linsey Primary Electrophysiologist:     Patient Profile:   Melinda Tanner is a 46 y.o. 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 Thereasa Solo.  History of Present Illness:   Melinda Tanner was admitted to the hospital with abdominal complaints including left abdomen/flank pain. She was found to have biliary duct distension and acute cholecystitis and was transferred to [facility] from [facility] for ERCP.  She is scheduled for GI consult for ERCP today.  For an unknown reason, troponin was drawn and was mildly positive at 0.05 on 02/03/17. Two enzymes since that time have been negative. Primary team ordered an echocardiogram that is still pending.   A spanish interpreter service was used for this interview.   On my interview, she denies chest pain, SOB, and syncope. She does not follow with a cardiologist now. Her father had a heart attack in his 47s, but no other heart disease in the family. She is a nonsmoker and does not have HLD or HTN. She continues to have upper right abdominal pain and has not had a BM in 2 days.    Past Medical History:  Diagnosis Date  . Brain cancer ([place]) 2012   Met. from Breast  . Breast cancer ([place]) 2009  . Complication of anesthesia    nausea, "drops in potassium and magnesium"  . Seizures ([place])     Past Surgical History:  Procedure Laterality Date  . BRAIN SURGERY  2012, 2106  . CESAREAN SECTION    . LAPAROSCOPIC BILATERAL SALPINGO OOPHORECTOMY Bilateral 05/18/2015   Performed by Will Bonnet, MD at [facility] ORS  . MASTECTOMY Bilateral 2010      Home Medications:  Prior to Admission medications   Medication Sig Start Date End Date Taking? Authorizing Provider  CALCIUM-VITAMIN D PO Take 1 tablet by mouth daily.   Yes [provider]  Cyanocobalamin (VITAMIN B-12 PO) Take by mouth.   Yes [provider]  ibuprofen (ADVIL,MOTRIN) 800 MG tablet Take 800 mg by mouth every 8 (eight) hours as needed (Takes 1/2 tablet prn).   Yes [provider]  LORazepam (ATIVAN) 0.5 MG tablet 1/2-1 tab (0.34m to 0.529m every 6 hours PRN nausea 12/27/16  Yes GrKaren KitchensNP  megestrol (MEGACE) 400 MG/10ML suspension Take 5 mLs (200 mg total) by mouth daily. 01/16/17  Yes GrKaren KitchensNP  Multiple Vitamins-Minerals (CENTRUM ADULTS PO) Take 1 tablet by mouth daily.   Yes [provider]  omeprazole (PRILOSEC) 20 MG capsule Take 20 mg by mouth daily. 09/07/15  Yes [provider]  pantoprazole (PROTONIX) 20 MG tablet Take 1 tablet by mouth daily 09/29/16  Yes Corcoran, MeDrue SecondMD  promethazine (PHENERGAN) 12.5 MG tablet Take 1 tablet (12.5 mg total) by mouth every 6 (six) hours as needed for nausea or vomiting. 01/16/17  Yes GrKaren KitchensNP  dexamethasone (DECADRON) 4 MG tablet Take 1 tablet (4 mg total) by mouth daily. Patient not taking: Reported on 02/03/2017 11/02/16   ChNoreene FilbertMD  docusate sodium (COLACE) 100 MG capsule Take 1 tablet once or twice daily as  needed for constipation while taking narcotic pain medicine Patient not taking: Reported on 12/25/2016 06/07/16   Hinda Kehr, MD  potassium chloride SA (K-DUR,KLOR-CON) 20 MEQ tablet Take 1 tablet (20 mEq total) by mouth daily. Patient not taking: Reported on 01/16/2017 01/02/17   Karen Kitchens, NP  traMADol (ULTRAM) 50 MG tablet Take 1 tablet (50 mg total) by mouth every 12 (twelve) hours as needed. Patient not taking: Reported on 01/16/2017 12/27/16   Karen Kitchens, NP    Inpatient Medications: Scheduled Meds: . chlorhexidine  15 mL  Mouth Rinse BID  . enoxaparin (LOVENOX) injection  40 mg Subcutaneous Q24H  . mouth rinse  15 mL Mouth Rinse q12n4p  . pantoprazole  40 mg Oral Daily   Continuous Infusions: . 0.9 % NaCl with KCl 20 mEq / L    . ciprofloxacin Stopped (02/05/17 0101)  . metronidazole Stopped (02/05/17 0200)   PRN Meds: acetaminophen **OR** acetaminophen, prochlorperazine, simethicone  Allergies:    Allergies  Allergen Reactions  . Taxol [Paclitaxel] Anaphylaxis  . Penicillins Swelling    Has patient had a PCN reaction causing immediate rash, facial/tongue/throat swelling, SOB or lightheadedness with hypotension: No Has patient had a PCN reaction causing severe rash involving mucus membranes or skin necrosis: No Has patient had a PCN reaction that required hospitalization: Yes Has patient had a PCN reaction occurring within the last 10 years: No If all of the above answers are "NO", then may proceed with Cephalosporin use.  **Localized swelling in the arm**   . Aspirin Swelling  . Zofran [Ondansetron Hcl] Nausea And Vomiting    Social History:   Social History   Socioeconomic History  . Marital status: Married    Spouse name: Not on file  . Number of children: Not on file  . Years of education: Not on file  . Highest education level: Not on file  Social Needs  . Financial resource strain: Not on file  . Food insecurity - worry: Not on file  . Food insecurity - inability: Not on file  . Transportation needs - medical: Not on file  . Transportation needs - non-medical: Not on file  Occupational History  . Not on file  Tobacco Use  . Smoking status: Never Smoker  . Smokeless tobacco: Never Used  Substance and Sexual Activity  . Alcohol use: No  . Drug use: No  . Sexual activity: Not on file  Other Topics Concern  . Not on file  Social History Narrative  . Not on file    Family History:    Family History  Problem Relation Age of Onset  . Cancer Paternal Aunt   . Cancer  Paternal Uncle   . Cancer Paternal Grandfather   . Hypertension Brother   . Diabetes Paternal Grandmother   . CAD Father      ROS:  Please see the history of present illness.  ROS  All other ROS reviewed and negative.     Physical Exam/Data:   Vitals:   02/05/17 0100 02/05/17 0334 02/05/17 0500 02/05/17 0700  BP: 104/73 94/80  105/81  Pulse: (!) 102 86  100  Resp: (!) 25 (!) 22  (!) 25  Temp:  98.1 F (36.7 C)    TempSrc:  Oral    SpO2: 99% 99%  100%  Weight:   138 lb 7.2 oz (62.8 kg)   Height:        Intake/Output Summary (Last 24 hours) at 02/05/2017 0830 Last data filed  at 02/05/2017 0600 Gross per 24 hour  Intake 1500 ml  Output -  Net 1500 ml   Filed Weights   02/04/17 1927 02/05/17 0500  Weight: 133 lb 9.6 oz (60.6 kg) 138 lb 7.2 oz (62.8 kg)   Body mass index is 24.53 kg/m.  General:  Well nourished, well developed, in no acute distress HEENT: normal Neck: no JVD Vascular: No carotid bruits Cardiac:  normal S1, S2; regular rhythm, tachycardic rate; no murmur Lungs:  clear to auscultation bilaterally, no wheezing, rhonchi or rales  Abd: soft, nontender, no hepatomegaly  Ext: no edema Musculoskeletal:  No deformities, BUE and BLE strength normal and equal Skin: warm and dry  Neuro:  CNs 2-12 intact, no focal abnormalities noted Psych:  Normal affect   EKG:  The EKG was personally reviewed and demonstrates:  sinus Telemetry:  Telemetry was personally reviewed and demonstrates:  Sinus - sinus tachcyardia  Relevant CV Studies:   Echo pending  Laboratory Data:  Chemistry Recent Labs  Lab 02/03/17 1139 02/04/17 1618 02/05/17 0600  NA 138 138 136  K 3.0* 3.3* 3.7  CL 102 107 109  CO2 22 20* 17*  GLUCOSE 112* 65 76  BUN 6 6 <5*  CREATININE 0.70 0.69 0.83  CALCIUM 7.7* 7.1* 6.5*  GFRNONAA >60 >60 >60  GFRAA >60 >60 >60  ANIONGAP 14 11 10     Recent Labs  Lab 02/03/17 1139 02/04/17 1618 02/05/17 0600  PROT 6.6 6.2* 5.1*  ALBUMIN 3.3*  2.9* 2.4*  AST 168* 164* 144*  ALT 97* 111* 94*  ALKPHOS 359* 387* 360*  BILITOT 3.8* 6.0* 6.2*   Hematology Recent Labs  Lab 02/03/17 1139 02/04/17 1618 02/05/17 0600  WBC 24.1* 23.7* 19.3*  RBC 3.56* 3.43* 3.00*  HGB 11.0* 10.6* 9.0*  HCT 33.1* 32.5* 28.1*  MCV 93.0 94.7 93.7  MCH 30.8 31.0 30.0  MCHC 33.1 32.7 32.0  RDW 22.9* 23.7* 22.2*  PLT 174 151 136*   Cardiac Enzymes Recent Labs  Lab 02/03/17 1139 02/04/17 2250 02/05/17 0600  TROPONINI 0.05* <0.03 <0.03   No results for input(s): TROPIPOC in the last 168 hours.  BNPNo results for input(s): BNP, PROBNP in the last 168 hours.  DDimer  Recent Labs  Lab 02/04/17 2250  DDIMER 1.86*    Radiology/Studies:  Mr Abdomen With Mrcp W Contrast  Addendum Date: 02/03/2017   ADDENDUM REPORT: 02/03/2017 18:28 ADDENDUM: As mentioned in the body of the report, there is mild left hydroureteronephrosis of indeterminate etiology, but new since 12/22/2016. Electronically Signed   By: Misty Stanley M.D.   On: 02/03/2017 18:28   Result Date: 02/03/2017 CLINICAL DATA:  Breast cancer with metastatic disease to the liver. Abdominal pain and fever. Ultrasound earlier today suggested acute cholecystitis. EXAM: MRI ABDOMEN WITHOUT CONTRAST  (INCLUDING MRCP) TECHNIQUE: Multiplanar multisequence MR imaging of the abdomen was performed. Heavily T2-weighted images of the biliary and pancreatic ducts were obtained, and three-dimensional MRCP images were rendered by post processing. COMPARISON:  Ultrasound exam earlier today.  Abdomen CT 12/22/2016 FINDINGS: Lower chest:  Small right pleural effusion. Hepatobiliary: Multiple liver mets are identified with 1 of the more dominant lesions measuring 3.4 cm in the posterior right hepatic lobe. There is an area confluent metastatic disease in the central liver near the hepatic duct confluence measuring 3.9 x 5.4 cm. MRCP images show mild intrahepatic biliary duct dilatation with marked attenuation of the  bile duct in the region of the porta hepatis. Common duct cannot be  traced through the porta hepatis into the head of the pancreas and common duct inclusion in the hepatoduodenal ligament is also concern, but this is not well evaluated given motion artifact. 7 mm gallstone evident with apparent gallbladder wall thickening and/ or pericholecystic edema Pancreas: Not well seen due to motion artifact from patient difficulty with reproducible breath holding. No dilatation the main pancreatic duct. Spleen: No splenomegaly. No focal mass lesion. Adrenals/Urinary Tract: No adrenal nodule or mass. Right kidney unremarkable. There is mild left hydroureteronephrosis although full extent of the distended left ureter not visualized. Stomach/Bowel: Stomach is nondistended. No gastric wall thickening. No evidence of outlet obstruction. Duodenum is normally positioned as is the ligament of Treitz. No small bowel or colonic dilatation within the visualized abdomen. Vascular/Lymphatic: No abdominal aortic aneurysm. Probable lymphadenopathy in the porta hepatis. Other: Small volume intraperitoneal free fluid evident. Musculoskeletal: Multiple foci of abnormal marrow enhancement compatible with bony metastatic disease. IMPRESSION: 1. Mild intrahepatic biliary duct distension with marked attenuation of the common duct in the region of the porta hepatis, likely due to metastatic disease in the central liver with metastatic lymphadenopathy in the porta hepatis also a consideration. The distal common bile duct is not well seen as study is fairly motion degraded and an occlusion of the common bile duct in the porta hepatis towards the head of pancreas is also concern. No evidence for choledocholithiasis. 2. Cholelithiasis with edema or fluid in/around the gallbladder wall. As noted on ultrasound exam earlier today, cholecystitis is a concern. 3. Bulky metastatic disease in the liver as seen on recent CT scan. Electronically Signed: By:  Misty Stanley M.D. On: 02/03/2017 18:23   US Abdomen Limited Ruq  Result Date: 02/03/2017 CLINICAL DATA:  Elevated LFTs. Breast cancer with known metastatic disease to the liver. EXAM: ULTRASOUND ABDOMEN LIMITED RIGHT UPPER QUADRANT COMPARISON:  None. FINDINGS: Gallbladder: Gallbladder wall thickening measures 4 mm. Shadowing stones are present at the fundus, measuring up to 1.2 cm. There is sludge. No sonographic Murphy's sign is reported. However, the patient has received pain medication. Common bile duct: Diameter: 5.0 mm, within normal limits. Liver: Multiple heterogeneous hypoechoic lesions are again seen. The lesion posteriorly in the right frontal lobe measures 4.0 x 4.5 x 4.1 cm. More laterally in the right lobe the lesion measures 3.6 x 3.3 x 3.0 cm. The lesion in the left lobe measures 2.4 x 1.8 x 2.6 cm. These lesions are similar to the prior ultrasound. Portal vein is patent on color Doppler imaging with normal direction of blood flow towards the liver. IMPRESSION: 1. Gallbladder wall thickening and 1.2 cm gallstone suggesting acute cholecystitis. 2. Multiple metastatic lesions again noted within the liver. Electronically Signed   By: San Morelle M.D.   On: 02/03/2017 14:54    Assessment and Plan:   1. Elevated troponin Troponin was found elevated to 0.05 on 02/03/17 in the setting of acute cholecystitis. Pt denies anginal symptoms and EKG is unremarkable for acute ischemia. Given her clinical picture of acute cholecystitis and widespread metastatic disease, we will not pursue further ischemic evaluation at this time. The patient is asymptomatic from a cardiac standpoint. Echocardiogram will guide medication decision. The patient and sister at bedside are in agreement with deferring further cardiac assessment until after she recovers from her current cholecystitis.     For questions or updates, please contact [place] Please consult [email or web address] for contact info under  Cardiology/STEMI.   Signed, Tami Lin Duke, PA  02/05/2017 8:30 AM

## 2017-02-05 NOTE — Progress Notes (Signed)
Subjective: Encounter performed with Hispanic language interpreter throughout. Awake and alert.  Right upper quadrant pain reported.  No emesis recorded. Afebrile.  Heart rate 86. Total bilirubin 6.0.  Hemoglobin 10.6.  WBC 23,700.  Creatinine 0.69.  Objective: Vital signs in last 24 hours: Temp:  [98.1 F (36.7 C)-98.9 F (37.2 C)] 98.1 F (36.7 C) (11/19 0334) Pulse Rate:  [84-111] 86 (11/19 0334) Resp:  [18-27] 22 (11/19 0334) BP: (94-120)/(60-91) 94/80 (11/19 0334) SpO2:  [98 %-100 %] 99 % (11/19 0334) Weight:  [60.6 kg (133 lb 9.6 oz)] 60.6 kg (133 lb 9.6 oz) (11/18 1927)    Intake/Output from previous day: 11/18 0701 - 11/19 0700 In: 1100 [I.V.:800; IV Piggyback:300] Out: -  Intake/Output this shift: Total I/O In: 1000 [I.V.:700; IV Piggyback:300] Out: -   General appearance: Alert and cooperative.  Minimal distress.  Skin warm and dry.  Does not appear toxic.  Alert and oriented with normal mental status Resp: clear to auscultation bilaterally GI: Soft.  Borderline distended.  Maximum tenderness and right upper quadrant but no mass guarding or peritoneal signs.  Nonobstructive bowel sounds.  Lab Results:  Recent Labs    02/03/17 1139 02/04/17 1618  WBC 24.1* 23.7*  HGB 11.0* 10.6*  HCT 33.1* 32.5*  PLT 174 151   BMET Recent Labs    02/03/17 1139 02/04/17 1618  NA 138 138  K 3.0* 3.3*  CL 102 107  CO2 22 20*  GLUCOSE 112* 65  BUN 6 6  CREATININE 0.70 0.69  CALCIUM 7.7* 7.1*   PT/INR No results for input(s): LABPROT, INR in the last 72 hours. ABG No results for input(s): PHART, HCO3 in the last 72 hours.  Invalid input(s): PCO2, PO2  Studies/Results: Mr Abdomen With Mrcp W Contrast  Addendum Date: 02/03/2017   ADDENDUM REPORT: 02/03/2017 18:28 ADDENDUM: As mentioned in the body of the report, there is mild left hydroureteronephrosis of indeterminate etiology, but new since 12/22/2016. Electronically Signed   By: Misty Stanley M.D.   On:  02/03/2017 18:28   Result Date: 02/03/2017 CLINICAL DATA:  Breast cancer with metastatic disease to the liver. Abdominal pain and fever. Ultrasound earlier today suggested acute cholecystitis. EXAM: MRI ABDOMEN WITHOUT CONTRAST  (INCLUDING MRCP) TECHNIQUE: Multiplanar multisequence MR imaging of the abdomen was performed. Heavily T2-weighted images of the biliary and pancreatic ducts were obtained, and three-dimensional MRCP images were rendered by post processing. COMPARISON:  Ultrasound exam earlier today.  Abdomen CT 12/22/2016 FINDINGS: Lower chest:  Small right pleural effusion. Hepatobiliary: Multiple liver mets are identified with 1 of the more dominant lesions measuring 3.4 cm in the posterior right hepatic lobe. There is an area confluent metastatic disease in the central liver near the hepatic duct confluence measuring 3.9 x 5.4 cm. MRCP images show mild intrahepatic biliary duct dilatation with marked attenuation of the bile duct in the region of the porta hepatis. Common duct cannot be traced through the porta hepatis into the head of the pancreas and common duct inclusion in the hepatoduodenal ligament is also concern, but this is not well evaluated given motion artifact. 7 mm gallstone evident with apparent gallbladder wall thickening and/ or pericholecystic edema Pancreas: Not well seen due to motion artifact from patient difficulty with reproducible breath holding. No dilatation the main pancreatic duct. Spleen: No splenomegaly. No focal mass lesion. Adrenals/Urinary Tract: No adrenal nodule or mass. Right kidney unremarkable. There is mild left hydroureteronephrosis although full extent of the distended left ureter not visualized. Stomach/Bowel: Stomach  is nondistended. No gastric wall thickening. No evidence of outlet obstruction. Duodenum is normally positioned as is the ligament of Treitz. No small bowel or colonic dilatation within the visualized abdomen. Vascular/Lymphatic: No abdominal  aortic aneurysm. Probable lymphadenopathy in the porta hepatis. Other: Small volume intraperitoneal free fluid evident. Musculoskeletal: Multiple foci of abnormal marrow enhancement compatible with bony metastatic disease. IMPRESSION: 1. Mild intrahepatic biliary duct distension with marked attenuation of the common duct in the region of the porta hepatis, likely due to metastatic disease in the central liver with metastatic lymphadenopathy in the porta hepatis also a consideration. The distal common bile duct is not well seen as study is fairly motion degraded and an occlusion of the common bile duct in the porta hepatis towards the head of pancreas is also concern. No evidence for choledocholithiasis. 2. Cholelithiasis with edema or fluid in/around the gallbladder wall. As noted on ultrasound exam earlier today, cholecystitis is a concern. 3. Bulky metastatic disease in the liver as seen on recent CT scan. Electronically Signed: By: Misty Stanley M.D. On: 02/03/2017 18:23   US Abdomen Limited Ruq  Result Date: 02/03/2017 CLINICAL DATA:  Elevated LFTs. Breast cancer with known metastatic disease to the liver. EXAM: ULTRASOUND ABDOMEN LIMITED RIGHT UPPER QUADRANT COMPARISON:  None. FINDINGS: Gallbladder: Gallbladder wall thickening measures 4 mm. Shadowing stones are present at the fundus, measuring up to 1.2 cm. There is sludge. No sonographic Murphy's sign is reported. However, the patient has received pain medication. Common bile duct: Diameter: 5.0 mm, within normal limits. Liver: Multiple heterogeneous hypoechoic lesions are again seen. The lesion posteriorly in the right frontal lobe measures 4.0 x 4.5 x 4.1 cm. More laterally in the right lobe the lesion measures 3.6 x 3.3 x 3.0 cm. The lesion in the left lobe measures 2.4 x 1.8 x 2.6 cm. These lesions are similar to the prior ultrasound. Portal vein is patent on color Doppler imaging with normal direction of blood flow towards the liver. IMPRESSION: 1.  Gallbladder wall thickening and 1.2 cm gallstone suggesting acute cholecystitis. 2. Multiple metastatic lesions again noted within the liver. Electronically Signed   By: San Morelle M.D.   On: 02/03/2017 14:54    Anti-infectives: Anti-infectives (From admission, onward)   Start     Dose/Rate Route Frequency Ordered Stop   02/05/17 0000  metroNIDAZOLE (FLAGYL) IVPB 500 mg     500 mg 100 mL/hr over 60 Minutes Intravenous Every 8 hours 02/04/17 2037     02/04/17 2045  ciprofloxacin (CIPRO) IVPB 400 mg     400 mg 200 mL/hr over 60 Minutes Intravenous Every 12 hours 02/04/17 2035     02/04/17 2045  metroNIDAZOLE (FLAGYL) IVPB 500 mg  Status:  Discontinued     500 mg 100 mL/hr over 60 Minutes Intravenous Every 8 hours 02/04/17 2035 02/04/17 2037      Assessment/Plan:  Breast cancer with bulky liver metastasis, brain metastasis. Jaundice, likely obstructive secondary to tumor Gallbladder distention.  This may simply be due to obstruction or she may have superimposed acute cholecystitis.  The goals of care or to relieve her biliary obstruction, if possible, to control sepsis, and palliate her symptoms as best as possible. -Cipro and Flagyl seem reasonable -Advise GI consultation this morning for ERCP.  We need to anatomically define the level of obstruction.  If distal CBD obstruction perhaps a stent and percutaneous cholecystostomy could help.  If Proximal obstruction she may need transhepatic drainage in interventional radiology. -I agree that open  or laparoscopic cholecystectomy is relatively contraindicated and  unlikely to be helpful.   LOS: 1 day    Adin Hector 02/05/2017

## 2017-02-06 ENCOUNTER — Inpatient Hospital Stay: Payer: Medicare HMO

## 2017-02-06 ENCOUNTER — Inpatient Hospital Stay: Payer: Medicare HMO | Admitting: Hematology and Oncology

## 2017-02-06 LAB — COMPREHENSIVE METABOLIC PANEL
ALK PHOS: 381 U/L — AB (ref 38–126)
ALT: 101 U/L — AB (ref 14–54)
AST: 144 U/L — AB (ref 15–41)
Albumin: 2.5 g/dL — ABNORMAL LOW (ref 3.5–5.0)
Anion gap: 9 (ref 5–15)
CALCIUM: 6.7 mg/dL — AB (ref 8.9–10.3)
CHLORIDE: 107 mmol/L (ref 101–111)
CO2: 19 mmol/L — ABNORMAL LOW (ref 22–32)
CREATININE: 0.81 mg/dL (ref 0.44–1.00)
GFR calc Af Amer: >60 mL/min (ref >60)
Glucose, Bld: 100 mg/dL — ABNORMAL HIGH (ref 65–99)
Potassium: 2.7 mmol/L — CL (ref 3.5–5.1)
Sodium: 135 mmol/L (ref 135–145)
Total Bilirubin: 7.9 mg/dL — ABNORMAL HIGH (ref 0.3–1.2)
Total Protein: 5.3 g/dL — ABNORMAL LOW (ref 6.5–8.1)

## 2017-02-06 LAB — BASIC METABOLIC PANEL
ANION GAP: 7 (ref 5–15)
BUN: <5 mg/dL — ABNORMAL LOW (ref 6–20)
CO2: 21 mmol/L — AB (ref 22–32)
Calcium: 6.9 mg/dL — ABNORMAL LOW (ref 8.9–10.3)
Chloride: 107 mmol/L (ref 101–111)
Creatinine, Ser: 0.72 mg/dL (ref 0.44–1.00)
GLUCOSE: 134 mg/dL — AB (ref 65–99)
POTASSIUM: 3 mmol/L — AB (ref 3.5–5.1)
Sodium: 135 mmol/L (ref 135–145)

## 2017-02-06 LAB — CBC
HCT: 28.5 % — ABNORMAL LOW (ref 36.0–46.0)
Hemoglobin: 9.2 g/dL — ABNORMAL LOW (ref 12.0–15.0)
MCH: 30 pg (ref 26.0–34.0)
MCHC: 32.3 g/dL (ref 30.0–36.0)
MCV: 92.8 fL (ref 78.0–100.0)
PLATELETS: 150 10*3/uL (ref 150–400)
RBC: 3.07 MIL/uL — ABNORMAL LOW (ref 3.87–5.11)
RDW: 22.6 % — AB (ref 11.5–15.5)
WBC: 14.3 10*3/uL — AB (ref 4.0–10.5)

## 2017-02-06 LAB — AMMONIA: AMMONIA: 49 umol/L — AB (ref 9–35)

## 2017-02-06 LAB — PROTIME-INR
INR: 1.32
PROTHROMBIN TIME: 16.2 s — AB (ref 11.4–15.2)

## 2017-02-06 MED ORDER — BOOST / RESOURCE BREEZE PO LIQD
1.0000 | Freq: Three times a day (TID) | ORAL | Status: DC
  Administered 2017-02-06 – 2017-02-09 (×6): 1 via ORAL

## 2017-02-06 MED ORDER — POTASSIUM CHLORIDE 10 MEQ/100ML IV SOLN
10.0000 meq | INTRAVENOUS | Status: AC
  Administered 2017-02-06 (×4): 10 meq via INTRAVENOUS
  Filled 2017-02-06 (×4): qty 100

## 2017-02-06 MED ORDER — POTASSIUM CHLORIDE IN NACL 20-0.9 MEQ/L-% IV SOLN
INTRAVENOUS | Status: DC
  Administered 2017-02-06 – 2017-02-10 (×5): via INTRAVENOUS
  Filled 2017-02-06 (×6): qty 1000

## 2017-02-06 MED ORDER — PROMETHAZINE HCL 25 MG/ML IJ SOLN
12.5000 mg | Freq: Four times a day (QID) | INTRAMUSCULAR | Status: DC | PRN
  Administered 2017-02-06: 25 mg via INTRAVENOUS
  Administered 2017-02-07: 12.5 mg via INTRAVENOUS
  Administered 2017-02-08: 25 mg via INTRAVENOUS
  Filled 2017-02-06 (×3): qty 1

## 2017-02-06 MED ORDER — POTASSIUM CHLORIDE CRYS ER 20 MEQ PO TBCR
40.0000 meq | EXTENDED_RELEASE_TABLET | Freq: Once | ORAL | Status: DC
  Filled 2017-02-06: qty 2

## 2017-02-06 MED ORDER — MAGNESIUM SULFATE 2 GM/50ML IV SOLN
2.0000 g | Freq: Once | INTRAVENOUS | Status: AC
  Administered 2017-02-06: 2 g via INTRAVENOUS
  Filled 2017-02-06: qty 50

## 2017-02-06 MED ORDER — POTASSIUM CHLORIDE CRYS ER 20 MEQ PO TBCR
40.0000 meq | EXTENDED_RELEASE_TABLET | Freq: Once | ORAL | Status: AC
  Administered 2017-02-06: 40 meq via ORAL
  Filled 2017-02-06: qty 2

## 2017-02-06 NOTE — Progress Notes (Signed)
Melinda Tanner is a 46 y.o. female patient admitted from ED awake, alert - oriented  X 4 - no acute distress noted.  VSS - Blood pressure 101/61, pulse (!) 102, temperature 98.5 F (36.9 C), temperature source Oral, resp. rate 20, height 5\' 3"  (1.6 m), weight 63.1 kg (139 lb 1.8 oz), last menstrual period 03/19/2015, SpO2 100 %.    IV in place, occlusive dsg intact without redness.  Orientation to room, and floor completed with information packet given to patient/family.  Patient declined safety video at this time.  Admission INP armband ID verified with patient/family, and in place.   SR up x 2, fall assessment complete, with patient and family able to verbalize understanding of risk associated with falls, and verbalized understanding to call nsg before up out of bed.  Call light within reach, patient able to voice, and demonstrate understanding.  Skin, clean-dry- intact without evidence of bruising, or skin tears.   No evidence of skin break down noted on exam.     Will cont to eval and treat per MD orders.  Dorris Carnes, RN 02/06/2017 5:37 PM

## 2017-02-06 NOTE — Progress Notes (Signed)
Initial Nutrition Assessment  DOCUMENTATION CODES:   Severe malnutrition in context of chronic illness  INTERVENTION:    Boost Breeze po TID, each supplement provides 250 kcal and 9 grams of protein   Monitor magnesium, potassium, and phosphorus daily for at least 3 days, MD to replete as needed, as pt is at risk for refeeding syndrome given present malnutrition, ongoing poor oral intake and significant weight loss.   When appropriate, advance supplement to Ensure Enlive po BID, each supplement provides 350 kcal and 20 grams of protein  NUTRITION DIAGNOSIS:   Severe Malnutrition related to cancer and cancer related treatments, chronic illness as evidenced by energy intake < or equal to 75% for > or equal to 1 month, percent weight loss(11% in 6 months).  GOAL:   Patient will meet greater than or equal to 90% of their needs  MONITOR:   PO intake, Supplement acceptance, Diet advancement, Weight trends, I & O's  REASON FOR ASSESSMENT:   Malnutrition Screening Tool    ASSESSMENT:   Pt with PMH of seizure disorder and metastatic breast cancer to liver, brain and bone presents with obstructive jaundice and persistent N/V found acute cholecystitis with multiple metastatic lesions within the liver.   Discussed pt with RN. RN reports pt tolerated clear liquid tray this morning fairly well but developed nausea while consuming liquid potassium.  Spoke with pt and pt's husband at bedside. Pt reports decreased appetite over the past 2 months. Per husband, she has been consuming up to 2 "child size" meals and occasionally 1-1.5 Ensures per day for > 2 months.   Pt and pt's husband report she has lost 20 lbs in about 2 months and a UBW of 160 lbs. Per chart, last time pt weighed this was in March 2018. However pt's chart reveals significant weight loss of 17 lbs in 6 months, 11% body weight.   Pt is net positive 2.8 L since admission.   Labs reviewed; K 2.7, BUN <5, Magnesium 1.6  (11/18), Albumin 2.5, Ammonia 49, Total Bilirubin 7.9, Hemoglobin 9.2  Medications reviewed; Protonix  NUTRITION - FOCUSED PHYSICAL EXAM:    Most Recent Value  Orbital Region  Mild depletion  Upper Arm Region  Mild depletion  Thoracic and Lumbar Region  No depletion  Buccal Region  No depletion  Temple Region  Moderate depletion  Clavicle Bone Region  Mild depletion  Clavicle and Acromion Bone Region  Mild depletion  Scapular Bone Region  Mild depletion  Dorsal Hand  No depletion  Patellar Region  Mild depletion  Anterior Thigh Region  Mild depletion  Posterior Calf Region  No depletion  Edema (RD Assessment)  None  Eyes  Other (Comment) [jaundice]  Skin  Other (Comment) [jaundice]       Diet Order:  Diet clear liquid Room service appropriate? Yes; Fluid consistency: Thin  EDUCATION NEEDS:   No education needs have been identified at this time  Skin:  Skin Assessment: Reviewed RN Assessment  Last BM:  02/02/17  Height:   Ht Readings from Last 1 Encounters:  02/04/17 5\' 3"  (1.6 m)    Weight:   Wt Readings from Last 1 Encounters:  02/06/17 137 lb 9.1 oz (62.4 kg)    Ideal Body Weight:  52.3 kg  BMI:  Body mass index is 24.37 kg/m.  Estimated Nutritional Needs:   Kcal:  1600-1800  Protein:  80-95 grams  Fluid:  >/= 1.5 L/d  Parks Ranger, MS, RDN, LDN 02/06/2017 12:09 PM

## 2017-02-06 NOTE — Progress Notes (Signed)
Report received from 3 M. Patient arrived to the unit at 1700 via bed.

## 2017-02-06 NOTE — Progress Notes (Signed)
CC: Jaundice, nausea and vomiting  Subjective: Still complaining of pain RUQ, she had complained of pain left flank but that does not seem to bother her right now.  Pain is better and she feels better on abx.    Objective: Vital signs in last 24 hours: Temp:  [98.1 F (36.7 C)-99.1 F (37.3 C)] 98.1 F (36.7 C) (11/20 0755) Pulse Rate:  [91-113] 107 (11/20 0755) Resp:  [20-28] 25 (11/20 0755) BP: (85-108)/(70-83) 85/70 (11/20 0755) SpO2:  [98 %-100 %] 100 % (11/20 0755) Weight:  [62.4 kg (137 lb 9.1 oz)] 62.4 kg (137 lb 9.1 oz) (11/20 0500) Last BM Date: 02/02/17 1950 IV 975 urine Afebrile, tachycardia, BP down at 7 AM K+ 2.7 Creatinine is 0.81 LFT's continue to rise WBC is down CT chest 11/19 - Negative for PE 1. Mild intrahepatic biliary duct distension with marked attenuation of the common duct in the region of the porta hepatis, likely due to metastatic disease in the central liver with metastatic lymphadenopathy in the porta hepatis also a consideration. The distal common bile duct is not well seen as study is fairly motion degraded and an occlusion of the common bile duct in the porta hepatis towards the head of pancreas is also concern. No evidence for choledocholithiasis. 2. Cholelithiasis with edema or fluid in/around the gallbladder wall. As noted on ultrasound exam earlier today, cholecystitis is a concern. 3. Bulky metastatic disease in the liver as seen on recent CT scan.  Intake/Output from previous day: 11/19 0701 - 11/20 0700 In: 1950 [I.V.:1250; IV Piggyback:700] Out: 975 [Urine:975] Intake/Output this shift: No intake/output data recorded.  General appearance: alert, cooperative, no distress and tired and worn out.  GI: Tender RUQ, but better than before.  No significant distension   Lab Results:  Recent Labs    02/05/17 0600 02/06/17 0530  WBC 19.3* 14.3*  HGB 9.0* 9.2*  HCT 28.1* 28.5*  PLT 136* 150    BMET Recent Labs     02/05/17 0600 02/06/17 0530  NA 136 135  K 3.7 2.7*  CL 109 107  CO2 17* 19*  GLUCOSE 76 100*  BUN <5* <5*  CREATININE 0.83 0.81  CALCIUM 6.5* 6.7*   PT/INR Recent Labs    02/06/17 0530  LABPROT 16.2*  INR 1.32    Recent Labs  Lab 02/03/17 1139 02/04/17 1618 02/05/17 0600 02/06/17 0530  AST 168* 164* 144* 144*  ALT 97* 111* 94* 101*  ALKPHOS 359* 387* 360* 381*  BILITOT 3.8* 6.0* 6.2* 7.9*  PROT 6.6 6.2* 5.1* 5.3*  ALBUMIN 3.3* 2.9* 2.4* 2.5*     Lipase  No results found for: LIPASE   Medications: . chlorhexidine  15 mL Mouth Rinse BID  . enoxaparin (LOVENOX) injection  40 mg Subcutaneous Q24H  . mouth rinse  15 mL Mouth Rinse q12n4p  . pantoprazole  40 mg Oral Daily   . sodium chloride    . ciprofloxacin Stopped (02/06/17 1039)  . magnesium sulfate 1 - 4 g bolus IVPB 2 g (02/06/17 1025)  . metronidazole Stopped (02/06/17 1039)  . potassium chloride Stopped (02/06/17 1039)   Anti-infectives (From admission, onward)   Start     Dose/Rate Route Frequency Ordered Stop   02/05/17 0000  metroNIDAZOLE (FLAGYL) IVPB 500 mg     500 mg 100 mL/hr over 60 Minutes Intravenous Every 8 hours 02/04/17 2037     02/04/17 2045  ciprofloxacin (CIPRO) IVPB 400 mg     400 mg  200 mL/hr over 60 Minutes Intravenous Every 12 hours 02/04/17 2035     02/04/17 2045  metroNIDAZOLE (FLAGYL) IVPB 500 mg  Status:  Discontinued     500 mg 100 mL/hr over 60 Minutes Intravenous Every 8 hours 02/04/17 2035 02/04/17 2037      Assessment/Plan Metastatic breast cancer, liver, and brain Jaundice/RUQ pain - likely obstructive;  secondary to tumor Gallbladder distension/cholelithiasis/possible cholecystitis Circumferential pericardial effusion Weight loss FEN:  Clears liquids/IV fluids ID:  Cipro/Flagyl 11/18 =>>day 3 DVT:  Lovneox Foley:  None Follow up:  TBD   Plan:  Dr. Therisa Doyne recommending percutaneous drainage by IR.  We will be available.       LOS: 2 days     Hernan Turnage 02/06/2017 [phone]

## 2017-02-06 NOTE — Progress Notes (Signed)
IR consulted for possible percutaneous cholecystostomy placement at the request of Dr. Therisa Doyne. Reviewed imaging and case with Dr. Kathlene Cote.  Patient without gall bladder distention despite increasing Tbili.  Her WBC has improved some since admission.  Patient is high risk for drain placement, especially given the likelihood of metastatic disease contributing to her presentation.  Appears pain is her main presenting symptom which may not be improved with drain placement.  Note GI has evaluated the patient and feel an ERCP may have limited role as well.  Recommend obtaining Oncology input on disease status and prognosis.   Brynda Greathouse, MS RD PA-C 4:19 PM

## 2017-02-06 NOTE — Progress Notes (Signed)
[place] TEAM 1 - Stepdown/ICU TEAM  Melinda Tanner  ZOX:[id number] DOB: [birth year] DOA: 02/04/2017 PCP: Theotis Burrow, MD    Brief Narrative:  46 y.o. female w/ metastatic breast cancer to liver brain and bone, and a seizure d/o who presented to [facility] ED w/ c/o persisting nausea and vomiting.  In the ED a RUQ [place] noted gallbladder wall thickening and a 1.2 cm gallstone suggesting acute cholecystitis, w/ multiple metastatic lesions within the liver.  MRCP noted mild intrahepatic biliary duct distension with marked attenuation of the common duct in the region of the porta hepatis, likely due to metastatic disease in the central liver with metastatic lymphadenopathy in the porta hepatis also a consideration. The distal common bile duct was not well seen and an occlusion of the common bile duct in the porta hepatis towards the head of pancreas was also a concern.  The pt was transferred to [facility] for evaluations by GI and Surgery.    Significant Events: 11/18 presented to [facility] ED - transferred to [facility]   Subjective: Continues to look generally uncomfortable.  Reports that her nausea persists.  Denies any vomiting at this time.  Reports ongoing mild right upper quadrant discomfort.  Denies chest pain or shortness of breath.  Assessment & Plan:  Acute cholecystitis Gen Surgery following - IR consulted to consider perc drainage    Obstructive jaundice  Likely due to metastatic breast CA/obstructing metastatic mass - GI does not feel an ERCP will benefit her and has suggested percutaneous drainage of the liver as well as GB, w/ 2 possible drains needing to be placed - IR consulted but has not yet seen the pt   Trop elevation Extremely minimal elevation - no sx - no need for further inpt evaluation   Large circumferential pericardial effusion w/o tamponade  Cardiology has evaluated - not symptomatic at this time - if becomes symptomatic a pericardial window would  be required - follow clinically   Severe Hypokalemia Recurrent - cont to replace and follow - recheck Mg  Metastatic breast cancer Diagnosed 2009 - s/p B mastectomy, chemo, and XRT  Severe malnutrition in context of chronic illness Nutrition evaluation   DVT prophylaxis: lovenox Code Status: FULL CODE Family Communication: spoke w/ husband at bedside  Disposition Plan: stable for transfer to tele bed   Consultants:  Gen Surgery  GI IR  Antimicrobials:  Cipro 11/17 > Flagyl 11/17 >   Objective: Blood pressure 99/71, pulse (!) 104, temperature 97.7 F (36.5 C), temperature source Oral, resp. rate (!) 23, height 5\' 3"  (1.6 m), weight 62.4 kg (137 lb 9.1 oz), last menstrual period 03/19/2015, SpO2 99 %.  Intake/Output Summary (Last 24 hours) at 02/06/2017 1342 Last data filed at 02/06/2017 1039 Gross per 24 hour  Intake 2300 ml  Output 1025 ml  Net 1275 ml   Filed Weights   02/04/17 1927 02/05/17 0500 02/06/17 0500  Weight: 60.6 kg (133 lb 9.6 oz) 62.8 kg (138 lb 7.2 oz) 62.4 kg (137 lb 9.1 oz)    Examination: General: No acute respiratory distress - appears uncomfortable  Lungs: CTA B - no wheeze  Cardiovascular: RRR w/o M  Abdomen: tender in RUQ - no rebound - no appreciable mass - BS+ Extremities: No C/C/E B LE   CBC: Recent Labs  Lab 02/03/17 1139 02/04/17 1618 02/05/17 0600 02/06/17 0530  WBC 24.1* 23.7* 19.3* 14.3*  NEUTROABS 21.9* 22.8*  --   --   HGB 11.0* 10.6* 9.0*  9.2*  HCT 33.1* 32.5* 28.1* 28.5*  MCV 93.0 94.7 93.7 92.8  PLT 174 151 136* 509   Basic Metabolic Panel: Recent Labs  Lab 02/03/17 1139 02/04/17 1618 02/04/17 2250 02/05/17 0600 02/06/17 0530  NA 138 138  --  136 135  K 3.0* 3.3*  --  3.7 2.7*  CL 102 107  --  109 107  CO2 22 20*  --  17* 19*  GLUCOSE 112* 65  --  76 100*  BUN 6 6  --  <5* <5*  CREATININE 0.70 0.69  --  0.83 0.81  CALCIUM 7.7* 7.1*  --  6.5* 6.7*  MG  --   --  1.6*  --   --    GFR: Estimated  Creatinine Clearance: 73.3 mL/min (by C-G formula based on SCr of 0.81 mg/dL).  Liver Function Tests: Recent Labs  Lab 02/03/17 1139 02/04/17 1618 02/05/17 0600 02/06/17 0530  AST 168* 164* 144* 144*  ALT 97* 111* 94* 101*  ALKPHOS 359* 387* 360* 381*  BILITOT 3.8* 6.0* 6.2* 7.9*  PROT 6.6 6.2* 5.1* 5.3*  ALBUMIN 3.3* 2.9* 2.4* 2.5*    Recent Labs  Lab 02/06/17 0550  AMMONIA 49*    Coagulation Profile: Recent Labs  Lab 02/06/17 0530  INR 1.32    Cardiac Enzymes: Recent Labs  Lab 02/03/17 1139 02/04/17 2250 02/05/17 0600  TROPONINI 0.05* <0.03 <0.03    Recent Results (from the past 240 hour(s))  Urine culture     Status: None   Collection Time: 02/03/17 11:39 AM  Result Value Ref Range Status   Specimen Description URINE, RANDOM  Final   Special Requests NONE  Final   Culture   Final    NO GROWTH Performed at [facility], [place] [address] [place], [place] [address]    Report Status 02/05/2017 FINAL  Final  Culture, blood (routine x 2)     Status: None (Preliminary result)   Collection Time: 02/03/17 12:45 PM  Result Value Ref Range Status   Specimen Description BLOOD BLOOD RIGHT WRIST  Final   Special Requests   Final    BOTTLES DRAWN AEROBIC AND ANAEROBIC Blood Culture results may not be optimal due to an inadequate volume of blood received in culture bottles   Culture NO GROWTH 3 DAYS  Final   Report Status PENDING  Incomplete  Culture, blood (routine x 2)     Status: None (Preliminary result)   Collection Time: 02/03/17  3:48 PM  Result Value Ref Range Status   Specimen Description BLOOD BLOOD RIGHT WRIST  Final   Special Requests   Final    BOTTLES DRAWN AEROBIC AND ANAEROBIC Blood Culture adequate volume   Culture NO GROWTH 3 DAYS  Final   Report Status PENDING  Incomplete  MRSA PCR Screening     Status: None   Collection Time: 02/04/17  7:34 PM  Result Value Ref Range Status   MRSA by PCR NEGATIVE NEGATIVE Final    Comment:        The  GeneXpert MRSA Assay (FDA approved for NASAL specimens only), is one component of a comprehensive MRSA colonization surveillance program. It is not intended to diagnose MRSA infection nor to guide or monitor treatment for MRSA infections.      Scheduled Meds: . chlorhexidine  15 mL Mouth Rinse BID  . enoxaparin (LOVENOX) injection  40 mg Subcutaneous Q24H  . feeding supplement  1 Container Oral TID BM  . mouth rinse  15 mL Mouth Rinse q12n4p  . pantoprazole  40 mg Oral Daily     LOS: 2 days   Cherene Altes, MD [facility] Office  [phone] Pager - Text Page per Amion as per below:  On-Call/Text Page:      [email or web address]      password [email or web address]  If 7PM-7AM, please contact night-coverage [email or web address] Password [email or web address] 02/06/2017, 1:42 PM

## 2017-02-06 NOTE — Progress Notes (Signed)
CRITICAL VALUE ALERT  Critical Value:  Potassium 2.7  Date & Time Notied:  2563   02/06/17  Provider Notified: Baltazar Najjar, NP   Orders Received/Actions taken:

## 2017-02-06 NOTE — Care Management Note (Signed)
Case Management Note  Patient Details  Name: Melinda Tanner MRN: [id number] Date of Birth: July 04, 1970  Subjective/Objective:   From home with spouse, who is able to assist patient  At discharge, presents with cholecystitis, obstructive jaundice, metastatic breast cancer, trop elevation, large pericardial effusion w/o tamponade, severe hypokalemia, severe malnutrition.  She has a PCP and medication coverage.               Action/Plan: NCM will follow for dc needs.  Expected Discharge Date:  02/09/17               Expected Discharge Plan:     In-House Referral:     Discharge planning Services  CM Consult  Post Acute Care Choice:    Choice offered to:     DME Arranged:    DME Agency:     HH Arranged:    HH Agency:     Status of Service:  In process, will continue to follow  If discussed at Long Length of Stay Meetings, dates discussed:    Additional Comments:  Zenon Mayo, RN 02/06/2017, 4:17 PM

## 2017-02-07 ENCOUNTER — Other Ambulatory Visit: Payer: Self-pay

## 2017-02-07 DIAGNOSIS — I313 Pericardial effusion (noninflammatory): Secondary | ICD-10-CM

## 2017-02-07 DIAGNOSIS — K7689 Other specified diseases of liver: Secondary | ICD-10-CM

## 2017-02-07 LAB — CBC
HCT: 28.5 % — ABNORMAL LOW (ref 36.0–46.0)
Hemoglobin: 9.5 g/dL — ABNORMAL LOW (ref 12.0–15.0)
MCH: 30.4 pg (ref 26.0–34.0)
MCHC: 33.3 g/dL (ref 30.0–36.0)
MCV: 91.3 fL (ref 78.0–100.0)
PLATELETS: 162 10*3/uL (ref 150–400)
RBC: 3.12 MIL/uL — ABNORMAL LOW (ref 3.87–5.11)
RDW: 22.9 % — ABNORMAL HIGH (ref 11.5–15.5)
WBC: 14.2 10*3/uL — ABNORMAL HIGH (ref 4.0–10.5)

## 2017-02-07 LAB — COMPREHENSIVE METABOLIC PANEL
ALK PHOS: 388 U/L — AB (ref 38–126)
ALT: 90 U/L — AB (ref 14–54)
AST: 125 U/L — ABNORMAL HIGH (ref 15–41)
Albumin: 2.5 g/dL — ABNORMAL LOW (ref 3.5–5.0)
Anion gap: 7 (ref 5–15)
BUN: <5 mg/dL — ABNORMAL LOW (ref 6–20)
CALCIUM: 6.8 mg/dL — AB (ref 8.9–10.3)
CO2: 21 mmol/L — AB (ref 22–32)
CREATININE: 0.68 mg/dL (ref 0.44–1.00)
Chloride: 106 mmol/L (ref 101–111)
GFR calc non Af Amer: >60 mL/min (ref >60)
GLUCOSE: 99 mg/dL (ref 65–99)
Potassium: 2.9 mmol/L — ABNORMAL LOW (ref 3.5–5.1)
SODIUM: 134 mmol/L — AB (ref 135–145)
Total Bilirubin: 7.9 mg/dL — ABNORMAL HIGH (ref 0.3–1.2)
Total Protein: 4.8 g/dL — ABNORMAL LOW (ref 6.5–8.1)

## 2017-02-07 LAB — MAGNESIUM: MAGNESIUM: 2 mg/dL (ref 1.7–2.4)

## 2017-02-07 MED ORDER — POLYETHYLENE GLYCOL 3350 17 G PO PACK
17.0000 g | PACK | Freq: Every day | ORAL | Status: DC
  Administered 2017-02-09 – 2017-02-10 (×2): 17 g via ORAL
  Filled 2017-02-07 (×4): qty 1

## 2017-02-07 MED ORDER — DOCUSATE SODIUM 100 MG PO CAPS
100.0000 mg | ORAL_CAPSULE | Freq: Two times a day (BID) | ORAL | Status: DC
  Administered 2017-02-09 – 2017-02-10 (×3): 100 mg via ORAL
  Filled 2017-02-07 (×6): qty 1

## 2017-02-07 MED ORDER — MORPHINE SULFATE (PF) 2 MG/ML IV SOLN
1.0000 mg | INTRAVENOUS | Status: DC | PRN
  Administered 2017-02-07 – 2017-02-09 (×6): 1 mg via INTRAVENOUS
  Filled 2017-02-07 (×6): qty 1

## 2017-02-07 NOTE — Progress Notes (Signed)
Pt's HR persisted in the 120's. No complaints of pain or discomfort or signs of distress. Provider on call was notified. Will continue to monitor.

## 2017-02-07 NOTE — Progress Notes (Signed)
Melinda Tanner  EHU:[id number] DOB: 1970-03-25 DOA: 02/04/2017 PCP: Theotis Burrow, MD    Brief Narrative:  46 y.o. female w/ metastatic breast cancer to liver brain and bone, and a seizure d/o who presented to [facility] ED w/ c/o persisting nausea and vomiting.  In the ED a RUQ [place] noted gallbladder wall thickening and a 1.2 cm gallstone suggesting acute cholecystitis, w/ multiple metastatic lesions within the liver.  MRCP noted mild intrahepatic biliary duct distension with marked attenuation of the common duct in the region of the porta hepatis, likely due to metastatic disease in the central liver with metastatic lymphadenopathy in the porta hepatis also a consideration. The distal common bile duct was not well seen and an occlusion of the common bile duct in the porta hepatis towards the head of pancreas was also a concern.  The pt was transferred to [facility] for evaluations by GI and Surgery.    Significant Events: 11/18 presented to [facility] ED - transferred to [facility] for evaluation by GI and CCS.   Subjective: Icteric, complaining of right upper quadrant pain. Continue to have nausea and is now experiencing vomiting with certain medications.  Assessment & Plan: Acute cholecystitis -Gen Surgery not anticipating to performed cholecystectomy -will continue supportive care and abx's -GI decline ERCP -IR not considering that percutaneous drainage will offer any help or assistance; recommended evaluation from oncology service and to pursuit palliative care only.  Obstructive jaundice  -Likely due to metastatic breast CA/obstructing metastatic mass  -GI does not feel an ERCP will benefit her and has suggested percutaneous drainage of the liver as well as GB, w/ 2 possible drains needing to be placed -IR consulted, but as mentioned above has decline intervention at this point as well -will consult oncology -continue IV antibiotics, CLD and PRN analgesics and antiemetics.     Constipation -will add miralax and colace  Trop elevation -no CP, no SOB -no ischemic changes on EKG -mild elevation in setting of metastatic disease and large pericardial effusion. -will monitor.  Large circumferential pericardial effusion w/o tamponade.  -Cardiology has evaluated  -not symptomatic at this time  -continue monitoring for now.  Severe Hypokalemia -associated with poor PO -continue maintenance with IVF containing potassium -Mg WNL  Metastatic breast cancer -Diagnosed 2009 - s/p B mastectomy, chemo, and XRT -with metastatic disease now and overall poor prognosis -?? Further radiation; oncology consulted.  Severe malnutrition in context of chronic illness -will follow Nutritional service rec's -continue breeze/boost   DVT prophylaxis: lovenox Code Status: FULL CODE Family Communication: spoke w/ daughter at bedside  Disposition Plan: remains inpatient. Follow oncology recommendations.  Consultants:  Gen Surgery  GI IR oncology  Antimicrobials:  Cipro 11/17 > Flagyl 11/17 >   Objective: Blood pressure 104/72, pulse (!) 109, temperature 98.2 F (36.8 C), temperature source Oral, resp. rate 18, height 5\' 3"  (1.6 m), weight 63 kg (138 lb 14.4 oz), last menstrual period 03/19/2015, SpO2 100 %.  Intake/Output Summary (Last 24 hours) at 02/07/2017 1801 Last data filed at 02/07/2017 1405 Gross per 24 hour  Intake 380 ml  Output -  Net 380 ml   Filed Weights   02/06/17 0500 02/06/17 1700 02/07/17 0300  Weight: 62.4 kg (137 lb 9.1 oz) 63.1 kg (139 lb 1.8 oz) 63 kg (138 lb 14.4 oz)    Examination: General: uncomfortable, complaining of RUQ and N/V.positive icterus. Lungs: no wheezing, no crackles Cardiovascular: sinus tachycardia, no rubs and no gallops heard. Abdomen: no guarding,  continue complaining of RUQ pain, positive BS Extremities: no cyanosis, no clubbing.   CBC: Recent Labs  Lab 02/03/17 1139 02/04/17 1618 02/05/17 0600  02/06/17 0530 02/07/17 0459  WBC 24.1* 23.7* 19.3* 14.3* 14.2*  NEUTROABS 21.9* 22.8*  --   --   --   HGB 11.0* 10.6* 9.0* 9.2* 9.5*  HCT 33.1* 32.5* 28.1* 28.5* 28.5*  MCV 93.0 94.7 93.7 92.8 91.3  PLT 174 151 136* 150 242   Basic Metabolic Panel: Recent Labs  Lab 02/04/17 1618 02/04/17 2250 02/05/17 0600 02/06/17 0530 02/06/17 1622 02/07/17 0459  NA 138  --  136 135 135 134*  K 3.3*  --  3.7 2.7* 3.0* 2.9*  CL 107  --  109 107 107 106  CO2 20*  --  17* 19* 21* 21*  GLUCOSE 65  --  76 100* 134* 99  BUN 6  --  <5* <5* <5* <5*  CREATININE 0.69  --  0.83 0.81 0.72 0.68  CALCIUM 7.1*  --  6.5* 6.7* 6.9* 6.8*  MG  --  1.6*  --   --   --  2.0   GFR: Estimated Creatinine Clearance: 80.2 mL/min (by C-G formula based on SCr of 0.68 mg/dL).  Liver Function Tests: Recent Labs  Lab 02/03/17 1139 02/04/17 1618 02/05/17 0600 02/06/17 0530 02/07/17 0459  AST 168* 164* 144* 144* 125*  ALT 97* 111* 94* 101* 90*  ALKPHOS 359* 387* 360* 381* 388*  BILITOT 3.8* 6.0* 6.2* 7.9* 7.9*  PROT 6.6 6.2* 5.1* 5.3* 4.8*  ALBUMIN 3.3* 2.9* 2.4* 2.5* 2.5*    Recent Labs  Lab 02/06/17 0550  AMMONIA 49*    Coagulation Profile: Recent Labs  Lab 02/06/17 0530  INR 1.32    Cardiac Enzymes: Recent Labs  Lab 02/03/17 1139 02/04/17 2250 02/05/17 0600  TROPONINI 0.05* <0.03 <0.03    Recent Results (from the past 240 hour(s))  Urine culture     Status: None   Collection Time: 02/03/17 11:39 AM  Result Value Ref Range Status   Specimen Description URINE, RANDOM  Final   Special Requests NONE  Final   Culture   Final    NO GROWTH Performed at [facility], [address], [place], [place] [address]    Report Status 02/05/2017 FINAL  Final  Culture, blood (routine x 2)     Status: None (Preliminary result)   Collection Time: 02/03/17 12:45 PM  Result Value Ref Range Status   Specimen Description BLOOD BLOOD RIGHT WRIST  Final   Special Requests   Final    BOTTLES DRAWN  AEROBIC AND ANAEROBIC Blood Culture results may not be optimal due to an inadequate volume of blood received in culture bottles   Culture NO GROWTH 4 DAYS  Final   Report Status PENDING  Incomplete  Culture, blood (routine x 2)     Status: None (Preliminary result)   Collection Time: 02/03/17  3:48 PM  Result Value Ref Range Status   Specimen Description BLOOD BLOOD RIGHT WRIST  Final   Special Requests   Final    BOTTLES DRAWN AEROBIC AND ANAEROBIC Blood Culture adequate volume   Culture NO GROWTH 4 DAYS  Final   Report Status PENDING  Incomplete  MRSA PCR Screening     Status: None   Collection Time: 02/04/17  7:34 PM  Result Value Ref Range Status   MRSA by PCR NEGATIVE NEGATIVE Final    Comment:  The GeneXpert MRSA Assay (FDA approved for NASAL specimens only), is one component of a comprehensive MRSA colonization surveillance program. It is not intended to diagnose MRSA infection nor to guide or monitor treatment for MRSA infections.      Scheduled Meds: . chlorhexidine  15 mL Mouth Rinse BID  . docusate sodium  100 mg Oral BID  . enoxaparin (LOVENOX) injection  40 mg Subcutaneous Q24H  . feeding supplement  1 Container Oral TID BM  . mouth rinse  15 mL Mouth Rinse q12n4p  . pantoprazole  40 mg Oral Daily  . polyethylene glycol  17 g Oral Daily     LOS: 3 days   Barton Dubois MD [facility] [phone]  If 7PM-7AM, please contact night-coverage [email or web address] Password [email or web address] 02/07/2017, 6:01 PM

## 2017-02-07 NOTE — Progress Notes (Signed)
MD notified of patient vomiting up pain medicine PO. She received phenergan. Patient is having a hard time swallowing pills. Patient and family have requested to have IV medications instead of pills. MD ordered IV morphine 1 mg.

## 2017-02-08 DIAGNOSIS — R110 Nausea: Secondary | ICD-10-CM

## 2017-02-08 DIAGNOSIS — R5383 Other fatigue: Secondary | ICD-10-CM

## 2017-02-08 DIAGNOSIS — C787 Secondary malignant neoplasm of liver and intrahepatic bile duct: Secondary | ICD-10-CM

## 2017-02-08 DIAGNOSIS — R17 Unspecified jaundice: Secondary | ICD-10-CM

## 2017-02-08 LAB — CULTURE, BLOOD (ROUTINE X 2)
CULTURE: NO GROWTH
Culture: NO GROWTH
SPECIAL REQUESTS: ADEQUATE

## 2017-02-08 MED ORDER — LORAZEPAM 2 MG/ML IJ SOLN: 0.5000 mg | Freq: Three times a day (TID) | INTRAMUSCULAR | Status: DC | PRN

## 2017-02-08 MED ORDER — PANTOPRAZOLE SODIUM 40 MG IV SOLR
40.0000 mg | INTRAVENOUS | Status: DC
  Administered 2017-02-08 – 2017-02-10 (×3): 40 mg via INTRAVENOUS
  Filled 2017-02-08 (×3): qty 40

## 2017-02-08 MED ORDER — METOCLOPRAMIDE HCL 5 MG/ML IJ SOLN
10.0000 mg | Freq: Three times a day (TID) | INTRAMUSCULAR | Status: DC
  Administered 2017-02-08 – 2017-02-10 (×5): 10 mg via INTRAVENOUS
  Filled 2017-02-08 (×5): qty 2

## 2017-02-08 MED ORDER — PROMETHAZINE HCL 25 MG/ML IJ SOLN
25.0000 mg | Freq: Four times a day (QID) | INTRAMUSCULAR | Status: DC | PRN
  Filled 2017-02-08: qty 1

## 2017-02-08 NOTE — Progress Notes (Signed)
Melinda Tanner  NKN:[id number] DOB: 02/18/1971 DOA: 02/04/2017 PCP: Theotis Burrow, MD    Brief Narrative:  46 y.o. 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  In the ED a RUQ [place] noted gallbladder wall thickening and a 1.2 cm gallstone suggesting acute cholecystitis, w/ multiple metastatic lesions within the liver.  MRCP noted mild intrahepatic biliary duct distension with marked attenuation of the common duct in the region of the porta hepatis, likely due to metastatic disease in the central liver with metastatic lymphadenopathy in the porta hepatis also a consideration. The distal common bile duct was not well seen and an occlusion of the common bile duct in the porta hepatis towards the head of pancreas was also a concern.  The pt was transferred to [facility] for evaluations by GI and Surgery.    Significant Events: 11/18 presented to [facility] ED - transferred to [facility] for evaluation by GI and CCS.   Subjective: Positive jaundice and ectasia.  Patient continue complaining of right upper quadrant, nausea, vomiting and is very anxious.  Assessment & Plan: Acute cholecystitis -Gen Surgery not anticipating to performed cholecystectomy -will continue supportive care and abx's -GI decline ERCP -IR not considering that percutaneous drainage will offer any help or assistance and requested evaluation by oncology;oncology recommended palliative drain if possible and to focus on palliative care -will start IV medications (as patient having significant nausea and vomiting) -PRN ativan, morphine and schedule reglan -will follow rec's from palliative care.  Obstructive jaundice/concern for cholangitis -Likely due to metastatic breast CA/obstructing metastatic mass  -GI does not feel an ERCP will benefit her and has suggested percutaneous drainage of the liver as well as GB, w/ 2  possible drains needing to be place. -IR consulted, but as mentioned above has decline intervention at this point and recommended oncology evaluation. -Following oncology recommendations there is no role of any active chemotherapy or radiation at this moment.  They recommend to reassess the potentiality of any kind of drain that will allow a decrease in patient's bilirubin and drainage of overall liver congestion.  Also recommended palliative care to get involved. -Palliative care has been consulted and will touch base with IR for potential treatment into some kind of palliative drain that can assist patient's condition. -continue IV antibiotics, CLD and PRN analgesics and antiemetics.   Constipation -Patient had bowel movement overnight -Continue MiraLAX and Colace.    Trop elevation -no CP, no SOB -no ischemic changes on EKG -mild elevation in setting of metastatic disease and large pericardial effusion. -Continue to monitor.    Large circumferential pericardial effusion w/o tamponade.  -Cardiology has evaluated patient and given the fact she is no symptomatic at this time no recommendations for pericardial window or further intervention needed. -continue monitoring for now.  Severe Hypokalemia -associated with poor PO -continue maintenance with IVF containing potassium -Last magnesium level check demonstrates to be WNL -Continue to monitor electrolytes intermittently.  Metastatic breast cancer -Diagnosed 2009 - s/p B mastectomy, chemo, and XRT -with metastatic disease to bone and liver; patient doing poorly and already on salvage chemotherapy.  -Discussing with oncology service there is no role for further radiation and has recommended palliative care involvement.    Severe malnutrition in context of chronic illness -will follow Nutritional service rec's -continue breeze/boost  -Continue clear liquid diet  DVT prophylaxis: lovenox Code Status: FULL CODE Family  Communication: spoke w/ sister at bedside  Disposition Plan: remains inpatient. Will Follow oncology recommendations and once again ask for potential intervention by IR.  Consultants:  Gen Surgery  GI IR oncology  Antimicrobials:  Cipro 11/17 > Flagyl 11/17 >   Objective: Blood pressure 112/79, pulse (!) 110, temperature 98.8 F (37.1 C), temperature source Oral, resp. rate 18, height 5\' 3"  (1.6 m), weight 63 kg (138 lb 14.4 oz), last menstrual period 03/19/2015, SpO2 99 %.  Intake/Output Summary (Last 24 hours) at 02/08/2017 1350 Last data filed at 02/08/2017 0600 Gross per 24 hour  Intake 1280 ml  Output 1 ml  Net 1279 ml   Filed Weights   02/06/17 0500 02/06/17 1700 02/07/17 0300  Weight: 62.4 kg (137 lb 9.1 oz) 63.1 kg (139 lb 1.8 oz) 63 kg (138 lb 14.4 oz)    Examination: General: Anxious, complaining of nausea, mild distress and reporting right upper quadrant pain. Patient with jaundice/icterus on exam and complaining of nausea and vomiting. Lungs: Good air movement bilaterally, no wheezing, no crackles. Cardiovascular: Sinus tachycardia, no rubs, no gallops no JVD.   Abdomen: Soft, right upper quadrant tenderness with palpation, no guarding, positive bowel sounds, no distention appreciated.   Extremities: No cyanosis, no clubbing, no erythema on her lower extremities.  No joint swelling.    CBC: Recent Labs  Lab 02/03/17 1139 02/04/17 1618 02/05/17 0600 02/06/17 0530 02/07/17 0459  WBC 24.1* 23.7* 19.3* 14.3* 14.2*  NEUTROABS 21.9* 22.8*  --   --   --   HGB 11.0* 10.6* 9.0* 9.2* 9.5*  HCT 33.1* 32.5* 28.1* 28.5* 28.5*  MCV 93.0 94.7 93.7 92.8 91.3  PLT 174 151 136* 150 259   Basic Metabolic Panel: Recent Labs  Lab 02/04/17 1618 02/04/17 2250 02/05/17 0600 02/06/17 0530 02/06/17 1622 02/07/17 0459  NA 138  --  136 135 135 134*  K 3.3*  --  3.7 2.7* 3.0* 2.9*  CL 107  --  109 107 107 106  CO2 20*  --  17* 19* 21* 21*  GLUCOSE 65  --  76 100* 134*  99  BUN 6  --  <5* <5* <5* <5*  CREATININE 0.69  --  0.83 0.81 0.72 0.68  CALCIUM 7.1*  --  6.5* 6.7* 6.9* 6.8*  MG  --  1.6*  --   --   --  2.0   GFR: Estimated Creatinine Clearance: 80.2 mL/min (by C-G formula based on SCr of 0.68 mg/dL).  Liver Function Tests: Recent Labs  Lab 02/03/17 1139 02/04/17 1618 02/05/17 0600 02/06/17 0530 02/07/17 0459  AST 168* 164* 144* 144* 125*  ALT 97* 111* 94* 101* 90*  ALKPHOS 359* 387* 360* 381* 388*  BILITOT 3.8* 6.0* 6.2* 7.9* 7.9*  PROT 6.6 6.2* 5.1* 5.3* 4.8*  ALBUMIN 3.3* 2.9* 2.4* 2.5* 2.5*    Recent Labs  Lab 02/06/17 0550  AMMONIA 49*    Coagulation Profile: Recent Labs  Lab 02/06/17 0530  INR 1.32    Cardiac Enzymes: Recent Labs  Lab 02/03/17 1139 02/04/17 2250 02/05/17 0600  TROPONINI 0.05* <0.03 <0.03    Recent Results (from the past 240 hour(s))  Urine culture     Status: None   Collection Time: 02/03/17 11:39 AM  Result Value Ref Range Status   Specimen Description URINE, RANDOM  Final   Special Requests NONE  Final   Culture   Final    NO GROWTH Performed at [facility], [address],  [place], [place] [address]    Report Status 02/05/2017 FINAL  Final  Culture, blood (routine x 2)     Status: None   Collection Time: 02/03/17 12:45 PM  Result Value Ref Range Status   Specimen Description BLOOD BLOOD RIGHT WRIST  Final   Special Requests   Final    BOTTLES DRAWN AEROBIC AND ANAEROBIC Blood Culture results may not be optimal due to an inadequate volume of blood received in culture bottles   Culture NO GROWTH 5 DAYS  Final   Report Status 02/08/2017 FINAL  Final  Culture, blood (routine x 2)     Status: None   Collection Time: 02/03/17  3:48 PM  Result Value Ref Range Status   Specimen Description BLOOD BLOOD RIGHT WRIST  Final   Special Requests   Final    BOTTLES DRAWN AEROBIC AND ANAEROBIC Blood Culture adequate volume   Culture NO GROWTH 5 DAYS  Final   Report Status 02/08/2017 FINAL   Final  MRSA PCR Screening     Status: None   Collection Time: 02/04/17  7:34 PM  Result Value Ref Range Status   MRSA by PCR NEGATIVE NEGATIVE Final    Comment:        The GeneXpert MRSA Assay (FDA approved for NASAL specimens only), is one component of a comprehensive MRSA colonization surveillance program. It is not intended to diagnose MRSA infection nor to guide or monitor treatment for MRSA infections.      Scheduled Meds: . chlorhexidine  15 mL Mouth Rinse BID  . docusate sodium  100 mg Oral BID  . enoxaparin (LOVENOX) injection  40 mg Subcutaneous Q24H  . feeding supplement  1 Container Oral TID BM  . mouth rinse  15 mL Mouth Rinse q12n4p  . metoCLOPramide (REGLAN) injection  10 mg Intravenous Q8H  . pantoprazole (PROTONIX) IV  40 mg Intravenous Q24H  . polyethylene glycol  17 g Oral Daily     LOS: 4 days   Barton Dubois MD [facility] [phone]  If 7PM-7AM, please contact night-coverage [email or web address] Password [email or web address] 02/08/2017, 1:50 PM

## 2017-02-08 NOTE — Progress Notes (Signed)
Pt reports relief after protonix and reglan added to medication regimen (see EMAR), she reports being able to drink some soup, will monitor.

## 2017-02-08 NOTE — Consult Note (Signed)
Reason for Referral: Breast cancer.  HPI: 46 year old woman native of [place] with breast cancer dating back to 2009.  Details of her breast cancer was outlined below:   She presented with a lump in her right breast in 2009 while living in [place].  Mammogram and ultrasound on  08/15/2007 showed abnormality inn both breasts.    She underwent right breast biopsy on 09/03/2007 of the 9:00 lesion.  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   Breast MRI on 09/10/2007 revealed 3 lesions in the right breast measuring 2.1x3.2 cm, 2.8x2.7 cm, and 1.8x1.6 cm.  In the left breast, there was clumped enhancement centrally, in the mid portion of the breast, suspicious for malignancy.  She underwent left breast left breast MRI-guided biopsy on 10/02/2007 and 10/23/2007.  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  Per report, biopsies done of additional abnormal sites of the right breast showing invasive carcinoma (ER+/PR+ with Her2-). Right axillary lymph node was positive for metastatic disease.  She received  neoadjuvant Adriamycin and Cytoxan (AC) followed by Taxol and Carboplatin.  She had a severe Taxol infusion reaction and thus was switched to Abraxane.  On 07/14/2008, she underwent bilateral mastectomies followed by reconstructions.  Pathology in the right breast revealed a 1.7 cm grade III invasive ductal carcinoma grade III/III.   Zero of 11 lymph nodes on the right were positive for malignancy. 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 Deep margin was negative. Zero of 2 lymph nodes were positive for metastatic disease.  She received adjuvant radiation to the right breast only.  She began adjuvant Tamoxifen and Zoladex.  Per patient, she could not tolerate symptoms of joint pain.  Tamoxifen was discontinued after 1-2 months.  Zoladex was continued for approximately 1.5 years.  She presented on 03/01/2011 with headaches.  Head MRI  revealed metastatic disease with lesions in the left frontal area 2.4x1.7x1.8cm and right frontal area 2.2x1.8x1.7cm.   She underwent left frontal and right parietal craniotomies and excision of these lesions. Pathology of the left and right frontal lesions revealed metastatic adenocarcinoma. ER was >90%, PR5%, HER2 negative (Hercept Test DAKO).  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  Each lesion received 1600cGy.  On 03/09/2011, original breast tissue (09/03/2007) sent to [facility] testing.  Testing revealed ER+, PR+(borderline), and HER2/neu positive (different than initial testing).  She received Herceptin for 1 year beginning in 2013.  She then began Tykerb and Xeloda after completion of Herceptin (2014?).  Head MRI on 07/22/2013 was worrisome for disease progression in the right frontal lobe (1.0 x 1.1 x1.2cm).  The central potion of the lesion and surrounding T2/FLAIR hyperintensity had increased in size.  Head MRI on 01/21/2014 revealed the right frontal enhancing lesion had  increased in size with surrounding edema and local mass effect (measured 1.8 x 2.1 x 1.6 cm) ).  She relocated to [place] in 04/2014 and stopped taking Tykerb and Xeloda (dosing sub-therapeutic and probably ineffective).  Head MRI on 07/16/2014 noted the right anterior frontal enhancing mass was 2.5 x 2.0 cm compatible with a metastatic lesion which had increased in size and had changed configuration from previous 2.0 x 1.8 cm, formerly multilobular.  On 09/04/2014, she underwent craniotomy with resection of right frontal tumor (metastatic breast cancer).  Tumor was ER 90-100%, PR 51-60%, and HER2/neu -.  On 09/29/2014 a right cervical lymph node was positive for metastatic carcinoma consistent with breast primary.  Tumor was  ER 100%, PR 20%, and HER2.neu-.  She started tamoxifen in 09/2014.  She stopped tamoxifen in early 02/2015.   She is currently receiving care under the  care of Dr. Mike Gip at [facility].  Her most recent imaging studies in September and October 2018 showed metastatic disease with hepatic and periportal lymphadenopathy and retroperitoneal lymph nodes.  She also has extensive metastatic disease in the bone.  Received 3 cycles of Abraxane and progressed in September 2018 and currently receiving Eribulin started in October 2018.  She was transferred to [facility] on 02/04/2017 after presenting to [facility] with complaints of acute cholecystitis and jaundice.  Her evaluation included MRI of the abdomen on 02/03/2017 which showed a mild intrahepatic biliary duct distention and marked attenuation of the common bile duct likely related to metastatic disease.  Concern for cholecystitis was also noted.  She was noted to be jaundice with a bilirubin trending up to 7.9 when it was normal and 01/23/2017.  Her AST and ALT also elevated  Clinically, she is quite debilitated with excessive fatigue nausea and pain.  She denies any headaches or seizure activity.  She denies any chest pain or difficulty breathing.  She does not report any hematochezia or melena.  She does not report a neurological deficits.  She appears to be bedbound at this time.      Past Medical History:  Diagnosis Date  . Brain cancer ([place]) 2012   Met. from Breast  . Breast cancer ([place]) 2009  . Complication of anesthesia    nausea, "drops in potassium and magnesium"  . Pericardial effusion 02/05/2017  . Seizures ([place])   :  Past Surgical History:  Procedure Laterality Date  . BRAIN SURGERY  2012, 2106  . CESAREAN SECTION    . LAPAROSCOPIC BILATERAL SALPINGO OOPHERECTOMY Bilateral 05/18/2015   Procedure: LAPAROSCOPIC BILATERAL SALPINGO OOPHORECTOMY;  Surgeon: Will Bonnet, MD;  Location: [facility] ORS;  Service: Gynecology;  Laterality: Bilateral;  . MASTECTOMY Bilateral 2010  :   Current Facility-Administered Medications:  .  0.9 %  NaCl with KCl 20 mEq/ L  infusion, , Intravenous, Continuous, Cherene Altes, MD, Last Rate: 50 mL/hr at 02/08/17 0858 .  acetaminophen (TYLENOL) tablet 500 mg, 500 mg, Oral, Q6H PRN, 500 mg at 02/07/17 1704 **OR** [DISCONTINUED] acetaminophen (TYLENOL) suppository 650 mg, 650 mg, Rectal, Q6H PRN, Jani Gravel, MD .  chlorhexidine (PERIDEX) 0.12 % solution 15 mL, 15 mL, Mouth Rinse, BID, Jani Gravel, MD, 15 mL at 02/08/17 0854 .  ciprofloxacin (CIPRO) IVPB 400 mg, 400 mg, Intravenous, Q12H, Jani Gravel, MD, Last Rate: 200 mL/hr at 02/08/17 0853, 400 mg at 02/08/17 0853 .  docusate sodium (COLACE) capsule 100 mg, 100 mg, Oral, BID, Barton Dubois, MD .  enoxaparin (LOVENOX) injection 40 mg, 40 mg, Subcutaneous, Q24H, Jani Gravel, MD, 40 mg at 02/07/17 2042 .  feeding supplement (BOOST / RESOURCE BREEZE) liquid 1 Container, 1 Container, Oral, TID BM, Cherene Altes, MD, 1 Container at 02/07/17 2042 .  MEDLINE mouth rinse, 15 mL, Mouth Rinse, q12n4p, Jani Gravel, MD, 15 mL at 02/07/17 1517 .  metroNIDAZOLE (FLAGYL) IVPB 500 mg, 500 mg, Intravenous, Q8H, Jani Gravel, MD, Last Rate: 100 mL/hr at 02/08/17 0853, 500 mg at 02/08/17 0853 .  morphine 2 MG/ML injection 1 mg, 1 mg, Intravenous, Q3H PRN, Barton Dubois, MD, 1 mg at 02/07/17 1937 .  pantoprazole (PROTONIX) EC tablet 40 mg, 40 mg, Oral, Daily, Jani Gravel, MD, 40 mg  at 02/07/17 0856 .  polyethylene glycol (MIRALAX / GLYCOLAX) packet 17 g, 17 g, Oral, Daily, Barton Dubois, MD .  promethazine (PHENERGAN) injection 12.5-25 mg, 12.5-25 mg, Intravenous, Q6H PRN, Cherene Altes, MD, 25 mg at 02/08/17 0852 .  simethicone (MYLICON) chewable tablet 80 mg, 80 mg, Oral, Q4H PRN, Jani Gravel, MD .  sodium chloride flush (NS) 0.9 % injection 10-40 mL, 10-40 mL, Intracatheter, PRN, Cherene Altes, MD:  Allergies  Allergen Reactions  . Taxol [Paclitaxel] Anaphylaxis  . Penicillins Swelling    Has patient had a PCN reaction causing immediate rash,  facial/tongue/throat swelling, SOB or lightheadedness with hypotension: No Has patient had a PCN reaction causing severe rash involving mucus membranes or skin necrosis: No Has patient had a PCN reaction that required hospitalization: Yes Has patient had a PCN reaction occurring within the last 10 years: No If all of the above answers are "NO", then may proceed with Cephalosporin use.  **Localized swelling in the arm**   . Aspirin Swelling  . Zofran [Ondansetron Hcl] Nausea And Vomiting  :  Family History  Problem Relation Age of Onset  . Cancer Paternal Aunt   . Cancer Paternal Uncle   . Cancer Paternal Grandfather   . Hypertension Brother   . Diabetes Paternal Grandmother   . CAD Father   :  Social History   Socioeconomic History  . Marital status: Married    Spouse name: Not on file  . Number of children: Not on file  . Years of education: Not on file  . Highest education level: Not on file  Social Needs  . Financial resource strain: Not on file  . Food insecurity - worry: Not on file  . Food insecurity - inability: Not on file  . Transportation needs - medical: Not on file  . Transportation needs - non-medical: Not on file  Occupational History  . Not on file  Tobacco Use  . Smoking status: Never Smoker  . Smokeless tobacco: Never Used  Substance and Sexual Activity  . Alcohol use: No  . Drug use: No  . Sexual activity: Not on file  Other Topics Concern  . Not on file  Social History Narrative  . Not on file  :  Pertinent items are noted in HPI.  Exam: Blood pressure 112/79, pulse (!) 110, temperature 98.8 F (37.1 C), temperature source Oral, resp. rate 18, height _0  (1.6 m), weight 138 lb 14.4 oz (63 kg), last menstrual period 03/19/2015, SpO2 99 %. General appearance: alert and cooperative Throat: lips, mucosa, and tongue normal; teeth and gums normal Neck: no adenopathy Resp: clear to auscultation bilaterally Chest wall: no tenderness Cardio:  regular rate and rhythm, S1, S2 normal, no murmur, click, rub or gallop Extremities: extremities normal, atraumatic, no cyanosis or edema Pulses: 2+ and symmetric Skin: Skin color, texture, turgor normal. No rashes or lesions Lymph nodes: Cervical, supraclavicular, and axillary nodes normal.  Recent Labs    02/06/17 0530 02/07/17 0459  WBC 14.3* 14.2*  HGB 9.2* 9.5*  HCT 28.5* 28.5*  PLT 150 162   Recent Labs    02/06/17 1622 02/07/17 0459  NA 135 134*  K 3.0* 2.9*  CL 107 106  CO2 21* 21*  GLUCOSE 134* 99  BUN <5* <5*  CREATININE 0.72 0.68  CALCIUM 6.9* 6.8*      Ct Angio Chest Pe W Or Wo Contrast  Result Date: 02/05/2017 CLINICAL DATA:  Metastatic breast cancer. Positive D-dimer. Shortness of breath.  EXAM: CT ANGIOGRAPHY CHEST WITH CONTRAST TECHNIQUE: Multidetector CT imaging of the chest was performed using the standard protocol during bolus administration of intravenous contrast. Multiplanar CT image reconstructions and MIPs were obtained to evaluate the vascular anatomy. CONTRAST:  76m ISOVUE-370 IOPAMIDOL (ISOVUE-370) INJECTION 76% COMPARISON:  11/29/2016 FINDINGS: Cardiovascular: No filling defects in the pulmonary arteries to suggest pulmonary emboli. Small pericardial effusion, similar to prior study. Aorta is normal caliber. Mediastinum/Nodes: Small scattered mediastinal lymph nodes, similar prior study. No hilar or axillary adenopathy. Lungs/Pleura: Large right pleural effusion with compressive atelectasis in the right lower lobe. Biapical scarring. Nodular densities in the right upper lobe are again noted, stable. Linear subsegmental atelectasis at the left base. Upper Abdomen: Extensive metastatic disease seen throughout the liver, better seen on prior abdominal CT. Small stable nodule in the left adrenal gland. Musculoskeletal: Bilateral breast implants noted. Medial left Port-A-Cath scratched at medial left chest wall Port-A-Cath noted, unchanged. Extensive bony  metastatic disease again noted throughout the thoracic spine and lower cervical spine as well as multiple bilateral ribs, unchanged. Review of the MIP images confirms the above findings. IMPRESSION: No evidence of pulmonary embolus. Large right pleural effusion, new since prior study. Compressive atelectasis in the right lower lobe. Trace left pleural effusion. Stable small pericardial effusion. Stable borderline sized mediastinal lymph nodes. Nodular areas in the right upper lobe/ apex are stable. Biapical scarring. Extensive metastatic disease throughout the liver and bones, unchanged. Stable left adrenal nodule. Electronically Signed   By: KRolm BaptiseM.D.   On: 02/05/2017 11:39   Mr Abdomen With Mrcp W Contrast  Addendum Date: 02/03/2017   ADDENDUM REPORT: 02/03/2017 18:28 ADDENDUM: As mentioned in the body of the report, there is mild left hydroureteronephrosis of indeterminate etiology, but new since 12/22/2016. Electronically Signed   By: EMisty StanleyM.D.   On: 02/03/2017 18:28   Result Date: 02/03/2017 CLINICAL DATA:  Breast cancer with metastatic disease to the liver. Abdominal pain and fever. Ultrasound earlier today suggested acute cholecystitis. EXAM: MRI ABDOMEN WITHOUT CONTRAST  (INCLUDING MRCP) TECHNIQUE: Multiplanar multisequence MR imaging of the abdomen was performed. Heavily T2-weighted images of the biliary and pancreatic ducts were obtained, and three-dimensional MRCP images were rendered by post processing. COMPARISON:  Ultrasound exam earlier today.  Abdomen CT 12/22/2016 FINDINGS: Lower chest:  Small right pleural effusion. Hepatobiliary: Multiple liver mets are identified with 1 of the more dominant lesions measuring 3.4 cm in the posterior right hepatic lobe. There is an area confluent metastatic disease in the central liver near the hepatic duct confluence measuring 3.9 x 5.4 cm. MRCP images show mild intrahepatic biliary duct dilatation with marked attenuation of the bile duct  in the region of the porta hepatis. Common duct cannot be traced through the porta hepatis into the head of the pancreas and common duct inclusion in the hepatoduodenal ligament is also concern, but this is not well evaluated given motion artifact. 7 mm gallstone evident with apparent gallbladder wall thickening and/ or pericholecystic edema Pancreas: Not well seen due to motion artifact from patient difficulty with reproducible breath holding. No dilatation the main pancreatic duct. Spleen: No splenomegaly. No focal mass lesion. Adrenals/Urinary Tract: No adrenal nodule or mass. Right kidney unremarkable. There is mild left hydroureteronephrosis although full extent of the distended left ureter not visualized. Stomach/Bowel: Stomach is nondistended. No gastric wall thickening. No evidence of outlet obstruction. Duodenum is normally positioned as is the ligament of Treitz. No small bowel or colonic dilatation within the visualized abdomen.  Vascular/Lymphatic: No abdominal aortic aneurysm. Probable lymphadenopathy in the porta hepatis. Other: Small volume intraperitoneal free fluid evident. Musculoskeletal: Multiple foci of abnormal marrow enhancement compatible with bony metastatic disease. IMPRESSION: 1. Mild intrahepatic biliary duct distension with marked attenuation of the common duct in the region of the porta hepatis, likely due to metastatic disease in the central liver with metastatic lymphadenopathy in the porta hepatis also a consideration. The distal common bile duct is not well seen as study is fairly motion degraded and an occlusion of the common bile duct in the porta hepatis towards the head of pancreas is also concern. No evidence for choledocholithiasis. 2. Cholelithiasis with edema or fluid in/around the gallbladder wall. As noted on ultrasound exam earlier today, cholecystitis is a concern. 3. Bulky metastatic disease in the liver as seen on recent CT scan. Electronically Signed: By: Misty Stanley  M.D. On: 02/03/2017 18:23      Assessment and Plan:   46 year old woman with the following issues:  1.  Metastatic breast cancer with extensive history outlined above.  She has metastatic disease to the bone and liver with retroperitoneal adenopathy.  She is currently receiving palliative chemotherapy under the care of [facility] in the form of Eribulin.   It is unclear whether this medication is offering any palliation in her disease at this time given the fact that it was recently started.  Clearly, she has an incurable malignancy and carries a poor prognosis.  Her tumor marker did show substantial improvement between October 10 and January 16, 2017.  Although she is clinically declining likely related to her elevation in her liver function test.  2.  Jaundice: Etiology is related to intra-and extrahepatic biliary obstruction related to malignancy.  Chemotherapy will not dramatically improve this obstruction as evident by the progression of her very obstruction despite systemic therapy.   Radiation therapy has no role at this point given the extensive nature of her metastasis and level of obstruction.  If there is a specific area of obstruction that could be relieved by percutaneous drainage that could be performed by interventional radiology, then I would encourage proceeding with that.  This might relieve her obstruction and improve her bilirubin for palliative purposes to allow her to continue to receive palliative chemotherapy.  If there is no specific intervention that can be done, then certainly additional chemotherapy or radiation will not do it moving forward.  In this case, I would recommend proceeding with Palliative Medicine  evaluation to establish goals of care.  3.  Prognosis: Poor at this time especially if her jaundice cannot be relieved.  If her jaundice can be relieved, that will potentially offer her some palliation and might extend her overall survival.

## 2017-02-09 ENCOUNTER — Inpatient Hospital Stay (HOSPITAL_COMMUNITY): Payer: Medicare HMO

## 2017-02-09 DIAGNOSIS — Z515 Encounter for palliative care: Secondary | ICD-10-CM

## 2017-02-09 DIAGNOSIS — Z7189 Other specified counseling: Secondary | ICD-10-CM

## 2017-02-09 DIAGNOSIS — K831 Obstruction of bile duct: Secondary | ICD-10-CM

## 2017-02-09 LAB — COMPREHENSIVE METABOLIC PANEL
ALK PHOS: 328 U/L — AB (ref 38–126)
ALT: 62 U/L — AB (ref 14–54)
AST: 97 U/L — AB (ref 15–41)
Albumin: 2.4 g/dL — ABNORMAL LOW (ref 3.5–5.0)
Anion gap: 8 (ref 5–15)
BILIRUBIN TOTAL: 8.4 mg/dL — AB (ref 0.3–1.2)
CALCIUM: 6.7 mg/dL — AB (ref 8.9–10.3)
CO2: 22 mmol/L (ref 22–32)
CREATININE: 0.73 mg/dL (ref 0.44–1.00)
Chloride: 105 mmol/L (ref 101–111)
GFR calc Af Amer: >60 mL/min (ref >60)
Glucose, Bld: 111 mg/dL — ABNORMAL HIGH (ref 65–99)
Potassium: 3 mmol/L — ABNORMAL LOW (ref 3.5–5.1)
Sodium: 135 mmol/L (ref 135–145)
TOTAL PROTEIN: 5.3 g/dL — AB (ref 6.5–8.1)

## 2017-02-09 LAB — CBC
HEMATOCRIT: 27.2 % — AB (ref 36.0–46.0)
Hemoglobin: 9 g/dL — ABNORMAL LOW (ref 12.0–15.0)
MCH: 30.3 pg (ref 26.0–34.0)
MCHC: 33.1 g/dL (ref 30.0–36.0)
MCV: 91.6 fL (ref 78.0–100.0)
Platelets: 177 10*3/uL (ref 150–400)
RBC: 2.97 MIL/uL — AB (ref 3.87–5.11)
RDW: 23.5 % — AB (ref 11.5–15.5)
WBC: 16.2 10*3/uL — AB (ref 4.0–10.5)

## 2017-02-09 MED ORDER — SODIUM CHLORIDE 0.9 % IV SOLN
1.0000 g | Freq: Once | INTRAVENOUS | Status: AC
  Administered 2017-02-09: 1 g via INTRAVENOUS
  Filled 2017-02-09: qty 10

## 2017-02-09 MED ORDER — POTASSIUM CHLORIDE 10 MEQ/100ML IV SOLN
10.0000 meq | INTRAVENOUS | Status: AC
  Administered 2017-02-09 (×3): 10 meq via INTRAVENOUS
  Filled 2017-02-09 (×2): qty 100

## 2017-02-09 MED ORDER — MORPHINE SULFATE (PF) 2 MG/ML IV SOLN
2.0000 mg | INTRAVENOUS | Status: DC | PRN
  Administered 2017-02-09 – 2017-02-10 (×2): 2 mg via INTRAVENOUS
  Filled 2017-02-09 (×2): qty 1

## 2017-02-09 NOTE — Progress Notes (Signed)
Patient ID: Melinda Tanner, female   DOB: January 12, 1971, 46 y.o.   MRN: [id number] IR has been consulted about a percutaneous biliary or gallbladder drain.  I reviewed the patient's RUQ ultrasound from today.  There is no gallbladder or biliary dilatation.  Suspect her elevated bilirubin is related to metastatic disease.  I do not think a percutaneous drain will help this patient.  No plans for an IR procedure.

## 2017-02-09 NOTE — Consult Note (Signed)
Consultation Note Date: 02/09/2017   Patient Name: Melinda Tanner  DOB: 09-03-1970  MRN: [id number]  Age / Sex: 46 y.o., female  PCP: Alene Mires Elyse Jarvis, MD Referring Physician: Barton Dubois, MD  Reason for Consultation: Establishing goals of care, Non pain symptom management, Pain control and Psychosocial/spiritual support  HPI/Patient Profile: 46 y.o. 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  She initially presented to [facility] regional emergency room with persistent nausea and vomiting.  In the emergency room a right upper quadrant ultrasound noted gallbladder wall thickening and a 1.2 cm gallstone suggesting acute cholecystitis with multiple metastatic lesions within the liver.  MRCP noted mild intrahepatic biliary duct distention with marketed attenuation of the common bile duct, likely due to metastatic disease in the central liver with metastatic lymphadenopathy in the porta hepatis.  The distal common bile duct is not well defined.  An occlusion of the common bile duct towards the head of the pancreas is also a concern.  She was transferred to [facility] for evaluations by GI as well as surgery.  General surgery was consulted and they are not anticipating being able to perform a cholecystectomy.  GI services declined further ERCP.  IR has been consulted for the potential of a percutaneous drain.  Patient was seen by Dr. Alen Blew from oncology, who recommended if percutaneous drain could be placed this might give patient a little more time.  Per oncology's note, additional chemo would not relieve ? common bile duct obstruction, or jaundice. FU [place] 02/09/17, there is no gallbladder or biliary dilatation; suspected increase in bilirubin 2/2 metastatic disease  Consult ordered for goals  of care.  Initial conversations assisted with [employer] interpreter services and later with husband Clinical Assessment and Goals of Care: Met with patient, her son Melinda Tanner ( age 60), daughter ( age 45), as well as spouse for goals of care discussion  Patient is still able to speak for herself in terms of determining goals of care.  She does understand a little [place] but interpreter services are needed for better understanding.  Presented hospice as option going forward. Husband appeared taken aback by this information and stated "we will talk about this as a family". I shared my concerns that without hospice support in the home it would be difficult to mange her symptoms as PO meds are making her nauseous.    SUMMARY OF RECOMMENDATIONS   Full code. I did not feel it was appropriate to speak further about code status given husband's reluctance to talk about hospice and EOL I did give him telephone number for [facility] and how to access services Perhaps another visit with oncology now that we know via [place] that there is no obstruction could speak to family's other questions. Husband is asking for this input as well Will FU with pt/family on 11/24 if they are amenable to this Code Status/Advance Care Planning:  Full code  Symptom Management:   Pain: Patient reports feeling nauseated whenever she takes pills.  Would recommend as many meds given via IV/SQ route as possible at this point.  Would recommend morphine IV (her creatinine is 0.73) 1-4 mg every 2 hours as needed for pain.  She describes her pain as emanating from the left lower quadrant radiating to her back.  She states it comes and goes.  She also states that she needed 3 as needed doses of pain medicine on  02/08/2017. She also could try MS04 concentrate 20 mg/ml to see if she can tolerate this as opposed to a pill, at 5-10 mg q2 PRN  Nausea: Continues to verbalize nausea made worse by oral medications, p.o. intake.   Continue with Reglan.  Also Decadron 4-8 mg IV daily could reduce inflammation specifically around liver capsule pain.  Phenergan as needed also available  Constipation: No bowel movement in 2 days which should additionally be contributing to pain as well as nausea.  She is currently on MiraLAX daily but given the amount of liquid required with this may not be able to tolerate.  Recommend senna 2-4 tabs daily.  Patient may need a fleets enema  Palliative Prophylaxis:   Aspiration, Bowel Regimen, Delirium Protocol, Frequent Pain Assessment, Oral Care and Turn Reposition  Additional Recommendations (Limitations, Scope, Preferences):  Full Scope Treatment  Psycho-social/Spiritual:   Desire for further Chaplaincy support:no  Additional Recommendations: Grief/Bereavement Support  Prognosis:   Unable to determine  Discharge Planning: To Be Determined      Primary Diagnoses: Present on Admission: . Metastatic breast cancer ([place]) . Cholecystitis   I have reviewed the medical record, interviewed the patient and family, and examined the patient. The following aspects are pertinent.  Past Medical History:  Diagnosis Date  . Brain cancer ([place]) 2012   Met. from Breast  . Breast cancer ([place]) 2009  . Complication of anesthesia    nausea, "drops in potassium and magnesium"  . Pericardial effusion 02/05/2017  . Seizures ([place])    Social History   Socioeconomic History  . Marital status: Married    Spouse name: None  . Number of children: None  . Years of education: None  . Highest education level: None  Social Needs  . Financial resource strain: None  . Food insecurity - worry: None  . Food insecurity - inability: None  . Transportation needs - medical: None  . Transportation needs - non-medical: None  Occupational History  . None  Tobacco Use  . Smoking status: Never Smoker  . Smokeless tobacco: Never Used  Substance and Sexual Activity  . Alcohol use: No  . Drug use:  No  . Sexual activity: None  Other Topics Concern  . None  Social History Narrative  . None   Family History  Problem Relation Age of Onset  . Cancer Paternal Aunt   . Cancer Paternal Uncle   . Cancer Paternal Grandfather   . Hypertension Brother   . Diabetes Paternal Grandmother   . CAD Father    Scheduled Meds: . chlorhexidine  15 mL Mouth Rinse BID  . docusate sodium  100 mg Oral BID  . enoxaparin (LOVENOX) injection  40 mg Subcutaneous Q24H  . feeding supplement  1 Container Oral TID BM  . mouth rinse  15 mL Mouth Rinse q12n4p  . metoCLOPramide (REGLAN) injection  10 mg Intravenous Q8H  . pantoprazole (PROTONIX) IV  40 mg Intravenous Q24H  . polyethylene glycol  17 g Oral Daily  Continuous Infusions: . 0.9 % NaCl with KCl 20 mEq / L 50 mL/hr at 02/09/17 0823  . calcium gluconate 1 g (02/09/17 0931)  . ciprofloxacin Stopped (02/09/17 0930)  . metronidazole Stopped (02/09/17 0930)  . potassium chloride 10 mEq (02/09/17 0931)   PRN Meds:.acetaminophen **OR** [DISCONTINUED] acetaminophen, LORazepam, morphine injection, promethazine, simethicone, sodium chloride flush Medications Prior to Admission:  Prior to Admission medications   Medication Sig Start Date End Date Taking? Authorizing Provider  CALCIUM-VITAMIN D PO Take 1 tablet by mouth daily.   Yes [provider]  Cyanocobalamin (VITAMIN B-12 PO) Take by mouth.   Yes [provider]  ibuprofen (ADVIL,MOTRIN) 800 MG tablet Take 800 mg by mouth every 8 (eight) hours as needed (Takes 1/2 tablet prn).   Yes [provider]  LORazepam (ATIVAN) 0.5 MG tablet 1/2-1 tab (0.40m to 0.5110m every 6 hours PRN nausea 12/27/16  Yes GrKaren KitchensNP  megestrol (MEGACE) 400 MG/10ML suspension Take 5 mLs (200 mg total) by mouth daily. 01/16/17  Yes GrKaren KitchensNP  Multiple Vitamins-Minerals (CENTRUM ADULTS PO) Take 1 tablet by mouth daily.   Yes [provider]  omeprazole (PRILOSEC) 20 MG  capsule Take 20 mg by mouth daily. 09/07/15  Yes [provider]  pantoprazole (PROTONIX) 20 MG tablet Take 1 tablet by mouth daily 09/29/16  Yes Cor[place], MeDrue SecondMD  promethazine (PHENERGAN) 12.5 MG tablet Take 1 tablet (12.5 mg total) by mouth every 6 (six) hours as needed for nausea or vomiting. 01/16/17  Yes GrKaren KitchensNP  dexamethasone (DECADRON) 4 MG tablet Take 1 tablet (4 mg total) by mouth daily. Patient not taking: Reported on 02/03/2017 11/02/16   ChNoreene FilbertMD  docusate sodium (COLACE) 100 MG capsule Take 1 tablet once or twice daily as needed for constipation while taking narcotic pain medicine Patient not taking: Reported on 12/25/2016 06/07/16   FoHinda KehrMD  potassium chloride SA (K-DUR,KLOR-CON) 20 MEQ tablet Take 1 tablet (20 mEq total) by mouth daily. Patient not taking: Reported on 01/16/2017 01/02/17   GrKaren KitchensNP  traMADol (ULTRAM) 50 MG tablet Take 1 tablet (50 mg total) by mouth every 12 (twelve) hours as needed. Patient not taking: Reported on 01/16/2017 12/27/16   GrKaren KitchensNP   Allergies  Allergen Reactions  . Taxol [Paclitaxel] Anaphylaxis  . Penicillins Swelling    Has patient had a PCN reaction causing immediate rash, facial/tongue/throat swelling, SOB or lightheadedness with hypotension: No Has patient had a PCN reaction causing severe rash involving mucus membranes or skin necrosis: No Has patient had a PCN reaction that required hospitalization: Yes Has patient had a PCN reaction occurring within the last 10 years: No If all of the above answers are "NO", then may proceed with Cephalosporin use.  **Localized swelling in the arm**   . Aspirin Swelling  . Zofran [Ondansetron Hcl] Nausea And Vomiting   Review of Systems  Unable to perform ROS: Other    Physical Exam  Constitutional: She is oriented to person, place, and time. She appears well-developed and well-nourished.  Acutely ill appearing middle-aged Hispanic  female Appears very weak, tired  HENT:  Head: Normocephalic and atraumatic.  Neck: Normal range of motion.  Cardiovascular: Normal rate.  Pulmonary/Chest: Effort normal.  Abdominal: She exhibits distension.  Musculoskeletal: Normal range of motion.  Neurological: She is alert and oriented to person, place, and time.  Skin: Skin is warm and dry.  Jaundiced  Psychiatric:  Unable to test  Nursing note and vitals reviewed.   Vital Signs: BP 121/87 (BP Location: Right Arm)   Pulse (!) 107   Temp 98.7 F (37.1 C) (Oral)   Resp 18   Ht _0  (1.6 m)   Wt 63 kg (138 lb 14.4 oz)   LMP 03/19/2015 Comment: Partial Hyst  SpO2 98%   BMI 24.61 kg/m  Pain Assessment: 0-10   Pain Score: 8    SpO2: SpO2: 98 % O2 Device:SpO2: 98 % O2 Flow Rate: .   IO: Intake/output summary:   Intake/Output Summary (Last 24 hours) at 02/09/2017 1019 Last data filed at 02/09/2017 0600 Gross per 24 hour  Intake 3222.5 ml  Output -  Net 3222.5 ml    LBM: Last BM Date: 02/07/17 Baseline Weight: Weight: 60.6 kg (133 lb 9.6 oz) Most recent weight: Weight: 63 kg (138 lb 14.4 oz)     Palliative Assessment/Data:   Flowsheet Rows     Most Recent Value  Intake Tab  Referral Department  Hospitalist  Unit at Time of Referral  Med/Surg Unit  Palliative Care Primary Diagnosis  Cancer  Date Notified  02/08/17  Reason for referral  Clarify Goals of Care, Pain, Non-pain Symptom  Date of Admission  02/01/17  Date first seen by Palliative Care  02/09/17  # of days Palliative referral response time  1 Day(s)  # of days IP prior to Palliative referral  7  Clinical Assessment  Palliative Performance Scale Score  40%  Pain Max last 24 hours  Not able to report  Pain Min Last 24 hours  Not able to report  Dyspnea Max Last 24 Hours  Not able to report  Dyspnea Min Last 24 hours  Not able to report  Nausea Max Last 24 Hours  Not able to report  Nausea Min Last 24 Hours  Not able to report  Anxiety Max  Last 24 Hours  Not able to report  Anxiety Min Last 24 Hours  Not able to report  Other Max Last 24 Hours  Not able to report  Psychosocial & Spiritual Assessment  Palliative Care Outcomes  Patient/Family meeting held?  Yes  Who was at the meeting?  pt, husband, daughter and son,  interpretor service  Palliative Care follow-up planned  Yes, Facility      Time In: 7591 Time Out: 1450 Time Total: 65 min Greater than 50%  of this time was spent counseling and coordinating care related to the above assessment and plan. Staffed with Dr. Dyann Kief  Signed by: Dory Horn, NP   Please contact Palliative Medicine Team phone at [phone] for questions and concerns.  For individual provider: See Shea Evans

## 2017-02-09 NOTE — Progress Notes (Signed)
Plan to repeat TTE on Monday 02/12/17 (ordered). If stable pericardial effusion, no further intervention.    Tami Lin Wally Behan, PA-C 02/09/2017, 10:52 AM [phone]

## 2017-02-09 NOTE — Progress Notes (Signed)
Pt off floor for [place] of Abdomen.

## 2017-02-09 NOTE — Progress Notes (Signed)
IR following for request for percutaneous cholecystostomy placement for increased bilirubin of 8.4.  Oncology has seen patient and recommends placement.  Reviewed imaging and case with Dr. Anselm Pancoast who again notes the absense of biliary dilation or obvious obstruction, but large degree of tumor/disease burden. It is possible that tube placement may have little to no effect.  Appreciate input from multiple disciplines as risk of procedures, long-term implications, and patient's wishes should be considered.  Dr. Anselm Pancoast recommends repeat US Abdomen to reassess RUQ.  Will continue discussion re: tube placement once results available.   Patient would likely benefit from Palliative care discussion as tube would likely have to stay long-term.  Brynda Greathouse, MS RD PA-C 11:14 AM

## 2017-02-09 NOTE — Progress Notes (Signed)
Melinda Tanner  ZOX:[id number] DOB: [birth year] DOA: 02/04/2017 PCP: Theotis Burrow, MD    Brief Narrative:  46 y.o. female w/ metastatic breast cancer to liver brain and bone, and a seizure d/o who presented to [facility] ED w/ c/o persisting nausea and vomiting.  In the ED a RUQ [place] noted gallbladder wall thickening and a 1.2 cm gallstone suggesting acute cholecystitis, w/ multiple metastatic lesions within the liver.  MRCP noted mild intrahepatic biliary duct distension with marked attenuation of the common duct in the region of the porta hepatis, likely due to metastatic disease in the central liver with metastatic lymphadenopathy in the porta hepatis also a consideration. The distal common bile duct was not well seen and an occlusion of the common bile duct in the porta hepatis towards the head of pancreas was also a concern.  The pt was transferred to [facility] for evaluations by GI and Surgery.    Significant Events: 11/18 presented to [facility] ED - transferred to [facility] for evaluation by GI and CCS.   Subjective: Slightly more comfortable, still having nausea, RUQ pain and vomiting. Positive jaundice and icterus. Bili even higher today.  Assessment & Plan: Acute cholecystitis -Gen Surgery not anticipating to performed any intervention  -will discontinue abx's -GI decline ERCP -IR not considering that percutaneous drainage will offer any help or assistance -oncology service also unable to offer anything else at this point -will focus on symptoms management and comfort care -will arrange hospice at home to discharge on 11/24  Obstructive jaundice/concern for cholangitis -Likely due to metastatic breast CA/obstructing metastatic mass  -GI does not feel an ERCP will benefit her and has suggested percutaneous drainage of the liver as well as GB, w/ 2 possible drains needing to be place. IR consulted, but as mentioned above has decline intervention at this point and  recommended oncology evaluation. -Following oncology recommendations there is no role of any active chemotherapy or radiation at this moment.  They recommend to reassess the potentiality of any kind of drain that will allow a decrease in patient's bilirubin and drainage of overall liver congestion.   -Palliative care has been consulted and after long discussion that we have with them; plan is for DNR, comfort care and home hospice. -at this point will stop antibiotics -continue PRN antiemetics and analgesics -comfort management and symptoms control -plan is to arrange home hospice and discharge on 11/24.   Constipation -Patient continue to have small BM's today -Continue MiraLAX and Colace.    Trop elevation -no CP, no SOB -no ischemic changes on EKG -mild elevation in setting of metastatic disease and large pericardial effusion. -Continue to manage symptoms only; full comfort care   Large circumferential pericardial effusion w/o tamponade.  -Cardiology has evaluated patient and given the fact she is no symptomatic at this time no recommendations for pericardial window or further intervention needed. -will focus on full comfort only   Severe Hypokalemia -associated with poor PO -continue maintenance IVF overnight; in anticipation to discharge home with hospice on 11/24 -no further blood work at this moment.  Metastatic breast cancer -Diagnosed 2009 - s/p B mastectomy, chemo, and XRT -with metastatic disease to bone and liver; patient doing poorly and already on salvage chemotherapy.  -Discussing with oncology service there is no role for further radiation and has recommended palliative care involvement.   -patient has decided to pursuit comfort care and symptoms management   Severe malnutrition in context of chronic illness -appreciate Nutritional service rec's  and inputs  -continue breeze/boost  -Full liquid diet as comfort feeding   DVT prophylaxis: lovenox Code Status:  DNR Family Communication: spoke with husband, sister, dad, mother son and daughter at bedside.  Disposition Plan: after long discussion and given the scenario of not possibility for intervention is amenable; patient is in agreement to pursuit comfort care, symptomatic management and hospice at this point. Family supportive and in agreement with her decision.   Consultants:  Gen Surgery  GI IR Oncology Palliative care   Antimicrobials:  Cipro 11/17 >11/23 Flagyl 11/17 > 11/23  Objective: Blood pressure 124/86, pulse (!) 107, temperature 98.7 F (37.1 C), temperature source Oral, resp. rate 18, height 5\' 3"  (1.6 m), weight 63 kg (138 lb 14.4 oz), last menstrual period 03/19/2015, SpO2 99 %.  Intake/Output Summary (Last 24 hours) at 02/09/2017 1855 Last data filed at 02/09/2017 1807 Gross per 24 hour  Intake 2422.5 ml  Output -  Net 2422.5 ml   Filed Weights   02/06/17 0500 02/06/17 1700 02/07/17 0300  Weight: 62.4 kg (137 lb 9.1 oz) 63.1 kg (139 lb 1.8 oz) 63 kg (138 lb 14.4 oz)    Examination: General: afebrile, no CP, no SOB. Patient less anxious. Still complaining of RUQ pain, nausea and vomiting (even last two are somewhat better with current medication regimen).  Lungs: no wheezing, no crackles, good air movement and good O2 sat on RA. Cardiovascular: mil sinus tachycardia, no rubs, no gallops, no JVD.    Abdomen: soft, no guarding; positive RUQ pain on palpation. Positive BS. Had BM overnight. Extremities: no cyanosis, no clubbing, no erythema. Trace edema bilaterally.  CBC: Recent Labs  Lab 02/03/17 1139 02/04/17 1618 02/05/17 0600 02/06/17 0530 02/07/17 0459 02/09/17 0345  WBC 24.1* 23.7* 19.3* 14.3* 14.2* 16.2*  NEUTROABS 21.9* 22.8*  --   --   --   --   HGB 11.0* 10.6* 9.0* 9.2* 9.5* 9.0*  HCT 33.1* 32.5* 28.1* 28.5* 28.5* 27.2*  MCV 93.0 94.7 93.7 92.8 91.3 91.6  PLT 174 151 136* 150 162 194   Basic Metabolic Panel: Recent Labs  Lab 02/04/17 2250  02/05/17 0600 02/06/17 0530 02/06/17 1622 02/07/17 0459 02/09/17 0345  NA  --  136 135 135 134* 135  K  --  3.7 2.7* 3.0* 2.9* 3.0*  CL  --  109 107 107 106 105  CO2  --  17* 19* 21* 21* 22  GLUCOSE  --  76 100* 134* 99 111*  BUN  --  <5* <5* <5* <5* <5*  CREATININE  --  0.83 0.81 0.72 0.68 0.73  CALCIUM  --  6.5* 6.7* 6.9* 6.8* 6.7*  MG 1.6*  --   --   --  2.0  --    GFR: Estimated Creatinine Clearance: 80.2 mL/min (by C-G formula based on SCr of 0.73 mg/dL).  Liver Function Tests: Recent Labs  Lab 02/04/17 1618 02/05/17 0600 02/06/17 0530 02/07/17 0459 02/09/17 0345  AST 164* 144* 144* 125* 97*  ALT 111* 94* 101* 90* 62*  ALKPHOS 387* 360* 381* 388* 328*  BILITOT 6.0* 6.2* 7.9* 7.9* 8.4*  PROT 6.2* 5.1* 5.3* 4.8* 5.3*  ALBUMIN 2.9* 2.4* 2.5* 2.5* 2.4*    Recent Labs  Lab 02/06/17 0550  AMMONIA 49*    Coagulation Profile: Recent Labs  Lab 02/06/17 0530  INR 1.32    Cardiac Enzymes: Recent Labs  Lab 02/03/17 1139 02/04/17 2250 02/05/17 0600  TROPONINI 0.05* <0.03 <0.03    Recent Results (  from the past 240 hour(s))  Urine culture     Status: None   Collection Time: 02/03/17 11:39 AM  Result Value Ref Range Status   Specimen Description URINE, RANDOM  Final   Special Requests NONE  Final   Culture   Final    NO GROWTH Performed at [facility], [address] [place], [place] [address]    Report Status 02/05/2017 FINAL  Final  Culture, blood (routine x 2)     Status: None   Collection Time: 02/03/17 12:45 PM  Result Value Ref Range Status   Specimen Description BLOOD BLOOD RIGHT WRIST  Final   Special Requests   Final    BOTTLES DRAWN AEROBIC AND ANAEROBIC Blood Culture results may not be optimal due to an inadequate volume of blood received in culture bottles   Culture NO GROWTH 5 DAYS  Final   Report Status 02/08/2017 FINAL  Final  Culture, blood (routine x 2)     Status: None   Collection Time: 02/03/17  3:48 PM  Result Value Ref  Range Status   Specimen Description BLOOD BLOOD RIGHT WRIST  Final   Special Requests   Final    BOTTLES DRAWN AEROBIC AND ANAEROBIC Blood Culture adequate volume   Culture NO GROWTH 5 DAYS  Final   Report Status 02/08/2017 FINAL  Final  MRSA PCR Screening     Status: None   Collection Time: 02/04/17  7:34 PM  Result Value Ref Range Status   MRSA by PCR NEGATIVE NEGATIVE Final    Comment:        The GeneXpert MRSA Assay (FDA approved for NASAL specimens only), is one component of a comprehensive MRSA colonization surveillance program. It is not intended to diagnose MRSA infection nor to guide or monitor treatment for MRSA infections.      Scheduled Meds: . chlorhexidine  15 mL Mouth Rinse BID  . docusate sodium  100 mg Oral BID  . enoxaparin (LOVENOX) injection  40 mg Subcutaneous Q24H  . feeding supplement  1 Container Oral TID BM  . mouth rinse  15 mL Mouth Rinse q12n4p  . metoCLOPramide (REGLAN) injection  10 mg Intravenous Q8H  . pantoprazole (PROTONIX) IV  40 mg Intravenous Q24H  . polyethylene glycol  17 g Oral Daily     LOS: 5 days   Barton Dubois MD [facility] [phone]  If 7PM-7AM, please contact night-coverage [email or web address] Password [facility] 02/09/2017, 6:55 PM

## 2017-02-10 DIAGNOSIS — I313 Pericardial effusion (noninflammatory): Secondary | ICD-10-CM | POA: Diagnosis not present

## 2017-02-10 DIAGNOSIS — K7689 Other specified diseases of liver: Secondary | ICD-10-CM | POA: Diagnosis not present

## 2017-02-10 DIAGNOSIS — C7931 Secondary malignant neoplasm of brain: Secondary | ICD-10-CM

## 2017-02-10 DIAGNOSIS — Z7189 Other specified counseling: Secondary | ICD-10-CM | POA: Diagnosis not present

## 2017-02-10 DIAGNOSIS — C50919 Malignant neoplasm of unspecified site of unspecified female breast: Secondary | ICD-10-CM | POA: Diagnosis not present

## 2017-02-10 DIAGNOSIS — C7951 Secondary malignant neoplasm of bone: Secondary | ICD-10-CM

## 2017-02-10 DIAGNOSIS — R740 Nonspecific elevation of levels of transaminase and lactic acid dehydrogenase [LDH]: Secondary | ICD-10-CM

## 2017-02-10 DIAGNOSIS — R7401 Elevation of levels of liver transaminase levels: Secondary | ICD-10-CM

## 2017-02-10 DIAGNOSIS — K810 Acute cholecystitis: Secondary | ICD-10-CM | POA: Diagnosis not present

## 2017-02-10 DIAGNOSIS — R000 Tachycardia, unspecified: Secondary | ICD-10-CM | POA: Diagnosis not present

## 2017-02-10 DIAGNOSIS — R112 Nausea with vomiting, unspecified: Secondary | ICD-10-CM | POA: Diagnosis not present

## 2017-02-10 DIAGNOSIS — G893 Neoplasm related pain (acute) (chronic): Secondary | ICD-10-CM | POA: Diagnosis not present

## 2017-02-10 MED ORDER — METOCLOPRAMIDE HCL 10 MG PO TABS: 10.0000 mg | ORAL_TABLET | Freq: Three times a day (TID) | ORAL | 0 refills | Status: AC

## 2017-02-10 MED ORDER — POLYETHYLENE GLYCOL 3350 17 G PO PACK: 17.0000 g | PACK | Freq: Every day | ORAL | 1 refills | Status: AC

## 2017-02-10 MED ORDER — LORAZEPAM 0.5 MG PO TABS: 0.5000 mg | ORAL_TABLET | Freq: Four times a day (QID) | ORAL | 0 refills | Status: AC | PRN

## 2017-02-10 MED ORDER — HEPARIN SOD (PORK) LOCK FLUSH 100 UNIT/ML IV SOLN
500.0000 [IU] | INTRAVENOUS | Status: AC | PRN
  Administered 2017-02-10: 500 [IU]

## 2017-02-10 MED ORDER — ONDANSETRON 8 MG PO TBDP: 8.0000 mg | ORAL_TABLET | Freq: Three times a day (TID) | ORAL | 0 refills | Status: AC | PRN

## 2017-02-10 MED ORDER — MORPHINE SULFATE (CONCENTRATE) 10 MG /0.5 ML PO SOLN: 10.0000 mg | ORAL | 0 refills | Status: AC | PRN

## 2017-02-10 MED ORDER — PANTOPRAZOLE SODIUM 40 MG PO TBEC: 40.0000 mg | DELAYED_RELEASE_TABLET | Freq: Every day | ORAL | 1 refills | Status: AC

## 2017-02-10 NOTE — Care Management Note (Addendum)
Case Management Note  Patient Details  Name: Melinda Tanner MRN: [id number] Date of Birth: 03-26-1970  Subjective/Objective:     Pt admitted with persistent nausea and vomiting - pt has metastatic breast cancer            Action/Plan:  PTA independent from home with husband and children - pt is able to communicate in basic English.  Pt confimred she has decided to discharge home with hospice.  CM spoke with pt with the assistance daughter - pt declined interpreter and requested daughter translate items when needed - pt was able to read choice list and inform CM that she chose [facility] [place].  Pt informed CM that she will discharge via private vehicle driven by husband.  CM contacted [facility] and left referral information - referral has not yet been accepted.     Expected Discharge Date:  02/09/17               Expected Discharge Plan:  Home w Hospice Care  In-House Referral:     Discharge planning Services  CM Consult  Post Acute Care Choice:    Choice offered to:  Patient, Adult Children  DME Arranged:    DME Agency:     HH Arranged:    [place] Agency:  [facility]  Status of Service:  In process, will continue to follow  If discussed at Long Length of Stay Meetings, dates discussed:    Additional Comments: CM requested bedside nurse to fax discharge summary and copy of narc/pain management scripts to the number below.  CM also asked bedside nurse to explain at discharge that pt will need to get prescriptions filled once discharge.  Currently these documents are not available for CM to fax.  Daughter will contact Hospice once pt arrives home.    Suanne Marker confirmed home with hospice referral has been accepted - informed that pt will discharge home today.  CM faxed requested documentation to [phone]. CM requested attending to complete discharge summary and pain medications prescriptions so information can also be forwarded to [place].  Rhonda from [place] accepted referral - she will follow up directly with pt/daughter/husband and communicate with CM post conversation.    Maryclare Labrador, RN 02/10/2017, 9:15 AM

## 2017-02-10 NOTE — Progress Notes (Signed)
Three prescription pages faxed to [place] at 1530.

## 2017-02-10 NOTE — Progress Notes (Signed)
Melinda Tanner to be D/C'd Home per MD order.  Discussed with the patient and all questions fully answered.  VSS, Skin clean, dry and intact without evidence of skin break down, no evidence of skin tears noted. IV catheter discontinued intact. Site without signs and symptoms of complications. Dressing and pressure applied.  An After Visit Summary was printed and given to the patient. Patient received prescription.  D/c education completed with patient/family including follow up instructions, medication list, d/c activities limitations if indicated, with other d/c instructions as indicated by MD - patient able to verbalize understanding, all questions fully answered.   Patient instructed to return to ED, call 911, or call MD for any changes in condition.   Patient escorted via [place], and D/C home via private auto.  Holley Raring 02/10/2017 4:23 PM

## 2017-02-10 NOTE — Discharge Summary (Signed)
Physician Discharge Summary  Melinda Tanner DZH:[id number] DOB: [birth year] DOA: 02/04/2017  PCP: Theotis Burrow, MD  Admit date: 02/04/2017 Discharge date: 02/10/2017  Time spent: 35 minutes  Recommendations for Outpatient Follow-up:  1. Comfort care and symptomatic management   Discharge Diagnoses:  Principal Problem:   Cholecystitis, acute Active Problems:   Metastatic breast cancer (HCC)   Nausea with vomiting   Tachycardia   Cholecystitis   Pericardial effusion   Jaundice, obstructive, intrahepatic   Palliative care by specialist   Transaminitis   Discharge Condition: stable and comfortable. Discharge home with arranged home hospice.  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  In the ED a RUQ [place] noted gallbladder wall thickening and a 1.2 cm gallstone suggesting acute cholecystitis, w/ multiple metastatic lesions within the liver.  MRCP noted mild intrahepatic biliary duct distension with marked attenuation of the common duct in the region of the porta hepatis, likely due to metastatic disease in the central liver with metastatic lymphadenopathy in the porta hepatis also a consideration. The distal common bile duct was not well seen and an occlusion of the common bile duct in the porta hepatis towards the head of pancreas was also a concern.  The pt was transferred to [facility] for evaluations by GI and Surgery.     Hospital Course:  Acute cholecystitis/transaminitis  -Gen Surgery not anticipating to performed any intervention; [place] do not demonstrated obstructive cholecystitis.  -at discharge no further abx's -GI decline ERCP, as no  stenting options would help her. -IR not considering that percutaneous drainage will offer any help or assistance; as no dilatation or congestion seen on [place]. -oncology service also unable to offer anything else at this point; as radiation of further chemotherapy will have no role in acute treatment.  -after long discussion with patient and family; decision has been made for symptomatic management and comfort care. -will discharge home with hospice.   Obstructive jaundice/concern for cholangitis -Likely due to metastatic breast CA/obstructing metastatic mass -GI does not feel an ERCP will benefit her and has suggested percutaneous drainage of the liver as well as GB, w/ 2 possible drains needing to be place. IR consulted, but as mentioned above has decline intervention at this point and recommended oncology evaluation. -Following oncology recommendations there is no role of any active chemotherapy or radiation at this moment.  They recommend to reassess the potentiality of any kind of drain that will allow a decrease in patient's bilirubin and drainage of overall liver congestion.   -Palliative care has been consulted and after long discussion that we have with them; plan is for DNR, comfort care and home hospice. -at this point will stop antibiotics -continue PRN antiemetics and analgesics -comfort management and symptoms control -plan is to arrange home hospice and discharge on 11/24.   Constipation -Patient continue to have small BM's with current bowel regimen -will discharge on MiraLAX and Colace.    Trop elevation -no CP, no SOB -no ischemic changes on EKG -mild elevation in setting of metastatic disease and large pericardial effusion. -Continue to manage symptoms only; patient is now a full comfort  care   Large circumferential pericardial effusionw/o tamponade.  -Cardiology has evaluated patient and given the fact she is no symptomatic at this time no recommendations for  pericardial window or further intervention needed. -will focus on full comfort only   Severe Hypokalemia -associated with poor PO intake -continue maintenance supplementation   -no further blood work at this moment. -focusing on comfort and symptomatic management only  Metastatic breast cancer -Diagnosed 2009 - s/p B mastectomy, chemo, and XRT -with metastatic disease to bone and liver; patient doing poorly and already on salvage chemotherapy.  -Discussing with oncology service there is no role for further radiation and has recommended palliative care involvement.   -patient has decided to pursuit comfort care and symptoms management only  Severe malnutrition in context of chronic illness -appreciate Nutritional service rec's and inputs  -continue breeze/boost/ensure if she wants.  -Full liquid to soft diet as tolerated; comfort feeding     Procedures:  See below for x-ray reports   Consultations: Gen Surgery  GI IR Oncology Palliative care    Discharge Exam: Vitals:   02/10/17 0548 02/10/17 1440  BP: 117/80 117/83  Pulse: (!) 113 (!) 115  Resp: 18 18  Temp: 98.6 F (37 C) 98.4 F (36.9 C)  SpO2: 99% 99%   General: afebrile, no CP, no SOB. Patient less anxious. Still complaining of RUQ pain, nausea and vomiting (even last two are somewhat better with current medication regimen).  Lungs: no wheezing, no crackles, good air movement and good O2 sat on RA. Cardiovascular: mil sinus tachycardia, no rubs, no gallops, no JVD.    Abdomen: soft, no guarding; positive RUQ pain on palpation. Positive BS. Had BM overnight. Extremities: no cyanosis, no clubbing, no erythema. Trace edema bilaterally. Neurologic exam:no focal motor or neurologic deficit appreciated. CN intact    Discharge Instructions   Discharge Instructions    Discharge instructions   Complete by:  As directed    Take medications as prescribed Follow instructions from hospice Maintain yourself well  hydrated Follow soft diet     Current Discharge Medication List    START taking these medications   Details  metoCLOPramide (REGLAN) 10 MG tablet Take 1 tablet (10 mg total) by mouth 3 (three) times daily with meals. Qty: 90 tablet, Refills: 0    Morphine Sulfate (MORPHINE CONCENTRATE) 10 mg / 0.5 ml concentrated solution Take 0.5 mLs (10 mg total) by mouth every 4 (four) hours as needed for moderate pain or shortness of breath. Qty: 30 mL, Refills: 0    ondansetron (ZOFRAN ODT) 8 MG disintegrating tablet Take 1 tablet (8 mg total) by mouth every 8 (eight) hours as needed for nausea or vomiting. Qty: 30 tablet, Refills: 0    polyethylene glycol (MIRALAX / GLYCOLAX) packet Take 17 g by mouth daily. Qty: 28 each, Refills: 1      CONTINUE these medications which have CHANGED   Details  LORazepam (ATIVAN) 0.5 MG tablet Take 1 tablet (0.5 mg total) by mouth every 6 (six) hours as needed for anxiety (intractable nausea). 1/2-1 tab (0.25mg  to 0.5mg ) every 6 hours PRN nausea Qty: 35 tablet, Refills: 0    pantoprazole (PROTONIX) 40 MG tablet Take 1 tablet (40 mg total) by mouth daily. Qty: 30 tablet, Refills: 1   Associated Diagnoses: Bone metastasis ([place]); Brain metastases ([place]); Metastatic breast cancer ([place]); Cancer associated pain; Cough      CONTINUE these medications which have NOT CHANGED   Details  CALCIUM-VITAMIN D PO Take 1  tablet by mouth daily.   Associated Diagnoses: Metastatic breast cancer ([place]); Bone metastasis ([place]); Brain metastases ([place]); Goals of care, counseling/discussion    Cyanocobalamin (VITAMIN B-12 PO) Take by mouth.    promethazine (PHENERGAN) 12.5 MG tablet Take 1 tablet (12.5 mg total) by mouth every 6 (six) hours as needed for nausea or vomiting. Qty: 30 tablet, Refills: 0    dexamethasone (DECADRON) 4 MG tablet Take 1 tablet (4 mg total) by mouth daily. Qty: 25 tablet, Refills: 0    docusate sodium (COLACE) 100 MG capsule Take 1 tablet once or twice  daily as needed for constipation while taking narcotic pain medicine Qty: 30 capsule, Refills: 0    potassium chloride SA (K-DUR,KLOR-CON) 20 MEQ tablet Take 1 tablet (20 mEq total) by mouth daily. Qty: 3 tablet, Refills: 1      STOP taking these medications     ibuprofen (ADVIL,MOTRIN) 800 MG tablet      megestrol (MEGACE) 400 MG/10ML suspension      Multiple Vitamins-Minerals (CENTRUM ADULTS PO)      omeprazole (PRILOSEC) 20 MG capsule      traMADol (ULTRAM) 50 MG tablet        Allergies  Allergen Reactions  . Taxol [Paclitaxel] Anaphylaxis  . Penicillins Swelling    Has patient had a PCN reaction causing immediate rash, facial/tongue/throat swelling, SOB or lightheadedness with hypotension: No Has patient had a PCN reaction causing severe rash involving mucus membranes or skin necrosis: No Has patient had a PCN reaction that required hospitalization: Yes Has patient had a PCN reaction occurring within the last 10 years: No If all of the above answers are "NO", then may proceed with Cephalosporin use.  **Localized swelling in the arm**   . Aspirin Swelling  . Zofran [Ondansetron Hcl] Nausea And Vomiting      The results of significant diagnostics from this hospitalization (including imaging, microbiology, ancillary and laboratory) are listed below for reference.    Significant Diagnostic Studies: Ct Angio Chest Pe W Or Wo Contrast  Result Date: 02/05/2017 CLINICAL DATA:  Metastatic breast cancer. Positive D-dimer. Shortness of breath. EXAM: CT ANGIOGRAPHY CHEST WITH CONTRAST TECHNIQUE: Multidetector CT imaging of the chest was performed using the standard protocol during bolus administration of intravenous contrast. Multiplanar CT image reconstructions and MIPs were obtained to evaluate the vascular anatomy. CONTRAST:  67mL ISOVUE-370 IOPAMIDOL (ISOVUE-370) INJECTION 76% COMPARISON:  11/29/2016 FINDINGS: Cardiovascular: No filling defects in the pulmonary arteries to  suggest pulmonary emboli. Small pericardial effusion, similar to prior study. Aorta is normal caliber. Mediastinum/Nodes: Small scattered mediastinal lymph nodes, similar prior study. No hilar or axillary adenopathy. Lungs/Pleura: Large right pleural effusion with compressive atelectasis in the right lower lobe. Biapical scarring. Nodular densities in the right upper lobe are again noted, stable. Linear subsegmental atelectasis at the left base. Upper Abdomen: Extensive metastatic disease seen throughout the liver, better seen on prior abdominal CT. Small stable nodule in the left adrenal gland. Musculoskeletal: Bilateral breast implants noted. Medial left Port-A-Cath scratched at medial left chest wall Port-A-Cath noted, unchanged. Extensive bony metastatic disease again noted throughout the thoracic spine and lower cervical spine as well as multiple bilateral ribs, unchanged. Review of the MIP images confirms the above findings. IMPRESSION: No evidence of pulmonary embolus. Large right pleural effusion, new since prior study. Compressive atelectasis in the right lower lobe. Trace left pleural effusion. Stable small pericardial effusion. Stable borderline sized mediastinal lymph nodes. Nodular areas in the right upper lobe/ apex are stable. Biapical  scarring. Extensive metastatic disease throughout the liver and bones, unchanged. Stable left adrenal nodule. Electronically Signed   By: Rolm Baptise M.D.   On: 02/05/2017 11:39   Mr Abdomen With Mrcp W Contrast  Addendum Date: 02/03/2017   ADDENDUM REPORT: 02/03/2017 18:28 ADDENDUM: As mentioned in the body of the report, there is mild left hydroureteronephrosis of indeterminate etiology, but new since 12/22/2016. Electronically Signed   By: Misty Stanley M.D.   On: 02/03/2017 18:28   Result Date: 02/03/2017 CLINICAL DATA:  Breast cancer with metastatic disease to the liver. Abdominal pain and fever. Ultrasound earlier today suggested acute cholecystitis.  EXAM: MRI ABDOMEN WITHOUT CONTRAST  (INCLUDING MRCP) TECHNIQUE: Multiplanar multisequence MR imaging of the abdomen was performed. Heavily T2-weighted images of the biliary and pancreatic ducts were obtained, and three-dimensional MRCP images were rendered by post processing. COMPARISON:  Ultrasound exam earlier today.  Abdomen CT 12/22/2016 FINDINGS: Lower chest:  Small right pleural effusion. Hepatobiliary: Multiple liver mets are identified with 1 of the more dominant lesions measuring 3.4 cm in the posterior right hepatic lobe. There is an area confluent metastatic disease in the central liver near the hepatic duct confluence measuring 3.9 x 5.4 cm. MRCP images show mild intrahepatic biliary duct dilatation with marked attenuation of the bile duct in the region of the porta hepatis. Common duct cannot be traced through the porta hepatis into the head of the pancreas and common duct inclusion in the hepatoduodenal ligament is also concern, but this is not well evaluated given motion artifact. 7 mm gallstone evident with apparent gallbladder wall thickening and/ or pericholecystic edema Pancreas: Not well seen due to motion artifact from patient difficulty with reproducible breath holding. No dilatation the main pancreatic duct. Spleen: No splenomegaly. No focal mass lesion. Adrenals/Urinary Tract: No adrenal nodule or mass. Right kidney unremarkable. There is mild left hydroureteronephrosis although full extent of the distended left ureter not visualized. Stomach/Bowel: Stomach is nondistended. No gastric wall thickening. No evidence of outlet obstruction. Duodenum is normally positioned as is the ligament of Treitz. No small bowel or colonic dilatation within the visualized abdomen. Vascular/Lymphatic: No abdominal aortic aneurysm. Probable lymphadenopathy in the porta hepatis. Other: Small volume intraperitoneal free fluid evident. Musculoskeletal: Multiple foci of abnormal marrow enhancement compatible with  bony metastatic disease. IMPRESSION: 1. Mild intrahepatic biliary duct distension with marked attenuation of the common duct in the region of the porta hepatis, likely due to metastatic disease in the central liver with metastatic lymphadenopathy in the porta hepatis also a consideration. The distal common bile duct is not well seen as study is fairly motion degraded and an occlusion of the common bile duct in the porta hepatis towards the head of pancreas is also concern. No evidence for choledocholithiasis. 2. Cholelithiasis with edema or fluid in/around the gallbladder wall. As noted on ultrasound exam earlier today, cholecystitis is a concern. 3. Bulky metastatic disease in the liver as seen on recent CT scan. Electronically Signed: By: Misty Stanley M.D. On: 02/03/2017 18:23   US Abdomen Limited Ruq  Result Date: 02/09/2017 CLINICAL DATA:  Metastatic breast carcinoma to the liver with increasing bilirubin level, underlying cholelithiasis and concern for cholecystitis and biliary obstruction. EXAM: ULTRASOUND ABDOMEN LIMITED RIGHT UPPER QUADRANT COMPARISON:  Multiple prior imaging studies including MRI of the abdomen on 02/03/2017 and prior ultrasound on 02/03/2017. FINDINGS: Gallbladder: The gallbladder is now contracted and contains at least one shadowing gallstone. Gallbladder wall continues to be prominent in thickness. However, this may relate  largely to contraction and there was no focal tenderness overlying the gallbladder during the sonographic examination. Common bile duct: Diameter: Normal caliber of 5 mm. Liver: Multiple metastatic lesions again identified in the liver. No significant intrahepatic biliary ductal dilatation identified. Portal vein is patent on color Doppler imaging with normal direction of blood flow towards the liver. IMPRESSION: 1. No evidence to suggest significant acute cholecystitis. The gallbladder is not distended and is now contracted. There remains at least 1 gallstone  within the gallbladder lumen. 2. No significant evidence of biliary obstruction with nondilated common duct and no evidence of significant intrahepatic biliary ductal dilatation. Elevated bilirubin may be on the basis of overall hepatic failure and diffuse progression of metastatic disease. Electronically Signed   By: Aletta Edouard M.D.   On: 02/09/2017 13:02   US Abdomen Limited Ruq  Result Date: 02/03/2017 CLINICAL DATA:  Elevated LFTs. Breast cancer with known metastatic disease to the liver. EXAM: ULTRASOUND ABDOMEN LIMITED RIGHT UPPER QUADRANT COMPARISON:  None. FINDINGS: Gallbladder: Gallbladder wall thickening measures 4 mm. Shadowing stones are present at the fundus, measuring up to 1.2 cm. There is sludge. No sonographic Murphy's sign is reported. However, the patient has received pain medication. Common bile duct: Diameter: 5.0 mm, within normal limits. Liver: Multiple heterogeneous hypoechoic lesions are again seen. The lesion posteriorly in the right frontal lobe measures 4.0 x 4.5 x 4.1 cm. More laterally in the right lobe the lesion measures 3.6 x 3.3 x 3.0 cm. The lesion in the left lobe measures 2.4 x 1.8 x 2.6 cm. These lesions are similar to the prior ultrasound. Portal vein is patent on color Doppler imaging with normal direction of blood flow towards the liver. IMPRESSION: 1. Gallbladder wall thickening and 1.2 cm gallstone suggesting acute cholecystitis. 2. Multiple metastatic lesions again noted within the liver. Electronically Signed   By: San Morelle M.D.   On: 02/03/2017 14:54    Microbiology: Recent Results (from the past 240 hour(s))  Urine culture     Status: None   Collection Time: 02/03/17 11:39 AM  Result Value Ref Range Status   Specimen Description URINE, RANDOM  Final   Special Requests NONE  Final   Culture   Final    NO GROWTH Performed at [facility], [address], [place], [place] [address]    Report Status 02/05/2017 FINAL  Final   Culture, blood (routine x 2)     Status: None   Collection Time: 02/03/17 12:45 PM  Result Value Ref Range Status   Specimen Description BLOOD BLOOD RIGHT WRIST  Final   Special Requests   Final    BOTTLES DRAWN AEROBIC AND ANAEROBIC Blood Culture results may not be optimal due to an inadequate volume of blood received in culture bottles   Culture NO GROWTH 5 DAYS  Final   Report Status 02/08/2017 FINAL  Final  Culture, blood (routine x 2)     Status: None   Collection Time: 02/03/17  3:48 PM  Result Value Ref Range Status   Specimen Description BLOOD BLOOD RIGHT WRIST  Final   Special Requests   Final    BOTTLES DRAWN AEROBIC AND ANAEROBIC Blood Culture adequate volume   Culture NO GROWTH 5 DAYS  Final   Report Status 02/08/2017 FINAL  Final  MRSA PCR Screening     Status: None   Collection Time: 02/04/17  7:34 PM  Result Value Ref Range Status   MRSA by PCR NEGATIVE NEGATIVE Final  Comment:        The GeneXpert MRSA Assay (FDA approved for NASAL specimens only), is one component of a comprehensive MRSA colonization surveillance program. It is not intended to diagnose MRSA infection nor to guide or monitor treatment for MRSA infections.      Labs: Basic Metabolic Panel: Recent Labs  Lab 02/04/17 2250 02/05/17 0600 02/06/17 0530 02/06/17 1622 02/07/17 0459 02/09/17 0345  NA  --  136 135 135 134* 135  K  --  3.7 2.7* 3.0* 2.9* 3.0*  CL  --  109 107 107 106 105  CO2  --  17* 19* 21* 21* 22  GLUCOSE  --  76 100* 134* 99 111*  BUN  --  <5* <5* <5* <5* <5*  CREATININE  --  0.83 0.81 0.72 0.68 0.73  CALCIUM  --  6.5* 6.7* 6.9* 6.8* 6.7*  MG 1.6*  --   --   --  2.0  --    Liver Function Tests: Recent Labs  Lab 02/04/17 1618 02/05/17 0600 02/06/17 0530 02/07/17 0459 02/09/17 0345  AST 164* 144* 144* 125* 97*  ALT 111* 94* 101* 90* 62*  ALKPHOS 387* 360* 381* 388* 328*  BILITOT 6.0* 6.2* 7.9* 7.9* 8.4*  PROT 6.2* 5.1* 5.3* 4.8* 5.3*  ALBUMIN 2.9* 2.4* 2.5*  2.5* 2.4*    Recent Labs  Lab 02/06/17 0550  AMMONIA 49*   CBC: Recent Labs  Lab 02/04/17 1618 02/05/17 0600 02/06/17 0530 02/07/17 0459 02/09/17 0345  WBC 23.7* 19.3* 14.3* 14.2* 16.2*  NEUTROABS 22.8*  --   --   --   --   HGB 10.6* 9.0* 9.2* 9.5* 9.0*  HCT 32.5* 28.1* 28.5* 28.5* 27.2*  MCV 94.7 93.7 92.8 91.3 91.6  PLT 151 136* 150 162 177   Cardiac Enzymes: Recent Labs  Lab 02/04/17 2250 02/05/17 0600  TROPONINI <0.03 <0.03    Signed:  Barton Dubois MD.  [facility] 02/10/2017, 2:51 PM

## 2017-02-10 NOTE — Care Management Note (Signed)
Case Management Note  Patient Details  Name: Melinda Tanner MRN: [id number] Date of Birth: 10-16-1970  Subjective/Objective: Prescriptions given to family and faxed to [facility] by bedside RN. No Other CM needs at this time.                    Action/Plan:CM will sign off for now but will be available should additional discharge needs arise or disposition change.    Expected Discharge Date:  02/10/17               Expected Discharge Plan:  Home w Hospice Care  In-House Referral:     Discharge planning Services  CM Consult  Post Acute Care Choice:    Choice offered to:  Patient, Adult Children  DME Arranged:    DME Agency:     HH Arranged:    [place] Agency:  [facility]  Status of Service:  Completed, signed off  If discussed at [place] of Stay Meetings, dates discussed:    Additional Comments:  Delrae Sawyers, RN 02/10/2017, 3:49 PM

## 2017-02-12 ENCOUNTER — Telehealth: Payer: Self-pay | Admitting: *Deleted

## 2017-02-12 NOTE — Telephone Encounter (Signed)
Referral faxed

## 2017-02-12 NOTE — Telephone Encounter (Signed)
Received request for Hospice services for patient. Asking if Dr Mike Gip in agreement ans if she will sign orders. If so, please fax referral and demographics and office note to them.

## 2017-02-13 ENCOUNTER — Inpatient Hospital Stay (HOSPITAL_BASED_OUTPATIENT_CLINIC_OR_DEPARTMENT_OTHER): Payer: Medicare HMO | Admitting: Hematology and Oncology

## 2017-02-13 VITALS — BP 119/85 | HR 119 | Temp 98.4°F

## 2017-02-13 DIAGNOSIS — K831 Obstruction of bile duct: Secondary | ICD-10-CM

## 2017-02-13 DIAGNOSIS — C7970 Secondary malignant neoplasm of unspecified adrenal gland: Secondary | ICD-10-CM

## 2017-02-13 DIAGNOSIS — C7931 Secondary malignant neoplasm of brain: Secondary | ICD-10-CM

## 2017-02-13 DIAGNOSIS — C787 Secondary malignant neoplasm of liver and intrahepatic bile duct: Secondary | ICD-10-CM | POA: Diagnosis not present

## 2017-02-13 DIAGNOSIS — E538 Deficiency of other specified B group vitamins: Secondary | ICD-10-CM

## 2017-02-13 DIAGNOSIS — Z79899 Other long term (current) drug therapy: Secondary | ICD-10-CM

## 2017-02-13 DIAGNOSIS — Z923 Personal history of irradiation: Secondary | ICD-10-CM | POA: Diagnosis not present

## 2017-02-13 DIAGNOSIS — C7951 Secondary malignant neoplasm of bone: Secondary | ICD-10-CM | POA: Diagnosis not present

## 2017-02-13 DIAGNOSIS — R531 Weakness: Secondary | ICD-10-CM

## 2017-02-13 DIAGNOSIS — C7801 Secondary malignant neoplasm of right lung: Secondary | ICD-10-CM

## 2017-02-13 DIAGNOSIS — Z9223 Personal history of estrogen therapy: Secondary | ICD-10-CM | POA: Diagnosis not present

## 2017-02-13 DIAGNOSIS — D649 Anemia, unspecified: Secondary | ICD-10-CM

## 2017-02-13 DIAGNOSIS — Z170 Estrogen receptor positive status [ER+]: Secondary | ICD-10-CM | POA: Diagnosis not present

## 2017-02-13 DIAGNOSIS — C770 Secondary and unspecified malignant neoplasm of lymph nodes of head, face and neck: Secondary | ICD-10-CM | POA: Diagnosis not present

## 2017-02-13 DIAGNOSIS — Z5111 Encounter for antineoplastic chemotherapy: Secondary | ICD-10-CM | POA: Diagnosis not present

## 2017-02-13 DIAGNOSIS — C50919 Malignant neoplasm of unspecified site of unspecified female breast: Secondary | ICD-10-CM

## 2017-02-13 DIAGNOSIS — C7889 Secondary malignant neoplasm of other digestive organs: Secondary | ICD-10-CM

## 2017-02-13 DIAGNOSIS — R5383 Other fatigue: Secondary | ICD-10-CM

## 2017-02-13 DIAGNOSIS — C50911 Malignant neoplasm of unspecified site of right female breast: Secondary | ICD-10-CM

## 2017-02-13 DIAGNOSIS — Z9013 Acquired absence of bilateral breasts and nipples: Secondary | ICD-10-CM | POA: Diagnosis not present

## 2017-02-13 DIAGNOSIS — Z7189 Other specified counseling: Secondary | ICD-10-CM

## 2017-02-13 DIAGNOSIS — Z853 Personal history of malignant neoplasm of breast: Secondary | ICD-10-CM

## 2017-02-13 DIAGNOSIS — Z780 Asymptomatic menopausal state: Secondary | ICD-10-CM

## 2017-02-13 DIAGNOSIS — R42 Dizziness and giddiness: Secondary | ICD-10-CM

## 2017-02-13 NOTE — Progress Notes (Signed)
Patient states she is very tired. Exhausted.  Liquid diet mostly - very little solids.   Bilateral lower extremity edema.  Scant urine - dark in color per patient.   Patient's skin, sclera  is jaundiced.  Patient presents in wheelchair today, accompanied by her husband. She is only taking Reglan, MS and laxative.  Patient is prepared to initiate advance directive.

## 2017-02-13 NOTE — Progress Notes (Signed)
[facility] day:  02/13/2017    Chief Complaint: Melinda Tanner is a 46 y.o. female with metastatic breast cancer with brain metastasis who is seen for reassessment after interval hospitalization.   HPI:  The patient was last seen in the medical oncology clinic on 01/23/2017.  At that time, she was fatigued.  LFTs had improved.  She received day 8 of cycle #2 eribulin.  She presented to [facility] on 02/03/2017 with nausea and vomiting. Right upper quadrant ultrasound noted gallbladder wall thickening and a 1.2 cm gallstone suggesting acute cholecystitis with multiple liver metastatic lesion.  MRCP noted mild intrahepatic biliary duct distension with marked attenuation of the common duct in the region of the porta hepatis, likely due to metastatic disease in the central liver with metastatic lymphadenopathy in the porta hepatis also a consideration. The distal common bile duct was not well seen and an occlusion of the common bile duct in the porta hepatis towards the head of pancreas was also a concern. She was transferred to [facility] for evaluations by GI and Surgery.   She was admitted to [facility] in [place] from 02/04/2017 - 02/10/2017. General surgery did not performany intervention.  Ultrasound did not demonstrate obstructive cholecystitis.GI declined ERCP, as no stenting options would help her.  Interventional radiology did not consider percutaneous drainage would offer any help or assistance as no dilatation or congestion seen on [place].  Oncology service were unable to offer anything else.  Radiation and chemotherapy had no role in acute treatment. After long discussion with patient and family; decision was made for symptomatic management and comfort care.  She was discharged home with Hospice. Bilirubin was 8.4 on 02/09/2017.   Symptomatically, she has pain in her lower back.  Pain is worse at night. Patient is on Roxinol.  She is using 10  mg (0.27m) every 4 hours PRN.  She has some nausea for which she is using Metoclopramide. Patient is maintaining a liquid diet, with some introduction of soft foods. Patient is only urinating small amounts at this point. She notes that her urine is "very dark". Patient is experiencing constipation. She is using Miralax and Docusate. Patient notes swelling in her legs. She also advises that her lymph nodes in her neck have been swollen. Patient presents today significantly jaundiced, with accompanying scleral icterus noted.   Patient is resting for the majority of the day. She is taking naps during the day. Patient's parents and sister are in the home and offer support while the patient's husband is at work. She requires assistance for all of her activities of daily living. Patient has been admitted to Hospice services with [place].  Patient is tearful during the visit.    Past Medical History:  Diagnosis Date  . Brain cancer ([facility] 2012   Met. from Breast  . Breast cancer ([facility] 2009  . Complication of anesthesia    nausea, "drops in potassium and magnesium"  . Pericardial effusion 02/05/2017  . [place]     Past Surgical History:  Procedure Laterality Date  . BRAIN SURGERY  2012, 2106  . CESAREAN SECTION    . LAPAROSCOPIC BILATERAL SALPINGO OOPHERECTOMY Bilateral 05/18/2015   Procedure: LAPAROSCOPIC BILATERAL SALPINGO OOPHORECTOMY;  Surgeon: SWill Bonnet MD;  Location: [facility] ORS;  Service: Gynecology;  Laterality: Bilateral;  . MASTECTOMY Bilateral 2010    Family History  Problem Relation Age of Onset  . Cancer Paternal Aunt   .  Cancer Paternal Uncle   . Cancer Paternal Grandfather   . Hypertension Brother   . Diabetes Paternal Grandmother   . CAD Father   A paternal aunt had breast cancer age 47, a paternal uncle had prostate cancer, and a paternal grandfather had leukemia.  Social History:  reports that  has never smoked. she has never used smokeless tobacco.  She reports that she does not drink alcohol or use drugs.  She is from [place].  She moved to [place] in 2015.  She moved to [place] in 04/2014.  She recently moved into the [place] area.  She has 2 children (boy and girl).  Her family will likely be moving to [facility] in December.  They are looking for a home.  She went on a trip to [place] 10/18/2016 - 10/24/2016.  The patient is accompanied by her husband and [facility] interpreter).  Allergies:  Allergies  Allergen Reactions  . Taxol [Paclitaxel] Anaphylaxis  . Penicillins Swelling    Has patient had a PCN reaction causing immediate rash, facial/tongue/throat swelling, SOB or lightheadedness with hypotension: No Has patient had a PCN reaction causing severe rash involving mucus membranes or skin necrosis: No Has patient had a PCN reaction that required hospitalization: Yes Has patient had a PCN reaction occurring within the last 10 years: No If all of the above answers are "NO", then may proceed with Cephalosporin use.  **Localized swelling in the arm**   . Aspirin Swelling  . Zofran [Ondansetron Hcl] Nausea And Vomiting    Current Medications: Current Outpatient Medications  Medication Sig Dispense Refill  . metoCLOPramide (REGLAN) 10 MG tablet Take 1 tablet (10 mg total) by mouth 3 (three) times daily with meals. 90 tablet 0  . Morphine Sulfate (MORPHINE CONCENTRATE) 10 mg / 0.5 ml concentrated solution Take 0.5 mLs (10 mg total) by mouth every 4 (four) hours as needed for moderate pain or shortness of breath. 30 mL 0  . polyethylene glycol (MIRALAX / GLYCOLAX) packet Take 17 g by mouth daily. 28 each 1  . CALCIUM-VITAMIN D PO Take 1 tablet by mouth daily.    . Cyanocobalamin (VITAMIN B-12 PO) Take by mouth.    . dexamethasone (DECADRON) 4 MG tablet Take 1 tablet (4 mg total) by mouth daily. (Patient not taking: Reported on 02/03/2017) 25 tablet 0  . docusate sodium (COLACE) 100 MG capsule Take 1 tablet once or  twice daily as needed for constipation while taking narcotic pain medicine (Patient not taking: Reported on 12/25/2016) 30 capsule 0  . LORazepam (ATIVAN) 0.5 MG tablet Take 1 tablet (0.5 mg total) by mouth every 6 (six) hours as needed for anxiety (intractable nausea). 1/2-1 tab (0.85m to 0.578m every 6 hours PRN nausea (Patient not taking: Reported on 02/13/2017) 35 tablet 0  . ondansetron (ZOFRAN ODT) 8 MG disintegrating tablet Take 1 tablet (8 mg total) by mouth every 8 (eight) hours as needed for nausea or vomiting. (Patient not taking: Reported on 02/13/2017) 30 tablet 0  . pantoprazole (PROTONIX) 40 MG tablet Take 1 tablet (40 mg total) by mouth daily. (Patient not taking: Reported on 02/13/2017) 30 tablet 1  . potassium chloride SA (K-DUR,KLOR-CON) 20 MEQ tablet Take 1 tablet (20 mEq total) by mouth daily. (Patient not taking: Reported on 01/16/2017) 3 tablet 1  . promethazine (PHENERGAN) 12.5 MG tablet Take 1 tablet (12.5 mg total) by mouth every 6 (six) hours as needed for nausea or vomiting. (Patient not taking: Reported on 02/13/2017) 30 tablet  0   No current facility-administered medications for this visit.     Review of Systems:  GENERAL:  Fatigued and weak.  Takes naps 1-2x/day.  No fevers or sweats.  No new weight obtained due to condition. PERFORMANCE STATUS (ECOG):  3 HEENT:  No visual changes, runny nose, sore throat, mouth sores or tenderness. Lungs: Shortness of breath with exertion.  No cough.  No hemoptysis. Cardiac:  No chest pain, palpitations, orthopnea, or PND. GI:  Intermittent abdominal pain.  Nausea and vomiting.  Constipation; bowel movements decreased.  No diarrhea, melena or hematochezia.  GU:  Dark and concentrated urine; small amounts of urinary output. No urgency, frequency, dysuria or hematuria. Musculoskeletal: Low back pain.  No muscle tenderness. Extremities:  No pain or swelling. Skin:  No rashes or irritation. Neuro:  No headache, numbness or weakness,  balance or coordination issues.  Endocrine:  No diabetes, thyroid issues, or night sweats.  Hot flashes. Psych:  No mood changes, depression or anxiety.  Pain: No focal pain.  Review of systems:  All other systems reviewed and found to be negative.  Physical Exam: Blood pressure 119/85, pulse (!) 119, temperature 98.4 F (36.9 C), temperature source Tympanic, last menstrual period 03/19/2015. GENERAL:  Fatigued appearing woman sitting comfortably in the exam room in no acute distress.  She is tearful at times. MENTAL STATUS:  Alert and oriented to person, place and time. HEAD: Wearing a black toboggan.  Normocephalic, atraumatic, face symmetric, no Cushingoid features. EYES:  Brown eyes. Scleral icterus.  Pupils equal round and reactive to light and accomodation.  No conjunctivitis. ENT:  Oropharynx clear without lesion.  Tongue normal. Mucous membranes moist.  RESPIRATORY:  Decreased breath sounds at the bases.  Clear to auscultation without rales, wheezes or rhonchi. CARDIOVASCULAR:  Regular rate and rhythm without murmur, rub or gallop. ABDOMEN:  Soft, slightly tender mid upper abdomen without guarding or rebound tenderness.  Active bowel sounds.  Liver edge palpable.  No splenomegaly.  No masses. SKIN: Significantly jaundiced. No rashes, bruises or ulcers. EXTREMITIES:  Chronic lower extremity edema (left > right), tender. Jaundice.  No palpable cords. LYMPH NODES:  Palpable cervical adenopathy.  No supraclavicular, axillary or inguinal adenopathy  NEUROLOGICAL:  Appropriate. PSYCH:  Appropriate.     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 No lymph invasion. Tumor cellularity 20%. [id number]. 07/13/2008: Left mastectomy: Residual ductal carcinoma in situ, high nuclear grade, deep margin negative ypTisNO(sn)MX. 10/25/2010: Abdominal and pelvic ultrasound: questionable lesion near gallbladder fossa recommend MRI follow up. 03/02/2011: Left and right frontal excisional brain biopsy: Adenocarcinoma, metastatic, NOS: ER +, PR 5% +, Her 2 NEG. 03/04/2011: [employer] testing of IHC4 residual recurrence risk score of 114 showing an 8 year recurrence reate of 57%. ER POSTIVE, PR POSITIVE, HER 2 POSTIVE (previously documented negative in 2009) cutoff of 361 patient scores 426. ki67 71%.  08/05/2013: Right breast FNA supraclavicular region: positive for metastatic carcinoma. ER/PR/HER2 not reported. 08/19/2014: Right cervical lymph node. Adenocarcinoma with features consistent with metastatic breast carcinoma. ER/PR/HER2 insufficient tissue. 09/04/2014: Repeat craniotomy, resection of right frontal tumor (metastatic breast cancer). ER 90-100% PR 51-60% HER2 - 09/29/2014: Right cervical lymph node, metastatic carcinoma c/w breast primary, ER 100%  PR 20% HER2-  Imaging: 08/15/2007: Bilateral mammogram: Dense breasts with solid lesion at 6:00, 7:00, and 9:00 of right breast, solid lesion at 2:00 position left breast BIRADS 4B. 09/10/2007: Breast MRI: Three solid irregular enhancing lesions of the right breast 2.1 x 3.2, 2.8 x 2.7, and 1.8 x 1.6 cm. Mildly enlarged enhancing nodule right axilla. Left breast clumped enhancement midportion suspicious for malignancy. 03/01/2011: Brain MRI: At least two juxtacortical, intra-axial masses with extensive surrounding vasogenic edema most consistent with intracranial metastasis. 07/22/2013: Brain MRI +/- contrast: interval increase in enhancing component on T2 FLAIR now measuring 1.0cm x 1.1 cm worrisome for progression of disease. 01/21/2014:  PET scan: Hypermetabolic small right supraclavicular  and right upper paratracheal lymph nodes suspicious for mets. Hypermetabolic lymph node in the right paratracheal upper mediastinum that measure approximately 0.9 x 0.5cm. (of note no impressions made of areas noted on brain MRI 07/22/13).  01/21/2014: Head MRI: Right frontal enhancing lesion has shown interval increase in size with surrounding edema and local mass effect (measured 1.8x2.1x1.6, previously 1.2x1.1x1.0) 07/16/2014: Head MRI:  Right anterior frontal enhancing mass 2.5 x 2.0 cm compatible with a metastatic lesion has increased and changed in configuration from previous 2.0 x 1.8 cm, formerly multilobular. 08/06/2014: PET scan:  Multiple bilateral FDG avid low cervical, supraclavicular, upper mediastinal, and right internal mammary lymphadenopathy, likely representing metastatic disease. A cervical node in the right lower neck should be amenable to percutaneous sampling.  12/2014: Head MRI:  Evolution of right frontal postoperative changes status post tumor resection at the vertex. Expected postoperative changes. Minimal marginal enhancement resection bed with some adjacent posterior intrinsic T1 signal again seen. Decreasing T2/FLAIR adjacent parenchymal changes.  Stable resection cavity is left posterior frontal and right parieto-occipital regions. 04/20/2015 : PET scan: Cervical and thoracic nodal metastasis.  There was multifocal osseous metastasis (left transverse T4 process, left humeral head, right iliac wing, and left side of the sacrum).  Incidental findings included right nephrolithiasis.   04/22/2015: Bone scan: Corresponded with PET-CT with metastatic lesions evident in the left humeral head, left posterior aspect of T4, left aspect of S1, and the right iliac crest.  There was no other definite foci of metastatic disease to bone.  Uptake in the skull was most suggestive of the previous right frontal craniotomy. 05/24/2015: Bone density:  T-score of -0.8 in the right femoral neck  (normal). 07/07/2015:  Head MRI:  Metastatic breast cancer with 3 resection cavities. The anterior right frontal cavity was positive for nodular marginal enhancement, new from brain MRI report 01/07/2015, and consistent with recurrent disease.  08/12/2015:  Lumbar Spine MRI: Osseous metastatic disease at L3, L4, L5, S1, and in the left iliac bone posteriorly. There was no definite epidural metastatic disease. 08/20/2015:  PET scan:  Response to therapy in the lower cervical and thoracic adenopathy.  There was a mixed response to multifocal osseous metastases (some new lesions).  12/06/2015:  Head MRI:  Decreased nodular contrast enhancement at the right frontal lobe treatment site.  There was unchanged appearance of treatment sites within the bilateral parietal lobe size without residual or recurrent disease.  There were no new metastatic lesions. 02/18/2016: PET scan: Mixed response to therapy. There hadbeen improvement in right cervical lymphadenopathy and in several osseous lesions. There wereseveral other osseous lesions in the pelvis with increasing hypermetabolism compared to the prior study. As the majority of the osseous lesions wereincreasingly sclerotic, some of this hypermetabolism could simply reflect bony healing. There was no new extraskeletal metastatic  disease is noted in the neck, chest, abdomen or pelvis. 03/06/2016: Head MRI: Unchanged small focus of nodular enhancement at the right frontal resection site. There was unchanged appearance of the 2 other resection sites without abnormal enhancement. There was no evidence of new intracranial metastases. 05/23/2016:  Bone scan:  Stable focus of abnormal uptake seen in right frontal skull consistent with prior craniotomy.  There were areas of abnormal uptake identified in the left humeral head, T4 and S1 levels and right iliac crest that were present but decreased in intensity compared to prior exam suggesting improvement.  There was a  new foci of abnormal uptake are noted in a right lower rib,T12 and L1 levels of spine concerning for metastatic disease. 06/07/2016:  Thoracic and lumbar spine MRI:  multiple low T1/T2 weighted signal, non enhancing lesions within the thoracic spine, which corresponded in size and location to sclerotic lesions demonstrated on the PET CT of 02/18/2016.  There were no new thoracic lesions.  There was minimal residual contrast enhancement within the L5 vertebral body metastatic lesion, decreased compared to the MRI of 08/12/2015.  There was minimal contrast enhancement at the periphery of the lesion within the L1 vertebral body. This lesion was new compared to MRI of 08/12/2015, but unchanged in size and location compared to PET CT of 02/18/2016.  There was no epidural disease, spinal canal stenosis or neural foraminal encroachment. 06/13/2016:  Head MRI revealed no evidence of residual or recurrent disease. 08/11/2016:  Chest, abdomen, and pelvic CT revealed  new 8 mm right supraclavicular, 12 mm right paratracheal and 10 mm subcarinal lymphadenopathy suspicious for metastatic nodal recurrence.  There was extensive patchy sclerotic osseous metastases throughout the axial and proximal appendicular skeleton appear stable in size and distribution although generally increased in sclerosis since 02/18/2016 PET-CT, which was a nonspecific change that could reflect treatment effect or progression.  There was no additional sites of metastatic disease in the chest, abdomen or pelvis. 08/11/2016:  Bone scan revealed scattered foci of abnormal osseous tracer accumulation consistent with osseous metastatic disease with new sites of abnormal uptake in the thoracic spine at T5 in the posterior LEFT sixth rib. 08/31/2016:  Chest CT angiogram revealed interval confluent opacity in the posterior aspect of the right upper lobe suspicious for pneumonia. There was a small right-sided pleural effusion and adjacent right lower lobe  atelectasis. 10/27/2016:  Head MRI revealed 9 new small brain metastases since 05/2016. The largest was a 10 mm metastasis in the posterior right cerebellum. The smallest lesions were punctate, and 1 of these along the posterior inferior left cerebellar hemisphere was suspicious for leptomeningeal based disease.  There was no associated edema or mass effect. 11/29/2016:  Chest, abdomen, and pelvic CT revealed multiple hepatic metastasis, a 10 mm adrenal metastasis, multiple abdominal nodes, 4.2 x 2.1 cm pancreatic head mass and a separate 1.1 cm lesion in the anterior pancreatic body, new 9 mm paraesophageal lymph node, trace pericardial thickening/fluid, 2 focal lung nodules (10 mm and 5 mm) in the RUL, and an enlarging blastic lesion of T5. 12/22/2016:  Abdomen and pelvic CT revealed slight progression of hepatic metastatic disease.  There was stable to slightly smaller periportal, celiac axis and retrocrural lymph nodes.  There was stable retroperitoneal lymph nodes.  There was stable ill-defined possible lesion in the pancreatic head.  There was a slightly larger lesion in the pancreatic body and a new lesion in the head body junction region.  There was stable diffuse sclerotic osseous metastatic  disease.  There was a small right pleural effusion with overlying atelectasis.   Labs:  No visits with results within 3 Day(s) from this visit.  Latest known visit with results is:  Admission on 02/04/2017, Discharged on 02/10/2017  Component Date Value Ref Range Status  . MRSA by PCR 02/04/2017 NEGATIVE  NEGATIVE Final   Comment:        The GeneXpert MRSA Assay (FDA approved for NASAL specimens only), is one component of a comprehensive MRSA colonization surveillance program. It is not intended to diagnose MRSA infection nor to guide or monitor treatment for MRSA infections.   Marland Kitchen HIV Screen 4th Generation wRfx 02/04/2017 Non Reactive  Non Reactive Final   Comment: (NOTE) Performed At: [facility] [address] [place], [place] [id number] Rush Farmer MD JA:[id number]   . Sodium 02/05/2017 136  135 - 145 mmol/L Final  . Potassium 02/05/2017 3.7  3.5 - 5.1 mmol/L Final  . Chloride 02/05/2017 109  101 - 111 mmol/L Final  . CO2 02/05/2017 17* 22 - 32 mmol/L Final  . Glucose, Bld 02/05/2017 76  65 - 99 mg/dL Final  . BUN 02/05/2017 <5* 6 - 20 mg/dL Final  . Creatinine, Ser 02/05/2017 0.83  0.44 - 1.00 mg/dL Final  . Calcium 02/05/2017 6.5* 8.9 - 10.3 mg/dL Final  . Total Protein 02/05/2017 5.1* 6.5 - 8.1 g/dL Final  . Albumin 02/05/2017 2.4* 3.5 - 5.0 g/dL Final  . AST 02/05/2017 144* 15 - 41 U/L Final  . ALT 02/05/2017 94* 14 - 54 U/L Final  . Alkaline Phosphatase 02/05/2017 360* 38 - 126 U/L Final  . Total Bilirubin 02/05/2017 6.2* 0.3 - 1.2 mg/dL Final  . GFR calc non Af Amer 02/05/2017 >60  >60 mL/min Final  . GFR calc Af Amer 02/05/2017 >60  >60 mL/min Final   Comment: (NOTE) The eGFR has been calculated using the CKD EPI equation. This calculation has not been validated in all clinical situations. eGFR's persistently <60 mL/min signify possible Chronic Kidney Disease.   . Anion gap 02/05/2017 10  5 - 15 Final  . WBC 02/05/2017 19.3* 4.0 - 10.5 K/uL Final  . RBC 02/05/2017 3.00* 3.87 - 5.11 MIL/uL Final  . Hemoglobin 02/05/2017 9.0* 12.0 - 15.0 g/dL Final  . HCT 02/05/2017 28.1* 36.0 - 46.0 % Final  . MCV 02/05/2017 93.7  78.0 - 100.0 fL Final  . MCH 02/05/2017 30.0  26.0 - 34.0 pg Final  . MCHC 02/05/2017 32.0  30.0 - 36.0 g/dL Final  . RDW 02/05/2017 22.2* 11.5 - 15.5 % Final  . Platelets 02/05/2017 136* 150 - 400 K/uL Final  . Magnesium 02/04/2017 1.6* 1.7 - 2.4 mg/dL Final  . Troponin I 02/04/2017 <0.03  <0.03 ng/mL Final  . Troponin I 02/05/2017 <0.03  <0.03 ng/mL Final  . D-Dimer, Quant 02/04/2017 1.86* 0.00 - 0.50 ug/mL-FEU Final   Comment: (NOTE) At the manufacturer cut-off of 0.50 ug/mL FEU, this assay has been documented to exclude PE with a  sensitivity and negative predictive value of 97 to 99%.  At this time, this assay has not been approved by the FDA to exclude DVT/VTE. Results should be correlated with clinical presentation.   . Weight 02/05/2017 2,215.18  oz Final  . Height 02/05/2017 63  in Final  . BP 02/05/2017 107/81  mmHg Final  . TSH 02/05/2017 4.337  0.350 - 4.500 uIU/mL Final   Performed by a 3rd Generation assay with a functional sensitivity of <=0.01 uIU/mL.  Marland Kitchen  Sodium 02/06/2017 135  135 - 145 mmol/L Final  . Potassium 02/06/2017 2.7* 3.5 - 5.1 mmol/L Final   Comment: CRITICAL RESULT CALLED TO, READ BACK BY AND VERIFIED WITH: M.WHITE,RN 02/06/17 0650 B.DAVIS   . Chloride 02/06/2017 107  101 - 111 mmol/L Final  . CO2 02/06/2017 19* 22 - 32 mmol/L Final  . Glucose, Bld 02/06/2017 100* 65 - 99 mg/dL Final  . BUN 02/06/2017 <5* 6 - 20 mg/dL Final  . Creatinine, Ser 02/06/2017 0.81  0.44 - 1.00 mg/dL Final  . Calcium 02/06/2017 6.7* 8.9 - 10.3 mg/dL Final  . Total Protein 02/06/2017 5.3* 6.5 - 8.1 g/dL Final  . Albumin 02/06/2017 2.5* 3.5 - 5.0 g/dL Final  . AST 02/06/2017 144* 15 - 41 U/L Final  . ALT 02/06/2017 101* 14 - 54 U/L Final  . Alkaline Phosphatase 02/06/2017 381* 38 - 126 U/L Final  . Total Bilirubin 02/06/2017 7.9* 0.3 - 1.2 mg/dL Final  . GFR calc non Af Amer 02/06/2017 >60  >60 mL/min Final  . GFR calc Af Amer 02/06/2017 >60  >60 mL/min Final   Comment: (NOTE) The eGFR has been calculated using the CKD EPI equation. This calculation has not been validated in all clinical situations. eGFR's persistently <60 mL/min signify possible Chronic Kidney Disease.   . Anion gap 02/06/2017 9  5 - 15 Final  . WBC 02/06/2017 14.3* 4.0 - 10.5 K/uL Final  . RBC 02/06/2017 3.07* 3.87 - 5.11 MIL/uL Final  . Hemoglobin 02/06/2017 9.2* 12.0 - 15.0 g/dL Final  . HCT 02/06/2017 28.5* 36.0 - 46.0 % Final  . MCV 02/06/2017 92.8  78.0 - 100.0 fL Final  . MCH 02/06/2017 30.0  26.0 - 34.0 pg Final  . MCHC  02/06/2017 32.3  30.0 - 36.0 g/dL Final  . RDW 02/06/2017 22.6* 11.5 - 15.5 % Final  . Platelets 02/06/2017 150  150 - 400 K/uL Final  . Prothrombin Time 02/06/2017 16.2* 11.4 - 15.2 seconds Final  . INR 02/06/2017 1.32   Final  . Ammonia 02/06/2017 49* 9 - 35 umol/L Final  . Sodium 02/06/2017 135  135 - 145 mmol/L Final  . Potassium 02/06/2017 3.0* 3.5 - 5.1 mmol/L Final  . Chloride 02/06/2017 107  101 - 111 mmol/L Final  . CO2 02/06/2017 21* 22 - 32 mmol/L Final  . Glucose, Bld 02/06/2017 134* 65 - 99 mg/dL Final  . BUN 02/06/2017 <5* 6 - 20 mg/dL Final  . Creatinine, Ser 02/06/2017 0.72  0.44 - 1.00 mg/dL Final  . Calcium 02/06/2017 6.9* 8.9 - 10.3 mg/dL Final  . GFR calc non Af Amer 02/06/2017 >60  >60 mL/min Final  . GFR calc Af Amer 02/06/2017 >60  >60 mL/min Final   Comment: (NOTE) The eGFR has been calculated using the CKD EPI equation. This calculation has not been validated in all clinical situations. eGFR's persistently <60 mL/min signify possible Chronic Kidney Disease.   . Anion gap 02/06/2017 7  5 - 15 Final  . Sodium 02/07/2017 134* 135 - 145 mmol/L Final  . Potassium 02/07/2017 2.9* 3.5 - 5.1 mmol/L Final  . Chloride 02/07/2017 106  101 - 111 mmol/L Final  . CO2 02/07/2017 21* 22 - 32 mmol/L Final  . Glucose, Bld 02/07/2017 99  65 - 99 mg/dL Final  . BUN 02/07/2017 <5* 6 - 20 mg/dL Final  . Creatinine, Ser 02/07/2017 0.68  0.44 - 1.00 mg/dL Final  . Calcium 02/07/2017 6.8* 8.9 - 10.3 mg/dL Final  .  Total Protein 02/07/2017 4.8* 6.5 - 8.1 g/dL Final  . Albumin 02/07/2017 2.5* 3.5 - 5.0 g/dL Final  . AST 02/07/2017 125* 15 - 41 U/L Final  . ALT 02/07/2017 90* 14 - 54 U/L Final  . Alkaline Phosphatase 02/07/2017 388* 38 - 126 U/L Final  . Total Bilirubin 02/07/2017 7.9* 0.3 - 1.2 mg/dL Final  . GFR calc non Af Amer 02/07/2017 >60  >60 mL/min Final  . GFR calc Af Amer 02/07/2017 >60  >60 mL/min Final   Comment: (NOTE) The eGFR has been calculated using the CKD EPI  equation. This calculation has not been validated in all clinical situations. eGFR's persistently <60 mL/min signify possible Chronic Kidney Disease.   . Anion gap 02/07/2017 7  5 - 15 Final  . WBC 02/07/2017 14.2* 4.0 - 10.5 K/uL Final  . RBC 02/07/2017 3.12* 3.87 - 5.11 MIL/uL Final  . Hemoglobin 02/07/2017 9.5* 12.0 - 15.0 g/dL Final  . HCT 02/07/2017 28.5* 36.0 - 46.0 % Final  . MCV 02/07/2017 91.3  78.0 - 100.0 fL Final  . MCH 02/07/2017 30.4  26.0 - 34.0 pg Final  . MCHC 02/07/2017 33.3  30.0 - 36.0 g/dL Final  . RDW 02/07/2017 22.9* 11.5 - 15.5 % Final  . Platelets 02/07/2017 162  150 - 400 K/uL Final  . Magnesium 02/07/2017 2.0  1.7 - 2.4 mg/dL Final  . WBC 02/09/2017 16.2* 4.0 - 10.5 K/uL Final  . RBC 02/09/2017 2.97* 3.87 - 5.11 MIL/uL Final  . Hemoglobin 02/09/2017 9.0* 12.0 - 15.0 g/dL Final  . HCT 02/09/2017 27.2* 36.0 - 46.0 % Final  . MCV 02/09/2017 91.6  78.0 - 100.0 fL Final  . MCH 02/09/2017 30.3  26.0 - 34.0 pg Final  . MCHC 02/09/2017 33.1  30.0 - 36.0 g/dL Final  . RDW 02/09/2017 23.5* 11.5 - 15.5 % Final  . Platelets 02/09/2017 177  150 - 400 K/uL Final  . Sodium 02/09/2017 135  135 - 145 mmol/L Final  . Potassium 02/09/2017 3.0* 3.5 - 5.1 mmol/L Final  . Chloride 02/09/2017 105  101 - 111 mmol/L Final  . CO2 02/09/2017 22  22 - 32 mmol/L Final  . Glucose, Bld 02/09/2017 111* 65 - 99 mg/dL Final  . BUN 02/09/2017 <5* 6 - 20 mg/dL Final  . Creatinine, Ser 02/09/2017 0.73  0.44 - 1.00 mg/dL Final  . Calcium 02/09/2017 6.7* 8.9 - 10.3 mg/dL Final  . Total Protein 02/09/2017 5.3* 6.5 - 8.1 g/dL Final  . Albumin 02/09/2017 2.4* 3.5 - 5.0 g/dL Final  . AST 02/09/2017 97* 15 - 41 U/L Final  . ALT 02/09/2017 62* 14 - 54 U/L Final  . Alkaline Phosphatase 02/09/2017 328* 38 - 126 U/L Final  . Total Bilirubin 02/09/2017 8.4* 0.3 - 1.2 mg/dL Final  . GFR calc non Af Amer 02/09/2017 >60  >60 mL/min Final  . GFR calc Af Amer 02/09/2017 >60  >60 mL/min Final   Comment:  (NOTE) The eGFR has been calculated using the CKD EPI equation. This calculation has not been validated in all clinical situations. eGFR's persistently <60 mL/min signify possible Chronic Kidney Disease.   . Anion gap 02/09/2017 8  5 - 15 Final    Assessment:  Melinda Tanner is a 46 y.o. 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  She initially presented in 2009 while living in [place] with multi-focal right breast cancer with positive lymph node(s) which was ER+, PR+(low), and HER2/neu+.  She received neoadjuvant chemotherapy (AC x 4 every 3 weeks followed by Taxol/Abraxane + carboplatin weekly x 12) without anti-HER2 treatment. BRCA1/2 testing was negative on 08/15/2016.  On 07/14/2008, she underwent bilateral mastectomies followed by reconstruction.  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   Zero of 11 lymph nodes on the right were positive for malignancy. 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 Deep margin was negative. Zero of 2 lymph nodes were positive for metastatic disease.  She received adjuvant radiation to the right breast.  She received adjuvant tamoxifen and Zoladex.  She could not tolerate symptoms of joint pain and tamoxifen was discontinued after 1-2 months.  Zoladex was continued for approximately 1.5 years.  She was diagnosed with brain metastases in 02/2011.  Pathology revealed ER+, PR+(low), and HER2/neu-.  She underwent resection followed by [facility].  On 03/09/2011, original breast tissue (09/03/2007) sent to [facility] testing.  Testing revealed ER+, PR+(borderline), and HER2/neu positive (different than initial testing).  She received Herceptin for 1 year beginning in 2013.  She then began Tykerb and Xeloda after completion of Herceptin (2014).  She was on extremely low, and probably sub-therapeutic, dosing of lapatinib + capecitabine.   She developed right supraclavicular  adenopathy in 2015. She was noted to have slow disease progression in the right frontal/parietal lesion with minimal presence of systemic disease on PET scans. Capecitabine + lapatinib were discontinued in 04/2014.   She underwent repeat craniotomy with resection of brain metastasis on 09/04/2014.  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 Tumor was ER positive (> 90%) PR negative and HER-2/neu negative (1+ IHC).  She started tamoxifen in 09/2014, but discontinued it secondary to pain in hip, back, and shoulder.  She restarted tamoxifen on 04/23/2015.  Tamoxifen was discontinued on 08/23/2015 secondary to progressive disease.  She received Zoladex on 04/30/2015.  She underwent laparoscopic bilateral oophorectomy on 05/18/2015.  She receives monthly Xgeva (began 04/30/2015; last 11/13/2016).  She is post-menopausal.  She received Faslodex from 08/23/2015 - 04/04/2016.  She received 7 cycles of Ibrance (09/20/2015 - 06/18/2016).  Patient received palliative radiation 30 Gy to the left humerus and lumbar spine from 08/25/2015 - 09/09/2015.  She was not eligible for a clinical trial.  She completed palliative radiation to T12-L1 of 3000 cGy (06/21/2016 - 07/04/2016).  She received palliative radiation to the left hip from 08/17/2016 - 08/30/2016.    Xeloda was cost prohibitive.  She does not wish to pursue navelbine or gemcitabine.  She received 3 cycles of Abraxane (10/09/2016 - 12/01/2016).  CA27.29 has been followed: 177.8 on 04/16/2015, 276.4 on 05/28/2015, 287.5 on 07/13/2015, 333.8 on 07/26/2015, 412.9 on 08/23/2015, 315.6 on 09/20/2015, 188.8 on 10/18/2015, 151.5 on 11/15/2015, 122.7 on 12/10/2015, 94.6 on 01/10/2016, 80.9 on 02/07/2016, 79 on 03/06/2016, 84.8 on 04/04/2016, 85.2 on 05/02/2016, 91.1 on 05/30/2016, 121 on 06/30/2016, 182.2 on 07/31/2016, 420.5 on 10/09/2016, 564.6 on 10/30/2016, 1322.6 on  11/27/2016, 3170.6 on 12/25/2016, 3568.2 on 12/27/2016, and 1851.1 on 01/16/2017.  Head MRI on 06/13/2016 revealed no evidence of residual or recurrent disease.  Head MRI on 10/27/2016 revealed 9 new small brain metastases since 05/2016. The largest was a 10 mm metastasis in the posterior right cerebellum. The smallest lesions were punctate, and 1 of these along the posterior inferior left cerebellar hemisphere was suspicious for leptomeningeal based disease.  There was no associated edema or mass effect.  She completed whole brain radiation (  11/08/2016 - 11/24/2016).  She is on a steroid taper.  Bone scan on 08/11/2016 revealed scattered foci of abnormal osseous tracer accumulation consistent with osseous metastatic disease with new sites of abnormal uptake in the thoracic spine at T5 in the posterior LEFT sixth rib.  Chest, abdomen, and pelvic CT on 11/29/2016 revealed multiple hepatic metastasis, a 10 mm adrenal metastasis, multiple abdominal nodes, 4.2 x 2.1 cm pancreatic head mass and a separate 1.1 cm lesion in the anterior pancreatic body, new 9 mm paraesophageal lymph node, trace pericardial thickening/fluid, 2 focal lung nodules (10 mm and 5 mm) in the RUL, and an enlarging blastic lesion of T5.  Abdomen and pelvic CT on 12/22/2016 revealed slight progression of hepatic metastatic disease.  There was stable to slightly smaller periportal, celiac axis and retrocrural lymph nodes.  There was stable retroperitoneal lymph nodes.  There was stable ill-defined possible lesion in the pancreatic head.  There was a slightly larger lesion in the pancreatic body and a new lesion in the head body junction region.  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  There was a small right pleural effusion with overlying atelectasis.  She received 2 cycles of eribulin (12/27/2016 - 01/16/2017).  Child class was B (7-9).  She received eribulin 0.7 mg/m2.  Bone density study on 05/24/2015  revealed a T-score of -0.8 in the right femoral neck (normal).  She is on a Fentanyl 25 mcg/hr patch.  Pain is well controlled.  Code status is FULL CODE.  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  Work-up on 10/25/2015 revealed a B12 of 141 (low).  B12 was 205 on 12/13/2015 with an MMA of 171 (normal) on 01/10/2016.  Ferritin was 43. Iron studies included a saturation of 13% and a TIBC of 354. TSH and folate were normal.  Reticulocyte count was 2.2%.  She declined B12 injections.  She started oral B12 1000 mcg on 11/05/2015.  She takes her B12 sporadically.  B12 level was 205 (low normal) on 12/13/2015.  Diet is good.  She denies any melena or hematochezia.  Symptomatically, she is fatigued. She has transitioned to Hospice services.  Patient has intermittent pain in her lower back that improves with position change and heat therapy. Her pain is controlled with Roxinol PRN. Patient is using Metocloprimide for nausea. Patient describes a decreased appetite. She is significantly jaundiced.  Exam reveals significant palpable cervical adenopathy. Patient has painful edema extending from the ankle to the knees bilaterally.  Bilirubin is 8.4.   Plan: 1.  Discuss interval hospitalization. 2.  Discuss hospice services. We will coordinate care with hospice services to ensure that patient's end of life care needs are met. 3.  Arrange in house notary to complete advanced directives documentation. 4.  RTC PRN.    Honor Loh, NP 02/13/2017, 11:20 AM   I saw and evaluated the patient, participating in the key portions of the service and reviewing pertinent diagnostic studies and records.  I reviewed the nurse practitioner's note and agree with the findings and the plan.  The assessment and plan were discussed with the patient.  Over 40 minutes were spent with the patient and her husband.  Multiple questions were asked by the patient and answered.   Nolon Stalls,  MD 02/13/2017,11:20 AM

## 2017-02-22 ENCOUNTER — Encounter: Payer: Self-pay | Admitting: Hematology and Oncology

## 2017-02-22 DIAGNOSIS — R0989 Other specified symptoms and signs involving the circulatory and respiratory systems: Secondary | ICD-10-CM | POA: Diagnosis not present

## 2017-02-22 DIAGNOSIS — C7951 Secondary malignant neoplasm of bone: Secondary | ICD-10-CM | POA: Diagnosis not present

## 2017-02-22 DIAGNOSIS — R17 Unspecified jaundice: Secondary | ICD-10-CM | POA: Diagnosis not present

## 2017-02-22 DIAGNOSIS — C50919 Malignant neoplasm of unspecified site of unspecified female breast: Secondary | ICD-10-CM | POA: Diagnosis not present

## 2017-02-22 DIAGNOSIS — Z0000 Encounter for general adult medical examination without abnormal findings: Secondary | ICD-10-CM | POA: Diagnosis not present

## 2017-02-22 DIAGNOSIS — R682 Dry mouth, unspecified: Secondary | ICD-10-CM | POA: Diagnosis not present

## 2017-02-22 DIAGNOSIS — R42 Dizziness and giddiness: Secondary | ICD-10-CM | POA: Diagnosis not present

## 2017-02-22 DIAGNOSIS — C7931 Secondary malignant neoplasm of brain: Secondary | ICD-10-CM | POA: Diagnosis not present

## 2017-02-22 DIAGNOSIS — R112 Nausea with vomiting, unspecified: Secondary | ICD-10-CM | POA: Diagnosis not present

## 2017-02-22 DIAGNOSIS — R531 Weakness: Secondary | ICD-10-CM | POA: Diagnosis not present

## 2017-02-27 ENCOUNTER — Telehealth: Payer: Self-pay | Admitting: *Deleted

## 2017-02-27 MED ORDER — FENTANYL 12 MCG/HR TD PT72: 12.5000 ug | MEDICATED_PATCH | TRANSDERMAL | 0 refills | Status: AC

## 2017-02-27 NOTE — Telephone Encounter (Signed)
Mitzi called and reports that patient is uncomfortable using liquid MS and that she has some Fentanyl 25 mcg patches on hand (2 of them) Asking if she can restart the Fentanyl Patches. Please advise

## 2017-02-27 NOTE — Telephone Encounter (Signed)
I spoke with Dr Mike Gip and appved Fentanyl 12.5 mcg if she will not take orals. Discussed with Mitzi who reports that patient will not take orals until she is in severe pain and then it takes forever to get it controlledand was willing to take Fentanyl. Fentanyl 12 mcg ordered

## 2017-02-27 NOTE — Telephone Encounter (Signed)
  How much short acting pain medications is she taking?  Fentanyl 25 mcg patch was written by Dr. Baruch Gouty back in 07/2016.  M

## 2017-03-05 ENCOUNTER — Encounter: Payer: Self-pay | Admitting: Hematology and Oncology

## 2017-03-20 DEATH — deceased

## 2018-09-15 IMAGING — CT NM PET TUM IMG RESTAG (PS) SKULL BASE T - THIGH
1 of 10 series · 1 of 25 positions shown · non-contrast
Comparison: PET-CT 08/20/2015.

CLINICAL DATA: Subsequent Treatment strategy for metastatic breast
cancer. Restaging examination.

EXAM:
NUCLEAR MEDICINE PET SKULL BASE TO THIGH
TECHNIQUE: 12.5 mCi F-18 FDG was injected intravenously. Full-ring PET imaging
was performed from the skull base to thigh after the radiotracer. CT
data was obtained and used for attenuation correction and anatomic
localization.
FASTING BLOOD GLUCOSE:  Value: 81 mg/dl

[Series 3: ct wb 5.0 b30f · axial · 5.0mm · 0.98mm/px · 1 of 290 slices shown]
[im 290/290  brain]
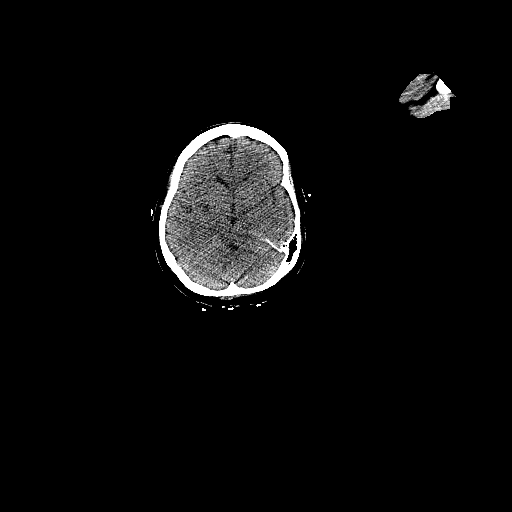

[1 of 25 positions shown; findings below may reference images not displayed]

FINDINGS: NECK

Previously noted right-sided cervical lymphadenopathy has decreased
in size and metabolism compared to the prior examination, now
demonstrating only low-level metabolic activity (SUVmax = 2.4),
indicative of a positive response to therapy. No new cervical or
supraclavicular hypermetabolic lymphadenopathy is otherwise noted.

CHEST

No hypermetabolic mediastinal or hilar nodes. No suspicious
pulmonary nodules on the CT scan. No acute consolidative airspace
disease. No pleural effusions. Status post bilateral modified
radical mastectomy and right axillary lymph node dissection, with
bilateral subpectoral breast implants in place. Left-sided
single-lumen subclavian Port-A-Cath with tip terminating in the
superior aspect of the right atrium.

ABDOMEN/PELVIS

No abnormal hypermetabolic activity within the liver, pancreas,
adrenal glands, or spleen. No hypermetabolic lymph nodes in the
abdomen or pelvis. Small calcified granulomas in the left lobe of
the liver. No pathologic dilatation of small bowel or colon. No
significant volume of ascites. No pneumoperitoneum. Uterus and
ovaries are unremarkable in appearance.

SKELETON

Compared to the prior examination there has been a considerable
increase in the number and size of numerous sclerotic lesions noted
throughout the visualized axial and appendicular skeleton. While
several of the previously noted hypermetabolic osseous lesions
demonstrate little to no residual hypermetabolism, and are now
diffusely sclerotic, other lesions do demonstrate increased
metabolic activity compared to the prior study. An example of an
improving lesion is the increasingly sclerotic lesion in the left
humeral head which currently demonstrates only low-level metabolic
activity (SUVmax = 2.3), previously with and SUV max of 5.5.
However, there are other lesions predominantly in the pelvis which
are increasingly hypermetabolic in the anterior aspect of the right
ilium (SUVmax = 3.7), in the supra-acetabular region of the right
ilium (SUVmax = 5.4)and in the left ischium (SUVmax = 3.6-4.5).
Several non hypermetabolic areas are also noted which demonstrate
dramatic sclerosis compared to the prior study, most evident in the
sacrum at S1.
IMPRESSION: 1. Overall, today's study demonstrates a mixed response to therapy.
Specifically, there has been improvement in right cervical
lymphadenopathy and in several osseous lesions, as discussed above.
However, several other osseous lesions in the pelvis demonstrate
increasing hypermetabolism compared to the prior study. As the
majority of the osseous lesions are increasingly sclerotic, some of
this hypermetabolism could simply reflect bony healing. Close
attention at time of future follow-up imaging is recommended.
2. No new extraskeletal metastatic disease is noted in the neck,
chest, abdomen or pelvis.
